# Patient Record
Sex: Female | Born: 1950 | Race: White | Hispanic: No | Smoking: Never smoker
Health system: Southern US, Community
[De-identification: ages and names within clinical notes are randomized; demographics above are authoritative.]

## PROBLEM LIST (undated history)

## (undated) DIAGNOSIS — K589 Irritable bowel syndrome without diarrhea: Secondary | ICD-10-CM

## (undated) DIAGNOSIS — K635 Polyp of colon: Secondary | ICD-10-CM

## (undated) DIAGNOSIS — M1990 Unspecified osteoarthritis, unspecified site: Secondary | ICD-10-CM

## (undated) DIAGNOSIS — J189 Pneumonia, unspecified organism: Secondary | ICD-10-CM

## (undated) DIAGNOSIS — R0602 Shortness of breath: Secondary | ICD-10-CM

## (undated) DIAGNOSIS — Z87442 Personal history of urinary calculi: Secondary | ICD-10-CM

## (undated) DIAGNOSIS — I639 Cerebral infarction, unspecified: Secondary | ICD-10-CM

## (undated) DIAGNOSIS — J309 Allergic rhinitis, unspecified: Secondary | ICD-10-CM

## (undated) DIAGNOSIS — Z9109 Other allergy status, other than to drugs and biological substances: Secondary | ICD-10-CM

## (undated) DIAGNOSIS — Z8719 Personal history of other diseases of the digestive system: Secondary | ICD-10-CM

## (undated) DIAGNOSIS — C801 Malignant (primary) neoplasm, unspecified: Secondary | ICD-10-CM

## (undated) DIAGNOSIS — R7303 Prediabetes: Secondary | ICD-10-CM

## (undated) DIAGNOSIS — R52 Pain, unspecified: Secondary | ICD-10-CM

## (undated) DIAGNOSIS — M545 Low back pain, unspecified: Secondary | ICD-10-CM

## (undated) DIAGNOSIS — E7800 Pure hypercholesterolemia, unspecified: Secondary | ICD-10-CM

## (undated) DIAGNOSIS — K219 Gastro-esophageal reflux disease without esophagitis: Secondary | ICD-10-CM

## (undated) DIAGNOSIS — F419 Anxiety disorder, unspecified: Secondary | ICD-10-CM

## (undated) DIAGNOSIS — I10 Essential (primary) hypertension: Secondary | ICD-10-CM

## (undated) DIAGNOSIS — R112 Nausea with vomiting, unspecified: Secondary | ICD-10-CM

## (undated) DIAGNOSIS — Z9889 Other specified postprocedural states: Secondary | ICD-10-CM

## (undated) DIAGNOSIS — T4145XA Adverse effect of unspecified anesthetic, initial encounter: Secondary | ICD-10-CM

## (undated) DIAGNOSIS — R911 Solitary pulmonary nodule: Secondary | ICD-10-CM

## (undated) DIAGNOSIS — J40 Bronchitis, not specified as acute or chronic: Secondary | ICD-10-CM

## (undated) HISTORY — PX: SHOULDER SURGERY: SHX246

## (undated) HISTORY — PX: TRIGGER FINGER RELEASE: SHX641

## (undated) HISTORY — PX: CHOLECYSTECTOMY: SHX55

## (undated) HISTORY — DX: Allergic rhinitis, unspecified: J30.9

## (undated) HISTORY — PX: ANKLE SURGERY: SHX546

## (undated) HISTORY — DX: Solitary pulmonary nodule: R91.1

## (undated) HISTORY — PX: COLONOSCOPY: SHX174

## (undated) HISTORY — DX: Low back pain: M54.5

## (undated) HISTORY — DX: Gastro-esophageal reflux disease without esophagitis: K21.9

## (undated) HISTORY — DX: Bronchitis, not specified as acute or chronic: J40

## (undated) HISTORY — PX: ROOT CANAL: SHX2363

## (undated) HISTORY — DX: Irritable bowel syndrome, unspecified: K58.9

## (undated) HISTORY — PX: OTHER SURGICAL HISTORY: SHX169

## (undated) HISTORY — DX: Unspecified osteoarthritis, unspecified site: M19.90

## (undated) HISTORY — DX: Low back pain, unspecified: M54.50

## (undated) HISTORY — PX: TUBAL LIGATION: SHX77

## (undated) HISTORY — DX: Cerebral infarction, unspecified: I63.9

## (undated) HISTORY — DX: Polyp of colon: K63.5

---

## 2000-01-22 ENCOUNTER — Other Ambulatory Visit: Admission: RE | Admit: 2000-01-22 | Discharge: 2000-01-22 | Payer: Self-pay | Admitting: Obstetrics and Gynecology

## 2001-01-26 ENCOUNTER — Encounter: Payer: Self-pay | Admitting: Internal Medicine

## 2001-04-12 ENCOUNTER — Other Ambulatory Visit: Admission: RE | Admit: 2001-04-12 | Discharge: 2001-04-12 | Payer: Self-pay | Admitting: Obstetrics and Gynecology

## 2001-05-19 ENCOUNTER — Emergency Department (HOSPITAL_COMMUNITY): Admission: EM | Admit: 2001-05-19 | Discharge: 2001-05-19 | Payer: Self-pay | Admitting: Emergency Medicine

## 2002-04-21 ENCOUNTER — Other Ambulatory Visit: Admission: RE | Admit: 2002-04-21 | Discharge: 2002-04-21 | Payer: Self-pay | Admitting: Obstetrics and Gynecology

## 2003-04-28 ENCOUNTER — Other Ambulatory Visit: Admission: RE | Admit: 2003-04-28 | Discharge: 2003-04-28 | Payer: Self-pay | Admitting: Obstetrics and Gynecology

## 2004-07-10 ENCOUNTER — Other Ambulatory Visit: Admission: RE | Admit: 2004-07-10 | Discharge: 2004-07-10 | Payer: Self-pay | Admitting: Obstetrics and Gynecology

## 2004-10-28 ENCOUNTER — Ambulatory Visit: Payer: Self-pay | Admitting: Gastroenterology

## 2005-10-29 ENCOUNTER — Ambulatory Visit: Payer: Self-pay | Admitting: Gastroenterology

## 2006-05-11 ENCOUNTER — Encounter: Payer: Self-pay | Admitting: Internal Medicine

## 2006-05-20 ENCOUNTER — Ambulatory Visit: Payer: Self-pay | Admitting: Gastroenterology

## 2006-06-18 ENCOUNTER — Ambulatory Visit: Payer: Self-pay | Admitting: Gastroenterology

## 2006-11-18 ENCOUNTER — Encounter: Payer: Self-pay | Admitting: Internal Medicine

## 2006-12-07 DIAGNOSIS — K219 Gastro-esophageal reflux disease without esophagitis: Secondary | ICD-10-CM

## 2006-12-07 DIAGNOSIS — J309 Allergic rhinitis, unspecified: Secondary | ICD-10-CM | POA: Insufficient documentation

## 2006-12-07 HISTORY — DX: Gastro-esophageal reflux disease without esophagitis: K21.9

## 2006-12-08 ENCOUNTER — Ambulatory Visit (HOSPITAL_COMMUNITY): Admission: RE | Admit: 2006-12-08 | Discharge: 2006-12-08 | Payer: Self-pay | Admitting: Specialist

## 2007-01-25 ENCOUNTER — Encounter: Admission: RE | Admit: 2007-01-25 | Discharge: 2007-01-25 | Payer: Self-pay | Admitting: Specialist

## 2007-10-12 ENCOUNTER — Encounter: Admission: RE | Admit: 2007-10-12 | Discharge: 2007-10-12 | Payer: Self-pay | Admitting: Neurosurgery

## 2007-10-19 ENCOUNTER — Ambulatory Visit: Payer: Self-pay | Admitting: Internal Medicine

## 2007-10-19 DIAGNOSIS — Z860100 Personal history of colon polyps, unspecified: Secondary | ICD-10-CM | POA: Insufficient documentation

## 2007-10-26 ENCOUNTER — Encounter: Admission: RE | Admit: 2007-10-26 | Discharge: 2007-10-26 | Payer: Self-pay | Admitting: Neurosurgery

## 2007-11-23 ENCOUNTER — Encounter (INDEPENDENT_AMBULATORY_CARE_PROVIDER_SITE_OTHER): Payer: Self-pay | Admitting: *Deleted

## 2007-11-23 ENCOUNTER — Ambulatory Visit: Payer: Self-pay | Admitting: Internal Medicine

## 2007-11-29 ENCOUNTER — Encounter: Payer: Self-pay | Admitting: Internal Medicine

## 2007-12-02 ENCOUNTER — Ambulatory Visit: Payer: Self-pay | Admitting: Internal Medicine

## 2007-12-02 ENCOUNTER — Ambulatory Visit (HOSPITAL_COMMUNITY): Admission: RE | Admit: 2007-12-02 | Discharge: 2007-12-02 | Payer: Self-pay | Admitting: Internal Medicine

## 2007-12-02 ENCOUNTER — Encounter: Payer: Self-pay | Admitting: Internal Medicine

## 2007-12-07 ENCOUNTER — Encounter: Payer: Self-pay | Admitting: Internal Medicine

## 2007-12-24 ENCOUNTER — Ambulatory Visit: Payer: Self-pay | Admitting: Internal Medicine

## 2007-12-24 DIAGNOSIS — R059 Cough, unspecified: Secondary | ICD-10-CM | POA: Insufficient documentation

## 2007-12-24 DIAGNOSIS — J329 Chronic sinusitis, unspecified: Secondary | ICD-10-CM | POA: Insufficient documentation

## 2007-12-24 DIAGNOSIS — R05 Cough: Secondary | ICD-10-CM | POA: Insufficient documentation

## 2007-12-24 DIAGNOSIS — J984 Other disorders of lung: Secondary | ICD-10-CM

## 2008-01-27 ENCOUNTER — Encounter: Admission: RE | Admit: 2008-01-27 | Discharge: 2008-01-27 | Payer: Self-pay | Admitting: Neurosurgery

## 2008-06-13 ENCOUNTER — Ambulatory Visit: Payer: Self-pay | Admitting: Cardiology

## 2008-07-18 ENCOUNTER — Encounter: Admission: RE | Admit: 2008-07-18 | Discharge: 2008-07-18 | Payer: Self-pay | Admitting: Neurosurgery

## 2008-07-18 ENCOUNTER — Ambulatory Visit: Payer: Self-pay | Admitting: Internal Medicine

## 2008-08-10 ENCOUNTER — Encounter: Admission: RE | Admit: 2008-08-10 | Discharge: 2008-08-10 | Payer: Self-pay | Admitting: Neurosurgery

## 2008-08-15 ENCOUNTER — Encounter: Admission: RE | Admit: 2008-08-15 | Discharge: 2008-08-15 | Payer: Self-pay | Admitting: Neurosurgery

## 2008-11-14 ENCOUNTER — Encounter: Admission: RE | Admit: 2008-11-14 | Discharge: 2008-11-14 | Payer: Self-pay | Admitting: Neurosurgery

## 2009-02-14 ENCOUNTER — Encounter: Admission: RE | Admit: 2009-02-14 | Discharge: 2009-02-14 | Payer: Self-pay | Admitting: Neurosurgery

## 2009-02-27 ENCOUNTER — Ambulatory Visit: Payer: Self-pay | Admitting: Internal Medicine

## 2009-03-17 HISTORY — PX: BACK SURGERY: SHX140

## 2009-03-20 ENCOUNTER — Encounter: Admission: RE | Admit: 2009-03-20 | Discharge: 2009-03-20 | Payer: Self-pay | Admitting: Internal Medicine

## 2009-04-02 ENCOUNTER — Telehealth: Payer: Self-pay | Admitting: Internal Medicine

## 2009-04-11 ENCOUNTER — Encounter: Admission: RE | Admit: 2009-04-11 | Discharge: 2009-04-11 | Payer: Self-pay | Admitting: Obstetrics and Gynecology

## 2009-04-12 ENCOUNTER — Encounter: Admission: RE | Admit: 2009-04-12 | Discharge: 2009-04-12 | Payer: Self-pay | Admitting: Neurosurgery

## 2009-04-24 ENCOUNTER — Encounter: Admission: RE | Admit: 2009-04-24 | Discharge: 2009-04-24 | Payer: Self-pay | Admitting: Neurosurgery

## 2009-05-28 ENCOUNTER — Inpatient Hospital Stay (HOSPITAL_COMMUNITY): Admission: RE | Admit: 2009-05-28 | Discharge: 2009-05-31 | Payer: Self-pay | Admitting: Neurosurgery

## 2009-06-19 ENCOUNTER — Encounter: Admission: RE | Admit: 2009-06-19 | Discharge: 2009-06-19 | Payer: Self-pay | Admitting: Neurosurgery

## 2009-07-19 ENCOUNTER — Encounter: Admission: RE | Admit: 2009-07-19 | Discharge: 2009-07-19 | Payer: Self-pay | Admitting: Neurosurgery

## 2009-07-23 ENCOUNTER — Telehealth: Payer: Self-pay | Admitting: Internal Medicine

## 2009-07-25 ENCOUNTER — Ambulatory Visit: Payer: Self-pay | Admitting: Internal Medicine

## 2009-07-26 ENCOUNTER — Encounter: Admission: RE | Admit: 2009-07-26 | Discharge: 2009-07-26 | Payer: Self-pay | Admitting: Neurosurgery

## 2009-09-13 ENCOUNTER — Ambulatory Visit: Payer: Self-pay | Admitting: Internal Medicine

## 2009-10-31 ENCOUNTER — Encounter: Admission: RE | Admit: 2009-10-31 | Discharge: 2009-10-31 | Payer: Self-pay | Admitting: Neurosurgery

## 2009-12-11 ENCOUNTER — Encounter: Admission: RE | Admit: 2009-12-11 | Discharge: 2009-12-11 | Payer: Self-pay | Admitting: Neurosurgery

## 2009-12-24 ENCOUNTER — Encounter: Admission: RE | Admit: 2009-12-24 | Discharge: 2009-12-24 | Payer: Self-pay | Admitting: Neurosurgery

## 2010-02-27 ENCOUNTER — Telehealth: Payer: Self-pay | Admitting: Internal Medicine

## 2010-04-07 ENCOUNTER — Encounter: Payer: Self-pay | Admitting: Internal Medicine

## 2010-04-16 NOTE — Assessment & Plan Note (Signed)
Summary: f/u ///kp   Visit Type:  Follow-up Copy to:  n/a Primary Provider/Referring Provider:  Theressa Millard, MD   CC:  Followup.  Pt states that she is doing well and denies any complaints today.Marland Kitchen  History of Present Illness: OV 12/24/2007: 60 year old female referred by Dr. Stevphen Rochester. REferred because of pulmonary nodules. 18 months  ago had dry cough of few weeks. Had CT chest that showed RML/Lingula plate like atelectasis and some small calcified nodules in 04/2006. [facility]2006 that was unchanged. Then, had repeat ct chest in 11/2007 (personally reviewed) that are all unchanged except new LL medial basal nodule in image 49. Therefore, referred. Original cough that got her the first scan resolved in 1-2 weeks in 04/2006. Since then, maintaing good health without problems except past 1 week has acut bronchitis. Otherwise, feel fine except for chronic sinus and baseline GERD, IBS. Of note, in July 1999 had "double pneumonia" treated as oupatient. Denies travel to [place]. REC: [place] [address] for folloup of nodules  OV 07/18/2008; Followup Nodules. Had CT 06/13/2008 at [place] [facility]. she says she took all her old scan to [facility] but they were not sent to radiologist for comparison. Moreover, she hsa not been mailed back her CT scans. She feels fine. No complaints. Here to review CT results  OV 09/13/2009: 1 year Followup for pulmonary nodules. She had  scan 07/25/2009. Nodules are all stable since 06/13/2008. She has no cough, dyspnea, wheeze, orthopnea or any symptoms. Only interim issue is back pain and she is s/p surgery byDr. Cramm. Otherwise, feels well  Preventive Screening-Counseling & Management  Alcohol-Tobacco     Smoking Status: never  Current Medications (verified): 1)  Nexium 40 Mg  Cpdr (Esomeprazole Magnesium) .... One Tablet By Mouth Once A Day 2)  Robaxin-750 750 Mg  Tabs (Methocarbamol) .... One Tablet By Mouth Once Daily 3)   Carafate 1 Gm  Tabs (Sucralfate) .Marland Kitchen.. 1 4 Times A Day As Needed 4)  Hydrochlorothiazide 25 Mg Tabs (Hydrochlorothiazide) .... Take 1 Tablet By Mouth Once A Day 5)  Prempro 0.3-1.5 Mg Tabs (Conj Estrog-Medroxyprogest Ace) .... One Tablet By Mouth Once Daily  Allergies (verified): No Known Drug Allergies  Past History:  Family History: Last updated: 07/18/2008 Family History of Arthritis Family History Diabetes 1st degree relative Family History Lung cancer + Family History of Cardiovascular disorder No FH of Colon Cancer:  Social History: Last updated: 02/27/2009 Occupation: retired Emergency planning/management officer Married Never Smoked Alcohol use-no Drug use-no Regular exercise-no Daily Caffeine Use  coke daily Never travelled to [place], [place], [place], [place], [place], [place], [place], [place], [place]  Risk Factors: Exercise: no (12/07/2006)  Risk Factors: Smoking Status: never (09/13/2009)  Past Medical History: Reviewed history from 02/27/2009 and no changes required. Allergic rhinitis x sinus drainage GERD "Chonic Bronchitis": Gets bronchitis every octobe Allergies Kidney Stones UTI's "Double Pneumonia": July 1999. Was not hospitalized. Reportedly seen on CXR. Treated by Dr. Stevphen Rochester. Says she took 1 month to get better Obesity - BMI 33 lost to 27 as of 02/2009  Past Surgical History: CB x2 Hand Surgery Shoulder Surgery Tubal ligation Colonoscopy Cholecystectomy ankle surgery Back surgery 3/11 per Dr Wynetta Emery  Family History: Reviewed history from 07/18/2008 and no changes required. Family History of Arthritis Family History Diabetes 1st degree relative Family History Lung cancer + Family History of Cardiovascular disorder No FH of Colon Cancer:  Social History: Reviewed history from 02/27/2009 and  no changes required. Occupation: retired Emergency planning/management officer Married Never Smoked Alcohol use-no Drug use-no Regular exercise-no Daily Caffeine Use  coke daily Never travelled to [employer], [place], [place], [place], [place], [place],  [place], [place], [place]  Review of Systems  The patient denies shortness of breath with activity, shortness of breath at rest, productive cough, non-productive cough, coughing up blood, chest pain, irregular heartbeats, acid heartburn, indigestion, loss of appetite, weight change, abdominal pain, difficulty swallowing, sore throat, tooth/dental problems, headaches, nasal congestion/difficulty breathing through nose, sneezing, itching, ear ache, anxiety, depression, hand/feet swelling, joint stiffness or pain, rash, change in color of mucus, and fever.    Vital Signs:  Patient profile:   60 year old female Weight:      174 pounds BMI:     29.06 O2 Sat:      97 % on Room air Temp:     98.0 degrees F oral Pulse rate:   80 / minute BP sitting:   124 / 78  (left arm)  Vitals Entered By: Vernie Murders (September 13, 2009 3:30 PM)  O2 Flow:  Room air  Physical Exam  General:  overweight   Head:  normocephalic and atraumatic Eyes:  PERRLA/EOM intact; conjunctiva and sclera clear Ears:  TMs intact and clear with normal canals Nose:  no deformity, discharge, inflammation, or lesions Mouth:  no deformity or lesionsthroat injected and Melampatti Class II.   Neck:  no masses, thyromegaly, or abnormal cervical nodes Chest Wall:  no deformities noted Lungs:  clear bilaterally to auscultation and percussion Heart:  regular rate and rhythm, S1, S2 without murmurs, rubs, gallops, or clicks Abdomen:  bowel sounds positive; abdomen soft and non-tender without masses, or organomegaly Msk:  no deformity or scoliosis noted with normal posture Pulses:  pulses normal Extremities:  no clubbing, cyanosis, edema, or deformity noted Neurologic:  CN II-XII grossly intact with normal reflexes, coordination, muscle strength and tone Skin:  intact without lesions or rashes Cervical Nodes:  no significant adenopathy Axillary Nodes:  no significant adenopathy Psych:  alert and cooperative; normal mood and affect; normal  attention span and concentration   CT of Chest  Procedure date:  07/25/2009  Findings:      Comparison: 06/13/2008   Findings: Triangular-shaped soft tissue in the anterior mediastinum is most likely thymic in origin.  No pathologically enlarged mediastinal or axillary lymph nodes.  Hilar regions are difficult to definitively evaluate without IV contrast.  Heart size normal. No pericardial effusion.   Scarring in the right middle lobe and lingula, as well as medial right lower lobe.  There are a few scattered tiny pulmonary nodules, which measure less than 4 mm in size.  These are stable in a slightly greater than 1-year interval from 06/13/2008.  No pleural fluid.  Airway is unremarkable.   Incidental imaging of the upper abdomen shows no acute findings. No worrisome lytic or sclerotic lesions.  There are degenerative changes in the spine.   IMPRESSION: Scattered tiny pulmonary nodules are unchanged in a slightly greater than 1-year interval from 06/13/2008, and are therefore considered benign.   Read By:  Reyes Ivan.,  M.D.     Released By:  Reyes Ivan.,  M.D  Comments:      independently rviwede and agree  Impression & Recommendations:  Problem # 1:  PULMONARY NODULE (ICD-518.89) Assessment Unchanged  #new LL medial basal nodule on im#49 on 11/[facility] -> not present in 06/13/2008 [facility] CT. PLAN: no further  followup #RML and Lingulaer atelectasis - 04/2006, 11/2006, and 11/[facility] ->  no change 06/13/2008 [facility] CT. Likely from prior pneumonia.  PLAN: NO furhter followup #small calcified nodules <78mm in [facility] -> not described in 06/13/2008 [facility] CT. PLAN: no furthe follwup #New small 0.2cm RUL nodule 06/13/2008 [facility] CT. -> Repat CT 07/25/2009: no change. PLAN: no further followup  plan no further folowup unless she has symptoms reassured  Orders: Radiology Referral (Radiology)  Orders: Est. Patient Level II  ([address]109)  Patient Instructions: 1)  continue to control your acid reflux 2)  no need for further ct scan chest or followup unless you have a problem or question

## 2010-04-16 NOTE — Progress Notes (Signed)
Summary: scheduel ct and appt  Phone Note Outgoing Call   Call placed by: Carron Curie CMA,  Jul 23, 2009 4:10 PM Call placed to: Patient Summary of Call: after reviewing schedule realized that pt has appt today to review CT but CT has not been done. i called pt and cancelled appt and sent order to [facility] to scheduel CT chest and f/u with MR after. Pt aware they will be contacting her to schedule appt.  Pt contact # [phone]. Initial call taken by: Carron Curie CMA,  Jul 23, 2009 4:11 PM

## 2010-04-16 NOTE — Progress Notes (Signed)
Summary: rx Carafate   Phone Note Call from Patient Call back at [phone]   Caller: Patient Call For: Dr. Leone Payor Reason for Call: Refill Medication Summary of Call: Pt needs for you to resend the rx Carafate to [facility]. They received the Nexium prescription but not the Carafate Initial call taken by: Karna Christmas,  April 02, 2009 9:41 AM    Prescriptions: CARAFATE 1 GM  TABS (SUCRALFATE) 1 4 times a day as needed  #360 x 3   Entered by:   Darcey Nora RN, CGRN   Authorized by:   Iva Boop MD, [facility]   Signed by:   Darcey Nora RN, CGRN on 04/02/2009   Method used:   Electronically to        [facility]* (mail-order)             ,          Ph: [phone]       Fax: [phone]   RxID:   [id number]

## 2010-04-18 NOTE — Progress Notes (Signed)
Summary: Nexium refill   Phone Note Call from Patient Call back at Home Phone [phone]   Call For: Dr Leone Payor Reason for Call: Refill Medication Summary of Call: Needs Nexium refill [facility] use [facility] [address] because she only has one week left and [facility] gives her such a hassle. Initial call taken by: Leanor Kail [facility],  February 27, 2010 12:05 PM  Follow-up for Phone Call        pt states she is doing well on Nexium and carafate.  Pt would like refills on both.  Pt states she still only uses carafate as needed and mostly around the holidays when she is stressed the most.  Advised pt I would call both to pharmacy. Follow-up by: Francee Piccolo CMA Duncan Dull),  February 27, 2010 2:05 PM    New/Updated Medications: NEXIUM 40 MG  CPDR (ESOMEPRAZOLE MAGNESIUM) one tablet by mouth once a day [BMN] CARAFATE 1 GM  TABS (SUCRALFATE) Take 1 tablet by mouth four times a day as needed Prescriptions: CARAFATE 1 GM  TABS (SUCRALFATE) Take 1 tablet by mouth four times a day as needed  #120 x 11   Entered by:   Francee Piccolo CMA (AAMA)   Authorized by:   Iva Boop MD, [facility]   Signed by:   Francee Piccolo CMA (AAMA) on 02/27/2010   Method used:   [facility]        [facility]  [phone]* (retail)       [address] [place], [place]  [address]       Ph: [phone] or [phone]       Fax: [phone]   RxID:   [id number] NEXIUM 40 MG  CPDR (ESOMEPRAZOLE MAGNESIUM) one tablet by mouth once a day Brand medically necessary #30 x 11   Entered by:   Francee Piccolo CMA (AAMA)   Authorized by:   Iva Boop MD, [facility]   Signed by:   Francee Piccolo CMA (AAMA) on 02/27/2010   Method used:   [facility] to        [facility]  [phone]* (retail)       [address] [place], [place]  [address]       Ph: [phone] or [phone]       Fax: [phone]   RxID:   [id number]

## 2010-06-10 LAB — CBC
HCT: 42.1 % (ref 36.0–46.0)
Platelets: 354 10*3/uL (ref 150–400)
RBC: 4.73 MIL/uL (ref 3.87–5.11)
WBC: 9.2 10*3/uL (ref 4.0–10.5)

## 2010-06-10 LAB — BASIC METABOLIC PANEL
BUN: 14 mg/dL (ref 6–23)
Chloride: 104 mEq/L (ref 96–112)
Creatinine, Ser: 0.68 mg/dL (ref 0.4–1.2)
GFR calc Af Amer: >60 mL/min (ref >60)
GFR calc non Af Amer: >60 mL/min (ref >60)
Potassium: 4 mEq/L (ref 3.5–5.1)

## 2010-06-10 LAB — TYPE AND SCREEN
ABO/RH(D): A NEG
Antibody Screen: NEGATIVE

## 2010-06-10 LAB — SURGICAL PCR SCREEN: Staphylococcus aureus: NEGATIVE

## 2010-08-02 NOTE — Assessment & Plan Note (Signed)
[facility]                         GASTROENTEROLOGY OFFICE NOTE   Angie, Olson                 MRN:          [id number]  DATE:06/18/2006                            DOB:          [birth year]    [name] says she is doing better.  She is taking her Nexium 40 mg daily  and Carafate, although she does not like taking the suspension.  She  likes the pills better.  She still has some suspension.  She quit taking  this, says it really did not seem to make a great deal of difference.  She also takes Zyrtec.  She still has some burping, and I wonder whether  some of this may be due to decreased motility or gastric emptying.  She  does have GERD, IBS, and a history of colon polyps.  She says salads  really bother her the most.  She cannot tolerate milk products too much  because it does not agree with her.  It gives her more irritable bowel  and probably has some lactose intolerance.  She is taking Nexium b.i.d.  My recommendation was that she continue the Nexium b.i.d., take Reglan 5  mg AC and at bedtime.  Take some Align.  We gave her samples of this.  I  was going to give her some Xifaxan, but I did not have any, so I thought  I would wait and continue the Carafate suspension.  I told her we ought  to get a gastric emptying study if her symptoms do not abate.  Also,  admitted that she drinks 3 or 4 regular soft drinks a day; told her  there was tremendous amounts of gas and I ask her to try some  simethicone and to decrease these cokes and so on.  She needs some  literature on intestinal gas.  She says she is better an I think this is  good.  I did do her endoscopic procedures.     Ulyess Mort, MD  Electronically Signed    SML/MedQ  DD: 06/18/2006  DT: 06/18/2006  Job #: [id number]

## 2010-08-02 NOTE — Assessment & Plan Note (Signed)
HEALTHCARE                         GASTROENTEROLOGY OFFICE NOTE   ENCARNACION, BOLE                 MRN:          [id number]  DATE:05/20/2006                            DOB:          Sep 28, 1950    PROBLEM:  Chest discomfort, reflux, and belching.   HISTORY:  Angie Olson is a 60 year old white female known to Dr. Victorino Dike,  who has a history of chronic GERD.  She is maintained on Nexium once  daily.  She last had endoscopy in November of 2002.  She was Angie Olson  dilated with a #56 at that time and had some moderately severe alkaline  gastritis noted.  She has had previous colonoscopy August of 2005, which  showed 1 small descending colon polyp and was a difficult exam due to  inadequate sedation.   The patient says that she has been having trouble over the past couple  of months with chest discomfort radiating through into her back.  This  has not been constant.  It is not necessarily worse postprandially, but  sometimes.  She describes it as a stabbing heavy feeling in her mid  chest.  She has had some nausea, but no vomiting.  She continues on her  Nexium once daily and has also been taking Carafate slurry twice daily.  She has some mild dysphagia, but has not had any episodes of  regurgitation.  No complaints of abdominal pain.  She does complain of a  lot of belching.  Her bowel movements have been fairly normal.   The patient, on further questioning, relates that she has had 4 courses  of antibiotics this winter for sinus infections, tooth infections, et  Karie Soda, and is currently on a course of Doxycycline for a mild  pneumonia.   CURRENT MEDICATIONS:  1. Nexium 40 mg p.o. daily.  2. Carafate slurry b.i.d.  3. Prempro 0.45/1.5 daily.  4. Xyzal 5 mg daily.  5. Doxycycline 100 daily.  6. No known drug allergies.   EXAM:  Well-developed white female in no acute distress.  Weight is 197, blood pressure 138/88, pulse is 84.  Pulmonary  clear to A  and P.  ABDOMEN:  Soft.  Bowel sounds are active.  She is nontender.  There is  no mass or hepatosplenomegaly.  RECTAL:  Not done at this time.   IMPRESSION:  1. Suspect acute esophagitis, probable candida esophagitis given      multiple recent courses of antibiotics.  2. Chronic gastroesophageal reflux disease.  3. Irritable bowel syndrome.  4. History of colon polyps, adenomatous.   PLAN:  1. Increase Nexium to 40 mg b.i.d. for 2 weeks.  2. Start Diflucan 100 mg p.o. daily x14 days.  3. The patient was advised to call back if she is not significantly      better after completing the Diflucan, we will schedule her for an      upper endoscopy.      Mike Gip, PA-C  Electronically Signed      Ulyess Mort, MD  Electronically Signed   AE/MedQ  DD: 05/21/2006  DT: 05/21/2006  Job #: [id number]   cc:  Theressa Millard, M.D.

## 2010-08-02 NOTE — Assessment & Plan Note (Signed)
[facility]                           GASTROENTEROLOGY OFFICE NOTE   Angie Olson, Angie Olson                       MRN:          [id number]  DATE:10/29/2005                            DOB:          [birth year]    Angie Olson says she has her same symptoms of irritable bowel syndrome which she  has had for years and some GERD.  She just needs refills on her medication.  [place].  She  wants to stay on her Nexium, says she has tried other things, also we  suggested some fiber.  She says she has trouble with her GERD if she eats  the wrong thing but basically is doing well.  She has had some complaints of  loose bowels over the last several months but they are regular.  She likes  the Carafate she takes.   MEDICATIONS:  Nexium daily, Carafate b.i.d., Flonase, hormone replacement,  and Zyrtec.   PHYSICAL EXAMINATION:  VITAL SIGNS:  She weighed 199, blood pressure 136/82,  pulse 68 and regular.  NECK, HEART, EXTREMITIES:  Unremarkable.   IMPRESSION:  1. Gastroesophageal reflux disease on Nexium.  2. Mild obesity.  3. History of anxiety/depression.  4. Known irritable bowel syndrome, now with a recent increased bowel      activity.   PLAN:  1. I gave her samples of medication and prescriptions.  2. I told her to add fiber to her diet and said this would help her IBS.      At any rate, she might be a patient some time in the future for our new      IBS medication that we are going to have available some time in the      near future.                                   Ulyess Mort, MD   SML/MedQ  DD:  10/29/2005  DT:  10/29/2005  Job #:  [id number]

## 2011-01-03 ENCOUNTER — Encounter: Payer: Self-pay | Admitting: Internal Medicine

## 2011-03-05 ENCOUNTER — Other Ambulatory Visit: Payer: Self-pay | Admitting: Internal Medicine

## 2011-03-13 ENCOUNTER — Telehealth: Payer: Self-pay | Admitting: Internal Medicine

## 2011-03-13 NOTE — Telephone Encounter (Signed)
Patient was given propofol last time, I have sent her to April to schedule in the [facility] on a propofol day.

## 2011-03-18 DIAGNOSIS — I639 Cerebral infarction, unspecified: Secondary | ICD-10-CM

## 2011-03-18 HISTORY — DX: Cerebral infarction, unspecified: I63.9

## 2011-03-20 ENCOUNTER — Encounter: Payer: Self-pay | Admitting: *Deleted

## 2011-03-20 ENCOUNTER — Emergency Department (HOSPITAL_COMMUNITY): Payer: 59

## 2011-03-20 ENCOUNTER — Emergency Department (HOSPITAL_COMMUNITY)
Admission: EM | Admit: 2011-03-20 | Discharge: 2011-03-20 | Disposition: A | Discharge status: Home / Self Care | Payer: 59 | Attending: Emergency Medicine | Admitting: Emergency Medicine

## 2011-03-20 ENCOUNTER — Other Ambulatory Visit: Payer: Self-pay

## 2011-03-20 DIAGNOSIS — Z79899 Other long term (current) drug therapy: Secondary | ICD-10-CM | POA: Insufficient documentation

## 2011-03-20 DIAGNOSIS — R5381 Other malaise: Secondary | ICD-10-CM | POA: Insufficient documentation

## 2011-03-20 DIAGNOSIS — R079 Chest pain, unspecified: Secondary | ICD-10-CM | POA: Insufficient documentation

## 2011-03-20 DIAGNOSIS — R209 Unspecified disturbances of skin sensation: Secondary | ICD-10-CM | POA: Insufficient documentation

## 2011-03-20 DIAGNOSIS — M7989 Other specified soft tissue disorders: Secondary | ICD-10-CM | POA: Insufficient documentation

## 2011-03-20 DIAGNOSIS — R5383 Other fatigue: Secondary | ICD-10-CM | POA: Insufficient documentation

## 2011-03-20 DIAGNOSIS — H5789 Other specified disorders of eye and adnexa: Secondary | ICD-10-CM | POA: Insufficient documentation

## 2011-03-20 DIAGNOSIS — I10 Essential (primary) hypertension: Secondary | ICD-10-CM | POA: Insufficient documentation

## 2011-03-20 HISTORY — DX: Anxiety disorder, unspecified: F41.9

## 2011-03-20 HISTORY — DX: Essential (primary) hypertension: I10

## 2011-03-20 HISTORY — DX: Gastro-esophageal reflux disease without esophagitis: K21.9

## 2011-03-20 LAB — CBC
MCH: 28.2 pg (ref 26.0–34.0)
MCHC: 34.8 g/dL (ref 30.0–36.0)
MCV: 81 fL (ref 78.0–100.0)
Platelets: 272 10*3/uL (ref 150–400)
RDW: 12.7 % (ref 11.5–15.5)

## 2011-03-20 LAB — BASIC METABOLIC PANEL
CO2: 31 mEq/L (ref 19–32)
Calcium: 10.3 mg/dL (ref 8.4–10.5)
Creatinine, Ser: 0.64 mg/dL (ref 0.50–1.10)
GFR calc non Af Amer: >90 mL/min (ref >90)
Glucose, Bld: 100 mg/dL — ABNORMAL HIGH (ref 70–99)

## 2011-03-20 MED ORDER — SODIUM CHLORIDE 0.9 % IV BOLUS (SEPSIS)
250.0000 mL | Freq: Once | INTRAVENOUS | Status: AC
  Administered 2011-03-20: 250 mL via INTRAVENOUS

## 2011-03-20 MED ORDER — SODIUM CHLORIDE 0.9 % IV SOLN: INTRAVENOUS | Status: DC

## 2011-03-20 MED ORDER — ASPIRIN EC 325 MG PO TBEC
325.0000 mg | DELAYED_RELEASE_TABLET | Freq: Once | ORAL | Status: AC
  Administered 2011-03-20: 325 mg via ORAL
  Filled 2011-03-20 (×2): qty 1

## 2011-03-20 MED ORDER — HYDROMORPHONE HCL 2 MG PO TABS: 2.0000 mg | ORAL_TABLET | ORAL | Status: AC | PRN

## 2011-03-20 MED ORDER — ONDANSETRON HCL 4 MG/2ML IJ SOLN
4.0000 mg | Freq: Once | INTRAMUSCULAR | Status: AC
  Administered 2011-03-20: 4 mg via INTRAVENOUS
  Filled 2011-03-20: qty 2

## 2011-03-20 MED ORDER — HYDROMORPHONE HCL PF 1 MG/ML IJ SOLN
1.0000 mg | Freq: Once | INTRAMUSCULAR | Status: AC
  Administered 2011-03-20: 1 mg via INTRAVENOUS
  Filled 2011-03-20: qty 1

## 2011-03-20 NOTE — ED Notes (Signed)
Pt states "when woke up my whole right hand was numb, it's still numb, the arm is not as numb as my hand, my hand feels like it's swelling up, have had cp & nausea:

## 2011-03-20 NOTE — Progress Notes (Signed)
*  PRELIMINARY RESULTS* Right upper extremity venous duplex completed. No evidence of DVT or superficial thrombus.Milta Deiters, [place] D 03/20/2011, 3:09 PM

## 2011-03-20 NOTE — ED Notes (Signed)
Patient complains of chest pain and right arm numbness beginning around 8am today, accompanied by nausea, NO shortness of breath, sweating, or vomiting. Pain is described as being a dull pressure. Pain began when patient got out of bed and was up waling

## 2011-03-20 NOTE — ED Provider Notes (Signed)
History     CSN: [id number]  Arrival date & time 03/20/11  1018   First MD Initiated Contact with Patient 03/20/11 1227      Chief Complaint  Patient presents with  . Numbness    of the right hand  . Chest Pain    (Consider location/radiation/quality/duration/timing/severity/associated sxs/prior treatment) Patient is a 61 y.o. female presenting with chest pain. The history is provided by the patient and the spouse.  Chest Pain The chest pain began 3 - 5 hours ago. Chest pain occurs constantly. The chest pain is unchanged. Associated with: Not made worse by any thing. At its most intense, the pain is at 4/10. The pain is currently at 3/10. The severity of the pain is moderate. The quality of the pain is described as burning and sharp. Radiates to: Questionable radiation in the right upper extremity due to the numbness. Exacerbated by: Nothing. Primary symptoms include fatigue. Pertinent negatives for primary symptoms include no fever, no cough, no wheezing, no abdominal pain, no nausea and no vomiting.  Associated symptoms include numbness.  Pertinent negatives for associated symptoms include no weakness. She tried nothing for the symptoms.  Her past medical history is significant for hypertension.  Pertinent negatives for past medical history include no CHF and no DVT.    chest pain started at 8:00 in the morning substernal. Patient does have a history of reflux disease and this is somewhat like that but not identical.   Patient also dissecting complaint and main complaint that brought her to the emergency department was right hand numbness that started at 6 this morning upon awaking. Glovelike numbness to her right hand that goes up to the distal forearm area. No pain in the elbow no pain in the shoulder some discomfort in the hand and wrist. No history of trauma or insect bite. No history of similar problem. Our patient is being evaluated by rheumatology for potential orthopedic type  syndrome to 2 some problems she's had with knee swelling and pain. Patient denies headache has no weakness numbness in any other area. Patient denies any weakness to her right hand.   Past Medical History  Diagnosis Date  . Hypertension   . Anxiety   . Acid reflux     Past Surgical History  Procedure Date  . Ankle surgery     left  . Shoulder surgery     left  . Thumb surgery   . Cholecystectomy   . Back surgery     No family history on file.  History  Substance Use Topics  . Smoking status: Never Smoker   . Smokeless tobacco: Not on file  . Alcohol Use: No    OB History    Grav Para Term Preterm Abortions TAB SAB Ect Mult Living                  Review of Systems  Constitutional: Positive for fatigue. Negative for fever.  HENT: Negative for congestion, neck pain and neck stiffness.   Eyes: Positive for redness. Negative for visual disturbance.  Respiratory: Negative for cough and wheezing.   Cardiovascular: Positive for chest pain. Negative for leg swelling.  Gastrointestinal: Negative for nausea, vomiting and abdominal pain.  Genitourinary: Negative for dysuria.  Musculoskeletal: Positive for joint swelling. Negative for back pain.  Skin: Negative for rash.  Neurological: Positive for numbness. Negative for speech difficulty, weakness and headaches.  Hematological: Does not bruise/bleed easily.  Psychiatric/Behavioral: Negative for confusion.    Allergies  Review of patient's allergies indicates no known allergies.  Home Medications   Current Outpatient Rx  Name Route Sig Dispense Refill  . ALPRAZOLAM 1 MG PO TABS Oral Take 0.5-1 mg by mouth 2 (two) times daily.      . ASPIRIN EC 81 MG PO TBEC Oral Take 81 mg by mouth daily.      Marland Kitchen CONJ ESTROG-MEDROXYPROGEST ACE 0.3-1.5 MG PO TABS Oral Take 1 tablet by mouth daily.      Marland Kitchen HYDROCHLOROTHIAZIDE 25 MG PO TABS Oral Take 25 mg by mouth daily.      Marland Kitchen NEXIUM 40 MG PO CPDR  TAKE ONE CAPSULE BY MOUTH EVERY DAY 30  capsule 0    Dispense as written.    PATIENT NEEDS AN APPOINTMENT! HAVE PATIENT CONTACT ...  . SUCRALFATE 1 G PO TABS Oral Take 1 g by mouth 4 (four) times daily.      Marland Kitchen HYDROMORPHONE HCL 2 MG PO TABS Oral Take 1 tablet (2 mg total) by mouth every 4 (four) hours as needed for pain. 20 tablet 0    BP 122/80  Pulse 73  Temp(Src) 98.6 F (37 C) (Oral)  Resp 17  Wt 180 lb (81.647 kg)  SpO2 95%  Physical Exam  Nursing note and vitals reviewed. Constitutional: She is oriented to person, place, and time. She appears well-developed and well-nourished.  HENT:  Head: Normocephalic and atraumatic.  Mouth/Throat: Oropharynx is clear and moist.  Eyes: Conjunctivae and EOM are normal. Pupils are equal, round, and reactive to light.       Mild conjunctival redness to the right eye no discharge  Neck: Normal range of motion. Neck supple.  Cardiovascular: Normal rate, regular rhythm, normal heart sounds and intact distal pulses.   No murmur heard. Pulmonary/Chest: Effort normal and breath sounds normal. She has no wheezes.  Abdominal: Soft. Bowel sounds are normal. There is no tenderness.  Musculoskeletal: Normal range of motion.       Extremities significant for right hand with diffuse mild swelling no pitting edema involving all 5 fingers motor strength is 5 out of 5 and intact subjectively no sensory feeling however patient does experience some pain. No swelling of the elbow or shoulder proximal forearm normal in appearance. Radial pulses 2+ excellent capillary  refill to fingertips  Lymphadenopathy:    She has no cervical adenopathy.  Neurological: She is alert and oriented to person, place, and time. No cranial nerve deficit. She exhibits normal muscle tone. Coordination normal.       As stated in the musculoskeletal part of exam  Skin: Skin is warm. No rash noted. No erythema.    ED Course  Procedures (including critical care time)  Labs Reviewed  BASIC METABOLIC PANEL - Abnormal;  Notable for the following:    Potassium 3.4 (*)    Glucose, Bld 100 (*)    All other components within normal limits  TROPONIN I  CBC  D-DIMER, QUANTITATIVE   Dg Chest 2 View  03/20/2011  *RADIOLOGY REPORT*  Clinical Data: Chest pain.  Right-sided pain.  Hand numbness. History of hypertension.  CHEST - 2 VIEW  Comparison: 05/23/2009  Findings: Cardiomediastinal silhouette is within normal limits. The lungs are free of focal consolidations and pleural effusions. No evidence for pulmonary edema.  Degenerative changes are seen in the spine.  Surgical clips are present in the right upper quadrant of the abdomen.  IMPRESSION: No evidence for acute cardiopulmonary abnormality.  Original Report Authenticated By: Patterson Hammersmith,  M.D.   Ct Head Wo Contrast  03/20/2011  *RADIOLOGY REPORT*  Clinical Data: The numbness and chest pain.  Right arm numbness. Headaches and dizziness.  CT HEAD WITHOUT CONTRAST  Technique:  Contiguous axial images were obtained from the base of the skull through the vertex without contrast.  Comparison: CT head without contrast 03/20/2009.  Findings: No acute intracranial abnormality is present. Specifically, there is no evidence for acute infarct, hemorrhage, mass, hydrocephalus, or extra-axial fluid collection.  The paranasal sinuses and mastoid air cells are clear.  The globes and orbits are intact.  The osseous skull is intact.  IMPRESSION: Negative CT of the head.  Original Report Authenticated By: Jamesetta Orleans. MATTERN, M.D.     Date: 03/20/2011  Rate: 81  Rhythm: normal sinus rhythm  QRS Axis: normal  Intervals: normal  ST/T Wave abnormalities: nonspecific T wave changes  Conduction Disutrbances:none  Narrative Interpretation:   Old EKG Reviewed: unchanged Unchanged from 05/23/2009.  Results for orders placed during the hospital encounter of 03/20/11  TROPONIN I      Component Value Range   Troponin I <0.30  <0.30 (ng/mL)  BASIC METABOLIC PANEL      Component  Value Range   Sodium 141  135 - 145 (mEq/L)   Potassium 3.4 (*) 3.5 - 5.1 (mEq/L)   Chloride 101  96 - 112 (mEq/L)   CO2 31  19 - 32 (mEq/L)   Glucose, Bld 100 (*) 70 - 99 (mg/dL)   BUN 13  6 - 23 (mg/dL)   Creatinine, Ser 1.61  0.50 - 1.10 (mg/dL)   Calcium 09.6  8.4 - 10.5 (mg/dL)   GFR calc non Af Amer >90  >90 (mL/min)   GFR calc Af Amer >90  >90 (mL/min)  CBC      Component Value Range   WBC 5.7  4.0 - 10.5 (K/uL)   RBC 4.90  3.87 - 5.11 (MIL/uL)   Hemoglobin 13.8  12.0 - 15.0 (g/dL)   HCT 04.5  40.9 - 81.1 (%)   MCV 81.0  78.0 - 100.0 (fL)   MCH 28.2  26.0 - 34.0 (pg)   MCHC 34.8  30.0 - 36.0 (g/dL)   RDW 91.4  78.2 - 95.6 (%)   Platelets 272  150 - 400 (K/uL)  D-DIMER, QUANTITATIVE      Component Value Range   D-Dimer, Quant <0.22  0.00 - 0.48 (ug/mL-FEU)     1. Chest pain   2. Hand swelling    Preliminary results for right upper extremity venous duplex scan is as follows: No evidence of DVT or superficial thrombus.   MDM   Patient presented with 2 distinct complaints most likely not related. The first complaint was right hand numbness globelike up to distal forearm onset at 6 in the morning. This is associated with hand swelling and some mild discomfort. No elbow or shoulder pain no neck pain. Due to the swelling was concerned that this could be related to an upper tremor any DVT d-dimer was negative and Doppler studies were negative. Patient has had evaluation by rheumatology for questionable connective tissue type of arthritis that is still being worked up for swelling therefore may be related to that. Doubt due to the fact that her swelling that this is related to any strokelike symptoms. The second complaint was chest pain that started at 8:00 in the morning with substernal similar to her reflux disease but not exactly no radiation of pain except for maybe the numbness in  the right upper extremity no nausea no vomiting no shortness of breath. Workup for that was  significant for no acute changes on EKG troponin was negative chest x-ray no pneumonia no pneumothorax in addition negative d-dimer makes pulmonary embolism unlikely. Findings are not consistent with an acute cardiac event.        Shelda Jakes, MD 03/20/11 [phone]

## 2011-03-20 NOTE — ED Provider Notes (Signed)
11:45 AM  Date: 03/20/2011  Rate:81  Rhythm: normal sinus rhythm  QRS Axis: normal  Intervals: normal  ST/T Wave abnormalities: nonspecific T wave changes  Conduction Disutrbances:none  Narrative Interpretation: Borderline EKG  Old EKG Reviewed: unchanged    Carleene Cooper III, MD 03/20/11 1147

## 2011-03-20 NOTE — ED Notes (Signed)
Pt presenting to ed with c/o chest pain and some nausea pt denies vomiting. Pt also states that she had some right arm numbness. Pt with no slurred speech noted or facial droop. Pt oob ambulating to restroom. Pt's family is at bedside

## 2011-03-20 NOTE — ED Notes (Signed)
Patient verbalized understanding of discharge instructions and prescriptions. 

## 2011-03-25 ENCOUNTER — Other Ambulatory Visit: Payer: Self-pay | Admitting: Internal Medicine

## 2011-03-25 DIAGNOSIS — G459 Transient cerebral ischemic attack, unspecified: Secondary | ICD-10-CM

## 2011-03-29 ENCOUNTER — Other Ambulatory Visit: Payer: Self-pay | Admitting: Internal Medicine

## 2011-03-31 ENCOUNTER — Ambulatory Visit
Admission: RE | Admit: 2011-03-31 | Discharge: 2011-03-31 | Disposition: A | Discharge status: Home / Self Care | Payer: 59 | Source: Ambulatory Visit | Attending: Internal Medicine | Admitting: Internal Medicine

## 2011-03-31 DIAGNOSIS — G459 Transient cerebral ischemic attack, unspecified: Secondary | ICD-10-CM

## 2011-04-01 ENCOUNTER — Other Ambulatory Visit: Payer: Self-pay | Admitting: Internal Medicine

## 2011-04-29 ENCOUNTER — Other Ambulatory Visit: Payer: Self-pay | Admitting: Internal Medicine

## 2011-04-29 ENCOUNTER — Encounter: Payer: Self-pay | Admitting: Internal Medicine

## 2011-05-26 ENCOUNTER — Encounter: Payer: Self-pay | Admitting: Internal Medicine

## 2011-05-26 ENCOUNTER — Ambulatory Visit (AMBULATORY_SURGERY_CENTER): Payer: 59 | Admitting: *Deleted

## 2011-05-26 VITALS — Ht 65.0 in | Wt 186.2 lb

## 2011-05-26 DIAGNOSIS — Z1211 Encounter for screening for malignant neoplasm of colon: Secondary | ICD-10-CM

## 2011-05-26 DIAGNOSIS — Z86010 Personal history of colonic polyps: Secondary | ICD-10-CM

## 2011-05-26 MED ORDER — PEG-KCL-NACL-NASULF-NA ASC-C 100 G PO SOLR: ORAL | Status: DC

## 2011-05-26 NOTE — Progress Notes (Signed)
Changed patient's colonoscopy to 06-26-11 with propofol. Patient's last colon 2009 with propofol. Patient agrees. Pt states during 1 st colon she did wake up with discomfort.

## 2011-06-09 ENCOUNTER — Other Ambulatory Visit: Payer: 59 | Admitting: Internal Medicine

## 2011-06-26 ENCOUNTER — Encounter: Payer: Self-pay | Admitting: Internal Medicine

## 2011-06-26 ENCOUNTER — Ambulatory Visit (AMBULATORY_SURGERY_CENTER): Payer: 59 | Admitting: Internal Medicine

## 2011-06-26 VITALS — BP 122/79 | HR 78 | Temp 96.5°F | Resp 18 | Ht 65.0 in | Wt 186.0 lb

## 2011-06-26 DIAGNOSIS — D126 Benign neoplasm of colon, unspecified: Secondary | ICD-10-CM

## 2011-06-26 DIAGNOSIS — Z1211 Encounter for screening for malignant neoplasm of colon: Secondary | ICD-10-CM

## 2011-06-26 DIAGNOSIS — Z86010 Personal history of colonic polyps: Secondary | ICD-10-CM

## 2011-06-26 MED ORDER — SODIUM CHLORIDE 0.9 % IV SOLN: 500.0000 mL | INTRAVENOUS | Status: DC

## 2011-06-26 NOTE — Progress Notes (Signed)
Patient did not experience any of the following events: a burn prior to discharge; a fall within the facility; wrong site/side/patient/procedure/implant event; or a hospital transfer or hospital admission upon discharge from the facility. (G8907) Patient did not have preoperative order for IV antibiotic SSI prophylaxis. (G8918)  

## 2011-06-26 NOTE — Op Note (Signed)
[facility] [address] [place], [place]  [address]  COLONOSCOPY PROCEDURE REPORT  PATIENT:  Angie Olson, Angie Olson  MR#:  [id number] BIRTHDATE:  Oct 16, 1950, 60 yrs. old  GENDER:  female ENDOSCOPIST:  Iva Boop, MD, [facility]  PROCEDURE DATE:  06/26/2011 PROCEDURE:  Colonoscopy with snare polypectomy ASA CLASS:  Class III INDICATIONS:  surveillance and high-risk screening, history of pre-cancerous (adenomatous) colon polyps 2005 - diminutive adenoma (index) 2009 - 4 tubular adenomas max 8 mm MEDICATIONS:   MAC sedation, administered by CRNA, propofol (Diprivan) 400 mg IV  DESCRIPTION OF PROCEDURE:   After the risks benefits and alternatives of the procedure were thoroughly explained, informed consent was obtained.  Digital rectal exam was performed and revealed no abnormalities.   The LB 180AL [id number] endoscope was introduced through the anus and advanced to the cecum, which was identified by both the appendix and ileocecal valve, without limitations.  The quality of the prep was excellent, using MoviPrep.  The instrument was then slowly withdrawn as the colon was fully examined. <<PROCEDUREIMAGES>>  FINDINGS:  There were multiple polyps identified and removed. 6 polyps. 3 transverse, 1 ascending and 2 sigmoid. Largest polyp 7mm in ascending. Smallest 4 mm transverse (not recovered). All removed via cold snare.  This was otherwise a normal examination of the colon.   Retroflexed views in the rectum revealed no abnormalities.    The time to cecum = 6:14 minutes. The scope was then withdrawn in 13:24 minutes from the cecum and the procedure completed. COMPLICATIONS:  None ENDOSCOPIC IMPRESSION: 1) Polyps 6 removed, 5 recovered 2) Otherwise normal examination, excellent prep 3) Personal history of adenomas 2005(1) and 2009 (4)  REPEAT EXAM:  In for Colonoscopy, pending biopsy results.  Iva Boop, MD, [facility]  CC:  The Patient  n. eSIGNED:   Iva Boop at  06/26/2011 02:25 PM  Gladys Damme, [id number]

## 2011-06-26 NOTE — Patient Instructions (Signed)
Impressions/recommendations:  Polyps (handout given) Repeat colonoscopy pending pathology results.  Resume medications as you were taking them prior to your procedure.  YOU HAD AN ENDOSCOPIC PROCEDURE TODAY AT THE [facility]: Refer to the procedure report that was given to you for any specific questions about what was found during the examination.  If the procedure report does not answer your questions, please call your gastroenterologist to clarify.  If you requested that your care partner not be given the details of your procedure findings, then the procedure report has been included in a sealed envelope for you to review at your convenience later.  YOU SHOULD EXPECT: Some feelings of bloating in the abdomen. Passage of more gas than usual.  Walking can help get rid of the air that was put into your GI tract during the procedure and reduce the bloating. If you had a lower endoscopy (such as a colonoscopy or flexible sigmoidoscopy) you may notice spotting of blood in your stool or on the toilet paper. If you underwent a bowel prep for your procedure, then you may not have a normal bowel movement for a few days.  DIET: Your first meal following the procedure should be a light meal and then it is ok to progress to your normal diet.  A half-sandwich or bowl of soup is an example of a good first meal.  Heavy or fried foods are harder to digest and may make you feel nauseous or bloated.  Likewise meals heavy in dairy and vegetables can cause extra gas to form and this can also increase the bloating.  Drink plenty of fluids but you should avoid alcoholic beverages for 24 hours.  ACTIVITY: Your care partner should take you home directly after the procedure.  You should plan to take it easy, moving slowly for the rest of the day.  You can resume normal activity the day after the procedure however you should NOT DRIVE or use heavy machinery for 24 hours (because of the sedation medicines used  during the test).    SYMPTOMS TO REPORT IMMEDIATELY: A gastroenterologist can be reached at any hour.  During normal business hours, 8:30 AM to 5:00 PM Monday through Friday, call [phone].  After hours and on weekends, please call the GI answering service at [phone] who will take a message and have the physician on call contact you.   Following lower endoscopy (colonoscopy or flexible sigmoidoscopy):  Excessive amounts of blood in the stool  Significant tenderness or worsening of abdominal pains  Swelling of the abdomen that is new, acute  Fever of 100F or higher  Following upper endoscopy (EGD)  Vomiting of blood or coffee ground material  New chest pain or pain under the shoulder blades  Painful or persistently difficult swallowing  New shortness of breath  Fever of 100F or higher  Black, tarry-looking stools  FOLLOW UP: If any biopsies were taken you will be contacted by phone or by letter within the next 1-3 weeks.  Call your gastroenterologist if you have not heard about the biopsies in 3 weeks.  Our staff will call the home number listed on your records the next business day following your procedure to check on you and address any questions or concerns that you may have at that time regarding the information given to you following your procedure. This is a courtesy call and so if there is no answer at the home number and we have not heard from you through the emergency  physician on call, we will assume that you have returned to your regular daily activities without incident.  SIGNATURES/CONFIDENTIALITY: You and/or your care partner have signed paperwork which will be entered into your electronic medical record.  These signatures attest to the fact that that the information above on your After Visit Summary has been reviewed and is understood.  Full responsibility of the confidentiality of this discharge information lies with you and/or your care-partner.

## 2011-06-27 ENCOUNTER — Telehealth: Payer: Self-pay | Admitting: *Deleted

## 2011-06-27 NOTE — Telephone Encounter (Signed)
No answer.  Left message to call office if questions or concerns. 

## 2011-07-02 ENCOUNTER — Encounter: Payer: Self-pay | Admitting: Internal Medicine

## 2011-07-02 NOTE — Progress Notes (Signed)
Quick Note:  5 adenomas (6th polyp not recovered) Repeat colon about 06/2014 ______

## 2012-09-14 DIAGNOSIS — R112 Nausea with vomiting, unspecified: Secondary | ICD-10-CM

## 2012-09-14 DIAGNOSIS — Z9889 Other specified postprocedural states: Secondary | ICD-10-CM

## 2012-09-14 HISTORY — DX: Other specified postprocedural states: Z98.890

## 2012-09-14 HISTORY — DX: Nausea with vomiting, unspecified: R11.2

## 2012-10-05 ENCOUNTER — Other Ambulatory Visit: Payer: Self-pay | Admitting: Orthopedic Surgery

## 2012-10-05 ENCOUNTER — Encounter (HOSPITAL_COMMUNITY): Payer: Self-pay | Admitting: Pharmacy Technician

## 2012-10-05 MED ORDER — DEXAMETHASONE SODIUM PHOSPHATE 10 MG/ML IJ SOLN: 10.0000 mg | Freq: Once | INTRAMUSCULAR | Status: DC

## 2012-10-05 NOTE — Progress Notes (Signed)
Preoperative surgical orders have been place into the Epic hospital system for Angie Olson on 10/05/2012, 6:25 PM  by Patrica Duel for surgery on 10/13/2012.  Preop Knee Scope orders including IV Tylenol and IV Decadron as long as there are no contraindications to the above medications. Avel Peace, PA-C

## 2012-10-05 NOTE — Progress Notes (Signed)
Need orders please - pt coming for preop MON 10/11/12 - thank you 

## 2012-10-11 ENCOUNTER — Encounter (HOSPITAL_COMMUNITY)
Admission: RE | Admit: 2012-10-11 | Discharge: 2012-10-11 | Disposition: A | Discharge status: Home / Self Care | Payer: 59 | Source: Ambulatory Visit | Attending: Orthopedic Surgery | Admitting: Orthopedic Surgery

## 2012-10-11 ENCOUNTER — Ambulatory Visit (HOSPITAL_COMMUNITY)
Admission: RE | Admit: 2012-10-11 | Discharge: 2012-10-11 | Disposition: A | Discharge status: Home / Self Care | Payer: 59 | Source: Ambulatory Visit | Attending: Orthopedic Surgery | Admitting: Orthopedic Surgery

## 2012-10-11 ENCOUNTER — Encounter (HOSPITAL_COMMUNITY): Payer: Self-pay

## 2012-10-11 DIAGNOSIS — Z01812 Encounter for preprocedural laboratory examination: Secondary | ICD-10-CM | POA: Insufficient documentation

## 2012-10-11 DIAGNOSIS — Z01810 Encounter for preprocedural cardiovascular examination: Secondary | ICD-10-CM | POA: Insufficient documentation

## 2012-10-11 DIAGNOSIS — I700 Atherosclerosis of aorta: Secondary | ICD-10-CM | POA: Insufficient documentation

## 2012-10-11 DIAGNOSIS — Z01818 Encounter for other preprocedural examination: Secondary | ICD-10-CM | POA: Insufficient documentation

## 2012-10-11 HISTORY — DX: Other specified postprocedural states: Z98.890

## 2012-10-11 HISTORY — DX: Pneumonia, unspecified organism: J18.9

## 2012-10-11 HISTORY — DX: Personal history of other diseases of the digestive system: Z87.19

## 2012-10-11 HISTORY — DX: Unspecified osteoarthritis, unspecified site: M19.90

## 2012-10-11 HISTORY — DX: Pain, unspecified: R52

## 2012-10-11 HISTORY — DX: Nausea with vomiting, unspecified: R11.2

## 2012-10-11 LAB — BASIC METABOLIC PANEL
Calcium: 10 mg/dL (ref 8.4–10.5)
Chloride: 100 mEq/L (ref 96–112)
Creatinine, Ser: 0.7 mg/dL (ref 0.50–1.10)
GFR calc Af Amer: >90 mL/min (ref >90)

## 2012-10-11 LAB — CBC
MCV: 84.4 fL (ref 78.0–100.0)
Platelets: 355 10*3/uL (ref 150–400)
RDW: 14.2 % (ref 11.5–15.5)
WBC: 8.4 10*3/uL (ref 4.0–10.5)

## 2012-10-11 NOTE — Patient Instructions (Addendum)
YOUR SURGERY IS SCHEDULED AT [facility]  ON:  Wednesday  7/30  REPORT TO [facility] AT: 9:15 AM      PHONE # FOR SHORT STAY IS [phone]  DO NOT EAT OR DRINK ANYTHING AFTER MIDNIGHT THE NIGHT BEFORE YOUR SURGERY.  YOU MAY BRUSH YOUR TEETH, RINSE OUT YOUR MOUTH--BUT NO WATER, NO FOOD, NO CHEWING GUM, NO MINTS, NO CANDIES, NO CHEWING TOBACCO.  PLEASE TAKE THE FOLLOWING MEDICATIONS THE AM OF YOUR SURGERY WITH A FEW SIPS OF WATER:  ATORVASTATIN, NEXIUM, PREDNISONE.  MAY USE NASONEX   DO NOT BRING VALUABLES, MONEY, CREDIT CARDS.  DO NOT WEAR JEWELRY, MAKE-UP, NAIL POLISH AND NO METAL PINS OR CLIPS IN YOUR HAIR. CONTACT LENS, DENTURES / PARTIALS, GLASSES SHOULD NOT BE WORN TO SURGERY AND IN MOST CASES-HEARING AIDS WILL NEED TO BE REMOVED.  BRING YOUR GLASSES CASE, ANY EQUIPMENT NEEDED FOR YOUR CONTACT LENS. FOR PATIENTS ADMITTED TO THE HOSPITAL--CHECK OUT TIME THE DAY OF DISCHARGE IS 11:00 AM.  ALL INPATIENT ROOMS ARE PRIVATE - WITH BATHROOM, TELEPHONE, TELEVISION AND WIFI INTERNET.  IF YOU ARE BEING DISCHARGED THE SAME DAY OF YOUR SURGERY--YOU CAN NOT DRIVE YOURSELF HOME--AND SHOULD NOT GO HOME ALONE BY TAXI OR BUS.  NO DRIVING OR OPERATING MACHINERY FOR 24 HOURS FOLLOWING ANESTHESIA / PAIN MEDICATIONS.  PLEASE MAKE ARRANGEMENTS FOR SOMEONE TO BE WITH YOU AT HOME THE FIRST 24 HOURS AFTER SURGERY. RESPONSIBLE DRIVER'S NAME  PT'S HUSBAND JAMES Martorelli                                               PHONE #   [phone]                            PLEASE READ OVER ANY  FACT SHEETS THAT YOU WERE GIVEN: INCENTIVE SPIROMETER INFORMATION. FAILURE TO FOLLOW THESE INSTRUCTIONS MAY RESULT IN THE CANCELLATION OF YOUR SURGERY.   PATIENT SIGNATURE_________________________________

## 2012-10-11 NOTE — Pre-Procedure Instructions (Signed)
EKG AND CXR WERE DONE TODAY - PREOP AT [facility]. 

## 2012-10-12 NOTE — H&P (Signed)
CC- Angie Olson is a 62 y.o. female who presents with right knee pain.  HPI- . Knee Pain: Patient presents with knee pain involving the  right knee. Onset of the symptoms was several months ago. Inciting event: slipped and twisted knee. Current symptoms include giving out, pain located medially and swelling. Pain is aggravated by any weight bearing, lateral movements, pivoting, rising after sitting, standing and walking.  Patient has had no prior knee problems. Evaluation to date: MRI: abnormal ACL tear and medial meniscal tear. Treatment to date: brace which is not very effective.  Past Medical History  Diagnosis Date  . Hypertension   . Acid reflux   . Bronchitis     CHRONIC   . Anxiety     PT'S SON DIED Aug 01, 2010  . Pneumonia     X 3  - LAST TIME WAS 2011/08/01  . Stroke 03/2011    TIA--PT EXPERIENCED NUMBNESS RT HAND AND ARM , HEADACHE AND NAUSEA. NO RESIDUAL PROBLEMS  . H/O hiatal hernia   . Arthritis     RA AND OA  . Pain     RIGHT KNEE - TORN MENISCUS AND ACL  . PONV (postoperative nausea and vomiting)     REALLY SORE THROAT AND COUGHING AFTER ONE SURGERY - 6 YRS AGO WITH ANKLE SURGERY--NO PROBLEMS WITH SORE THROAT WITH SURGERIES AFTER THAT ONE    Past Surgical History  Procedure Laterality Date  . Ankle surgery      left  . Shoulder surgery      left  . Thumb surgery    . Cholecystectomy    . Back surgery    . Colonoscopy  2005&2009  . Trigger finger release      X2 ON RIGHT HAND    Prior to Admission medications   Medication Sig Start Date End Date Taking? Authorizing Provider  ALPRAZolam Prudy Feeler) 1 MG tablet Take 0.5 mg by mouth at bedtime as needed for sleep.     Historical Provider, MD  aspirin EC 81 MG tablet Take 81 mg by mouth daily.      Historical Provider, MD  atorvastatin (LIPITOR) 20 MG tablet Take 20 mg by mouth every morning.  05/20/11   Historical Provider, MD  Calcium Carbonate-Vitamin D (CALCIUM + D PO) Take 1 tablet by mouth daily as needed (for  calcium).     Historical Provider, MD  diclofenac sodium (VOLTAREN) 1 % GEL Apply 2 g topically daily as needed (for LEFT shoulder pain AND LEFT KNEE PAIN).     Historical Provider, MD  esomeprazole (NEXIUM) 40 MG capsule Take 40 mg by mouth daily before breakfast.    Historical Provider, MD  etanercept (ENBREL) 50 MG/ML injection Inject 50 mg into the skin once a week.    Historical Provider, MD  Glucosamine-Chondroitin (OSTEO BI-FLEX REGULAR STRENGTH PO) Take 1 tablet by mouth daily as needed (for glucosamine).     Historical Provider, MD  hydrochlorothiazide (HYDRODIURIL) 25 MG tablet Take 25 mg by mouth every morning.     Historical Provider, MD  leflunomide (ARAVA) 20 MG tablet Take 20 mg by mouth daily. TAKES IN THE AFTERNOON    Historical Provider, MD  mometasone (NASONEX) 50 MCG/ACT nasal spray Place 2 sprays into the nose daily as needed.     Historical Provider, MD  montelukast (SINGULAIR) 10 MG tablet Take 10 mg by mouth every evening.    Historical Provider, MD  predniSONE (DELTASONE) 5 MG tablet Take 5 mg by mouth every morning.  FOR RA 05/05/11   Historical Provider, MD  sucralfate (CARAFATE) 1 G tablet Take 1 g by mouth 4 (four) times daily as needed (for acid reflux).    Historical Provider, MD  traMADol (ULTRAM) 50 MG tablet Take 50 mg by mouth every 6 (six) hours as needed for pain.    Historical Provider, MD   KNEE EXAM antalgic gait; Effusion; ROM 0-125; collateral ligaments intact; Lachmann positive; medial joint line tenderness  Physical Examination: General appearance - alert, well appearing, and in no distress Mental status - alert, oriented to person, place, and time Chest - clear to auscultation, no wheezes, rales or rhonchi, symmetric air entry Heart - normal rate, regular rhythm, normal S1, S2, no murmurs, rubs, clicks or gallops Abdomen - soft, nontender, nondistended, no masses or organomegaly Neurological - alert, oriented, normal speech, no focal findings or  movement disorder noted   Asessment/Plan--- Right knee medial meniscal tea and ACL tearr- - Plan right knee arthroscopy with meniscal debridement. iShe has had a sports med consultation and it was felt that she was not a candidate for ACL reconstruction. She has a significant symptomatic medial meniscal tear with unrelenting pain and presents for arthroscopy and meniscal debridement. Procedure risks and potential comps discussed with patient who elects to proceed. Goals are decreased pain and increased function with a high likelihood of achieving both

## 2012-10-12 NOTE — Pre-Procedure Instructions (Signed)
Pt's preop bmet report faxed to Dr. Deri Fuelling office for review of potassium of 3.1

## 2012-10-13 ENCOUNTER — Ambulatory Visit (HOSPITAL_COMMUNITY): Payer: 59 | Admitting: Registered Nurse

## 2012-10-13 ENCOUNTER — Encounter (HOSPITAL_COMMUNITY): Payer: Self-pay | Admitting: *Deleted

## 2012-10-13 ENCOUNTER — Encounter (HOSPITAL_COMMUNITY): Payer: Self-pay | Admitting: Registered Nurse

## 2012-10-13 ENCOUNTER — Encounter (HOSPITAL_COMMUNITY)
Admission: RE | Disposition: A | Discharge status: Home / Self Care | Payer: Self-pay | Source: Ambulatory Visit | Attending: Orthopedic Surgery

## 2012-10-13 ENCOUNTER — Ambulatory Visit (HOSPITAL_COMMUNITY)
Admission: RE | Admit: 2012-10-13 | Discharge: 2012-10-13 | Disposition: A | Discharge status: Home / Self Care | Payer: 59 | Source: Ambulatory Visit | Attending: Orthopedic Surgery | Admitting: Orthopedic Surgery

## 2012-10-13 DIAGNOSIS — J42 Unspecified chronic bronchitis: Secondary | ICD-10-CM | POA: Insufficient documentation

## 2012-10-13 DIAGNOSIS — S83249A Other tear of medial meniscus, current injury, unspecified knee, initial encounter: Secondary | ICD-10-CM

## 2012-10-13 DIAGNOSIS — S83241A Other tear of medial meniscus, current injury, right knee, initial encounter: Secondary | ICD-10-CM

## 2012-10-13 DIAGNOSIS — I10 Essential (primary) hypertension: Secondary | ICD-10-CM | POA: Insufficient documentation

## 2012-10-13 DIAGNOSIS — IMO0002 Reserved for concepts with insufficient information to code with codable children: Secondary | ICD-10-CM | POA: Insufficient documentation

## 2012-10-13 DIAGNOSIS — M069 Rheumatoid arthritis, unspecified: Secondary | ICD-10-CM | POA: Insufficient documentation

## 2012-10-13 DIAGNOSIS — M1990 Unspecified osteoarthritis, unspecified site: Secondary | ICD-10-CM | POA: Insufficient documentation

## 2012-10-13 DIAGNOSIS — Z79899 Other long term (current) drug therapy: Secondary | ICD-10-CM | POA: Insufficient documentation

## 2012-10-13 DIAGNOSIS — M2240 Chondromalacia patellae, unspecified knee: Secondary | ICD-10-CM | POA: Insufficient documentation

## 2012-10-13 DIAGNOSIS — S83509A Sprain of unspecified cruciate ligament of unspecified knee, initial encounter: Secondary | ICD-10-CM | POA: Insufficient documentation

## 2012-10-13 DIAGNOSIS — X58XXXA Exposure to other specified factors, initial encounter: Secondary | ICD-10-CM | POA: Insufficient documentation

## 2012-10-13 DIAGNOSIS — K219 Gastro-esophageal reflux disease without esophagitis: Secondary | ICD-10-CM | POA: Insufficient documentation

## 2012-10-13 DIAGNOSIS — Z8673 Personal history of transient ischemic attack (TIA), and cerebral infarction without residual deficits: Secondary | ICD-10-CM | POA: Insufficient documentation

## 2012-10-13 HISTORY — PX: KNEE ARTHROSCOPY: SHX127

## 2012-10-13 SURGERY — ARTHROSCOPY, KNEE
Anesthesia: General | Site: Knee | Laterality: Right | Wound class: Clean

## 2012-10-13 MED ORDER — CEFAZOLIN SODIUM-DEXTROSE 2-3 GM-% IV SOLR
2.0000 g | INTRAVENOUS | Status: AC
  Administered 2012-10-13: 2 g via INTRAVENOUS

## 2012-10-13 MED ORDER — ACETAMINOPHEN 10 MG/ML IV SOLN
1000.0000 mg | Freq: Once | INTRAVENOUS | Status: AC
  Administered 2012-10-13: 1000 mg via INTRAVENOUS
  Filled 2012-10-13: qty 100

## 2012-10-13 MED ORDER — LACTATED RINGERS IR SOLN
Status: DC | PRN
  Administered 2012-10-13: 6000 mL

## 2012-10-13 MED ORDER — LACTATED RINGERS IV SOLN
INTRAVENOUS | Status: DC
  Administered 2012-10-13: 1000 mL via INTRAVENOUS

## 2012-10-13 MED ORDER — ONDANSETRON HCL 4 MG/2ML IJ SOLN
INTRAMUSCULAR | Status: DC | PRN
  Administered 2012-10-13: 4 mg via INTRAVENOUS

## 2012-10-13 MED ORDER — METHOCARBAMOL 500 MG PO TABS: 500.0000 mg | ORAL_TABLET | Freq: Four times a day (QID) | ORAL | Status: DC

## 2012-10-13 MED ORDER — LIDOCAINE HCL (CARDIAC) 20 MG/ML IV SOLN
INTRAVENOUS | Status: DC | PRN
  Administered 2012-10-13: 80 mg via INTRAVENOUS

## 2012-10-13 MED ORDER — HYDROMORPHONE HCL PF 1 MG/ML IJ SOLN
INTRAMUSCULAR | Status: AC
  Filled 2012-10-13: qty 1

## 2012-10-13 MED ORDER — SODIUM CHLORIDE 0.9 % IV SOLN: INTRAVENOUS | Status: DC

## 2012-10-13 MED ORDER — FENTANYL CITRATE 0.05 MG/ML IJ SOLN
INTRAMUSCULAR | Status: DC | PRN
  Administered 2012-10-13 (×3): 50 ug via INTRAVENOUS

## 2012-10-13 MED ORDER — PROPOFOL 10 MG/ML IV BOLUS
INTRAVENOUS | Status: DC | PRN
  Administered 2012-10-13: 200 mg via INTRAVENOUS

## 2012-10-13 MED ORDER — HYDROMORPHONE HCL PF 1 MG/ML IJ SOLN
0.2500 mg | INTRAMUSCULAR | Status: DC | PRN
  Administered 2012-10-13 (×4): 0.5 mg via INTRAVENOUS

## 2012-10-13 MED ORDER — CHLORHEXIDINE GLUCONATE 4 % EX LIQD
60.0000 mL | Freq: Once | CUTANEOUS | Status: DC
  Filled 2012-10-13: qty 60

## 2012-10-13 MED ORDER — OXYCODONE HCL 5 MG PO TABS: 5.0000 mg | ORAL_TABLET | ORAL | Status: DC | PRN

## 2012-10-13 MED ORDER — FENTANYL CITRATE 0.05 MG/ML IJ SOLN: 25.0000 ug | INTRAMUSCULAR | Status: DC | PRN

## 2012-10-13 MED ORDER — BUPIVACAINE-EPINEPHRINE 0.25% -1:200000 IJ SOLN
INTRAMUSCULAR | Status: DC | PRN
  Administered 2012-10-13: 20 mL

## 2012-10-13 MED ORDER — CEFAZOLIN SODIUM-DEXTROSE 2-3 GM-% IV SOLR
INTRAVENOUS | Status: AC
  Filled 2012-10-13: qty 50

## 2012-10-13 MED ORDER — PROMETHAZINE HCL 25 MG/ML IJ SOLN: 6.2500 mg | INTRAMUSCULAR | Status: DC | PRN

## 2012-10-13 MED ORDER — BUPIVACAINE-EPINEPHRINE PF 0.25-1:200000 % IJ SOLN
INTRAMUSCULAR | Status: AC
  Filled 2012-10-13: qty 30

## 2012-10-13 MED ORDER — ONDANSETRON HCL 4 MG PO TABS
4.0000 mg | ORAL_TABLET | Freq: Once | ORAL | Status: AC
  Administered 2012-10-13: 4 mg via ORAL
  Filled 2012-10-13: qty 1

## 2012-10-13 MED ORDER — MIDAZOLAM HCL 5 MG/5ML IJ SOLN
INTRAMUSCULAR | Status: DC | PRN
  Administered 2012-10-13 (×2): 1 mg via INTRAVENOUS

## 2012-10-13 MED ORDER — DEXAMETHASONE SODIUM PHOSPHATE 10 MG/ML IJ SOLN
INTRAMUSCULAR | Status: DC | PRN
  Administered 2012-10-13: 10 mg via INTRAVENOUS

## 2012-10-13 SURGICAL SUPPLY — 25 items
BANDAGE ELASTIC 6 VELCRO ST LF (GAUZE/BANDAGES/DRESSINGS) ×1 IMPLANT
BLADE 4.2CUDA (BLADE) ×2 IMPLANT
CLOTH BEACON ORANGE TIMEOUT ST (SAFETY) ×2 IMPLANT
CUFF TOURN SGL QUICK 34 (TOURNIQUET CUFF) ×2
CUFF TRNQT CYL 34X4X40X1 (TOURNIQUET CUFF) ×1 IMPLANT
DRAPE U-SHAPE 47X51 STRL (DRAPES) ×2 IMPLANT
DRSG EMULSION OIL 3X3 NADH (GAUZE/BANDAGES/DRESSINGS) ×2 IMPLANT
DRSG PAD ABDOMINAL 8X10 ST (GAUZE/BANDAGES/DRESSINGS) ×1 IMPLANT
DURAPREP 26ML APPLICATOR (WOUND CARE) ×2 IMPLANT
GLOVE BIO SURGEON STRL SZ8 (GLOVE) ×2 IMPLANT
GLOVE BIOGEL PI IND STRL 8 (GLOVE) ×1 IMPLANT
GLOVE BIOGEL PI INDICATOR 8 (GLOVE) ×1
GOWN STRL NON-REIN LRG LVL3 (GOWN DISPOSABLE) ×2 IMPLANT
MANIFOLD NEPTUNE II (INSTRUMENTS) ×3 IMPLANT
PACK ARTHROSCOPY WL (CUSTOM PROCEDURE TRAY) ×2 IMPLANT
PACK ICE MAXI GEL EZY WRAP (MISCELLANEOUS) ×4 IMPLANT
PADDING CAST COTTON 6X4 STRL (CAST SUPPLIES) ×4 IMPLANT
POSITIONER SURGICAL ARM (MISCELLANEOUS) ×2 IMPLANT
SET ARTHROSCOPY TUBING (MISCELLANEOUS) ×2
SET ARTHROSCOPY TUBING LN (MISCELLANEOUS) ×1 IMPLANT
SPONGE GAUZE 4X4 12PLY (GAUZE/BANDAGES/DRESSINGS) ×1 IMPLANT
SUT ETHILON 4 0 PS 2 18 (SUTURE) ×2 IMPLANT
TOWEL OR 17X26 10 PK STRL BLUE (TOWEL DISPOSABLE) ×2 IMPLANT
WAND 90 DEG TURBOVAC W/CORD (SURGICAL WAND) ×2 IMPLANT
WRAP KNEE MAXI GEL POST OP (GAUZE/BANDAGES/DRESSINGS) ×3 IMPLANT

## 2012-10-13 NOTE — Interval H&P Note (Signed)
History and Physical Interval Note:  10/13/2012 10:42 AM  Angie Olson  has presented today for surgery, with the diagnosis of Right Knee Medial Menical Tear  The various methods of treatment have been discussed with the patient and family. After consideration of risks, benefits and other options for treatment, the patient has consented to  Procedure(s): RIGHT KNEE ARTHROSCOPY WITH DEBRIDEMENT (Right) as a surgical intervention .  The patient's history has been reviewed, patient examined, no change in status, stable for surgery.  I have reviewed the patient's chart and labs.  Questions were answered to the patient's satisfaction.     Loanne Drilling

## 2012-10-13 NOTE — Progress Notes (Signed)
Pt up to bathroom and still having c/o nausea.  Zofran po given and pt is stating she wants to go home.

## 2012-10-13 NOTE — Anesthesia Preprocedure Evaluation (Addendum)
Anesthesia Evaluation  Patient identified by MRN, date of birth, ID band Patient awake  General Assessment Comment:.  Hypertension     .  Acid reflux     .  Bronchitis         CHRONIC    .  Anxiety         PT'S SON DIED 2010/07/28   .  Pneumonia         X 3  - LAST TIME WAS Jul 28, 2011   .  Stroke  03/2011       TIA--PT EXPERIENCED NUMBNESS RT HAND AND ARM , HEADACHE AND NAUSEA. NO RESIDUAL PROBLEMS   .  H/O hiatal hernia     .  Arthritis         RA AND OA   .  Pain         RIGHT KNEE - TORN MENISCUS AND ACL   .  PONV (postoperative nausea and vomiting)         REALLY SORE THROAT AND COUGHING AFTER ONE SURGERY - 6 YRS AGO WITH ANKLE SURGERY--NO PROBLEMS WITH SORE THROAT WITH SURGERIES AFTER THAT ONE     Reviewed: Allergy & Precautions, H&P , NPO status , Patient's Chart, lab work & pertinent test results  History of Anesthesia Complications (+) PONV  Airway Mallampati: II TM Distance: >3 FB Neck ROM: Full    Dental no notable dental hx.    Pulmonary pneumonia -, resolved,  breath sounds clear to auscultation  Pulmonary exam normal       Cardiovascular Exercise Tolerance: Good hypertension, Pt. on medications negative cardio ROS  Rhythm:Regular Rate:Normal     Neuro/Psych Anxiety TIACVA    GI/Hepatic Neg liver ROS, hiatal hernia, GERD-  Medicated,  Endo/Other  negative endocrine ROS  Renal/GU negative Renal ROS  negative genitourinary   Musculoskeletal  (+) Arthritis -, Osteoarthritis and Rheumatoid disorders,    Abdominal   Peds negative pediatric ROS (+)  Hematology negative hematology ROS (+)   Anesthesia Other Findings   Reproductive/Obstetrics negative OB ROS                          Anesthesia Physical Anesthesia Plan  ASA: III  Anesthesia Plan: General   Post-op Pain Management:    Induction: Intravenous  Airway Management Planned: LMA  Additional Equipment:   Intra-op  Plan:   Post-operative Plan: Extubation in OR  Informed Consent: I have reviewed the patients History and Physical, chart, labs and discussed the procedure including the risks, benefits and alternatives for the proposed anesthesia with the patient or authorized representative who has indicated his/her understanding and acceptance.   Dental advisory given  Plan Discussed with: CRNA  Anesthesia Plan Comments:         Anesthesia Quick Evaluation

## 2012-10-13 NOTE — Progress Notes (Signed)
Pt up and bedside to get dressed and she suddenly felt sick and started vomiting.  Dr. Lequita Halt notified and Zofran po ordered as well as Zofran prescription sent to her pharmacy.

## 2012-10-13 NOTE — Op Note (Signed)
Preoperative diagnosis-  Right knee medial meniscal tear  Postoperative diagnosis Right- knee medial meniscal tear   Plus Right medial femoral chondral defect  Procedure- Right knee arthroscopy with medial  Meniscal debridement and chondroplasty  Surgeon- Gus Rankin. Girtrude Enslin, MD  Anesthesia-General  EBL-  minimal Complications- None  Condition- PACU - hemodynamically stable.  Brief clinical note- -Angie Olson is a 62 y.o.  female with a several month history of rightt knee pain and mechanical symptoms. Exam and history suggested medial meniscal tear and ACL tear confirmed by MRI. She has had significant medial pain and mechanical symptoms. She was felt to not be a candidate for ACL reconstruction but needs the meniscal tear addressed due to the pain and mechanical symptoms.The patient presents now for arthroscopy and debridement   Procedure in detail -       After successful administration of General anesthetic, a tourmiquet is placed high on the Right  thigh and the Right lower extremity is prepped and draped in the usual sterile fashion. Time out is performed by the surgical team. Standard superomedial and inferolateral portal sites are marked and incisions made with an 11 blade. The inflow cannula is passed through the superomedial portal and camera through the inferolateral portal and inflow is initiated. Arthroscopic visualization proceeds.      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 The medial and lateral gutters are visualized and there are  no loose bodies. Flexion and valgus force is applied to the knee and the medial compartment is entered. A spinal needle is passed into the joint through the site marked for the inferomedial portal. A small incision is made and the dilator passed into the joint. The findings for the medial compartment are 2 x 2 cm chondral defect medial femoral condyle  and tear posterior horn medial meniscus . The tear is debrided to a stable base with baskets and a shaver and sealed off with the Arthrocare. The shaver is used to debride the unstable cartilage to a stable bony base with stable edges and then the bone is abraded with the shaver. The cartilage edges are probed and found to be stable.    The intercondylar notch is visualized and the ACL appears  torn. There is a large scarified piece of the ACL anterior to the notch which is potentially impinging during range of motion. This lesion is debrided with the Arthrocare. The lateral compartment is entered and the findings are normal .      The joint is again inspected and there are no other tears, defects or loose bodies identified. The arthroscopic equipment is then removed from the inferior portals which are closed with interrupted 4-0 nylon. 20 ml of .25% Marcaine with epinephrine are injected through the inflow cannula and the cannula is then removed and the portal closed with nylon. The incisions are cleaned and dried and a bulky sterile dressing is applied. The patient is then awakened and transported to recovery in stable condition.   10/13/2012, 11:54 AM

## 2012-10-13 NOTE — Transfer of Care (Signed)
Immediate Anesthesia Transfer of Care Note  Patient: Angie Olson  Procedure(s) Performed: Procedure(s): RIGHT KNEE ARTHROSCOPY WITH DEBRIDEMENT, CHONDROPLASTY (Right)  Patient Location: PACU  Anesthesia Type:General  Level of Consciousness: awake, alert , oriented and patient cooperative  Airway & Oxygen Therapy: Patient Spontanous Breathing and Patient connected to face mask oxygen  Post-op Assessment: Report given to PACU RN, Post -op Vital signs reviewed and stable and Patient moving all extremities  Post vital signs: Reviewed and stable  Complications: No apparent anesthesia complications

## 2012-10-13 NOTE — Anesthesia Postprocedure Evaluation (Signed)
  Anesthesia Post-op Note  Patient: Angie Olson  Procedure(s) Performed: Procedure(s) (LRB): RIGHT KNEE ARTHROSCOPY WITH DEBRIDEMENT, CHONDROPLASTY (Right)  Patient Location: PACU  Anesthesia Type: General  Level of Consciousness: awake and alert   Airway and Oxygen Therapy: Patient Spontanous Breathing  Post-op Pain: mild  Post-op Assessment: Post-op Vital signs reviewed, Patient's Cardiovascular Status Stable, Respiratory Function Stable, Patent Airway and No signs of Nausea or vomiting  Last Vitals:  Filed Vitals:   10/13/12 1257  BP: 121/78  Pulse: 71  Temp: 36.2 C  Resp: 16    Post-op Vital Signs: stable   Complications: No apparent anesthesia complications

## 2012-10-13 NOTE — Pre-Procedure Instructions (Signed)
FAXED NOTE RECEIVED FROM DR. Lequita Halt - NO ACTION NEEDED REGARDING POTASSIUM 3.1

## 2012-10-13 NOTE — Transfer of Care (Signed)
Immediate Anesthesia Transfer of Care Note  Patient: Angie Olson  Procedure(s) Performed: Procedure(s): RIGHT KNEE ARTHROSCOPY WITH DEBRIDEMENT, CHONDROPLASTY (Right)  Patient Location: PACU  Anesthesia Type:General  Level of Consciousness: awake, alert , oriented and patient cooperative  Airway & Oxygen Therapy: Patient Spontanous Breathing and Patient connected to face mask oxygen  Post-op Assessment: Report given to PACU RN, Post -op Vital signs reviewed and stable and Patient moving all extremities  Post vital signs: Reviewed and stable  Complications: No apparent anesthesia complications 

## 2012-10-13 NOTE — Preoperative (Signed)
Beta Blockers   Reason not to administer Beta Blockers:Not Applicable 

## 2012-10-14 ENCOUNTER — Encounter (HOSPITAL_COMMUNITY): Payer: Self-pay | Admitting: Orthopedic Surgery

## 2012-11-08 ENCOUNTER — Other Ambulatory Visit: Payer: Self-pay | Admitting: Nurse Practitioner

## 2012-11-08 ENCOUNTER — Ambulatory Visit
Admission: RE | Admit: 2012-11-08 | Discharge: 2012-11-08 | Disposition: A | Discharge status: Home / Self Care | Payer: 59 | Source: Ambulatory Visit | Attending: Nurse Practitioner | Admitting: Nurse Practitioner

## 2012-11-08 DIAGNOSIS — R05 Cough: Secondary | ICD-10-CM

## 2012-11-08 DIAGNOSIS — R0602 Shortness of breath: Secondary | ICD-10-CM

## 2012-11-08 DIAGNOSIS — R5383 Other fatigue: Secondary | ICD-10-CM

## 2012-11-08 MED ORDER — IOHEXOL 350 MG/ML SOLN
100.0000 mL | Freq: Once | INTRAVENOUS | Status: AC | PRN
  Administered 2012-11-08: 100 mL via INTRAVENOUS

## 2012-12-07 ENCOUNTER — Other Ambulatory Visit: Payer: Self-pay | Admitting: Obstetrics and Gynecology

## 2012-12-07 DIAGNOSIS — R928 Other abnormal and inconclusive findings on diagnostic imaging of breast: Secondary | ICD-10-CM

## 2012-12-21 ENCOUNTER — Ambulatory Visit
Admission: RE | Admit: 2012-12-21 | Discharge: 2012-12-21 | Disposition: A | Discharge status: Home / Self Care | Payer: 59 | Source: Ambulatory Visit | Attending: Obstetrics and Gynecology | Admitting: Obstetrics and Gynecology

## 2012-12-21 DIAGNOSIS — R928 Other abnormal and inconclusive findings on diagnostic imaging of breast: Secondary | ICD-10-CM

## 2013-02-15 ENCOUNTER — Other Ambulatory Visit: Payer: Self-pay | Admitting: Orthopedic Surgery

## 2013-02-15 NOTE — Progress Notes (Signed)
Preoperative surgical orders have been place into the Epic hospital system for Angie Olson on 02/15/2013, 9:47 AM  by Patrica Duel for surgery on 03-02-2013.  Preop Knee Scope orders including IV Tylenol and IV Decadron as long as there are no contraindications to the above medications. Angie Peace, PA-C

## 2013-02-25 ENCOUNTER — Encounter (HOSPITAL_COMMUNITY): Payer: Self-pay | Admitting: Pharmacy Technician

## 2013-02-25 NOTE — Patient Instructions (Addendum)
20 Angie Olson  02/25/2013   Your procedure is scheduled on: Wednesday December 17th  Report to [facility] at  1030 AM.  Call this number if you have problems the morning of surgery [phone]   Remember:   Do not eat food  :After Midnight.   clear liquids midnight until 0700 am day of surgery, then nothing by mouth.   Take these medicines the morning of surgery with A SIP OF WATER: lipitor, zyrtec, nexium, prednisone                                SEE [place] PREPARING FOR SURGERY SHEET             You may not have any metal on your body including hair pins and piercings  Do not wear jewelry, make-up.  Do not wear lotions, powders, or perfumes. You may wear deodorant.   Men may shave face and neck.  Do not bring valuables to the hospital. [facility] IS NOT RESPONSIBLE FOR VALUEABLES.  Contacts, dentures or bridgework may not be worn into surgery.  Leave suitcase in the car. After surgery it may be brought to your room.  For patients admitted to the hospital, checkout time is 11:00 AM the day of discharge.   Patients discharged the day of surgery will not be allowed to drive home.  Name and phone number of your driver:  Special Instructions: N/A   Please read over the following fact sheets that you were given: Incentive spirometer sheet  Call Cain Sieve RN pre op nurse if needed [phone]    FAILURE TO FOLLOW THESE INSTRUCTIONS MAY RESULT IN THE CANCELLATION OF YOUR SURGERY.  PATIENT SIGNATURE___________________________________________  NURSE SIGNATURE_____________________________________________

## 2013-02-25 NOTE — Progress Notes (Signed)
Chest ct 11-08-12 epic ekg 10-11-12 epic Carotid doppler 03-31-11 epic

## 2013-02-28 ENCOUNTER — Encounter (INDEPENDENT_AMBULATORY_CARE_PROVIDER_SITE_OTHER): Payer: Self-pay

## 2013-02-28 ENCOUNTER — Encounter (HOSPITAL_COMMUNITY): Payer: Self-pay

## 2013-02-28 ENCOUNTER — Encounter (HOSPITAL_COMMUNITY)
Admission: RE | Admit: 2013-02-28 | Discharge: 2013-02-28 | Disposition: A | Discharge status: Home / Self Care | Payer: 59 | Source: Ambulatory Visit | Attending: Orthopedic Surgery | Admitting: Orthopedic Surgery

## 2013-02-28 LAB — BASIC METABOLIC PANEL
BUN: 16 mg/dL (ref 6–23)
Calcium: 9.9 mg/dL (ref 8.4–10.5)
Chloride: 100 mEq/L (ref 96–112)
Creatinine, Ser: 0.91 mg/dL (ref 0.50–1.10)
GFR calc Af Amer: 77 mL/min — ABNORMAL LOW (ref >90)
GFR calc non Af Amer: 66 mL/min — ABNORMAL LOW (ref >90)
Glucose, Bld: 110 mg/dL — ABNORMAL HIGH (ref 70–99)
Sodium: 141 mEq/L (ref 135–145)

## 2013-02-28 LAB — CBC
HCT: 40.2 % (ref 36.0–46.0)
Hemoglobin: 13.6 g/dL (ref 12.0–15.0)
MCH: 28.8 pg (ref 26.0–34.0)
MCHC: 33.8 g/dL (ref 30.0–36.0)
Platelets: 307 10*3/uL (ref 150–400)
RDW: 13.5 % (ref 11.5–15.5)

## 2013-03-01 NOTE — H&P (Signed)
CC- Angie Olson is a 62 y.o. female who presents with right knee pain.  HPI- . Knee Pain: Patient presents with knee swelling and pain involving the  right knee. Onset of the symptoms was several months ago. Inciting event: stumbled. Current symptoms include giving out, pain located medially and swelling. Pain is aggravated by lateral movements, pivoting, rising after sitting, standing and walking.  Patient has had prior knee problems. Evaluation to date: MRI: abnormal recurrent medial meniscal tear. Treatment to date: had surgery several months ago and re-injured knee with document ed medial meniscal tear.  Past Medical History  Diagnosis Date  . Hypertension   . Acid reflux   . Bronchitis     CHRONIC   . Anxiety     PT'S SON DIED Aug 12, 2010  . Pneumonia     X 3  - LAST TIME WAS 12-Aug-2011  . H/O hiatal hernia   . Arthritis     RA AND OA  . Pain     RIGHT KNEE - TORN MENISCUS AND ACL  . Stroke 03/2011    TIA--PT EXPERIENCED NUMBNESS RT HAND AND ARM , HEADACHE AND NAUSEA. NO RESIDUAL PROBLEMS  . PONV (postoperative nausea and vomiting) 09-2012    sore throat after ankle surgery 6 yrs ago and ponv 09-2012    Past Surgical History  Procedure Laterality Date  . Ankle surgery      left  . Shoulder surgery      left  . Thumb surgery    . Cholecystectomy    . Colonoscopy  2005&2009  . Trigger finger release      X2 ON RIGHT HAND  . Knee arthroscopy Right 10/13/2012    Procedure: RIGHT KNEE ARTHROSCOPY WITH DEBRIDEMENT, CHONDROPLASTY;  Surgeon: Loanne Drilling, MD;  Location: [facility]S;  Service: Orthopedics;  Laterality: Right;  . Back surgery  Aug 11, 2009    lower    Prior to Admission medications   Medication Sig Start Date End Date Taking? Authorizing Provider  ALPRAZolam Prudy Feeler) 1 MG tablet Take 0.5 mg by mouth at bedtime as needed for sleep.     Historical Provider, MD  aspirin EC 81 MG tablet Take 81 mg by mouth daily.      Historical Provider, MD  atorvastatin (LIPITOR) 20 MG tablet Take  20 mg by mouth every morning.  05/20/11   Historical Provider, MD  Calcium Carbonate-Vitamin D (CALCIUM + D PO) Take 1 tablet by mouth daily as needed (for calcium).     Historical Provider, MD  cetirizine (ZYRTEC) 10 MG tablet Take 10 mg by mouth daily.    Historical Provider, MD  diclofenac sodium (VOLTAREN) 1 % GEL Apply 2 g topically daily as needed (for LEFT shoulder pain AND LEFT KNEE PAIN).     Historical Provider, MD  esomeprazole (NEXIUM) 40 MG capsule Take 40 mg by mouth daily before breakfast.    Historical Provider, MD  etanercept (ENBREL) 50 MG/ML injection Inject 50 mg into the skin once a week.    Historical Provider, MD  Glucosamine-Chondroitin (OSTEO BI-FLEX REGULAR STRENGTH PO) Take 1 tablet by mouth daily as needed (for glucosamine).     Historical Provider, MD  hydrochlorothiazide (HYDRODIURIL) 25 MG tablet Take 25 mg by mouth every morning.     Historical Provider, MD  leflunomide (ARAVA) 20 MG tablet Take 20 mg by mouth every morning.     Historical Provider, MD  methocarbamol (ROBAXIN) 500 MG tablet Take 500 mg by mouth every 6 (six) hours  as needed for muscle spasms.    Historical Provider, MD  montelukast (SINGULAIR) 10 MG tablet Take 10 mg by mouth every evening.    Historical Provider, MD  Olopatadine HCl (PATANASE) 0.6 % SOLN Place into the nose every evening.    Historical Provider, MD  predniSONE (DELTASONE) 5 MG tablet Take 5 mg by mouth every morning.  05/05/11   Historical Provider, MD  sucralfate (CARAFATE) 1 G tablet Take 1 g by mouth 4 (four) times daily as needed (for acid reflux).    Historical Provider, MD  traMADol (ULTRAM) 50 MG tablet Take 50 mg by mouth every 6 (six) hours as needed for pain.    Historical Provider, MD   KNEE EXAM antalgic gait, soft tissue tenderness over medial joint line, effusion, reduced range of motion, positive drawer sign, collateral ligaments intact  Physical Examination: General appearance - alert, well appearing, and in no  distress Mental status - alert, oriented to person, place, and time Chest - clear to auscultation, no wheezes, rales or rhonchi, symmetric air entry Heart - normal rate, regular rhythm, normal S1, S2, no murmurs, rubs, clicks or gallops Abdomen - soft, nontender, nondistended, no masses or organomegaly Neurological - alert, oriented, normal speech, no focal findings or movement disorder noted   Asessment/Plan--- Right knee medial meniscal tear- - Plan right knee arthroscopy with meniscal debridement. Procedure risks and potential comps discussed with patient who elects to proceed. Goals are decreased pain and increased function with a high likelihood of achieving both

## 2013-03-02 ENCOUNTER — Ambulatory Visit (HOSPITAL_COMMUNITY)
Admission: RE | Admit: 2013-03-02 | Discharge: 2013-03-02 | Disposition: A | Discharge status: Home / Self Care | Payer: 59 | Source: Ambulatory Visit | Attending: Orthopedic Surgery | Admitting: Orthopedic Surgery

## 2013-03-02 ENCOUNTER — Ambulatory Visit (HOSPITAL_COMMUNITY): Payer: 59 | Admitting: Certified Registered"

## 2013-03-02 ENCOUNTER — Encounter (HOSPITAL_COMMUNITY)
Admission: RE | Disposition: A | Discharge status: Home / Self Care | Payer: Self-pay | Source: Ambulatory Visit | Attending: Orthopedic Surgery

## 2013-03-02 ENCOUNTER — Encounter (HOSPITAL_COMMUNITY): Payer: 59 | Admitting: Certified Registered"

## 2013-03-02 ENCOUNTER — Encounter (HOSPITAL_COMMUNITY): Payer: Self-pay | Admitting: *Deleted

## 2013-03-02 DIAGNOSIS — Z79899 Other long term (current) drug therapy: Secondary | ICD-10-CM | POA: Insufficient documentation

## 2013-03-02 DIAGNOSIS — K219 Gastro-esophageal reflux disease without esophagitis: Secondary | ICD-10-CM | POA: Insufficient documentation

## 2013-03-02 DIAGNOSIS — M238X9 Other internal derangements of unspecified knee: Secondary | ICD-10-CM | POA: Insufficient documentation

## 2013-03-02 DIAGNOSIS — X58XXXA Exposure to other specified factors, initial encounter: Secondary | ICD-10-CM | POA: Insufficient documentation

## 2013-03-02 DIAGNOSIS — S83419A Sprain of medial collateral ligament of unspecified knee, initial encounter: Secondary | ICD-10-CM | POA: Insufficient documentation

## 2013-03-02 DIAGNOSIS — S83241D Other tear of medial meniscus, current injury, right knee, subsequent encounter: Secondary | ICD-10-CM

## 2013-03-02 DIAGNOSIS — Z7982 Long term (current) use of aspirin: Secondary | ICD-10-CM | POA: Insufficient documentation

## 2013-03-02 DIAGNOSIS — M069 Rheumatoid arthritis, unspecified: Secondary | ICD-10-CM | POA: Insufficient documentation

## 2013-03-02 DIAGNOSIS — M2240 Chondromalacia patellae, unspecified knee: Secondary | ICD-10-CM | POA: Insufficient documentation

## 2013-03-02 DIAGNOSIS — Z8673 Personal history of transient ischemic attack (TIA), and cerebral infarction without residual deficits: Secondary | ICD-10-CM | POA: Insufficient documentation

## 2013-03-02 DIAGNOSIS — M1990 Unspecified osteoarthritis, unspecified site: Secondary | ICD-10-CM | POA: Insufficient documentation

## 2013-03-02 DIAGNOSIS — I10 Essential (primary) hypertension: Secondary | ICD-10-CM | POA: Insufficient documentation

## 2013-03-02 DIAGNOSIS — IMO0002 Reserved for concepts with insufficient information to code with codable children: Secondary | ICD-10-CM | POA: Insufficient documentation

## 2013-03-02 HISTORY — PX: KNEE ARTHROSCOPY: SHX127

## 2013-03-02 SURGERY — ARTHROSCOPY, KNEE
Anesthesia: General | Site: Knee | Laterality: Right

## 2013-03-02 MED ORDER — LACTATED RINGERS IR SOLN
Status: DC | PRN
  Administered 2013-03-02: 6000 mL

## 2013-03-02 MED ORDER — ONDANSETRON HCL 4 MG/2ML IJ SOLN
INTRAMUSCULAR | Status: AC
  Filled 2013-03-02: qty 2

## 2013-03-02 MED ORDER — METHOCARBAMOL 500 MG PO TABS
500.0000 mg | ORAL_TABLET | Freq: Four times a day (QID) | ORAL | Status: DC | PRN
  Administered 2013-03-02: 500 mg via ORAL
  Filled 2013-03-02: qty 1

## 2013-03-02 MED ORDER — LIDOCAINE HCL (CARDIAC) 20 MG/ML IV SOLN
INTRAVENOUS | Status: DC | PRN
  Administered 2013-03-02: 50 mg via INTRAVENOUS

## 2013-03-02 MED ORDER — FENTANYL CITRATE 0.05 MG/ML IJ SOLN
INTRAMUSCULAR | Status: AC
  Filled 2013-03-02: qty 2

## 2013-03-02 MED ORDER — ONDANSETRON HCL 4 MG/2ML IJ SOLN
INTRAMUSCULAR | Status: DC | PRN
  Administered 2013-03-02: 4 mg via INTRAVENOUS

## 2013-03-02 MED ORDER — PROPOFOL 10 MG/ML IV BOLUS
INTRAVENOUS | Status: DC | PRN
  Administered 2013-03-02: 180 mg via INTRAVENOUS

## 2013-03-02 MED ORDER — METHOCARBAMOL 500 MG PO TABS: 500.0000 mg | ORAL_TABLET | Freq: Four times a day (QID) | ORAL | Status: DC

## 2013-03-02 MED ORDER — FENTANYL CITRATE 0.05 MG/ML IJ SOLN
INTRAMUSCULAR | Status: DC | PRN
  Administered 2013-03-02: 25 ug via INTRAVENOUS
  Administered 2013-03-02: 50 ug via INTRAVENOUS
  Administered 2013-03-02: 25 ug via INTRAVENOUS

## 2013-03-02 MED ORDER — BUPIVACAINE-EPINEPHRINE PF 0.25-1:200000 % IJ SOLN
INTRAMUSCULAR | Status: AC
Start: 2013-03-02 — End: 2013-03-02
  Filled 2013-03-02: qty 30

## 2013-03-02 MED ORDER — BUPIVACAINE LIPOSOME 1.3 % IJ SUSP
20.0000 mL | Freq: Once | INTRAMUSCULAR | Status: DC
  Filled 2013-03-02: qty 20

## 2013-03-02 MED ORDER — DEXAMETHASONE SODIUM PHOSPHATE 10 MG/ML IJ SOLN
INTRAMUSCULAR | Status: AC
  Filled 2013-03-02: qty 1

## 2013-03-02 MED ORDER — BUPIVACAINE-EPINEPHRINE 0.25% -1:200000 IJ SOLN
INTRAMUSCULAR | Status: DC | PRN
  Administered 2013-03-02: 20 mL

## 2013-03-02 MED ORDER — SCOPOLAMINE 1 MG/3DAYS TD PT72
MEDICATED_PATCH | TRANSDERMAL | Status: AC
Start: 2013-03-02 — End: 2013-03-02
  Filled 2013-03-02: qty 1

## 2013-03-02 MED ORDER — DEXAMETHASONE SODIUM PHOSPHATE 10 MG/ML IJ SOLN
INTRAMUSCULAR | Status: DC | PRN
  Administered 2013-03-02: 10 mg via INTRAVENOUS

## 2013-03-02 MED ORDER — MIDAZOLAM HCL 5 MG/5ML IJ SOLN
INTRAMUSCULAR | Status: DC | PRN
  Administered 2013-03-02: 2 mg via INTRAVENOUS

## 2013-03-02 MED ORDER — CEFAZOLIN SODIUM-DEXTROSE 2-3 GM-% IV SOLR
2.0000 g | INTRAVENOUS | Status: AC
  Administered 2013-03-02: 2 g via INTRAVENOUS

## 2013-03-02 MED ORDER — HYDROCODONE-ACETAMINOPHEN 5-325 MG PO TABS
1.0000 | ORAL_TABLET | Freq: Four times a day (QID) | ORAL | Status: DC | PRN
  Administered 2013-03-02: 1 via ORAL
  Filled 2013-03-02: qty 1

## 2013-03-02 MED ORDER — MIDAZOLAM HCL 2 MG/2ML IJ SOLN
INTRAMUSCULAR | Status: AC
  Filled 2013-03-02: qty 2

## 2013-03-02 MED ORDER — SODIUM CHLORIDE 0.9 % IV SOLN: INTRAVENOUS | Status: DC

## 2013-03-02 MED ORDER — SCOPOLAMINE 1 MG/3DAYS TD PT72
MEDICATED_PATCH | TRANSDERMAL | Status: DC | PRN
  Administered 2013-03-02: 1.5 mg via TRANSDERMAL

## 2013-03-02 MED ORDER — ACETAMINOPHEN 10 MG/ML IV SOLN
1000.0000 mg | Freq: Once | INTRAVENOUS | Status: AC
  Administered 2013-03-02: 1000 mg via INTRAVENOUS
  Filled 2013-03-02: qty 100

## 2013-03-02 MED ORDER — HYDROCODONE-ACETAMINOPHEN 5-325 MG PO TABS: 1.0000 | ORAL_TABLET | Freq: Four times a day (QID) | ORAL | Status: DC | PRN

## 2013-03-02 MED ORDER — LIDOCAINE HCL (CARDIAC) 20 MG/ML IV SOLN
INTRAVENOUS | Status: AC
Start: 2013-03-02 — End: 2013-03-02
  Filled 2013-03-02: qty 5

## 2013-03-02 MED ORDER — FENTANYL CITRATE 0.05 MG/ML IJ SOLN
25.0000 ug | INTRAMUSCULAR | Status: DC | PRN
  Administered 2013-03-02 (×2): 25 ug via INTRAVENOUS
  Administered 2013-03-02: 50 ug via INTRAVENOUS
  Administered 2013-03-02 (×2): 25 ug via INTRAVENOUS

## 2013-03-02 MED ORDER — LACTATED RINGERS IV SOLN
INTRAVENOUS | Status: DC
  Administered 2013-03-02 (×2): 1000 mL via INTRAVENOUS

## 2013-03-02 MED ORDER — PROPOFOL 10 MG/ML IV BOLUS
INTRAVENOUS | Status: AC
  Filled 2013-03-02: qty 20

## 2013-03-02 MED ORDER — CEFAZOLIN SODIUM-DEXTROSE 2-3 GM-% IV SOLR
INTRAVENOUS | Status: AC
  Filled 2013-03-02: qty 50

## 2013-03-02 MED ORDER — PROMETHAZINE HCL 25 MG/ML IJ SOLN: 6.2500 mg | INTRAMUSCULAR | Status: DC | PRN

## 2013-03-02 SURGICAL SUPPLY — 27 items
BLADE 4.2CUDA (BLADE) ×2 IMPLANT
BNDG CMPR MD 5X2 ELC HKLP STRL (GAUZE/BANDAGES/DRESSINGS) ×1
BNDG ELASTIC 2 VLCR STRL LF (GAUZE/BANDAGES/DRESSINGS) ×1 IMPLANT
CLOTH BEACON ORANGE TIMEOUT ST (SAFETY) ×2 IMPLANT
COUNTER NEEDLE 20 DBL MAG RED (NEEDLE) ×2 IMPLANT
CUFF TOURN SGL QUICK 34 (TOURNIQUET CUFF) ×2
CUFF TRNQT CYL 34X4X40X1 (TOURNIQUET CUFF) ×1 IMPLANT
DRAPE U-SHAPE 47X51 STRL (DRAPES) ×2 IMPLANT
DRSG EMULSION OIL 3X3 NADH (GAUZE/BANDAGES/DRESSINGS) ×2 IMPLANT
DURAPREP 26ML APPLICATOR (WOUND CARE) ×2 IMPLANT
GLOVE BIO SURGEON STRL SZ8 (GLOVE) ×2 IMPLANT
GLOVE BIOGEL PI IND STRL 8 (GLOVE) ×1 IMPLANT
GLOVE BIOGEL PI INDICATOR 8 (GLOVE) ×1
GOWN PREVENTION PLUS LG XLONG (DISPOSABLE) ×2 IMPLANT
MANIFOLD NEPTUNE II (INSTRUMENTS) ×2 IMPLANT
PACK ARTHROSCOPY WL (CUSTOM PROCEDURE TRAY) ×2 IMPLANT
PACK ICE MAXI GEL EZY WRAP (MISCELLANEOUS) ×6 IMPLANT
PADDING CAST COTTON 6X4 STRL (CAST SUPPLIES) ×3 IMPLANT
POSITIONER SURGICAL ARM (MISCELLANEOUS) ×2 IMPLANT
SET ARTHROSCOPY TUBING (MISCELLANEOUS) ×2
SET ARTHROSCOPY TUBING LN (MISCELLANEOUS) ×1 IMPLANT
SPONGE GAUZE 4X4 12PLY (GAUZE/BANDAGES/DRESSINGS) ×1 IMPLANT
SUT ETHILON 4 0 PS 2 18 (SUTURE) ×2 IMPLANT
SYR 20CC LL (SYRINGE) ×2 IMPLANT
TOWEL OR 17X26 10 PK STRL BLUE (TOWEL DISPOSABLE) ×2 IMPLANT
WAND 90 DEG TURBOVAC W/CORD (SURGICAL WAND) ×2 IMPLANT
WRAP KNEE MAXI GEL POST OP (GAUZE/BANDAGES/DRESSINGS) ×2 IMPLANT

## 2013-03-02 NOTE — Anesthesia Preprocedure Evaluation (Signed)
Anesthesia Evaluation  Patient identified by MRN, date of birth, ID band Patient awake    Reviewed: Allergy & Precautions, H&P , NPO status , Patient's Chart, lab work & pertinent test results  History of Anesthesia Complications (+) PONV and history of anesthetic complications  Airway Mallampati: II TM Distance: >3 FB Neck ROM: Full    Dental no notable dental hx.    Pulmonary pneumonia -, resolved,  breath sounds clear to auscultation  Pulmonary exam normal       Cardiovascular Exercise Tolerance: Good hypertension, Pt. on medications negative cardio ROS  Rhythm:Regular Rate:Normal     Neuro/Psych Anxiety CVA, No Residual Symptoms    GI/Hepatic Neg liver ROS, hiatal hernia, GERD-  Medicated,  Endo/Other  negative endocrine ROS  Renal/GU negative Renal ROS  negative genitourinary   Musculoskeletal negative musculoskeletal ROS (+)   Abdominal (+) + obese,   Peds negative pediatric ROS (+)  Hematology negative hematology ROS (+)   Anesthesia Other Findings   Reproductive/Obstetrics negative OB ROS                           Anesthesia Physical Anesthesia Plan  ASA: III  Anesthesia Plan: General   Post-op Pain Management:    Induction: Intravenous  Airway Management Planned: LMA  Additional Equipment:   Intra-op Plan:   Post-operative Plan: Extubation in OR  Informed Consent: I have reviewed the patients History and Physical, chart, labs and discussed the procedure including the risks, benefits and alternatives for the proposed anesthesia with the patient or authorized representative who has indicated his/her understanding and acceptance.   Dental advisory given  Plan Discussed with: CRNA  Anesthesia Plan Comments:         Anesthesia Quick Evaluation

## 2013-03-02 NOTE — Progress Notes (Signed)
Report to Judy

## 2013-03-02 NOTE — Op Note (Signed)
Preoperative diagnosis-  Right knee medial meniscal tear  Postoperative diagnosis Right- knee medial meniscal tear   Procedure- Right knee arthroscopy with medial meniscal debridement    Surgeon- Gus Rankin. Edwinna Rochette, MD  Anesthesia-General  EBL-  minimal Complications- None  Condition- PACU - hemodynamically stable.  Brief clinical note- -Angie Olson is a 62 y.o.  female with a several month history of right knee pain and mechanical symptoms. She had a recent arthroscopy and re-injured her knee.Exam and history suggested medial meniscal tear confirmed by MRI. The patient presents now for arthroscopy and debridement   Procedure in detail -       After successful administration of General anesthetic, a tourmiquet is placed high on the Right  thigh and the Right lower extremity is prepped and draped in the usual sterile fashion. Time out is performed by the surgical team. Standard superomedial and inferolateral portal sites are marked and incisions made with an 11 blade. The inflow cannula is passed through the superomedial portal and camera through the inferolateral portal and inflow is initiated. Arthroscopic visualization proceeds.      The undersurface of the patella and trochlea are visualized and there is mild chondromalacia but no unstable defects.. The medial and lateral gutters are visualized and there are  no loose bodies. Flexion and valgus force is applied to the knee and the medial compartment is entered. A spinal needle is passed into the joint through the site marked for the inferomedial portal. A small incision is made and the dilator passed into the joint. The findings for the medial compartment are bucket handle tear medial meniscus and Grade II chondromalacia medial femoral condyle . The tear is debrided to a stable base with baskets and a shaver and sealed off with the Arthrocare. The area of chondromalacia is stable and does not need debridement.    The intercondylar notch  is visualized and the ACL is torn. The stump is bulbous and in an area of impingement and is thus debrided. The lateral compartment is entered and the findings are normal .       The joint is again inspected and there are no other tears, defects or loose bodies identified. The arthroscopic equipment is then removed from the inferior portals which are closed with interrupted 4-0 nylon. 20 ml of .25% Marcaine with epinephrine are injected through the inflow cannula and the cannula is then removed and the portal closed with nylon. The incisions are cleaned and dried and a bulky sterile dressing is applied. The patient is then awakened and transported to recovery in stable condition.   03/02/2013, 1:47 PM

## 2013-03-02 NOTE — Anesthesia Postprocedure Evaluation (Signed)
Anesthesia Post Note  Patient: Angie Olson  Procedure(s) Performed: Procedure(s) (LRB): RIGHT ARTHROSCOPY KNEE WITH MEDIAL MENISCAL  DEBRIDEMENT (Right)  Anesthesia type: General  Patient location: PACU  Post pain: Pain level controlled  Post assessment: Post-op Vital signs reviewed  Last Vitals:  Filed Vitals:   03/02/13 1548  BP: 153/83  Pulse: 79  Temp:   Resp: 16    Post vital signs: Reviewed  Level of consciousness: sedated  Complications: No apparent anesthesia complications

## 2013-03-02 NOTE — Interval H&P Note (Signed)
History and Physical Interval Note:  03/02/2013 12:56 PM  Angie Olson  has presented today for surgery, with the diagnosis of RIGHT KNEE RECURRENT MEDIAL MENISCUS TEAR  The various methods of treatment have been discussed with the patient and family. After consideration of risks, benefits and other options for treatment, the patient has consented to  Procedure(s): RIGHT ARTHROSCOPY KNEE WITH DEBRIDEMENT (Right) as a surgical intervention .  The patient's history has been reviewed, patient examined, no change in status, stable for surgery.  I have reviewed the patient's chart and labs.  Questions were answered to the patient's satisfaction.     Loanne Drilling

## 2013-03-02 NOTE — Transfer of Care (Signed)
Immediate Anesthesia Transfer of Care Note  Patient: Angie Olson  Procedure(s) Performed: Procedure(s): RIGHT ARTHROSCOPY KNEE WITH MEDIAL MENISCAL  DEBRIDEMENT (Right)  Patient Location: PACU  Anesthesia Type:General  Level of Consciousness: awake, alert  and oriented  Airway & Oxygen Therapy: Patient Spontanous Breathing and Patient connected to face mask oxygen  Post-op Assessment: Report given to PACU RN and Post -op Vital signs reviewed and stable  Post vital signs: Reviewed and stable  Complications: No apparent anesthesia complications

## 2013-03-03 ENCOUNTER — Encounter (HOSPITAL_COMMUNITY): Payer: Self-pay | Admitting: Orthopedic Surgery

## 2013-05-26 ENCOUNTER — Other Ambulatory Visit: Payer: Self-pay | Admitting: Orthopedic Surgery

## 2013-06-10 ENCOUNTER — Encounter (HOSPITAL_COMMUNITY): Payer: Self-pay | Admitting: Pharmacy Technician

## 2013-06-13 ENCOUNTER — Encounter (HOSPITAL_COMMUNITY): Payer: Self-pay

## 2013-06-13 ENCOUNTER — Encounter (HOSPITAL_COMMUNITY)
Admission: RE | Admit: 2013-06-13 | Discharge: 2013-06-13 | Disposition: A | Discharge status: Home / Self Care | Payer: 59 | Source: Ambulatory Visit | Attending: Orthopedic Surgery | Admitting: Orthopedic Surgery

## 2013-06-13 DIAGNOSIS — Z01812 Encounter for preprocedural laboratory examination: Secondary | ICD-10-CM | POA: Insufficient documentation

## 2013-06-13 HISTORY — DX: Shortness of breath: R06.02

## 2013-06-13 HISTORY — DX: Pure hypercholesterolemia, unspecified: E78.00

## 2013-06-13 HISTORY — DX: Other allergy status, other than to drugs and biological substances: Z91.09

## 2013-06-13 LAB — URINALYSIS, ROUTINE W REFLEX MICROSCOPIC
Bilirubin Urine: NEGATIVE
Glucose, UA: NEGATIVE mg/dL
Hgb urine dipstick: NEGATIVE
Ketones, ur: NEGATIVE mg/dL
LEUKOCYTES UA: NEGATIVE
Nitrite: NEGATIVE
Protein, ur: NEGATIVE mg/dL
Specific Gravity, Urine: 1.026 (ref 1.005–1.030)
UROBILINOGEN UA: 0.2 mg/dL (ref 0.0–1.0)
pH: 5.5 (ref 5.0–8.0)

## 2013-06-13 LAB — COMPREHENSIVE METABOLIC PANEL
ALBUMIN: 3.9 g/dL (ref 3.5–5.2)
ALT: 32 U/L (ref 0–35)
AST: 22 U/L (ref 0–37)
Alkaline Phosphatase: 49 U/L (ref 39–117)
BUN: 16 mg/dL (ref 6–23)
CALCIUM: 10.1 mg/dL (ref 8.4–10.5)
CO2: 32 mEq/L (ref 19–32)
Chloride: 101 mEq/L (ref 96–112)
Creatinine, Ser: 0.78 mg/dL (ref 0.50–1.10)
GFR calc non Af Amer: 88 mL/min — ABNORMAL LOW (ref >90)
GLUCOSE: 110 mg/dL — AB (ref 70–99)
Potassium: 3.7 mEq/L (ref 3.7–5.3)
Sodium: 145 mEq/L (ref 137–147)
TOTAL PROTEIN: 6.6 g/dL (ref 6.0–8.3)
Total Bilirubin: 0.3 mg/dL (ref 0.3–1.2)

## 2013-06-13 LAB — SURGICAL PCR SCREEN
MRSA, PCR: NEGATIVE
STAPHYLOCOCCUS AUREUS: NEGATIVE

## 2013-06-13 LAB — PROTIME-INR
INR: 1 (ref 0.00–1.49)
PROTHROMBIN TIME: 13 s (ref 11.6–15.2)

## 2013-06-13 LAB — CBC
HEMATOCRIT: 40.3 % (ref 36.0–46.0)
HEMOGLOBIN: 13.6 g/dL (ref 12.0–15.0)
MCH: 28.4 pg (ref 26.0–34.0)
MCHC: 33.7 g/dL (ref 30.0–36.0)
MCV: 84.1 fL (ref 78.0–100.0)
Platelets: 301 10*3/uL (ref 150–400)
RBC: 4.79 MIL/uL (ref 3.87–5.11)
RDW: 13.6 % (ref 11.5–15.5)
WBC: 6.3 10*3/uL (ref 4.0–10.5)

## 2013-06-13 LAB — APTT: aPTT: 27 seconds (ref 24–37)

## 2013-06-13 NOTE — Progress Notes (Signed)
Pt stated "last surgery, many nurses tried to get IV with no success. Would like IV team first before anyone else tries" Please address.

## 2013-06-13 NOTE — Progress Notes (Signed)
LOV note 05/24/13 Dr. Maxwell Caul on chart, Chest x-ray 10/11/12 on EPIC, EKG 10/15/12 on EPIC

## 2013-06-13 NOTE — Patient Instructions (Addendum)
[place]  06/13/2013   Your procedure is scheduled on: 06/24/13  Report to [facility] at 10:00 AM.  Call this number if you have problems the morning of surgery [phone]   Remember: Please call Dr. Wynelle Link about leflunomide.    Do not eat food or drink liquids After Midnight.     Take these medicines the morning of surgery with A SIP OF WATER: atorvastatin, zyrtec, nexium, prednisone, oxycodone if needed, nose spray if needed   Do not wear jewelry, make-up or nail polish.  Do not wear lotions, powders, or perfumes. You may wear deodorant.  Do not shave 48 hours prior to surgery. Men may shave face and neck.  Do not bring valuables to the hospital.  Contacts, dentures or bridgework may not be worn into surgery.  Leave suitcase in the car. After surgery it may be brought to your room.  For patients admitted to the hospital, checkout time is 11:00 AM the day of discharge.    Please read over the following fact sheets that you were given:[facility] preparing for surgery sheet, MRSA Information, incentive spirometry fact sheet, blood fact sheet Paulette Blanch, RN  pre op nurse call if needed [phone]    FAILURE TO [place]   Patient Signature: ___________________________________________

## 2013-06-21 NOTE — H&P (Signed)
TOTAL KNEE ADMISSION H&P  Patient is being admitted for right total knee arthroplasty.  Subjective:  Chief Complaint:right knee pain.  HPI: Angie Olson, 63 y.o. female, has a history of pain and functional disability in the right knee due to arthritis and has failed non-surgical conservative treatments for greater than 12 weeks to includeNSAID's and/or analgesics, corticosteriod injections and activity modification.  Onset of symptoms was gradual, starting 3 years ago with gradually worsening course since that time. The patient noted prior procedures on the knee to include  arthroscopy and menisectomy on the right knee(s).  Patient currently rates pain in the right knee(s) at 7 out of 10 with activity. Patient has night pain, worsening of pain with activity and weight bearing, pain that interferes with activities of daily living, pain with passive range of motion, crepitus and joint swelling.  Patient has evidence of periarticular osteophytes and joint space narrowing by imaging studies.  There is no active infection.  Patient Active Problem List   Diagnosis Date Noted  . Acute medial meniscal tear 10/13/2012  . SINUSITIS 12/24/2007  . PULMONARY NODULE 12/24/2007  . COUGH 12/24/2007  . COLONIC POLYPS, ADENOMATOUS, HX OF 10/19/2007  . ALLERGIC RHINITIS 12/07/2006  . GERD 12/07/2006   Past Medical History  Diagnosis Date  . Hypertension   . Acid reflux   . Bronchitis     CHRONIC   . Anxiety     PT'S SON DIED 07/12/2010  . Pneumonia     X 3  - LAST TIME WAS 07/12/2011  . H/O hiatal hernia   . Arthritis     RA AND OA  . Pain     RIGHT KNEE - TORN MENISCUS AND ACL  . Stroke 03/2011    TIA--PT EXPERIENCED NUMBNESS RT HAND AND ARM , HEADACHE AND NAUSEA. NO RESIDUAL PROBLEMS  . Borderline hypercholesterolemia     on medication for TIA  . Environmental allergies   . Shortness of breath     with activity  . PONV (postoperative nausea and vomiting) 09-2012    sore throat after ankle surgery 6  yrs ago and severe ponv 09-2012    Past Surgical History  Procedure Laterality Date  . Ankle surgery  ~10 years ago    left  . Shoulder surgery Left ~14 years ago    "scope"  . Thumb surgery Left ~14 years ago    x2  . Colonoscopy  2376,2831, 12-Jul-2011  . Trigger finger release  ~6 years ago    X2 ON RIGHT HAND  . Knee arthroscopy Right 10/13/2012    Procedure: RIGHT KNEE ARTHROSCOPY WITH DEBRIDEMENT, CHONDROPLASTY;  Surgeon: Gearlean Alf, MD;  Location: [facility];  Service: Orthopedics;  Laterality: Right;  . Back surgery  2009/07/11    lower  . Knee arthroscopy Right 03/02/2013    Procedure: RIGHT ARTHROSCOPY KNEE WITH MEDIAL MENISCAL  DEBRIDEMENT;  Surgeon: Gearlean Alf, MD;  Location: [facility];  Service: Orthopedics;  Laterality: Right;  . Cholecystectomy  ~15 years ago     Current outpatient prescriptions ALPRAZolam (XANAX) 1 MG tablet, Take 0.5 mg by mouth at bedtime. , Disp: , Rfl: ;   aspirin EC 81 MG tablet, Take 81 mg by mouth daily.  , Disp: , Rfl: ;   atorvastatin (LIPITOR) 20 MG tablet, Take 20 mg by mouth every morning. , Disp: , Rfl: ;   azelastine (ASTELIN) 137 MCG/SPRAY nasal spray, Place 1 spray into both nostrils at bedtime. Use in each nostril  as directed, Disp: , Rfl:  Calcium Carbonate-Vitamin D (CALCIUM + D PO), Take 1 tablet by mouth daily as needed (for calcium). , Disp: , Rfl: ;   cetirizine (ZYRTEC) 10 MG tablet, Take 10 mg by mouth daily., Disp: , Rfl: ;   diclofenac sodium (VOLTAREN) 1 % GEL, Apply 1 application topically 4 (four) times daily as needed (Shoulder/back Pain)., Disp: , Rfl: ;   esomeprazole (NEXIUM) 40 MG capsule, Take 40 mg by mouth daily before breakfast., Disp: , Rfl:  etanercept (ENBREL) 50 MG/ML injection, Inject 50 mg into the skin once a week., Disp: , Rfl: ;   Glucosamine-Chondroitin (OSTEO BI-FLEX REGULAR STRENGTH PO), Take 1 tablet by mouth daily as needed (for glucosamine). , Disp: , Rfl: ;   hydrochlorothiazide (HYDRODIURIL) 25 MG tablet,  Take 25 mg by mouth every morning. , Disp: , Rfl: ;   leflunomide (ARAVA) 20 MG tablet, Take 20 mg by mouth every morning. , Disp: , Rfl:  montelukast (SINGULAIR) 10 MG tablet, Take 10 mg by mouth every evening., Disp: , Rfl: ;   Olopatadine HCl (PATANASE) 0.6 % SOLN, Place 1 puff into the nose daily., Disp: , Rfl: ;   oxyCODONE (OXY IR/ROXICODONE) 5 MG immediate release tablet, Take 5-10 mg by mouth every 4 (four) hours as needed for severe pain., Disp: , Rfl: ;   predniSONE (DELTASONE) 5 MG tablet, Take 5 mg by mouth every morning. , Disp: , Rfl:  sodium chloride (OCEAN) 0.65 % SOLN nasal spray, Place 1 spray into both nostrils as needed for congestion., Disp: , Rfl: ;   sucralfate (CARAFATE) 1 G tablet, Take 1 g by mouth 4 (four) times daily as needed (for acid reflux)., Disp: , Rfl: ;   traMADol (ULTRAM) 50 MG tablet, Take 50 mg by mouth every 6 (six) hours as needed for pain., Disp: , Rfl:   Allergies  Allergen Reactions  . Pseudoephedrine     Makes jittery   . Vicodin [Hydrocodone-Acetaminophen]     "Makes me feel weird"    History  Substance Use Topics  . Smoking status: Never Smoker   . Smokeless tobacco: Never Used  . Alcohol Use: Yes     Comment: RARE     Family History  Problem Relation Age of Onset  . Colon cancer Neg Hx      Review of Systems  Constitutional: Positive for malaise/fatigue and diaphoresis. Negative for fever, chills and weight loss.  HENT: Negative.   Eyes: Negative.   Respiratory: Positive for shortness of breath. Negative for cough, hemoptysis, sputum production and wheezing.        SOB with exertion  Cardiovascular: Negative.   Gastrointestinal: Positive for heartburn, nausea and diarrhea. Negative for vomiting, abdominal pain, constipation and melena.  Genitourinary: Negative.   Musculoskeletal: Positive for back pain and joint pain. Negative for falls, myalgias and neck pain.       Right knee pain  Skin: Negative.   Neurological: Negative.   Negative for weakness.  Endo/Heme/Allergies: Positive for environmental allergies. Negative for polydipsia. Does not bruise/bleed easily.  Psychiatric/Behavioral: Negative.     Objective:  Physical Exam  Constitutional: She is oriented to person, place, and time. She appears well-developed and well-nourished. No distress.  HENT:  Head: Normocephalic and atraumatic.  Right Ear: External ear normal.  Left Ear: External ear normal.  Nose: Nose normal.  Mouth/Throat: Oropharynx is clear and moist.  Eyes: Conjunctivae and EOM are normal.  Neck: Normal range of motion.  Cardiovascular:  Normal rate, regular rhythm, normal heart sounds and intact distal pulses.   No murmur heard. Respiratory: Effort normal and breath sounds normal. No respiratory distress. She has no wheezes.  GI: Soft. Bowel sounds are normal. She exhibits no distension. There is no tenderness.  Musculoskeletal:       Right hip: Normal.       Left hip: Normal.       Right knee: She exhibits decreased range of motion and swelling. She exhibits no effusion and no erythema. Tenderness found. Medial joint line and lateral joint line tenderness noted.       Left knee: Normal.       Right lower leg: She exhibits no tenderness and no swelling.       Left lower leg: She exhibits no tenderness and no swelling.  Her right knee shows slight swelling. Her range of motion of the right knee is about 5 to 115. There is marked crepitus on range of motion. She is tender medial greater than lateral with no instability.  Neurological: She is oriented to person, place, and time. She has normal strength and normal reflexes. No sensory deficit.  Skin: No rash noted. She is not diaphoretic. No erythema.  Psychiatric: She has a normal mood and affect. Her behavior is normal.    Vitals Weight: 190 lb Height: 65 in Body Surface Area: 1.99 m Body Mass Index: 31.62 kg/m Pulse: 72 (Regular) BP: 138/72 (Sitting, Left Arm,  Standard)  Imaging Review Plain radiographs demonstrate severe degenerative joint disease of the right knee(s). The overall alignment ismild varus. The bone quality appears to be good for age and reported activity level.  Assessment/Plan:  End stage arthritis, right knee   The patient history, physical examination, clinical judgment of the provider and imaging studies are consistent with end stage degenerative joint disease of the right knee(s) and total knee arthroplasty is deemed medically necessary. The treatment options including medical management, injection therapy arthroscopy and arthroplasty were discussed at length. The risks and benefits of total knee arthroplasty were presented and reviewed. The risks due to aseptic loosening, infection, stiffness, patella tracking problems, thromboembolic complications and other imponderables were discussed. The patient acknowledged the explanation, agreed to proceed with the plan and consent was signed. Patient is being admitted for inpatient treatment for surgery, pain control, PT, OT, prophylactic antibiotics, VTE prophylaxis, progressive ambulation and ADL's and discharge planning. The patient is planning to be discharged home with home health services    [place], [place]

## 2013-06-22 ENCOUNTER — Encounter: Payer: Self-pay | Admitting: General Surgery

## 2013-06-23 ENCOUNTER — Ambulatory Visit (INDEPENDENT_AMBULATORY_CARE_PROVIDER_SITE_OTHER): Payer: 59 | Admitting: Cardiology

## 2013-06-23 ENCOUNTER — Encounter: Payer: Self-pay | Admitting: Cardiology

## 2013-06-23 VITALS — BP 142/88 | HR 108 | Ht 65.0 in | Wt 194.0 lb

## 2013-06-23 DIAGNOSIS — R0989 Other specified symptoms and signs involving the circulatory and respiratory systems: Secondary | ICD-10-CM

## 2013-06-23 DIAGNOSIS — R0609 Other forms of dyspnea: Secondary | ICD-10-CM | POA: Insufficient documentation

## 2013-06-23 DIAGNOSIS — I10 Essential (primary) hypertension: Secondary | ICD-10-CM | POA: Insufficient documentation

## 2013-06-23 NOTE — Patient Instructions (Signed)
Your physician recommends that you continue on your current medications as directed. Please refer to the Current Medication list given to you today.  Your physician has requested that you have an echocardiogram. Echocardiography is a painless test that uses sound waves to create images of your heart. It provides your doctor with information about the size and shape of your heart and how well your heart's chambers and valves are working. This procedure takes approximately one hour. There are no restrictions for this procedure.  Your physician has requested that you have a lexiscan myoview. For further information please visit [email or web address]. Please follow instruction sheet, as given.  Your physician recommends that you schedule a follow-up appointment As Needed

## 2013-06-23 NOTE — Progress Notes (Signed)
[address], [place], [place]  [address] Phone: [phone] Fax:  [phone]  Date:  06/23/2013   ID:  Angie Olson, DOB 22-Jul-1950, MRN [id number]  PCP:  Horton Finer, MD  Cardiologist:  Fransico Him, MD     History of Present Illness: Angie Olson is a 63 y.o. female with a history of HTN, anxiety, chronic arthitis who recently saw Dr. Maxwell Caul and was complaining of fatigue since her last knee surgery in December 2014 and is supposed to have a TKR this month.  She has complained of intermittent DOE that has been off and on for the past few years but seems to have worsened in the past few months.  She has not been able to do any physical activity due to chronic pain.   She has bad indigestion and gets severe CP when she gets GERD.  She describes the pain as sharp and stabbing that goes through to her back.  She is now referred for evaluation of SOB.  She has not had any exertional CP.  She occasionally has some ankle edema.  She denies any syncope but has had some dizziness when moving too fast.  She denies any palpitations.       Wt Readings from Last 3 Encounters:  06/23/13 194 lb (87.998 kg)  06/13/13 196 lb (88.905 kg)  02/28/13 189 lb 9.6 oz (86.002 kg)     Past Medical History  Diagnosis Date  . Hypertension   . Acid reflux   . Bronchitis     CHRONIC   . Anxiety     PT'S SON DIED 07-04-2010  . Pneumonia     X 3  - LAST TIME WAS 07/04/2011  . H/O hiatal hernia   . Arthritis     RA AND OA  . Pain     RIGHT KNEE - TORN MENISCUS AND ACL  . Stroke 03/2011    TIA--PT EXPERIENCED NUMBNESS RT HAND AND ARM , HEADACHE AND NAUSEA. NO RESIDUAL PROBLEMS  . Borderline hypercholesterolemia     on medication for TIA  . Environmental allergies   . Shortness of breath     with activity  . PONV (postoperative nausea and vomiting) 09-2012    sore throat after ankle surgery 6 yrs ago and severe ponv 09-2012  . Allergic rhinitis   . GERD (gastroesophageal reflux  disease)     on PPI and carafte prn  . IBS (irritable bowel syndrome)   . LBP (low back pain)   . Pulmonary nodule seen on imaging study   . OA (osteoarthritis)     Left Shoulder  . Colon polyp     Current Outpatient Prescriptions  Medication Sig Dispense Refill  . ALPRAZolam (XANAX) 1 MG tablet Take 0.5 mg by mouth at bedtime.       Marland Kitchen aspirin EC 81 MG tablet Take 81 mg by mouth daily.        Marland Kitchen atorvastatin (LIPITOR) 20 MG tablet Take 20 mg by mouth every morning.       Marland Kitchen azelastine (ASTELIN) 137 MCG/SPRAY nasal spray Place 1 spray into both nostrils at bedtime. Use in each nostril as directed      . Calcium Carbonate-Vitamin D (CALCIUM + D PO) Take 1 tablet by mouth daily as needed (for calcium).       . cetirizine (ZYRTEC) 10 MG tablet Take 10 mg by mouth daily.      . diclofenac sodium (VOLTAREN) 1 % GEL  Apply 1 application topically 4 (four) times daily as needed (Shoulder/back Pain).      Marland Kitchen esomeprazole (NEXIUM) 40 MG capsule Take 40 mg by mouth daily before breakfast.      . etanercept (ENBREL) 50 MG/ML injection Inject 50 mg into the skin once a week.      . Glucosamine-Chondroitin (OSTEO BI-FLEX REGULAR STRENGTH PO) Take 1 tablet by mouth daily as needed (for glucosamine).       . hydrochlorothiazide (HYDRODIURIL) 25 MG tablet Take 25 mg by mouth every morning.       . leflunomide (ARAVA) 20 MG tablet Take 20 mg by mouth every morning.       . montelukast (SINGULAIR) 10 MG tablet Take 10 mg by mouth every evening.      . Olopatadine HCl (PATANASE) 0.6 % SOLN Place 1 puff into the nose daily.      Marland Kitchen oxyCODONE (OXY IR/ROXICODONE) 5 MG immediate release tablet Take 5-10 mg by mouth every 4 (four) hours as needed for severe pain.      . predniSONE (DELTASONE) 5 MG tablet Take 5 mg by mouth every morning.       . sodium chloride (OCEAN) 0.65 % SOLN nasal spray Place 1 spray into both nostrils as needed for congestion.      . sucralfate (CARAFATE) 1 G tablet Take 1 g by mouth 4 (four)  times daily as needed (for acid reflux).      . traMADol (ULTRAM) 50 MG tablet Take 50 mg by mouth every 6 (six) hours as needed for pain.       No current facility-administered medications for this visit.    Allergies:    Allergies  Allergen Reactions  . Methotrexate Derivatives     Recurrent infection  . Pseudoephedrine     Makes jittery   . Vicodin [Hydrocodone-Acetaminophen]     "Makes me feel weird"    Social History:  The patient  reports that she has never smoked. She has never used smokeless tobacco. She reports that she drinks alcohol. She reports that she does not use illicit drugs.   Family History:  The patient's family history includes CAD in her brother; Emphysema in her father; Heart attack in her mother; Heart disease in her brother and sister; Hypertension in her daughter; Lung cancer in her brother. There is no history of Colon cancer.   ROS:  Please see the history of present illness.      All other systems reviewed and negative.   PHYSICAL EXAM: VS:  BP 142/88  Pulse 108  Ht 5\' 5"  (1.651 m)  Wt 194 lb (87.998 kg)  BMI 32.28 kg/m2  SpO2 99% Well nourished, well developed, in no acute distress HEENT: normal Neck: no JVD Cardiac:  normal S1, S2; RRR; no murmur Lungs:  clear to auscultation bilaterally, no wheezing, rhonchi or rales Abd: soft, nontender, no hepatomegaly Ext: no edema Skin: warm and dry Neuro:  CNs 2-12 intact, no focal abnormalities noted  EKG:     NSR with no ST changes  ASSESSMENT AND PLAN:  1. DOE which may be multifactorial due to sedentary state, chronic pain and obesity but need to rule out coronary ischemia.  She will need an ischemic workup before proceeding with her TKR.  Her mom had an MI in her 82's.  She is a nonsmoker but does have a history of HTN. - 2D echo to assess LVF - Lexiscan myoview to assess for ischemia - I have talked  with Dr. Maureen Ralphs and recommended holding on her knee surgery tomorrow until we can get her  cardiac studies done 2. HTN - well controlled - continue HCTZ 3. Chronic orthopedic pain 4. Sharp atypical CP most likely related to severe GERD  Followup PRN based on results of studies  Signed, Fransico Him, MD 06/23/2013 4:11 PM

## 2013-06-24 ENCOUNTER — Encounter (HOSPITAL_COMMUNITY): Admission: RE | Payer: Self-pay | Source: Ambulatory Visit

## 2013-06-24 ENCOUNTER — Inpatient Hospital Stay (HOSPITAL_COMMUNITY): Admission: RE | Admit: 2013-06-24 | Payer: 59 | Source: Ambulatory Visit | Admitting: Orthopedic Surgery

## 2013-06-24 SURGERY — ARTHROPLASTY, KNEE, TOTAL
Anesthesia: Choice | Site: Knee | Laterality: Right

## 2013-06-28 ENCOUNTER — Encounter (HOSPITAL_COMMUNITY): Payer: 59

## 2013-06-28 ENCOUNTER — Ambulatory Visit (HOSPITAL_COMMUNITY): Payer: 59 | Attending: Cardiology | Admitting: Radiology

## 2013-06-28 DIAGNOSIS — R079 Chest pain, unspecified: Secondary | ICD-10-CM

## 2013-06-28 DIAGNOSIS — R0609 Other forms of dyspnea: Secondary | ICD-10-CM | POA: Insufficient documentation

## 2013-06-28 DIAGNOSIS — R0989 Other specified symptoms and signs involving the circulatory and respiratory systems: Principal | ICD-10-CM | POA: Insufficient documentation

## 2013-06-28 DIAGNOSIS — R0602 Shortness of breath: Secondary | ICD-10-CM

## 2013-06-28 NOTE — Progress Notes (Signed)
Echocardiogram performed.  

## 2013-06-29 ENCOUNTER — Ambulatory Visit (HOSPITAL_COMMUNITY): Payer: 59 | Attending: Cardiology | Admitting: Radiology

## 2013-06-29 VITALS — BP 193/115 | HR 85 | Ht 65.0 in | Wt 194.0 lb

## 2013-06-29 DIAGNOSIS — R0609 Other forms of dyspnea: Secondary | ICD-10-CM | POA: Insufficient documentation

## 2013-06-29 DIAGNOSIS — R5381 Other malaise: Secondary | ICD-10-CM | POA: Insufficient documentation

## 2013-06-29 DIAGNOSIS — R0989 Other specified symptoms and signs involving the circulatory and respiratory systems: Secondary | ICD-10-CM | POA: Insufficient documentation

## 2013-06-29 DIAGNOSIS — Z8249 Family history of ischemic heart disease and other diseases of the circulatory system: Secondary | ICD-10-CM | POA: Insufficient documentation

## 2013-06-29 DIAGNOSIS — R5383 Other fatigue: Secondary | ICD-10-CM

## 2013-06-29 DIAGNOSIS — R0789 Other chest pain: Secondary | ICD-10-CM | POA: Insufficient documentation

## 2013-06-29 DIAGNOSIS — R0602 Shortness of breath: Secondary | ICD-10-CM

## 2013-06-29 DIAGNOSIS — R079 Chest pain, unspecified: Secondary | ICD-10-CM

## 2013-06-29 MED ORDER — TECHNETIUM TC 99M SESTAMIBI GENERIC - CARDIOLITE
10.8000 | Freq: Once | INTRAVENOUS | Status: AC | PRN
  Administered 2013-06-29: 11 via INTRAVENOUS

## 2013-06-29 MED ORDER — REGADENOSON 0.4 MG/5ML IV SOLN
0.4000 mg | Freq: Once | INTRAVENOUS | Status: AC
  Administered 2013-06-29: 0.4 mg via INTRAVENOUS

## 2013-06-29 MED ORDER — DEXTROSE 5 % IV SOLN
150.0000 mg | Freq: Once | INTRAVENOUS | Status: AC
  Administered 2013-06-29: 150 mg via INTRAVENOUS

## 2013-06-29 MED ORDER — TECHNETIUM TC 99M SESTAMIBI GENERIC - CARDIOLITE
33.0000 | Freq: Once | INTRAVENOUS | Status: AC | PRN
  Administered 2013-06-29: 33 via INTRAVENOUS

## 2013-06-29 NOTE — Progress Notes (Signed)
Angie Olson [address] [place], [place] [address] [phone]    Cardiology Nuclear Olson Study  Angie Olson is a 63 y.o. female     MRN : [id number]     DOB: 31-Mar-1950  Procedure Date: 06/29/2013  Nuclear Olson Background Indication for Stress Test:  Evaluation for Ischemia and Surgical Clearance Knee Surgery- Dr. Hector Shade History:  No known CAD, COPD Cardiac Risk Factors: Carotid Disease, Family History - CAD, Hypertension and TIA  Symptoms:  Chest Pain (last date of chest discomfort was yesterday), DOE and Fatigue   Nuclear Pre-Procedure Caffeine/Decaff Intake:  None NPO After: 2:00am   Lungs:  clear O2 Sat: 96% on room air. IV 0.9% NS with Angio Cath:  22g  IV Site: R Antecubital  IV Started by:  Crissie Figures, RN  Chest Size (in):  40 Cup Size: D  Height: 5\' 5"  (1.651 m)  Weight:  194 lb (87.998 kg)  BMI:  Body mass index is 32.28 kg/(m^2). Tech Comments:  N/A    Nuclear Olson Study 1 or 2 day study: 1 day  Stress Test Type:  Lexiscan  Reading MD: N/A  Order Authorizing Provider:  Fransico Him, MD  Resting Radionuclide: Technetium 31m Sestamibi  Resting Radionuclide Dose: 11.0 mCi   Stress Radionuclide:  Technetium 69m Sestamibi  Stress Radionuclide Dose: 33.0 mCi           Stress Protocol Rest HR: 85 Stress HR: 115  Rest BP: 193/115 Stress BP: 158/84  Exercise Time (min): n/a METS: n/a           Dose of Adenosine (mg):  n/a Dose of Lexiscan: 0.4 mg  Dose of Atropine (mg): n/a Dose of Dobutamine: n/a mcg/kg/min (at max HR)  Stress Test Technologist: Glade Lloyd, BS-ES  Nuclear Technologist:  Charlton Amor, CNMT     Rest Procedure:  Myocardial perfusion imaging was performed at rest 45 minutes following the intravenous administration of Technetium 63m Sestamibi. Rest ECG: NSR with non-specific ST-T wave changes  Stress Procedure:  The patient received IV Lexiscan 0.4 mg over 15-seconds.  Technetium 64m Sestamibi  injected at 30-seconds.  Quantitative spect images were obtained after a 45 minute delay.  During the infusion of Lexiscan, the patient complained of feeling flushed, stomach pain, nausea and headache.  These symptoms would not resolve.  Administered 75 mg aminophylline and patient began to feel better within a couple of minutes.  However, while patient was waiting to have her stress images she developed chest discomfort and the headache remained.  An additional 75 mg aminophylline was give and symptoms slowly started to subside.  Stress ECG: No significant change from baseline ECG  QPS Raw Data Images:  Normal; no motion artifact; normal heart/lung ratio. Stress Images:  Normal homogeneous uptake in all areas of the myocardium. Rest Images:  Normal homogeneous uptake in all areas of the myocardium. Subtraction (SDS):  No evidence of ischemia. Transient Ischemic Dilatation (Normal <1.22):  0.95 Lung/Heart Ratio (Normal <0.45):  0.28  Quantitative Gated Spect Images QGS EDV:  83 ml QGS ESV:  22 ml  Impression Exercise Capacity:  Lexiscan with no exercise. BP Response:  Normal blood pressure response. Clinical Symptoms:  Mild chest pain/dyspnea. ECG Impression:  No significant ST segment change suggestive of ischemia. Comparison with Prior Nuclear Study: No images to compare  Overall Impression:  Normal stress nuclear study.  LV Ejection Fraction: 66%.  LV Wall Motion:  NL LV Function; NL Wall  Motion  Darlin Coco MD

## 2013-07-01 ENCOUNTER — Telehealth: Payer: Self-pay | Admitting: Cardiology

## 2013-07-01 NOTE — Telephone Encounter (Signed)
Received request from Nurse fax box, documents faxed for surgical clearance. To: Danise Mina Fax number: [phone] Attention: [phone]/kdm

## 2013-07-05 NOTE — Progress Notes (Signed)
Surgery on 07/25/13.  Need orders in EPIC.  Part of orders are still there from 05/3013 preop visit but rest of orders gone.  Thank You.

## 2013-07-07 ENCOUNTER — Encounter (HOSPITAL_COMMUNITY): Payer: Self-pay | Admitting: Pharmacy Technician

## 2013-07-14 ENCOUNTER — Other Ambulatory Visit: Payer: Self-pay | Admitting: Orthopedic Surgery

## 2013-07-19 ENCOUNTER — Encounter (HOSPITAL_COMMUNITY): Payer: Self-pay

## 2013-07-19 ENCOUNTER — Encounter (HOSPITAL_COMMUNITY)
Admission: RE | Admit: 2013-07-19 | Discharge: 2013-07-19 | Disposition: A | Discharge status: Home / Self Care | Payer: 59 | Source: Ambulatory Visit | Attending: Orthopedic Surgery | Admitting: Orthopedic Surgery

## 2013-07-19 DIAGNOSIS — Z01812 Encounter for preprocedural laboratory examination: Secondary | ICD-10-CM | POA: Insufficient documentation

## 2013-07-19 LAB — COMPREHENSIVE METABOLIC PANEL
ALK PHOS: 52 U/L (ref 39–117)
ALT: 31 U/L (ref 0–35)
AST: 25 U/L (ref 0–37)
Albumin: 3.7 g/dL (ref 3.5–5.2)
BILIRUBIN TOTAL: 0.4 mg/dL (ref 0.3–1.2)
BUN: 15 mg/dL (ref 6–23)
CHLORIDE: 101 meq/L (ref 96–112)
CO2: 30 meq/L (ref 19–32)
Calcium: 9.9 mg/dL (ref 8.4–10.5)
Creatinine, Ser: 0.76 mg/dL (ref 0.50–1.10)
GFR calc non Af Amer: 89 mL/min — ABNORMAL LOW (ref >90)
GLUCOSE: 125 mg/dL — AB (ref 70–99)
POTASSIUM: 3.4 meq/L — AB (ref 3.7–5.3)
Sodium: 143 mEq/L (ref 137–147)
TOTAL PROTEIN: 6.5 g/dL (ref 6.0–8.3)

## 2013-07-19 LAB — URINALYSIS, ROUTINE W REFLEX MICROSCOPIC
BILIRUBIN URINE: NEGATIVE
GLUCOSE, UA: NEGATIVE mg/dL
HGB URINE DIPSTICK: NEGATIVE
Ketones, ur: NEGATIVE mg/dL
Leukocytes, UA: NEGATIVE
Nitrite: NEGATIVE
PROTEIN: NEGATIVE mg/dL
Specific Gravity, Urine: 1.027 (ref 1.005–1.030)
UROBILINOGEN UA: 0.2 mg/dL (ref 0.0–1.0)
pH: 6 (ref 5.0–8.0)

## 2013-07-19 LAB — CBC
HEMATOCRIT: 40 % (ref 36.0–46.0)
Hemoglobin: 13.2 g/dL (ref 12.0–15.0)
MCH: 27.4 pg (ref 26.0–34.0)
MCHC: 33 g/dL (ref 30.0–36.0)
MCV: 83.2 fL (ref 78.0–100.0)
Platelets: 274 10*3/uL (ref 150–400)
RBC: 4.81 MIL/uL (ref 3.87–5.11)
RDW: 13.4 % (ref 11.5–15.5)
WBC: 6.3 10*3/uL (ref 4.0–10.5)

## 2013-07-19 LAB — SURGICAL PCR SCREEN
MRSA, PCR: NEGATIVE
Staphylococcus aureus: NEGATIVE

## 2013-07-19 LAB — PROTIME-INR
INR: 0.96 (ref 0.00–1.49)
Prothrombin Time: 12.6 seconds (ref 11.6–15.2)

## 2013-07-19 LAB — APTT: APTT: 26 s (ref 24–37)

## 2013-07-19 NOTE — Patient Instructions (Signed)
[place]  07/19/2013   Your procedure is scheduled on:   07-25-2013  Report to [facility] at       [place] AM .  Call this number if you have problems the morning of surgery: [phone]  Or Presurgical Testing [phone](Dheeraj Hail) For Living Will and/or Health Care Power Attorney Forms: please provide copy for your medical record,may bring AM of surgery(Forms should be already notarized -we do not provide this service).(07-19-13 no further information desired today)     Do not eat food:After Midnight.    Take these medicines the morning of surgery with A SIP OF WATER: Lipitor. Nexium. [place]. Prednisone. Use/bring inhalers.   Do not wear jewelry, make-up or nail polish.  Do not wear lotions, powders, or perfumes. You may wear deodorant.  Do not shave 48 hours(2 days) prior to first CHG shower(legs and under arms).(Shaving face and neck okay.)  Do not bring valuables to the hospital.(Hospital is not responsible for lost valuables).  Contacts, dentures or removable bridgework, body piercing, hair pins may not be worn into surgery.  Leave suitcase in the car. After surgery it may be brought to your room.  For patients admitted to the hospital, checkout time is 11:00 AM the day of discharge.(Restricted visitors-Any Persons displaying flu-like symptoms or illness).    Patients discharged the day of surgery will not be allowed to drive home. Must have responsible person with you x 24 hours once discharged.  Name and phone number of your driver: Jim-spouse [phone] cell  Special Instructions: CHG(Chlorhedine 4%-"Hibiclens","Betasept","Aplicare") Shower Use Special Wash: see special instructions.(avoid face and genitals)   Please read over the following fact sheets that you were given: MRSA Information, Blood Transfusion fact sheet, Incentive Spirometry Instruction.  Remember : Type/Screen "Blue armbands" - may not be removed once applied(would result in being retested  AM of surgery, if removed).  Failure to follow these instructions may result in Cancellation of your surgery.   Patient signature_______________________________________________________

## 2013-07-19 NOTE — Pre-Procedure Instructions (Addendum)
07-19-13 EKG,Stress, Echo 4'15 -Epic.CT Chest 8'14- Epic. 07-19-13 Clearance note from Dr. Darene Lamer. Turner,cardiology-06-23-13 with chart.

## 2013-07-24 ENCOUNTER — Other Ambulatory Visit: Payer: Self-pay | Admitting: Orthopedic Surgery

## 2013-07-24 NOTE — H&P (Signed)
TOTAL KNEE ADMISSION H&P  Patient is being admitted for right total knee arthroplasty.  Subjective:  Chief Complaint:right knee pain.  HPI: Angie Olson, 63 y.o. female, has a history of pain and functional disability in the right knee due to arthritis and has failed non-surgical conservative treatments for greater than 12 weeks to includecorticosteriod injections.  Onset of symptoms was gradual,  with gradually worsening course since that time. The patient noted prior procedures on the knee to include  arthroscopy on the right knee(s).  Patient currently rates pain in the right knee(s) as moderate with activity. Patient has worsening of pain with activity and weight bearing and pain that interferes with activities of daily living.  Patient has evidence of joint space narrowing by imaging studies. There is no active infection.  Patient Active Problem List   Diagnosis Date Noted  . DOE (dyspnea on exertion) 06/23/2013  . HTN (hypertension) 06/23/2013  . Acute medial meniscal tear 10/13/2012  . SINUSITIS 12/24/2007  . PULMONARY NODULE 12/24/2007  . COUGH 12/24/2007  . COLONIC POLYPS, ADENOMATOUS, HX OF 10/19/2007  . ALLERGIC RHINITIS 12/07/2006  . GERD 12/07/2006   Past Medical History  Diagnosis Date  . Hypertension   . Acid reflux   . Bronchitis     CHRONIC   . Anxiety     PT'S SON DIED 07-16-10  . Pneumonia     X 3  - LAST TIME WAS 2011-07-16  . H/O hiatal hernia   . Arthritis     RA AND OA  . Pain     RIGHT KNEE - TORN MENISCUS AND ACL  . Stroke 03/2011    TIA--PT EXPERIENCED NUMBNESS RT HAND AND ARM , HEADACHE AND NAUSEA. NO RESIDUAL PROBLEMS  . Borderline hypercholesterolemia     on medication for TIA  . Environmental allergies   . Shortness of breath     with activity  . PONV (postoperative nausea and vomiting) 09-2012    sore throat after ankle surgery 6 yrs ago and severe ponv 09-2012  . Allergic rhinitis   . GERD (gastroesophageal reflux disease)     on PPI and carafte  prn  . IBS (irritable bowel syndrome)   . LBP (low back pain)   . Pulmonary nodule seen on imaging study   . OA (osteoarthritis)     Left Shoulder  . Colon polyp     Past Surgical History  Procedure Laterality Date  . Ankle surgery  ~10 years ago    left  . Shoulder surgery Left ~14 years ago    "scope"  . Thumb surgery Left ~14 years ago    x2  . Colonoscopy  0109,3235, 16-Jul-2011  . Trigger finger release  ~6 years ago    X2 ON RIGHT HAND  . Knee arthroscopy Right 10/13/2012    Procedure: RIGHT KNEE ARTHROSCOPY WITH DEBRIDEMENT, CHONDROPLASTY;  Surgeon: Gearlean Alf, MD;  Location: [facility];  Service: Orthopedics;  Laterality: Right;  . Back surgery  07-15-09    lower  . Knee arthroscopy Right 03/02/2013    Procedure: RIGHT ARTHROSCOPY KNEE WITH MEDIAL MENISCAL  DEBRIDEMENT;  Surgeon: Gearlean Alf, MD;  Location: [facility];  Service: Orthopedics;  Laterality: Right;  . Cholecystectomy  ~15 years ago  . Root canal    . Tubal ligation       (Not in a hospital admission) Allergies  Allergen Reactions  . Methotrexate Derivatives     Recurrent infection  . Pseudoephedrine  Makes jittery   . Vicodin [Hydrocodone-Acetaminophen]     "Makes me feel weird"    History  Substance Use Topics  . Smoking status: Never Smoker   . Smokeless tobacco: Never Used  . Alcohol Use: Yes     Comment: RARE     Family History  Problem Relation Age of Onset  . Colon cancer Neg Hx   . Heart disease Sister   . Lung cancer Brother   . Heart disease Brother   . CAD Brother   . Heart attack Mother   . Emphysema Father   . Hypertension Daughter      Review of Systems  Constitutional: Negative.   Respiratory: Negative.   Cardiovascular: Negative.   Genitourinary: Negative.   Musculoskeletal: Positive for joint pain.  Skin: Negative.   Neurological: Negative.     Objective:  Physical Exam  Constitutional: She appears well-developed and well-nourished. No distress.  HENT:  Head:  Normocephalic and atraumatic.  Eyes: EOM are normal. Pupils are equal, round, and reactive to light.  Neck: Neck supple.  Cardiovascular: Normal rate and regular rhythm.   No murmur heard. Respiratory: Breath sounds normal. No respiratory distress.  GI: Soft. Bowel sounds are normal.  Musculoskeletal:       Right knee: She exhibits no effusion, no LCL laxity and no MCL laxity. Tenderness found. Medial joint line tenderness noted.       Legs:   Vital signs in last 24 hours: BP 158/88 Resp 16  Labs:   Estimated body mass index is 32.64 kg/(m^2) as calculated from the following:   Height as of 07/19/13: 5\' 5"  (1.651 m).   Weight as of 07/19/13: 88.962 kg (196 lb 2 oz).   Imaging Review Plain radiographs demonstrate severe degenerative joint disease of the right knee(s).  The bone quality appears to be good for age and reported activity level.  Assessment/Plan:  End stage arthritis, right knee   The patient history, physical examination, clinical judgment of the provider and imaging studies are consistent with end stage degenerative joint disease of the right knee(s) and total knee arthroplasty is deemed medically necessary. The treatment options including medical management, injection therapy arthroscopy and arthroplasty were discussed at length. The risks and benefits of total knee arthroplasty were presented and reviewed. The risks due to aseptic loosening, infection, stiffness, patella tracking problems, thromboembolic complications and other imponderables were discussed. The patient acknowledged the explanation, agreed to proceed with the plan and consent was signed. Patient is being admitted for inpatient treatment for surgery, pain control, PT, OT, prophylactic antibiotics, VTE prophylaxis, progressive ambulation and ADL's and discharge planning. The patient is planning to be discharged home with home health services  NO TXA due to history of CVA.  [name], PA-C

## 2013-07-25 ENCOUNTER — Inpatient Hospital Stay (HOSPITAL_COMMUNITY): Payer: 59 | Admitting: Certified Registered Nurse Anesthetist

## 2013-07-25 ENCOUNTER — Encounter (HOSPITAL_COMMUNITY): Payer: 59 | Admitting: Certified Registered Nurse Anesthetist

## 2013-07-25 ENCOUNTER — Encounter (HOSPITAL_COMMUNITY)
Admission: RE | Disposition: A | Discharge status: Home Health | Payer: Self-pay | Source: Ambulatory Visit | Attending: Orthopedic Surgery

## 2013-07-25 ENCOUNTER — Inpatient Hospital Stay (HOSPITAL_COMMUNITY)
Admission: RE | Admit: 2013-07-25 | Discharge: 2013-07-27 | DRG: 470 | Disposition: A | Discharge status: Home Health | Payer: 59 | Source: Ambulatory Visit | Attending: Orthopedic Surgery | Admitting: Orthopedic Surgery

## 2013-07-25 ENCOUNTER — Encounter (HOSPITAL_COMMUNITY): Payer: Self-pay | Admitting: *Deleted

## 2013-07-25 DIAGNOSIS — Z888 Allergy status to other drugs, medicaments and biological substances status: Secondary | ICD-10-CM

## 2013-07-25 DIAGNOSIS — Z96651 Presence of right artificial knee joint: Secondary | ICD-10-CM

## 2013-07-25 DIAGNOSIS — Z8673 Personal history of transient ischemic attack (TIA), and cerebral infarction without residual deficits: Secondary | ICD-10-CM

## 2013-07-25 DIAGNOSIS — K219 Gastro-esophageal reflux disease without esophagitis: Secondary | ICD-10-CM | POA: Diagnosis present

## 2013-07-25 DIAGNOSIS — E7800 Pure hypercholesterolemia, unspecified: Secondary | ICD-10-CM | POA: Diagnosis present

## 2013-07-25 DIAGNOSIS — F411 Generalized anxiety disorder: Secondary | ICD-10-CM | POA: Diagnosis present

## 2013-07-25 DIAGNOSIS — Z860100 Personal history of colon polyps, unspecified: Secondary | ICD-10-CM

## 2013-07-25 DIAGNOSIS — I10 Essential (primary) hypertension: Secondary | ICD-10-CM | POA: Diagnosis present

## 2013-07-25 DIAGNOSIS — M1710 Unilateral primary osteoarthritis, unspecified knee: Principal | ICD-10-CM | POA: Diagnosis present

## 2013-07-25 DIAGNOSIS — Z8249 Family history of ischemic heart disease and other diseases of the circulatory system: Secondary | ICD-10-CM

## 2013-07-25 DIAGNOSIS — M179 Osteoarthritis of knee, unspecified: Secondary | ICD-10-CM | POA: Diagnosis present

## 2013-07-25 DIAGNOSIS — Z801 Family history of malignant neoplasm of trachea, bronchus and lung: Secondary | ICD-10-CM

## 2013-07-25 DIAGNOSIS — J42 Unspecified chronic bronchitis: Secondary | ICD-10-CM | POA: Diagnosis present

## 2013-07-25 HISTORY — PX: TOTAL KNEE ARTHROPLASTY: SHX125

## 2013-07-25 LAB — TYPE AND SCREEN
ABO/RH(D): A NEG
Antibody Screen: NEGATIVE

## 2013-07-25 LAB — ABO/RH: ABO/RH(D): A NEG

## 2013-07-25 SURGERY — ARTHROPLASTY, KNEE, TOTAL
Anesthesia: Spinal | Site: Knee | Laterality: Right

## 2013-07-25 MED ORDER — SODIUM CHLORIDE 0.9 % IR SOLN
Status: DC | PRN
  Administered 2013-07-25: 1000 mL

## 2013-07-25 MED ORDER — HYDROMORPHONE HCL PF 1 MG/ML IJ SOLN
0.5000 mg | INTRAMUSCULAR | Status: DC | PRN
  Administered 2013-07-25 – 2013-07-26 (×9): 1 mg via INTRAVENOUS
  Filled 2013-07-25 (×9): qty 1

## 2013-07-25 MED ORDER — PREDNISONE 5 MG PO TABS
5.0000 mg | ORAL_TABLET | Freq: Two times a day (BID) | ORAL | Status: DC
  Filled 2013-07-25: qty 1

## 2013-07-25 MED ORDER — BUPIVACAINE HCL 0.25 % IJ SOLN
INTRAMUSCULAR | Status: DC | PRN
  Administered 2013-07-25: 20 mL

## 2013-07-25 MED ORDER — RIVAROXABAN 10 MG PO TABS
10.0000 mg | ORAL_TABLET | Freq: Every day | ORAL | Status: DC
  Administered 2013-07-26 – 2013-07-27 (×2): 10 mg via ORAL
  Filled 2013-07-25 (×3): qty 1

## 2013-07-25 MED ORDER — ACETAMINOPHEN 650 MG RE SUPP: 650.0000 mg | Freq: Four times a day (QID) | RECTAL | Status: DC | PRN

## 2013-07-25 MED ORDER — STERILE WATER FOR IRRIGATION IR SOLN
Status: DC | PRN
  Administered 2013-07-25: 1500 mL

## 2013-07-25 MED ORDER — PROMETHAZINE HCL 25 MG/ML IJ SOLN: 6.2500 mg | INTRAMUSCULAR | Status: DC | PRN

## 2013-07-25 MED ORDER — CEFAZOLIN SODIUM-DEXTROSE 2-3 GM-% IV SOLR
2.0000 g | Freq: Four times a day (QID) | INTRAVENOUS | Status: AC
  Administered 2013-07-25 (×2): 2 g via INTRAVENOUS
  Filled 2013-07-25 (×2): qty 50

## 2013-07-25 MED ORDER — BUPIVACAINE LIPOSOME 1.3 % IJ SUSP
INTRAMUSCULAR | Status: DC | PRN
  Administered 2013-07-25: 20 mL

## 2013-07-25 MED ORDER — ATROPINE SULFATE 0.4 MG/ML IJ SOLN
INTRAMUSCULAR | Status: AC
  Filled 2013-07-25: qty 1

## 2013-07-25 MED ORDER — ALUM & MAG HYDROXIDE-SIMETH 200-200-20 MG/5ML PO SUSP
30.0000 mL | Freq: Four times a day (QID) | ORAL | Status: DC | PRN
  Administered 2013-07-26: 30 mL via ORAL
  Filled 2013-07-25: qty 30

## 2013-07-25 MED ORDER — METOCLOPRAMIDE HCL 5 MG PO TABS
5.0000 mg | ORAL_TABLET | Freq: Three times a day (TID) | ORAL | Status: DC | PRN
  Filled 2013-07-25: qty 2

## 2013-07-25 MED ORDER — CHLORHEXIDINE GLUCONATE 4 % EX LIQD: 60.0000 mL | Freq: Once | CUTANEOUS | Status: DC

## 2013-07-25 MED ORDER — PREDNISONE 10 MG PO TABS
10.0000 mg | ORAL_TABLET | Freq: Two times a day (BID) | ORAL | Status: DC
  Administered 2013-07-27: 10 mg via ORAL
  Filled 2013-07-25 (×2): qty 1

## 2013-07-25 MED ORDER — ONDANSETRON HCL 4 MG/2ML IJ SOLN
INTRAMUSCULAR | Status: DC | PRN
  Administered 2013-07-25 (×2): 2 mg via INTRAVENOUS

## 2013-07-25 MED ORDER — FENTANYL CITRATE 0.05 MG/ML IJ SOLN
INTRAMUSCULAR | Status: AC
  Filled 2013-07-25: qty 2

## 2013-07-25 MED ORDER — POLYETHYLENE GLYCOL 3350 17 G PO PACK: 17.0000 g | PACK | Freq: Every day | ORAL | Status: DC | PRN

## 2013-07-25 MED ORDER — ACETAMINOPHEN 10 MG/ML IV SOLN
INTRAVENOUS | Status: DC | PRN
  Administered 2013-07-25: 1000 mg via INTRAVENOUS

## 2013-07-25 MED ORDER — ATORVASTATIN CALCIUM 20 MG PO TABS
20.0000 mg | ORAL_TABLET | Freq: Every morning | ORAL | Status: DC
  Administered 2013-07-26 – 2013-07-27 (×2): 20 mg via ORAL
  Filled 2013-07-25 (×2): qty 1

## 2013-07-25 MED ORDER — SODIUM CHLORIDE 0.9 % IJ SOLN
INTRAMUSCULAR | Status: AC
  Filled 2013-07-25: qty 50

## 2013-07-25 MED ORDER — BUPIVACAINE IN DEXTROSE 0.75-8.25 % IT SOLN
INTRATHECAL | Status: DC | PRN
  Administered 2013-07-25: 2 mL via INTRATHECAL

## 2013-07-25 MED ORDER — SODIUM CHLORIDE 0.9 % IJ SOLN
INTRAMUSCULAR | Status: DC | PRN
  Administered 2013-07-25: 30 mL

## 2013-07-25 MED ORDER — DEXAMETHASONE SODIUM PHOSPHATE 4 MG/ML IJ SOLN
INTRAMUSCULAR | Status: DC | PRN
  Administered 2013-07-25: 10 mg via INTRAVENOUS

## 2013-07-25 MED ORDER — OXYCODONE HCL 5 MG PO TABS
5.0000 mg | ORAL_TABLET | ORAL | Status: DC | PRN
  Administered 2013-07-25 (×3): 10 mg via ORAL
  Administered 2013-07-25: 5 mg via ORAL
  Administered 2013-07-26 – 2013-07-27 (×10): 10 mg via ORAL
  Filled 2013-07-25 (×2): qty 2
  Filled 2013-07-25: qty 1
  Filled 2013-07-25 (×11): qty 2

## 2013-07-25 MED ORDER — BISACODYL 10 MG RE SUPP
10.0000 mg | Freq: Every day | RECTAL | Status: DC | PRN
Start: 2013-07-25 — End: 2013-07-27

## 2013-07-25 MED ORDER — BUPIVACAINE LIPOSOME 1.3 % IJ SUSP
20.0000 mL | Freq: Once | INTRAMUSCULAR | Status: DC
  Filled 2013-07-25: qty 20

## 2013-07-25 MED ORDER — ALPRAZOLAM 0.5 MG PO TABS
0.5000 mg | ORAL_TABLET | Freq: Every day | ORAL | Status: DC
  Administered 2013-07-25 – 2013-07-26 (×2): 0.5 mg via ORAL
  Filled 2013-07-25 (×2): qty 1

## 2013-07-25 MED ORDER — PREDNISONE 20 MG PO TABS
20.0000 mg | ORAL_TABLET | Freq: Every evening | ORAL | Status: AC
Start: 2013-07-25 — End: 2013-07-25
  Administered 2013-07-25: 20 mg via ORAL
  Filled 2013-07-25: qty 1

## 2013-07-25 MED ORDER — TRAMADOL HCL 50 MG PO TABS: 50.0000 mg | ORAL_TABLET | Freq: Four times a day (QID) | ORAL | Status: DC | PRN

## 2013-07-25 MED ORDER — MIDAZOLAM HCL 2 MG/2ML IJ SOLN
INTRAMUSCULAR | Status: AC
  Filled 2013-07-25: qty 2

## 2013-07-25 MED ORDER — PHENYLEPHRINE HCL 10 MG/ML IJ SOLN
INTRAMUSCULAR | Status: DC | PRN
  Administered 2013-07-25 (×2): 20 ug via INTRAVENOUS
  Administered 2013-07-25: 40 ug via INTRAVENOUS
  Administered 2013-07-25 (×3): 20 ug via INTRAVENOUS

## 2013-07-25 MED ORDER — PREDNISONE 5 MG PO TABS
15.0000 mg | ORAL_TABLET | Freq: Two times a day (BID) | ORAL | Status: AC
  Administered 2013-07-26 (×2): 15 mg via ORAL
  Filled 2013-07-25 (×2): qty 1

## 2013-07-25 MED ORDER — CEFAZOLIN SODIUM-DEXTROSE 2-3 GM-% IV SOLR
INTRAVENOUS | Status: AC
  Filled 2013-07-25: qty 50

## 2013-07-25 MED ORDER — ACETAMINOPHEN 500 MG PO TABS
1000.0000 mg | ORAL_TABLET | Freq: Four times a day (QID) | ORAL | Status: AC
  Administered 2013-07-25 – 2013-07-26 (×2): 1000 mg via ORAL
  Filled 2013-07-25 (×2): qty 2

## 2013-07-25 MED ORDER — PHENOL 1.4 % MT LIQD: 1.0000 | OROMUCOSAL | Status: DC | PRN

## 2013-07-25 MED ORDER — TRANEXAMIC ACID 100 MG/ML IV SOLN
1000.0000 mg | INTRAVENOUS | Status: DC
  Filled 2013-07-25: qty 10

## 2013-07-25 MED ORDER — SODIUM CHLORIDE 0.9 % IV SOLN: INTRAVENOUS | Status: DC

## 2013-07-25 MED ORDER — LACTATED RINGERS IV SOLN
INTRAVENOUS | Status: DC | PRN
  Administered 2013-07-25 (×2): via INTRAVENOUS

## 2013-07-25 MED ORDER — MORPHINE SULFATE 2 MG/ML IJ SOLN
1.0000 mg | INTRAMUSCULAR | Status: DC | PRN
Start: 2013-07-25 — End: 2013-07-25
  Administered 2013-07-25: 2 mg via INTRAVENOUS
  Filled 2013-07-25: qty 1

## 2013-07-25 MED ORDER — EPHEDRINE SULFATE 50 MG/ML IJ SOLN
INTRAMUSCULAR | Status: AC
  Filled 2013-07-25: qty 1

## 2013-07-25 MED ORDER — DIPHENHYDRAMINE HCL 12.5 MG/5ML PO ELIX: 12.5000 mg | ORAL_SOLUTION | ORAL | Status: DC | PRN

## 2013-07-25 MED ORDER — MEPERIDINE HCL 50 MG/ML IJ SOLN: 6.2500 mg | INTRAMUSCULAR | Status: DC | PRN

## 2013-07-25 MED ORDER — OLOPATADINE HCL 0.6 % NA SOLN: 1.0000 | Freq: Every day | NASAL | Status: DC

## 2013-07-25 MED ORDER — MONTELUKAST SODIUM 10 MG PO TABS
10.0000 mg | ORAL_TABLET | Freq: Every evening | ORAL | Status: DC
  Administered 2013-07-25 – 2013-07-26 (×2): 10 mg via ORAL
  Filled 2013-07-25 (×3): qty 1

## 2013-07-25 MED ORDER — HYDROCHLOROTHIAZIDE 25 MG PO TABS
25.0000 mg | ORAL_TABLET | Freq: Every morning | ORAL | Status: DC
  Administered 2013-07-25 – 2013-07-27 (×3): 25 mg via ORAL
  Filled 2013-07-25 (×3): qty 1

## 2013-07-25 MED ORDER — METHOCARBAMOL 1000 MG/10ML IJ SOLN
500.0000 mg | Freq: Four times a day (QID) | INTRAVENOUS | Status: DC | PRN
  Administered 2013-07-25: 500 mg via INTRAVENOUS
  Filled 2013-07-25: qty 5

## 2013-07-25 MED ORDER — METOCLOPRAMIDE HCL 5 MG/ML IJ SOLN
5.0000 mg | Freq: Three times a day (TID) | INTRAMUSCULAR | Status: DC | PRN
  Administered 2013-07-25: 10 mg via INTRAVENOUS
  Filled 2013-07-25: qty 2

## 2013-07-25 MED ORDER — PROPOFOL 10 MG/ML IV BOLUS
INTRAVENOUS | Status: AC
  Filled 2013-07-25: qty 20

## 2013-07-25 MED ORDER — PROPOFOL INFUSION 10 MG/ML OPTIME
INTRAVENOUS | Status: DC | PRN
  Administered 2013-07-25: 75 ug/kg/min via INTRAVENOUS

## 2013-07-25 MED ORDER — METHOCARBAMOL 500 MG PO TABS
500.0000 mg | ORAL_TABLET | Freq: Four times a day (QID) | ORAL | Status: DC | PRN
  Administered 2013-07-25 – 2013-07-27 (×5): 500 mg via ORAL
  Filled 2013-07-25 (×5): qty 1

## 2013-07-25 MED ORDER — ACETAMINOPHEN 10 MG/ML IV SOLN
1000.0000 mg | Freq: Once | INTRAVENOUS | Status: DC
  Filled 2013-07-25: qty 100

## 2013-07-25 MED ORDER — SUCRALFATE 1 G PO TABS
1.0000 g | ORAL_TABLET | Freq: Two times a day (BID) | ORAL | Status: DC | PRN
  Administered 2013-07-26: 1 g via ORAL
  Filled 2013-07-25 (×2): qty 1

## 2013-07-25 MED ORDER — ACETAMINOPHEN 325 MG PO TABS
650.0000 mg | ORAL_TABLET | Freq: Four times a day (QID) | ORAL | Status: DC | PRN
Start: 2013-07-25 — End: 2013-07-27
  Administered 2013-07-27: 650 mg via ORAL
  Filled 2013-07-25: qty 2

## 2013-07-25 MED ORDER — 0.9 % SODIUM CHLORIDE (POUR BTL) OPTIME
TOPICAL | Status: DC | PRN
  Administered 2013-07-25: 1000 mL

## 2013-07-25 MED ORDER — KETOROLAC TROMETHAMINE 15 MG/ML IJ SOLN
7.5000 mg | Freq: Four times a day (QID) | INTRAMUSCULAR | Status: AC | PRN
  Administered 2013-07-25 (×2): 7.5 mg via INTRAVENOUS
  Filled 2013-07-25 (×2): qty 1

## 2013-07-25 MED ORDER — KCL IN DEXTROSE-NACL 20-5-0.45 MEQ/L-%-% IV SOLN
INTRAVENOUS | Status: DC
  Administered 2013-07-25: 75 mL/h via INTRAVENOUS
  Administered 2013-07-26: 01:00:00 via INTRAVENOUS
  Filled 2013-07-25 (×6): qty 1000

## 2013-07-25 MED ORDER — BUPIVACAINE HCL (PF) 0.25 % IJ SOLN
INTRAMUSCULAR | Status: AC
  Filled 2013-07-25: qty 30

## 2013-07-25 MED ORDER — FLEET ENEMA 7-19 GM/118ML RE ENEM: 1.0000 | ENEMA | Freq: Once | RECTAL | Status: AC | PRN

## 2013-07-25 MED ORDER — FENTANYL CITRATE 0.05 MG/ML IJ SOLN
INTRAMUSCULAR | Status: DC | PRN
  Administered 2013-07-25 (×4): 50 ug via INTRAVENOUS

## 2013-07-25 MED ORDER — CEFAZOLIN SODIUM-DEXTROSE 2-3 GM-% IV SOLR
2.0000 g | INTRAVENOUS | Status: AC
  Administered 2013-07-25: 2 g via INTRAVENOUS

## 2013-07-25 MED ORDER — ONDANSETRON HCL 4 MG PO TABS: 4.0000 mg | ORAL_TABLET | Freq: Four times a day (QID) | ORAL | Status: DC | PRN

## 2013-07-25 MED ORDER — PROPOFOL 10 MG/ML IV BOLUS
INTRAVENOUS | Status: DC | PRN
  Administered 2013-07-25: 20 mg via INTRAVENOUS

## 2013-07-25 MED ORDER — PREDNISONE 5 MG PO TABS: 5.0000 mg | ORAL_TABLET | Freq: Every day | ORAL | Status: DC

## 2013-07-25 MED ORDER — MENTHOL 3 MG MT LOZG: 1.0000 | LOZENGE | OROMUCOSAL | Status: DC | PRN

## 2013-07-25 MED ORDER — DOCUSATE SODIUM 100 MG PO CAPS
100.0000 mg | ORAL_CAPSULE | Freq: Two times a day (BID) | ORAL | Status: DC
  Administered 2013-07-25 – 2013-07-27 (×5): 100 mg via ORAL

## 2013-07-25 MED ORDER — MIDAZOLAM HCL 5 MG/5ML IJ SOLN
INTRAMUSCULAR | Status: DC | PRN
  Administered 2013-07-25 (×2): 1 mg via INTRAVENOUS

## 2013-07-25 MED ORDER — SODIUM CHLORIDE 0.9 % IJ SOLN
INTRAMUSCULAR | Status: AC
  Filled 2013-07-25: qty 10

## 2013-07-25 MED ORDER — HYDROMORPHONE HCL PF 1 MG/ML IJ SOLN: 0.2500 mg | INTRAMUSCULAR | Status: DC | PRN

## 2013-07-25 MED ORDER — ONDANSETRON HCL 4 MG/2ML IJ SOLN
4.0000 mg | Freq: Four times a day (QID) | INTRAMUSCULAR | Status: DC | PRN
  Administered 2013-07-25 – 2013-07-26 (×3): 4 mg via INTRAVENOUS
  Filled 2013-07-25 (×3): qty 2

## 2013-07-25 MED ORDER — PANTOPRAZOLE SODIUM 40 MG PO TBEC
80.0000 mg | DELAYED_RELEASE_TABLET | Freq: Every day | ORAL | Status: DC
  Administered 2013-07-26: 80 mg via ORAL
  Filled 2013-07-25 (×2): qty 2

## 2013-07-25 SURGICAL SUPPLY — 59 items
BAG SPEC THK2 15X12 ZIP CLS (MISCELLANEOUS) ×1
BAG ZIPLOCK 12X15 (MISCELLANEOUS) ×3 IMPLANT
BANDAGE ELASTIC 6 VELCRO ST LF (GAUZE/BANDAGES/DRESSINGS) ×3 IMPLANT
BANDAGE ESMARK 6X9 LF (GAUZE/BANDAGES/DRESSINGS) ×1 IMPLANT
BLADE SAG 18X100X1.27 (BLADE) ×3 IMPLANT
BLADE SAW SGTL 11.0X1.19X90.0M (BLADE) ×3 IMPLANT
BNDG CMPR 9X6 STRL LF SNTH (GAUZE/BANDAGES/DRESSINGS) ×1
BNDG ESMARK 6X9 LF (GAUZE/BANDAGES/DRESSINGS) ×3
BOWL SMART MIX CTS (DISPOSABLE) ×3 IMPLANT
CAP KNEE ATTUNE RP ×2 IMPLANT
CEMENT HV SMART SET (Cement) ×6 IMPLANT
CLOSURE WOUND 1/2 X4 (GAUZE/BANDAGES/DRESSINGS) ×2
CUFF TOURN SGL QUICK 34 (TOURNIQUET CUFF) ×3
CUFF TRNQT CYL 34X4X40X1 (TOURNIQUET CUFF) ×1 IMPLANT
DECANTER SPIKE VIAL GLASS SM (MISCELLANEOUS) ×3 IMPLANT
DRAPE EXTREMITY T 121X128X90 (DRAPE) ×3 IMPLANT
DRAPE POUCH INSTRU U-SHP 10X18 (DRAPES) ×3 IMPLANT
DRAPE U-SHAPE 47X51 STRL (DRAPES) ×3 IMPLANT
DRSG ADAPTIC 3X8 NADH LF (GAUZE/BANDAGES/DRESSINGS) ×3 IMPLANT
DRSG PAD ABDOMINAL 8X10 ST (GAUZE/BANDAGES/DRESSINGS) ×3 IMPLANT
DURAPREP 26ML APPLICATOR (WOUND CARE) ×3 IMPLANT
ELECT REM PT RETURN 9FT ADLT (ELECTROSURGICAL) ×3
ELECTRODE REM PT RTRN 9FT ADLT (ELECTROSURGICAL) ×1 IMPLANT
EVACUATOR 1/8 PVC DRAIN (DRAIN) ×3 IMPLANT
FACESHIELD WRAPAROUND (MASK) ×15 IMPLANT
FACESHIELD WRAPAROUND OR TEAM (MASK) ×5 IMPLANT
GLOVE BIO SURGEON STRL SZ7.5 (GLOVE) IMPLANT
GLOVE BIO SURGEON STRL SZ8 (GLOVE) ×3 IMPLANT
GLOVE BIOGEL PI IND STRL 8 (GLOVE) ×2 IMPLANT
GLOVE BIOGEL PI INDICATOR 8 (GLOVE) ×4
GLOVE SURG SS PI 6.5 STRL IVOR (GLOVE) IMPLANT
GOWN STRL REUS W/TWL LRG LVL3 (GOWN DISPOSABLE) ×3 IMPLANT
GOWN STRL REUS W/TWL XL LVL3 (GOWN DISPOSABLE) IMPLANT
HANDPIECE INTERPULSE COAX TIP (DISPOSABLE) ×3
IMMOBILIZER KNEE 20 (SOFTGOODS) ×6 IMPLANT
IMMOBILIZER KNEE 20 THIGH 36 (SOFTGOODS) IMPLANT
KIT BASIN OR (CUSTOM PROCEDURE TRAY) ×3 IMPLANT
MANIFOLD NEPTUNE II (INSTRUMENTS) ×3 IMPLANT
NDL SAFETY ECLIPSE 18X1.5 (NEEDLE) ×2 IMPLANT
NEEDLE HYPO 18GX1.5 SHARP (NEEDLE) ×6
NS IRRIG 1000ML POUR BTL (IV SOLUTION) ×3 IMPLANT
PACK TOTAL JOINT (CUSTOM PROCEDURE TRAY) ×3 IMPLANT
PADDING CAST COTTON 6X4 STRL (CAST SUPPLIES) ×7 IMPLANT
POSITIONER SURGICAL ARM (MISCELLANEOUS) ×3 IMPLANT
SET HNDPC FAN SPRY TIP SCT (DISPOSABLE) ×1 IMPLANT
SPONGE GAUZE 4X4 12PLY (GAUZE/BANDAGES/DRESSINGS) ×3 IMPLANT
STRIP CLOSURE SKIN 1/2X4 (GAUZE/BANDAGES/DRESSINGS) ×4 IMPLANT
SUCTION FRAZIER 12FR DISP (SUCTIONS) ×3 IMPLANT
SUT MNCRL AB 4-0 PS2 18 (SUTURE) ×3 IMPLANT
SUT VIC AB 2-0 CT1 27 (SUTURE) ×9
SUT VIC AB 2-0 CT1 TAPERPNT 27 (SUTURE) ×3 IMPLANT
SUT VLOC 180 0 24IN GS25 (SUTURE) ×3 IMPLANT
SYR 20CC LL (SYRINGE) ×3 IMPLANT
SYR 50ML LL SCALE MARK (SYRINGE) ×3 IMPLANT
TOWEL OR 17X26 10 PK STRL BLUE (TOWEL DISPOSABLE) ×3 IMPLANT
TOWEL OR NON WOVEN STRL DISP B (DISPOSABLE) IMPLANT
TRAY FOLEY CATH 14FRSI W/METER (CATHETERS) ×3 IMPLANT
WATER STERILE IRR 1500ML POUR (IV SOLUTION) ×3 IMPLANT
WRAP KNEE MAXI GEL POST OP (GAUZE/BANDAGES/DRESSINGS) ×3 IMPLANT

## 2013-07-25 NOTE — Progress Notes (Signed)
PT Cancellation Note  Patient Details Name: Angie Olson MRN: [id number] DOB: Dec 22, 1950   Cancelled Treatment:    Reason Eval/Treat Not Completed: Pain limiting ability to participate on POD 0.  Will check back on tomorrow.    Weston Anna, MPT Pager: [phone]

## 2013-07-25 NOTE — Anesthesia Procedure Notes (Signed)
Spinal  Patient location during procedure: OR Start time: 07/25/2013 7:06 AM Staffing CRNA/Resident: Dion Saucier E Performed by: resident/CRNA  Preanesthetic Checklist Completed: patient identified, site marked, surgical consent, pre-op evaluation, timeout performed, IV checked, risks and benefits discussed and monitors and equipment checked Spinal Block Patient position: sitting Prep: Betadine Patient monitoring: heart rate, cardiac monitor, continuous pulse ox and blood pressure Approach: midline Location: L2-3 Injection technique: single-shot Needle Needle type: Spinocan  Needle gauge: 22 G Needle length: 9 cm Additional Notes Kit expiration date checked, negative paresthesia, heme.  clear csf, tolerated well

## 2013-07-25 NOTE — Anesthesia Postprocedure Evaluation (Signed)
Anesthesia Post Note  Patient: Angie Olson  Procedure(s) Performed: Procedure(s) (LRB): RIGHT TOTAL KNEE ARTHROPLASTY (Right)  Anesthesia type: Spinal  Patient location: PACU  Post pain: Pain level controlled  Post assessment: Post-op Vital signs reviewed  Last Vitals: BP 122/80  Pulse 102  Temp(Src) 36.7 C (Oral)  Resp 16  Ht 5\' 5"  (1.651 m)  Wt 196 lb (88.905 kg)  BMI 32.62 kg/m2  SpO2 98%  Post vital signs: Reviewed  Level of consciousness: sedated  Complications: No apparent anesthesia complications

## 2013-07-25 NOTE — Op Note (Signed)
Pre-operative diagnosis- Osteoarthritis  Right knee(s)  Post-operative diagnosis- Osteoarthritis Right knee(s)  Procedure-  Right  Total Knee Arthroplasty  Surgeon- Dione Plover. Marco Adelson, MD  Assistant- Ardeen Jourdain, PA-C   Anesthesia-  Spinal  EBL-* No blood loss amount entered *   Drains Hemovac  Tourniquet time-  Total Tourniquet Time Documented: Thigh (Right) - 33 minutes Total: Thigh (Right) - 33 minutes     Complications- None  Condition-PACU - hemodynamically stable.   Brief Clinical Note  Angie Olson is a 63 y.o. year old female with end stage OA of her right knee with progressively worsening pain and dysfunction. She has constant pain, with activity and at rest and significant functional deficits with difficulties even with ADLs. She has had extensive non-op management including analgesics, injections of cortisone, viscosupplements, arthroscopy and home exercise program, but remains in significant pain with significant dysfunction.Radiographs show bone on bone arthritis medial and patellofemoral. She presents now for right Total Knee Arthroplasty.    Procedure in detail---   The patient is brought into the operating room and positioned supine on the operating table. After successful administration of  Spinal,   a tourniquet is placed high on the  Right thigh(s) and the lower extremity is prepped and draped in the usual sterile fashion. Time out is performed by the operating team and then the  Right lower extremity is wrapped in Esmarch, knee flexed and the tourniquet inflated to 300 mmHg.       A midline incision is made with a ten blade through the subcutaneous tissue to the level of the extensor mechanism. A fresh blade is used to make a medial parapatellar arthrotomy. Soft tissue over the proximal medial tibia is subperiosteally elevated to the joint line with a knife and into the semimembranosus bursa with a Cobb elevator. Soft tissue over the proximal lateral tibia is  elevated with attention being paid to avoiding the patellar tendon on the tibial tubercle. The patella is everted, knee flexed 90 degrees and the ACL and PCL are removed. Findings are exposed bone medial and patellofemoral with medial osteophytes.        The drill is used to create a starting hole in the distal femur and the canal is thoroughly irrigated with sterile saline to remove the fatty contents. The 5 degree Right  valgus alignment guide is placed into the femoral canal and the distal femoral cutting block is pinned to remove 9 mm off the distal femur. Resection is made with an oscillating saw.      The tibia is subluxed forward and the menisci are removed. The extramedullary alignment guide is placed referencing proximally at the medial aspect of the tibial tubercle and distally along the second metatarsal axis and tibial crest. The block is pinned to remove 29mm off the more deficient medial  side. Resection is made with an oscillating saw. Size 4is the most appropriate size for the tibia and the proximal tibia is prepared with the modular drill and keel punch for that size.      The femoral sizing guide is placed and size 4 is most appropriate. Rotation is marked off the epicondylar axis and confirmed by creating a rectangular flexion gap at 90 degrees. The size 4 cutting block is pinned in this rotation and the anterior, posterior and chamfer cuts are made with the oscillating saw. The intercondylar block is then placed and that cut is made.      Trial size 4 tibial component, trial size 4  posterior stabilized femur and a 10  mm posterior stabilized rotating platform insert trial is placed. Full extension is achieved with excellent varus/valgus and anterior/posterior balance throughout full range of motion. The patella is everted and thickness measured to be 22  mm. Free hand resection is taken to 12 mm, a 35 template is placed, lug holes are drilled, trial patella is placed, and it tracks normally.  Osteophytes are removed off the posterior femur with the trial in place. All trials are removed and the cut bone surfaces prepared with pulsatile lavage. Cement is mixed and once ready for implantation, the size 4 tibial implant, size  4 narrow posterior stabilized femoral component, and the size 35 patella are cemented in place and the patella is held with the clamp. The trial insert is placed and the knee held in full extension. The Exparel (20 ml mixed with 30 ml saline) and .25% Bupivicaine, are injected into the extensor mechanism, posterior capsule, medial and lateral gutters and subcutaneous tissues.  All extruded cement is removed and once the cement is hard the permanent 10 mm posterior stabilized rotating platform insert is placed into the tibial tray.      The wound is copiously irrigated with saline solution and the extensor mechanism closed over a hemovac drain with #1 V-loc suture. The tourniquet is released for a total tourniquet time of 33  minutes. Flexion against gravity is 140 degrees and the patella tracks normally. Subcutaneous tissue is closed with 2.0 vicryl and subcuticular with running 4.0 Monocryl. The incision is cleaned and dried and steri-strips and a bulky sterile dressing are applied. The limb is placed into a knee immobilizer and the patient is awakened and transported to recovery in stable condition.      Please note that a surgical assistant was a medical necessity for this procedure in order to perform it in a safe and expeditious manner. Surgical assistant was necessary to retract the ligaments and vital neurovascular structures to prevent injury to them and also necessary for proper positioning of the limb to allow for anatomic placement of the prosthesis.   Dione Plover [name], MD    07/25/2013, 8:09 AM

## 2013-07-25 NOTE — Transfer of Care (Signed)
Immediate Anesthesia Transfer of Care Note  Patient: Angie Olson  Procedure(s) Performed: Procedure(s): RIGHT TOTAL KNEE ARTHROPLASTY (Right)  Patient Location: PACU  Anesthesia Type:Spinal  Level of Consciousness: awake, alert , oriented and patient cooperative  Airway & Oxygen Therapy: Patient Spontanous Breathing and Patient connected to face mask oxygen  Post-op Assessment: Report given to PACU RN and Post -op Vital signs reviewed and stable  Post vital signs: Reviewed and stable  Complications: No apparent anesthesia complications

## 2013-07-25 NOTE — Interval H&P Note (Signed)
History and Physical Interval Note:  07/25/2013 6:43 AM  Angie Olson  has presented today for surgery, with the diagnosis of OA OF RIGHT KNEE  The various methods of treatment have been discussed with the patient and family. After consideration of risks, benefits and other options for treatment, the patient has consented to  Procedure(s): RIGHT TOTAL KNEE ARTHROPLASTY (Right) as a surgical intervention .  The patient's history has been reviewed, patient examined, no change in status, stable for surgery.  I have reviewed the patient's chart and labs.  Questions were answered to the patient's satisfaction.     Dione Plover Imogene Gravelle

## 2013-07-25 NOTE — Anesthesia Preprocedure Evaluation (Signed)
Anesthesia Evaluation  Patient identified by MRN, date of birth, ID band Patient awake    Reviewed: Allergy & Precautions, H&P , NPO status , Patient's Chart, lab work & pertinent test results  History of Anesthesia Complications (+) PONV and history of anesthetic complications  Airway Mallampati: II TM Distance: >3 FB Neck ROM: Full    Dental no notable dental hx.    Pulmonary shortness of breath, pneumonia -, resolved,  breath sounds clear to auscultation  Pulmonary exam normal       Cardiovascular Exercise Tolerance: Good hypertension, Pt. on medications + DOE negative cardio ROS  Rhythm:Regular Rate:Normal     Neuro/Psych Anxiety CVA, No Residual Symptoms    GI/Hepatic Neg liver ROS, hiatal hernia, GERD-  Medicated,  Endo/Other  negative endocrine ROS  Renal/GU negative Renal ROS     Musculoskeletal negative musculoskeletal ROS (+)   Abdominal (+) + obese,   Peds  Hematology negative hematology ROS (+)   Anesthesia Other Findings   Reproductive/Obstetrics negative OB ROS                           Anesthesia Physical  Anesthesia Plan  ASA: III  Anesthesia Plan: Spinal   Post-op Pain Management:    Induction: Intravenous  Airway Management Planned:   Additional Equipment:   Intra-op Plan:   Post-operative Plan:   Informed Consent: I have reviewed the patients History and Physical, chart, labs and discussed the procedure including the risks, benefits and alternatives for the proposed anesthesia with the patient or authorized representative who has indicated his/her understanding and acceptance.   Dental advisory given  Plan Discussed with: CRNA  Anesthesia Plan Comments:         Anesthesia Quick Evaluation

## 2013-07-25 NOTE — H&P (View-Only) (Signed)
TOTAL KNEE ADMISSION H&P  Patient is being admitted for right total knee arthroplasty.  Subjective:  Chief Complaint:right knee pain.  HPI: Angie Olson, 63 y.o. female, has a history of pain and functional disability in the right knee due to arthritis and has failed non-surgical conservative treatments for greater than 12 weeks to includecorticosteriod injections.  Onset of symptoms was gradual,  with gradually worsening course since that time. The patient noted prior procedures on the knee to include  arthroscopy on the right knee(s).  Patient currently rates pain in the right knee(s) as moderate with activity. Patient has worsening of pain with activity and weight bearing and pain that interferes with activities of daily living.  Patient has evidence of joint space narrowing by imaging studies. There is no active infection.  Patient Active Problem List   Diagnosis Date Noted  . DOE (dyspnea on exertion) 06/23/2013  . HTN (hypertension) 06/23/2013  . Acute medial meniscal tear 10/13/2012  . SINUSITIS 12/24/2007  . PULMONARY NODULE 12/24/2007  . COUGH 12/24/2007  . COLONIC POLYPS, ADENOMATOUS, HX OF 10/19/2007  . ALLERGIC RHINITIS 12/07/2006  . GERD 12/07/2006   Past Medical History  Diagnosis Date  . Hypertension   . Acid reflux   . Bronchitis     CHRONIC   . Anxiety     PT'S SON DIED 07/19/10  . Pneumonia     X 3  - LAST TIME WAS 2011-07-19  . H/O hiatal hernia   . Arthritis     RA AND OA  . Pain     RIGHT KNEE - TORN MENISCUS AND ACL  . Stroke 03/2011    TIA--PT EXPERIENCED NUMBNESS RT HAND AND ARM , HEADACHE AND NAUSEA. NO RESIDUAL PROBLEMS  . Borderline hypercholesterolemia     on medication for TIA  . Environmental allergies   . Shortness of breath     with activity  . PONV (postoperative nausea and vomiting) 09-2012    sore throat after ankle surgery 6 yrs ago and severe ponv 09-2012  . Allergic rhinitis   . GERD (gastroesophageal reflux disease)     on PPI and carafte  prn  . IBS (irritable bowel syndrome)   . LBP (low back pain)   . Pulmonary nodule seen on imaging study   . OA (osteoarthritis)     Left Shoulder  . Colon polyp     Past Surgical History  Procedure Laterality Date  . Ankle surgery  ~10 years ago    left  . Shoulder surgery Left ~14 years ago    "scope"  . Thumb surgery Left ~14 years ago    x2  . Colonoscopy  [id number], Jul 19, 2011  . Trigger finger release  ~6 years ago    X2 ON RIGHT HAND  . Knee arthroscopy Right 10/13/2012    Procedure: RIGHT KNEE ARTHROSCOPY WITH DEBRIDEMENT, CHONDROPLASTY;  Surgeon: Gearlean Alf, MD;  Location: [facility];  Service: Orthopedics;  Laterality: Right;  . Back surgery  07-18-09    lower  . Knee arthroscopy Right 03/02/2013    Procedure: RIGHT ARTHROSCOPY KNEE WITH MEDIAL MENISCAL  DEBRIDEMENT;  Surgeon: Gearlean Alf, MD;  Location: [facility];  Service: Orthopedics;  Laterality: Right;  . Cholecystectomy  ~15 years ago  . Root canal    . Tubal ligation       (Not in a hospital admission) Allergies  Allergen Reactions  . Methotrexate Derivatives     Recurrent infection  . Pseudoephedrine  Makes jittery   . Vicodin [Hydrocodone-Acetaminophen]     "Makes me feel weird"    History  Substance Use Topics  . Smoking status: Never Smoker   . Smokeless tobacco: Never Used  . Alcohol Use: Yes     Comment: RARE     Family History  Problem Relation Age of Onset  . Colon cancer Neg Hx   . Heart disease Sister   . Lung cancer Brother   . Heart disease Brother   . CAD Brother   . Heart attack Mother   . Emphysema Father   . Hypertension Daughter      Review of Systems  Constitutional: Negative.   Respiratory: Negative.   Cardiovascular: Negative.   Genitourinary: Negative.   Musculoskeletal: Positive for joint pain.  Skin: Negative.   Neurological: Negative.     Objective:  Physical Exam  Constitutional: She appears well-developed and well-nourished. No distress.  HENT:  Head:  Normocephalic and atraumatic.  Eyes: EOM are normal. Pupils are equal, round, and reactive to light.  Neck: Neck supple.  Cardiovascular: Normal rate and regular rhythm.   No murmur heard. Respiratory: Breath sounds normal. No respiratory distress.  GI: Soft. Bowel sounds are normal.  Musculoskeletal:       Right knee: She exhibits no effusion, no LCL laxity and no MCL laxity. Tenderness found. Medial joint line tenderness noted.       Legs:   Vital signs in last 24 hours: BP 158/88 Resp 16  Labs:   Estimated body mass index is 32.64 kg/(m^2) as calculated from the following:   Height as of 07/19/13: 5\' 5"  (1.651 m).   Weight as of 07/19/13: 88.962 kg (196 lb 2 oz).   Imaging Review Plain radiographs demonstrate severe degenerative joint disease of the right knee(s).  The bone quality appears to be good for age and reported activity level.  Assessment/Plan:  End stage arthritis, right knee   The patient history, physical examination, clinical judgment of the provider and imaging studies are consistent with end stage degenerative joint disease of the right knee(s) and total knee arthroplasty is deemed medically necessary. The treatment options including medical management, injection therapy arthroscopy and arthroplasty were discussed at length. The risks and benefits of total knee arthroplasty were presented and reviewed. The risks due to aseptic loosening, infection, stiffness, patella tracking problems, thromboembolic complications and other imponderables were discussed. The patient acknowledged the explanation, agreed to proceed with the plan and consent was signed. Patient is being admitted for inpatient treatment for surgery, pain control, PT, OT, prophylactic antibiotics, VTE prophylaxis, progressive ambulation and ADL's and discharge planning. The patient is planning to be discharged home with home health services  NO TXA due to history of CVA.  [name], PA-C

## 2013-07-26 LAB — CBC
HEMATOCRIT: 34.6 % — AB (ref 36.0–46.0)
HEMOGLOBIN: 11.5 g/dL — AB (ref 12.0–15.0)
MCH: 27.3 pg (ref 26.0–34.0)
MCHC: 33.2 g/dL (ref 30.0–36.0)
MCV: 82.2 fL (ref 78.0–100.0)
Platelets: 272 10*3/uL (ref 150–400)
RBC: 4.21 MIL/uL (ref 3.87–5.11)
RDW: 13.6 % (ref 11.5–15.5)
WBC: 15.2 10*3/uL — ABNORMAL HIGH (ref 4.0–10.5)

## 2013-07-26 LAB — BASIC METABOLIC PANEL
BUN: 11 mg/dL (ref 6–23)
CO2: 30 mEq/L (ref 19–32)
Calcium: 9.1 mg/dL (ref 8.4–10.5)
Chloride: 99 mEq/L (ref 96–112)
Creatinine, Ser: 0.62 mg/dL (ref 0.50–1.10)
GFR calc Af Amer: >90 mL/min (ref >90)
GFR calc non Af Amer: >90 mL/min (ref >90)
GLUCOSE: 211 mg/dL — AB (ref 70–99)
POTASSIUM: 3.3 meq/L — AB (ref 3.7–5.3)
SODIUM: 140 meq/L (ref 137–147)

## 2013-07-26 MED ORDER — POTASSIUM CHLORIDE CRYS ER 20 MEQ PO TBCR
20.0000 meq | EXTENDED_RELEASE_TABLET | Freq: Two times a day (BID) | ORAL | Status: DC
  Administered 2013-07-26 – 2013-07-27 (×3): 20 meq via ORAL
  Filled 2013-07-26 (×5): qty 1

## 2013-07-26 NOTE — Progress Notes (Signed)
Subjective: 1 Day Post-Op Procedure(s) (LRB): RIGHT TOTAL KNEE ARTHROPLASTY (Right) Patient reports pain as moderate.   Patient seen in rounds with Dr. Wynelle Link. Patient is well, and has had no acute complaints or problems other than discomfort in the right knee. No issues overnight. No SOB or chest pain.  We will start therapy today.  Plan is to go Home after hospital stay.  Objective: Vital signs in last 24 hours: Temp:  [97.6 F (36.4 C)-98.2 F (36.8 C)] 97.8 F (36.6 C) (05/12 0545) Pulse Rate:  [67-102] 84 (05/12 0545) Resp:  [16-20] 17 (05/12 0545) BP: (114-162)/(71-97) 154/91 mmHg (05/12 0545) SpO2:  [91 %-100 %] 97 % (05/12 0545) FiO2 (%):  [2 %] 2 % (05/11 1010) Weight:  [88.905 kg (196 lb)] 88.905 kg (196 lb) (05/11 1010)  Intake/Output from previous day:  Intake/Output Summary (Last 24 hours) at 07/26/13 0731 Last data filed at 07/26/13 0650  Gross per 24 hour  Intake 2693.75 ml  Output   3041 ml  Net -347.25 ml     Labs:  Recent Labs  07/26/13 0450  HGB 11.5*    Recent Labs  07/26/13 0450  WBC 15.2*  RBC 4.21  HCT 34.6*  PLT 272    Recent Labs  07/26/13 0450  NA 140  K 3.3*  CL 99  CO2 30  BUN 11  CREATININE 0.62  GLUCOSE 211*  CALCIUM 9.1    EXAM General - Patient is Alert and Oriented Extremity - Neurologically intact Dorsiflexion/Plantar flexion intact Compartment soft Dressing - dressing C/D/I Motor Function - intact, moving foot and toes well on exam.  Hemovac pulled without difficulty.  Past Medical History  Diagnosis Date  . Hypertension   . Acid reflux   . Bronchitis     CHRONIC   . Anxiety     PT'S SON DIED July 02, 2010  . Pneumonia     X 3  - LAST TIME WAS 07-02-11  . H/O hiatal hernia   . Arthritis     RA AND OA  . Pain     RIGHT KNEE - TORN MENISCUS AND ACL  . Stroke 03/2011    TIA--PT EXPERIENCED NUMBNESS RT HAND AND ARM , HEADACHE AND NAUSEA. NO RESIDUAL PROBLEMS  . Borderline hypercholesterolemia     on  medication for TIA  . Environmental allergies   . Shortness of breath     with activity  . PONV (postoperative nausea and vomiting) 09-2012    sore throat after ankle surgery 6 yrs ago and severe ponv 09-2012  . Allergic rhinitis   . GERD (gastroesophageal reflux disease)     on PPI and carafte prn  . IBS (irritable bowel syndrome)   . LBP (low back pain)   . Pulmonary nodule seen on imaging study   . OA (osteoarthritis)     Left Shoulder  . Colon polyp     Assessment/Plan: 1 Day Post-Op Procedure(s) (LRB): RIGHT TOTAL KNEE ARTHROPLASTY (Right) Principal Problem:   OA (osteoarthritis) of knee  Estimated body mass index is 32.62 kg/(m^2) as calculated from the following:   Height as of this encounter: 5\' 5"  (1.651 m).   Weight as of this encounter: 88.905 kg (196 lb). Advance diet Up with therapy D/C IV fluids when tolerating POs well Discharge home with home health tomorrow  DVT Prophylaxis - Xarelto Weight-Bearing as tolerated  D/C O2 and Pulse OX and try on Room Air  Will work with PT today. Will decrease use of  IV pain meds due to some over sedation of patient.   Ardeen Jourdain, PA-C Orthopaedic Surgery 07/26/2013, 7:31 AM

## 2013-07-26 NOTE — Evaluation (Signed)
Physical Therapy Evaluation Patient Details Name: Angie Olson MRN: [id number] DOB: 25-Apr-1950 Today's Date: 07/26/2013   History of Present Illness  s/p R TKA; PMHx; HTN, back surgery  Clinical Impression  Pt will benefit from PT to address deficits below; plan is fo rHHPT, has RW    Follow Up Recommendations Home health PT    Equipment Recommendations  None recommended by PT    Recommendations for Other Services       Precautions / Restrictions Precautions Precautions: Knee Required Braces or Orthoses: Knee Immobilizer - Right Knee Immobilizer - Right: Discontinue once straight leg raise with < 10 degree lag Restrictions Weight Bearing Restrictions: No Other Position/Activity Restrictions: WBAT R LE      Mobility  Bed Mobility               General bed mobility comments: OOB with RN  Transfers Overall transfer level: Needs assistance Equipment used: Rolling walker (2 wheeled) Transfers: Sit to/from Stand Sit to Stand: Min assist         General transfer comment: cues for hand placement  Ambulation/Gait Ambulation/Gait assistance: Min assist;Min guard Ambulation Distance (Feet): 105 Feet (10' more) Assistive device: Rolling walker (2 wheeled) Gait Pattern/deviations: Step-to pattern;Step-through pattern;Decreased stride length     General Gait Details: cues for upward gaze, step length  Stairs            Wheelchair Mobility    Modified Rankin (Stroke Patients Only)       Balance                                             Pertinent Vitals/Pain 4-5/10 knee pain     Home Living Family/patient expects to be discharged to:: Private residence Living Arrangements: Spouse/significant other   Type of Home: House Home Access: Stairs to enter   Technical brewer of Steps: 4 Home Layout: Able to live on main level with bedroom/bathroom Home Equipment: Walker - 2 wheels;Bedside commode      Prior Function  Level of Independence: Independent with assistive device(s)         Comments: using walker due to knee pain mostly     Hand Dominance        Extremity/Trunk Assessment   Upper Extremity Assessment: Defer to OT evaluation           Lower Extremity Assessment: RLE deficits/detail RLE Deficits / Details:  able to assist with SLR, ankle WFL, knee flexion to  ~40*       Communication   Communication: No difficulties  Cognition Arousal/Alertness: Awake/alert Behavior During Therapy: WFL for tasks assessed/performed Overall Cognitive Status: Within Functional Limits for tasks assessed                      General Comments      Exercises Total Joint Exercises Ankle Circles/Pumps: AROM;Both;10 reps      Assessment/Plan    PT Assessment Patient needs continued PT services  PT Diagnosis Difficulty walking   PT Problem List Decreased strength;Decreased range of motion;Decreased activity tolerance;Decreased mobility;Decreased knowledge of use of DME  PT Treatment Interventions DME instruction;Gait training;Stair training;Functional mobility training;Therapeutic activities;Therapeutic exercise;Patient/family education   PT Goals (Current goals can be found in the Care Plan section) Acute Rehab PT Goals Patient Stated Goal:  home, less pain and I PT Goal Formulation: With patient Time For  Goal Achievement: 08/02/13 Potential to Achieve Goals: Good    Frequency 7X/week   Barriers to discharge        Co-evaluation               End of Session Equipment Utilized During Treatment: Gait belt Activity Tolerance: Patient tolerated treatment well Patient left: with call bell/phone within reach;in chair Nurse Communication: Mobility status;Patient requests pain meds         Time: 5465-6812 PT Time Calculation (min): 27 min   Charges:   PT Evaluation $Initial PT Evaluation Tier I: 1 Procedure PT Treatments $Gait Training: 23-37 mins   PT G  Codes:          Neil Crouch 07/26/2013, 11:29 AM

## 2013-07-26 NOTE — Care Management Note (Signed)
    Page 1 of 1   07/26/2013     10:52:41 AM CARE MANAGEMENT NOTE 07/26/2013  Patient:  Angie Olson, Angie Olson   Account Number:  [id number]  Date Initiated:  07/26/2013  Documentation initiated by:  [facility]  Subjective/Objective Assessment:   adm: right knee pain: R TOTAL KNEE ARTHROPLASTY     Action/Plan:   discharge planning   Anticipated DC Date:  07/27/2013   Anticipated DC Plan:  [place]  CM consult      [facility] Choice  HOME HEALTH   Choice offered to / List presented to:  C-1 Patient        [place] arranged  [place] PT      [place].   Status of service:  Completed, signed off Medicare Important Message given?   (If response is "NO", the following Medicare IM given date fields will be blank) Date Medicare IM given:   Date Additional Medicare IM given:    Discharge Disposition:  [place]  Per UR Regulation:    If discussed at Long Length of Stay Meetings, dates discussed:    Comments:  07/26/13 10:42 CM met with pt in room to offer choice for HHPT.  Pt chooses [facility] for HHPT.  Address and contact information verified with pt; [facility] needs to call when they reach the gate ([employer]) to gain entrance and if [facility] cell phone does not work, [facility] to use phone at [employer]. Secondary contact is husband of pt, Jeneen Rinks [phone]. No DME needed. Referral texted to [facility] rep, Kristen with contact information aforementioned.   No other CM needs were communicated.  Mariane Masters, BSN, CM [phone].

## 2013-07-26 NOTE — Progress Notes (Signed)
07/26/13 1200  PT Visit Information  Last PT Received On 07/26/13  Assistance Needed +1  History of Present Illness s/p R TKA; PMHx; HTN, back surgery  PT Time Calculation  PT Start Time 1150  PT Stop Time 1201  PT Time Calculation (min) 11 min  Subjective Data  Subjective my thigh hurts  Patient Stated Goal home, less pain and I  Precautions  Precautions Knee  Cognition  Arousal/Alertness Awake/alert  Behavior During Therapy WFL for tasks assessed/performed  Overall Cognitive Status Within Functional Limits for tasks assessed  Total Joint Exercises  Ankle Circles/Pumps AROM;Both;10 reps  Quad Sets AROM;Strengthening;Both;10 reps  Heel Slides AROM;AAROM;Right;10 reps  Knee ROM ~60* flexion  Hip ABduction/ADduction AROM;Right;10 reps  Straight Leg Raises Right;10 reps;AAROM;Strengthening  PT - End of Session  Activity Tolerance Patient tolerated treatment well  Patient left with call bell/phone within reach;in bed  Nurse Communication Mobility status;Patient requests pain meds  PT - Assessment/Plan  PT Plan Current plan remains appropriate  PT Frequency 7X/week  Follow Up Recommendations Home health PT  PT equipment None recommended by PT  PT Goal Progression  Progress towards PT goals Progressing toward goals  Acute Rehab PT Goals  Time For Goal Achievement 08/02/13  Potential to Achieve Goals Good  PT General Charges  $$ ACUTE PT VISIT 1 Procedure  PT Treatments  $Therapeutic Exercise 8-22 mins

## 2013-07-26 NOTE — Progress Notes (Signed)
OT Cancellation Note  Patient Details Name: Angie Olson MRN: [id number] DOB: [birth year]   Cancelled Treatment:    Reason Eval/Treat Not Completed: OT screened, no needs identified, will sign off. Pt states she has a 3in1 and has used it many times so she doesn't feel she needs to practice further with OT for toileting. She has a shower with a shower chair and husband can help with ADL PRN.  She declines OT needs at this time. Will sign off.  Alycia Patten Dayanara Sherrill [phone] 07/26/2013, 12:24 PM

## 2013-07-27 LAB — CBC
HCT: 33.4 % — ABNORMAL LOW (ref 36.0–46.0)
HEMOGLOBIN: 11 g/dL — AB (ref 12.0–15.0)
MCH: 27.3 pg (ref 26.0–34.0)
MCHC: 32.9 g/dL (ref 30.0–36.0)
MCV: 82.9 fL (ref 78.0–100.0)
Platelets: 264 10*3/uL (ref 150–400)
RBC: 4.03 MIL/uL (ref 3.87–5.11)
RDW: 13.6 % (ref 11.5–15.5)
WBC: 11.8 10*3/uL — ABNORMAL HIGH (ref 4.0–10.5)

## 2013-07-27 LAB — BASIC METABOLIC PANEL
BUN: 11 mg/dL (ref 6–23)
CALCIUM: 9.4 mg/dL (ref 8.4–10.5)
CO2: 32 mEq/L (ref 19–32)
CREATININE: 0.6 mg/dL (ref 0.50–1.10)
Chloride: 99 mEq/L (ref 96–112)
GFR calc Af Amer: >90 mL/min (ref >90)
GFR calc non Af Amer: >90 mL/min (ref >90)
GLUCOSE: 140 mg/dL — AB (ref 70–99)
Potassium: 3.6 mEq/L — ABNORMAL LOW (ref 3.7–5.3)
SODIUM: 140 meq/L (ref 137–147)

## 2013-07-27 MED ORDER — METHOCARBAMOL 500 MG PO TABS: 500.0000 mg | ORAL_TABLET | Freq: Four times a day (QID) | ORAL | Status: DC | PRN

## 2013-07-27 MED ORDER — RIVAROXABAN 10 MG PO TABS: 10.0000 mg | ORAL_TABLET | Freq: Every day | ORAL | Status: DC

## 2013-07-27 MED ORDER — OXYCODONE HCL 5 MG PO TABS: 5.0000 mg | ORAL_TABLET | ORAL | Status: DC | PRN

## 2013-07-27 MED ORDER — NON FORMULARY
10.0000 mg | Freq: Every morning | Status: DC
Start: 2013-07-27 — End: 2013-07-27

## 2013-07-27 MED ORDER — TRAMADOL HCL 50 MG PO TABS: 50.0000 mg | ORAL_TABLET | Freq: Four times a day (QID) | ORAL | Status: DC | PRN

## 2013-07-27 MED ORDER — NON FORMULARY: 40.0000 mg | Freq: Every day | Status: DC

## 2013-07-27 MED ORDER — CETIRIZINE HCL 10 MG PO TABS
10.0000 mg | ORAL_TABLET | Freq: Every day | ORAL | Status: DC
  Administered 2013-07-27: 10 mg via ORAL
  Filled 2013-07-27: qty 1

## 2013-07-27 MED ORDER — ESOMEPRAZOLE MAGNESIUM 40 MG PO CPDR
40.0000 mg | DELAYED_RELEASE_CAPSULE | Freq: Every day | ORAL | Status: DC
  Administered 2013-07-27: 40 mg via ORAL
  Filled 2013-07-27 (×2): qty 1

## 2013-07-27 MED ORDER — PREDNISONE 5 MG PO TABS: 5.0000 mg | ORAL_TABLET | Freq: Every morning | ORAL | Status: DC

## 2013-07-27 NOTE — Progress Notes (Signed)
Physical Therapy Treatment Patient Details Name: Angie Olson MRN: [id number] DOB: Sep 07, 1950 Today's Date: 17-Aug-2013    History of Present Illness s/p R TKA; PMHx; HTN, back surgery    PT Comments    Pt doing great, HHPT f/u  Follow Up Recommendations  Home health PT     Equipment Recommendations  None recommended by PT    Recommendations for Other Services       Precautions / Restrictions Precautions Precautions: Knee Restrictions Weight Bearing Restrictions: No    Mobility  Bed Mobility Overal bed mobility: Modified Independent                Transfers Overall transfer level: Needs assistance Equipment used: Rolling walker (2 wheeled) Transfers: Sit to/from Stand Sit to Stand: Supervision         General transfer comment: cues for hand placement  Ambulation/Gait Ambulation/Gait assistance: Supervision Ambulation Distance (Feet): 200 Feet Assistive device: Rolling walker (2 wheeled) Gait Pattern/deviations: Step-to pattern;Trunk flexed Gait velocity: decr   General Gait Details: cues for upward gaze, step length   Stairs Stairs: Yes Stairs assistance: Min assist Stair Management: One rail Left;Step to pattern;Forwards (HHA and 1 rail) Number of Stairs: 3 General stair comments: cues for sequence  Wheelchair Mobility    Modified Rankin (Stroke Patients Only)       Balance                                    Cognition Arousal/Alertness: Awake/alert Behavior During Therapy: WFL for tasks assessed/performed Overall Cognitive Status: Within Functional Limits for tasks assessed                      Exercises Total Joint Exercises Ankle Circles/Pumps: AROM;Both;10 reps Quad Sets: AROM;Strengthening;Both;10 reps Heel Slides: AROM;AAROM;Right;10 reps Hip ABduction/ADduction: AROM;Right;10 reps Straight Leg Raises: Right;10 reps;AAROM;Strengthening Knee flexion grossly 70*    General Comments         Pertinent Vitals/Pain 2/10, premedicated; instruxcted in use of ice    Home Living                      Prior Function            PT Goals (current goals can now be found in the care plan section) Acute Rehab PT Goals Patient Stated Goal:  home, less pain and I Potential to Achieve Goals: Good Progress towards PT goals: Progressing toward goals    Frequency  7X/week    PT Plan Current plan remains appropriate    Co-evaluation             End of Session Equipment Utilized During Treatment: Gait belt Activity Tolerance: Patient tolerated treatment well Patient left: in bed;with call bell/phone within reach     Time: 0922-0948 PT Time Calculation (min): 26 min  Charges:  $Gait Training: 8-22 mins $Therapeutic Exercise: 8-22 mins                    G Codes:      Angie Olson August 17, 2013, 9:49 AM

## 2013-07-27 NOTE — Discharge Summary (Signed)
Physician Discharge Summary   Patient ID: Angie Olson MRN: [id number] DOB/AGE: 10-27-1950 63 y.o.  Admit date: 07/25/2013 Discharge date: 07/27/2013  Primary Diagnosis:  Osteoarthritis Right knee(s)  Admission Diagnoses:  Past Medical History  Diagnosis Date  . Hypertension   . Acid reflux   . Bronchitis     CHRONIC   . Anxiety     PT'S SON DIED May 15, 2010  . Pneumonia     X 3  - LAST TIME WAS 2011/05/16  . H/O hiatal hernia   . Arthritis     RA AND OA  . Pain     RIGHT KNEE - TORN MENISCUS AND ACL  . Stroke 03/2011    TIA--PT EXPERIENCED NUMBNESS RT HAND AND ARM , HEADACHE AND NAUSEA. NO RESIDUAL PROBLEMS  . Borderline hypercholesterolemia     on medication for TIA  . Environmental allergies   . Shortness of breath     with activity  . PONV (postoperative nausea and vomiting) 09-2012    sore throat after ankle surgery 6 yrs ago and severe ponv 09-2012  . Allergic rhinitis   . GERD (gastroesophageal reflux disease)     on PPI and carafte prn  . IBS (irritable bowel syndrome)   . LBP (low back pain)   . Pulmonary nodule seen on imaging study   . OA (osteoarthritis)     Left Shoulder  . Colon polyp    Discharge Diagnoses:   Principal Problem:   OA (osteoarthritis) of knee  Estimated body mass index is 32.62 kg/(m^2) as calculated from the following:   Height as of this encounter: 5' 5"  (1.651 m).   Weight as of this encounter: 88.905 kg (196 lb).  Procedure:  Procedure(s) (LRB): RIGHT TOTAL KNEE ARTHROPLASTY (Right)   Consults: None  HPI: Angie Olson is a 63 y.o. year old female with end stage OA of her right knee with progressively worsening pain and dysfunction. She has constant pain, with activity and at rest and significant functional deficits with difficulties even with ADLs. She has had extensive non-op management including analgesics, injections of cortisone, viscosupplements, arthroscopy and home exercise program, but remains in significant pain with  significant dysfunction.Radiographs show bone on bone arthritis medial and patellofemoral. She presents now for right Total Knee Arthroplasty.   Laboratory Data: Admission on 07/25/2013, Discharged on 07/27/2013  Component Date Value Ref Range Status  . ABO/RH(D) 07/25/2013 A NEG   Final  . Antibody Screen 07/25/2013 NEG   Final  . Sample Expiration 07/25/2013 07/28/2013   Final  . ABO/RH(D) 07/25/2013 A NEG   Final  . WBC 07/26/2013 15.2* 4.0 - 10.5 K/uL Final  . RBC 07/26/2013 4.21  3.87 - 5.11 MIL/uL Final  . Hemoglobin 07/26/2013 11.5* 12.0 - 15.0 g/dL Final  . HCT 07/26/2013 34.6* 36.0 - 46.0 % Final  . MCV 07/26/2013 82.2  78.0 - 100.0 fL Final  . MCH 07/26/2013 27.3  26.0 - 34.0 pg Final  . MCHC 07/26/2013 33.2  30.0 - 36.0 g/dL Final  . RDW 07/26/2013 13.6  11.5 - 15.5 % Final  . Platelets 07/26/2013 272  150 - 400 K/uL Final  . Sodium 07/26/2013 140  137 - 147 mEq/L Final  . Potassium 07/26/2013 3.3* 3.7 - 5.3 mEq/L Final  . Chloride 07/26/2013 99  96 - 112 mEq/L Final  . CO2 07/26/2013 30  19 - 32 mEq/L Final  . Glucose, Bld 07/26/2013 211* 70 - 99 mg/dL Final  . BUN 07/26/2013  11  6 - 23 mg/dL Final  . Creatinine, Ser 07/26/2013 0.62  0.50 - 1.10 mg/dL Final  . Calcium 07/26/2013 9.1  8.4 - 10.5 mg/dL Final  . GFR calc non Af Amer 07/26/2013 >90  >90 mL/min Final  . GFR calc Af Amer 07/26/2013 >90  >90 mL/min Final   Comment: (NOTE)                          The eGFR has been calculated using the CKD EPI equation.                          This calculation has not been validated in all clinical situations.                          eGFR's persistently <90 mL/min signify possible Chronic Kidney                          Disease.  . WBC 07/27/2013 11.8* 4.0 - 10.5 K/uL Final  . RBC 07/27/2013 4.03  3.87 - 5.11 MIL/uL Final  . Hemoglobin 07/27/2013 11.0* 12.0 - 15.0 g/dL Final  . HCT 07/27/2013 33.4* 36.0 - 46.0 % Final  . MCV 07/27/2013 82.9  78.0 - 100.0 fL Final  . MCH  07/27/2013 27.3  26.0 - 34.0 pg Final  . MCHC 07/27/2013 32.9  30.0 - 36.0 g/dL Final  . RDW 07/27/2013 13.6  11.5 - 15.5 % Final  . Platelets 07/27/2013 264  150 - 400 K/uL Final  . Sodium 07/27/2013 140  137 - 147 mEq/L Final  . Potassium 07/27/2013 3.6* 3.7 - 5.3 mEq/L Final  . Chloride 07/27/2013 99  96 - 112 mEq/L Final  . CO2 07/27/2013 32  19 - 32 mEq/L Final  . Glucose, Bld 07/27/2013 140* 70 - 99 mg/dL Final  . BUN 07/27/2013 11  6 - 23 mg/dL Final  . Creatinine, Ser 07/27/2013 0.60  0.50 - 1.10 mg/dL Final  . Calcium 07/27/2013 9.4  8.4 - 10.5 mg/dL Final  . GFR calc non Af Amer 07/27/2013 >90  >90 mL/min Final  . GFR calc Af Amer 07/27/2013 >90  >90 mL/min Final   Comment: (NOTE)                          The eGFR has been calculated using the CKD EPI equation.                          This calculation has not been validated in all clinical situations.                          eGFR's persistently <90 mL/min signify possible Chronic Kidney                          Disease.  Hospital Outpatient Visit on 07/19/2013  Component Date Value Ref Range Status  . MRSA, PCR 07/19/2013 NEGATIVE  NEGATIVE Final  . Staphylococcus aureus 07/19/2013 NEGATIVE  NEGATIVE Final   Comment:                                 The Xpert SA  Assay (FDA                          approved for NASAL specimens                          in patients over 61 years of age),                          is one component of                          a comprehensive surveillance                          program.  Test performance has                          been validated by [employer] for patients greater                          than or equal to 2 year old.                          It is not intended                          to diagnose infection nor to                          guide or monitor treatment.  Marland Kitchen aPTT 07/19/2013 26  24 - 37 seconds Final  . WBC 07/19/2013 6.3  4.0 - 10.5  K/uL Final  . RBC 07/19/2013 4.81  3.87 - 5.11 MIL/uL Final  . Hemoglobin 07/19/2013 13.2  12.0 - 15.0 g/dL Final  . HCT 07/19/2013 40.0  36.0 - 46.0 % Final  . MCV 07/19/2013 83.2  78.0 - 100.0 fL Final  . MCH 07/19/2013 27.4  26.0 - 34.0 pg Final  . MCHC 07/19/2013 33.0  30.0 - 36.0 g/dL Final  . RDW 07/19/2013 13.4  11.5 - 15.5 % Final  . Platelets 07/19/2013 274  150 - 400 K/uL Final  . Sodium 07/19/2013 143  137 - 147 mEq/L Final  . Potassium 07/19/2013 3.4* 3.7 - 5.3 mEq/L Final  . Chloride 07/19/2013 101  96 - 112 mEq/L Final  . CO2 07/19/2013 30  19 - 32 mEq/L Final  . Glucose, Bld 07/19/2013 125* 70 - 99 mg/dL Final  . BUN 07/19/2013 15  6 - 23 mg/dL Final  . Creatinine, Ser 07/19/2013 0.76  0.50 - 1.10 mg/dL Final  . Calcium 07/19/2013 9.9  8.4 - 10.5 mg/dL Final  . Total Protein 07/19/2013 6.5  6.0 - 8.3 g/dL Final  . Albumin 07/19/2013 3.7  3.5 - 5.2 g/dL Final  . AST 07/19/2013 25  0 - 37 U/L Final  . ALT 07/19/2013 31  0 - 35 U/L Final  . Alkaline Phosphatase 07/19/2013 52  39 - 117 U/L Final  . Total Bilirubin 07/19/2013 0.4  0.3 - 1.2 mg/dL Final  . GFR calc  non Af Amer 07/19/2013 89* >90 mL/min Final  . GFR calc Af Amer 07/19/2013 >90  >90 mL/min Final   Comment: (NOTE)                          The eGFR has been calculated using the CKD EPI equation.                          This calculation has not been validated in all clinical situations.                          eGFR's persistently <90 mL/min signify possible Chronic Kidney                          Disease.  Marland Kitchen Prothrombin Time 07/19/2013 12.6  11.6 - 15.2 seconds Final  . INR 07/19/2013 0.96  0.00 - 1.49 Final  . Color, Urine 07/19/2013 YELLOW  YELLOW Final  . APPearance 07/19/2013 CLEAR  CLEAR Final  . Specific Gravity, Urine 07/19/2013 1.027  1.005 - 1.030 Final  . pH 07/19/2013 6.0  5.0 - 8.0 Final  . Glucose, UA 07/19/2013 NEGATIVE  NEGATIVE mg/dL Final  . Hgb urine dipstick 07/19/2013 NEGATIVE  NEGATIVE  Final  . Bilirubin Urine 07/19/2013 NEGATIVE  NEGATIVE Final  . Ketones, ur 07/19/2013 NEGATIVE  NEGATIVE mg/dL Final  . Protein, ur 07/19/2013 NEGATIVE  NEGATIVE mg/dL Final  . Urobilinogen, UA 07/19/2013 0.2  0.0 - 1.0 mg/dL Final  . Nitrite 07/19/2013 NEGATIVE  NEGATIVE Final  . Leukocytes, UA 07/19/2013 NEGATIVE  NEGATIVE Final   MICROSCOPIC NOT DONE ON URINES WITH NEGATIVE PROTEIN, BLOOD, LEUKOCYTES, NITRITE, OR GLUCOSE <1000 mg/dL.  Hospital Outpatient Visit on 06/13/2013  Component Date Value Ref Range Status  . MRSA, PCR 06/13/2013 NEGATIVE  NEGATIVE Final  . Staphylococcus aureus 06/13/2013 NEGATIVE  NEGATIVE Final   Comment:                                 The Xpert SA Assay (FDA                          approved for NASAL specimens                          in patients over 59 years of age),                          is one component of                          a comprehensive surveillance                          program.  Test performance has                          been validated by [employer] for patients greater  than or equal to 19 year old.                          It is not intended                          to diagnose infection nor to                          guide or monitor treatment.  Marland Kitchen aPTT 06/13/2013 27  24 - 37 seconds Final  . WBC 06/13/2013 6.3  4.0 - 10.5 K/uL Final  . RBC 06/13/2013 4.79  3.87 - 5.11 MIL/uL Final  . Hemoglobin 06/13/2013 13.6  12.0 - 15.0 g/dL Final  . HCT 06/13/2013 40.3  36.0 - 46.0 % Final  . MCV 06/13/2013 84.1  78.0 - 100.0 fL Final  . MCH 06/13/2013 28.4  26.0 - 34.0 pg Final  . MCHC 06/13/2013 33.7  30.0 - 36.0 g/dL Final  . RDW 06/13/2013 13.6  11.5 - 15.5 % Final  . Platelets 06/13/2013 301  150 - 400 K/uL Final  . Sodium 06/13/2013 145  137 - 147 mEq/L Final  . Potassium 06/13/2013 3.7  3.7 - 5.3 mEq/L Final  . Chloride 06/13/2013 101  96 - 112 mEq/L Final  . CO2 06/13/2013  32  19 - 32 mEq/L Final  . Glucose, Bld 06/13/2013 110* 70 - 99 mg/dL Final  . BUN 06/13/2013 16  6 - 23 mg/dL Final  . Creatinine, Ser 06/13/2013 0.78  0.50 - 1.10 mg/dL Final  . Calcium 06/13/2013 10.1  8.4 - 10.5 mg/dL Final  . Total Protein 06/13/2013 6.6  6.0 - 8.3 g/dL Final  . Albumin 06/13/2013 3.9  3.5 - 5.2 g/dL Final  . AST 06/13/2013 22  0 - 37 U/L Final  . ALT 06/13/2013 32  0 - 35 U/L Final  . Alkaline Phosphatase 06/13/2013 49  39 - 117 U/L Final  . Total Bilirubin 06/13/2013 0.3  0.3 - 1.2 mg/dL Final  . GFR calc non Af Amer 06/13/2013 88* >90 mL/min Final  . GFR calc Af Amer 06/13/2013 >90  >90 mL/min Final   Comment: (NOTE)                          The eGFR has been calculated using the CKD EPI equation.                          This calculation has not been validated in all clinical situations.                          eGFR's persistently <90 mL/min signify possible Chronic Kidney                          Disease.  Marland Kitchen Prothrombin Time 06/13/2013 13.0  11.6 - 15.2 seconds Final  . INR 06/13/2013 1.00  0.00 - 1.49 Final  . Color, Urine 06/13/2013 YELLOW  YELLOW Final  . APPearance 06/13/2013 CLEAR  CLEAR Final  . Specific Gravity, Urine 06/13/2013 1.026  1.005 - 1.030 Final  . pH 06/13/2013 5.5  5.0 - 8.0 Final  . Glucose, UA 06/13/2013 NEGATIVE  NEGATIVE mg/dL Final  . Hgb urine dipstick 06/13/2013 NEGATIVE  NEGATIVE Final  . Bilirubin Urine 06/13/2013 NEGATIVE  NEGATIVE Final  . Ketones, ur 06/13/2013 NEGATIVE  NEGATIVE mg/dL Final  . Protein, ur 06/13/2013 NEGATIVE  NEGATIVE mg/dL Final  . Urobilinogen, UA 06/13/2013 0.2  0.0 - 1.0 mg/dL Final  . Nitrite 06/13/2013 NEGATIVE  NEGATIVE Final  . Leukocytes, UA 06/13/2013 NEGATIVE  NEGATIVE Final   MICROSCOPIC NOT DONE ON URINES WITH NEGATIVE PROTEIN, BLOOD, LEUKOCYTES, NITRITE, OR GLUCOSE <1000 mg/dL.     X-Rays:No results found.  EKG: Orders placed in visit on 07/18/13  . EKG     Hospital Course: Angie Olson is a 63 y.o. who was admitted to [facility]. They were brought to the operating room on 07/25/2013 and underwent Procedure(s): RIGHT TOTAL KNEE ARTHROPLASTY.  Patient tolerated the procedure well and was later transferred to the recovery room and then to the orthopaedic floor for postoperative care.  They were given PO and IV analgesics for pain control following their surgery.  They were given 24 hours of postoperative antibiotics of  Anti-infectives   Start     Dose/Rate Route Frequency Ordered Stop   07/25/13 1300  ceFAZolin (ANCEF) IVPB 2 g/50 mL premix     2 g 100 mL/hr over 30 Minutes Intravenous Every 6 hours 07/25/13 1024 07/25/13 1923   07/25/13 0513  ceFAZolin (ANCEF) IVPB 2 g/50 mL premix     2 g 100 mL/hr over 30 Minutes Intravenous On call to O.R. 07/25/13 0513 07/25/13 0700     and started on DVT prophylaxis in the form of Xarelto.   PT and OT were ordered for total joint protocol.  Discharge planning consulted to help with postop disposition and equipment needs.  Patient had a decent night on the evening of surgery.  They started to get up OOB with therapy on day one. Hemovac drain was pulled without difficulty.  Continued to work with therapy into day two despite a headache that morning.  Dressing was changed on day two and the incision was healing well.  Patient was seen in rounds and was ready to go home.   Discharge home with home health  Diet - Cardiac diet  Follow up - in 2 weeks  Activity - WBAT  Disposition - Home  Condition Upon Discharge - Good  D/C Meds - See DC Summary  DVT Prophylaxis - Xarelto       Discharge Instructions   Call MD / Call 911    Complete by:  As directed   If you experience chest pain or shortness of breath, CALL 911 and be transported to the hospital emergency room.  If you develope a fever above 101 F, pus (white drainage) or increased drainage or redness at the wound, or calf pain, call your surgeon's office.     Change  dressing    Complete by:  As directed   Change dressing daily with sterile 4 x 4 inch gauze dressing and apply TED hose. Do not submerge the incision under water.     Constipation Prevention    Complete by:  As directed   Drink plenty of fluids.  Prune juice may be helpful.  You may use a stool softener, such as Colace (over the counter) 100 mg twice a day.  Use MiraLax (over the counter) for constipation as needed.     Diet - low sodium heart healthy    Complete by:  As directed      Discharge instructions    Complete by:  As directed   Pick up stool softner and laxative for home. Do not submerge incision under water. May shower. Continue to use ice for pain and swelling from surgery.  Take Xarelto for two and a half more weeks, then discontinue Xarelto. Once the patient has completed the blood thinner regimen, then take a Baby 81 mg Aspirin daily for three more weeks.     Do not put a pillow under the knee. Place it under the heel.    Complete by:  As directed      Do not sit on low chairs, stoools or toilet seats, as it may be difficult to get up from low surfaces    Complete by:  As directed      Driving restrictions    Complete by:  As directed   No driving until released by the physician.     Increase activity slowly as tolerated    Complete by:  As directed      Lifting restrictions    Complete by:  As directed   No lifting until released by the physician.     Patient may shower    Complete by:  As directed   You may shower without a dressing once there is no drainage.  Do not wash over the wound.  If drainage remains, do not shower until drainage stops.     TED hose    Complete by:  As directed   Use stockings (TED hose) for 3 weeks on both leg(s).  You may remove them at night for sleeping.     Weight bearing as tolerated    Complete by:  As directed             Medication List    STOP taking these medications       diclofenac sodium 1 % Gel  Commonly known as:   VOLTAREN     etanercept 50 MG/ML injection  Commonly known as:  ENBREL     leflunomide 20 MG tablet  Commonly known as:  ARAVA      TAKE these medications       ALPRAZolam 1 MG tablet  Commonly known as:  XANAX  Take 0.5 mg by mouth at bedtime.     atorvastatin 20 MG tablet  Commonly known as:  LIPITOR  Take 20 mg by mouth every morning.     esomeprazole 40 MG capsule  Commonly known as:  NEXIUM  Take 40 mg by mouth daily before breakfast.     hydrochlorothiazide 25 MG tablet  Commonly known as:  HYDRODIURIL  Take 25 mg by mouth every morning.     hydrochlorothiazide 25 MG tablet  Commonly known as:  HYDRODIURIL  Take 25 mg by mouth every morning.     methocarbamol 500 MG tablet  Commonly known as:  ROBAXIN  Take 1 tablet (500 mg total) by mouth every 6 (six) hours as needed for muscle spasms.     montelukast 10 MG tablet  Commonly known as:  SINGULAIR  Take 10 mg by mouth every evening.     oxyCODONE 5 MG immediate release tablet  Commonly known as:  Oxy IR/ROXICODONE  Take 1-2 tablets (5-10 mg total) by mouth every 3 (three) hours as needed for moderate pain, severe pain or breakthrough pain.     PATANASE 0.6 % Soln  Generic drug:  Olopatadine HCl  Place 1 puff into the nose daily.     predniSONE 5 MG tablet  Commonly known as:  DELTASONE  Take 1 tablet (5 mg total) by mouth every morning. Take two tablets on the evening of 07/27/2013, then one tablet twice a day on 07/28/2013, then resume daily on 07/29/2013.     rivaroxaban 10 MG Tabs tablet  Commonly known as:  XARELTO  - Take 1 tablet (10 mg total) by mouth daily with breakfast. Take Xarelto for two and a half more weeks, then discontinue Xarelto.  - Once the patient has completed the blood thinner regimen, then take a Baby 81 mg Aspirin daily for three more weeks.     sucralfate 1 G tablet  Commonly known as:  CARAFATE  Take 1 g by mouth 2 (two) times daily as needed (for indigestion).     traMADol 50  MG tablet  Commonly known as:  ULTRAM  Take 1 tablet (50 mg total) by mouth every 6 (six) hours as needed (mild to moderate pain).       Follow-up Information   Follow up with [facility] (home health physical therapy)    Contact information:   [address] [place] [address] [phone]       Follow up with Gearlean Alf, MD. Schedule an appointment as soon as possible for a visit on 08/09/2013.   Specialty:  Orthopedic Surgery   Contact information:   [address] [place] [address] [phone]       Signed: [name], PA-C Orthopaedic Surgery 08/15/2013, 7:31 AM

## 2013-07-27 NOTE — Progress Notes (Signed)
Subjective: 2 Days Post-Op Procedure(s) (LRB): RIGHT TOTAL KNEE ARTHROPLASTY (Right) Patient reports pain as mild and moderate.   Patient seen in rounds for Dr. Wynelle Link.  Headache this morning.  Has not had her Zyrtec for the past few days. Patient is well, but has had some minor complaints of pain in the knee, requiring pain medications Patient is ready to go home today  Objective: Vital signs in last 24 hours: Temp:  [98.3 F (36.8 C)-99.4 F (37.4 C)] 99.4 F (37.4 C) (05/13 0445) Pulse Rate:  [79-89] 79 (05/13 0445) Resp:  [16] 16 (05/13 0445) BP: (132-164)/(77-96) 160/91 mmHg (05/13 0445) SpO2:  [92 %-96 %] 92 % (05/13 0445)  Intake/Output from previous day:  Intake/Output Summary (Last 24 hours) at 07/27/13 0732 Last data filed at 07/26/13 1700  Gross per 24 hour  Intake    480 ml  Output   1400 ml  Net   -920 ml    Intake/Output this shift:    Labs:  Recent Labs  07/26/13 0450 07/27/13 0400  HGB 11.5* 11.0*    Recent Labs  07/26/13 0450 07/27/13 0400  WBC 15.2* 11.8*  RBC 4.21 4.03  HCT 34.6* 33.4*  PLT 272 264    Recent Labs  07/26/13 0450 07/27/13 0400  NA 140 140  K 3.3* 3.6*  CL 99 99  CO2 30 32  BUN 11 11  CREATININE 0.62 0.60  GLUCOSE 211* 140*  CALCIUM 9.1 9.4   No results found for this basename: LABPT, INR,  in the last 72 hours  EXAM: General - Patient is Alert, Appropriate and Oriented Extremity - Neurovascular intact Sensation intact distally Dorsiflexion/Plantar flexion intact No cellulitis present Incision - clean, dry, no drainage, healing Motor Function - intact, moving foot and toes well on exam.   Assessment/Plan: 2 Days Post-Op Procedure(s) (LRB): RIGHT TOTAL KNEE ARTHROPLASTY (Right) Procedure(s) (LRB): RIGHT TOTAL KNEE ARTHROPLASTY (Right) Past Medical History  Diagnosis Date  . Hypertension   . Acid reflux   . Bronchitis     CHRONIC   . Anxiety     PT'S SON DIED 07-16-2010  . Pneumonia     X 3  - LAST  TIME WAS 07/16/2011  . H/O hiatal hernia   . Arthritis     RA AND OA  . Pain     RIGHT KNEE - TORN MENISCUS AND ACL  . Stroke 03/2011    TIA--PT EXPERIENCED NUMBNESS RT HAND AND ARM , HEADACHE AND NAUSEA. NO RESIDUAL PROBLEMS  . Borderline hypercholesterolemia     on medication for TIA  . Environmental allergies   . Shortness of breath     with activity  . PONV (postoperative nausea and vomiting) 09-2012    sore throat after ankle surgery 6 yrs ago and severe ponv 09-2012  . Allergic rhinitis   . GERD (gastroesophageal reflux disease)     on PPI and carafte prn  . IBS (irritable bowel syndrome)   . LBP (low back pain)   . Pulmonary nodule seen on imaging study   . OA (osteoarthritis)     Left Shoulder  . Colon polyp    Principal Problem:   OA (osteoarthritis) of knee  Estimated body mass index is 32.62 kg/(m^2) as calculated from the following:   Height as of this encounter: 5\' 5"  (1.651 m).   Weight as of this encounter: 88.905 kg (196 lb). Up with therapy Discharge home with home health Diet - Cardiac diet Follow up -  in 2 weeks Activity - WBAT Disposition - Home Condition Upon Discharge - Good D/C Meds - See DC Summary DVT Prophylaxis - Xarelto  [name], PA-C Orthopaedic Surgery 07/27/2013, 7:32 AM

## 2013-07-27 NOTE — Discharge Instructions (Addendum)
° °Dr. Frank Aluisio °Total Joint Specialist °[facility] °[address] °[place], [place] [address] °[phone] ° °TOTAL KNEE REPLACEMENT POSTOPERATIVE DIRECTIONS ° ° ° °Knee Rehabilitation, Guidelines Following Surgery  °Results after knee surgery are often greatly improved when you follow the exercise, range of motion and muscle strengthening exercises prescribed by your doctor. Safety measures are also important to protect the knee from further injury. Any time any of these exercises cause you to have increased pain or swelling in your knee joint, decrease the amount until you are comfortable again and slowly increase them. If you have problems or questions, call your caregiver or physical therapist for advice.  ° °HOME CARE INSTRUCTIONS  °Remove items at home which could result in a fall. This includes throw rugs or furniture in walking pathways.  °Continue medications as instructed at time of discharge. °You may have some home medications which will be placed on hold until you complete the course of blood thinner medication.  °You may start showering once you are discharged home but do not submerge the incision under water. Just pat the incision dry and apply a dry gauze dressing on daily. °Walk with walker as instructed.  °You may resume a sexual relationship in one month or when given the OK by  your doctor.  °· Use walker as long as suggested by your caregivers. °· Avoid periods of inactivity such as sitting longer than an hour when not asleep. This helps prevent blood clots.  °You may put full weight on your legs and walk as much as is comfortable.  °You may return to work once you are cleared by your doctor.  °Do not drive a car for 6 weeks or until released by you surgeon.  °· Do not drive while taking narcotics.  °Wear the elastic stockings for three weeks following surgery during the day but you may remove then at night. °Make sure you keep all of your appointments after your  operation with all of your doctors and caregivers. You should call the office at the above phone number and make an appointment for approximately two weeks after the date of your surgery. °Change the dressing daily and reapply a dry dressing each time. °Please pick up a stool softener and laxative for home use as long as you are requiring pain medications. °· Continue to use ice on the knee for pain and swelling from surgery. You may notice swelling that will progress down to the foot and ankle.  This is normal after surgery.  Elevate the leg when you are not up walking on it.   °It is important for you to complete the blood thinner medication as prescribed by your doctor. °· Continue to use the breathing machine which will help keep your temperature down.  It is common for your temperature to cycle up and down following surgery, especially at night when you are not up moving around and exerting yourself.  The breathing machine keeps your lungs expanded and your temperature down. ° °RANGE OF MOTION AND STRENGTHENING EXERCISES  °Rehabilitation of the knee is important following a knee injury or an operation. After just a few days of immobilization, the muscles of the thigh which control the knee become weakened and shrink (atrophy). Knee exercises are designed to build up the tone and strength of the thigh muscles and to improve knee motion. Often times heat used for twenty to thirty minutes before working out will loosen up your tissues and help with improving the   range of motion but do not use heat for the first two weeks following surgery. These exercises can be done on a training (exercise) mat, on the floor, on a table or on a bed. Use what ever works the best and is most comfortable for you Knee exercises include:  °Leg Lifts - While your knee is still immobilized in a splint or cast, you can do straight leg raises. Lift the leg to 60 degrees, hold for 3 sec, and slowly lower the leg. Repeat 10-20 times 2-3  times daily. Perform this exercise against resistance later as your knee gets better.  °Quad and Hamstring Sets - Tighten up the muscle on the front of the thigh (Quad) and hold for 5-10 sec. Repeat this 10-20 times hourly. Hamstring sets are done by pushing the foot backward against an object and holding for 5-10 sec. Repeat as with quad sets.  °A rehabilitation program following serious knee injuries can speed recovery and prevent re-injury in the future due to weakened muscles. Contact your doctor or a physical therapist for more information on knee rehabilitation.  ° °SKILLED REHAB INSTRUCTIONS: °If the patient is transferred to a skilled rehab facility following release from the hospital, a list of the current medications will be sent to the facility for the patient to continue.  When discharged from the skilled rehab facility, please have the facility set up the patient's Home Health Physical Therapy prior to being released. Also, the skilled facility will be responsible for providing the patient with their medications at time of release from the facility to include their pain medication, the muscle relaxants, and their blood thinner medication. If the patient is still at the rehab facility at time of the two week follow up appointment, the skilled rehab facility will also need to assist the patient in arranging follow up appointment in our office and any transportation needs. ° °MAKE SURE YOU:  °Understand these instructions.  °Will watch your condition.  °Will get help right away if you are not doing well or get worse.  ° ° °Pick up stool softner and laxative for home. °Do not submerge incision under water. °May shower. °Continue to use ice for pain and swelling from surgery. ° °Take Xarelto for two and a half more weeks, then discontinue Xarelto. °Once the patient has completed the blood thinner regimen, then take a Baby 81 mg Aspirin daily for three more weeks. ° ° ° ° ° ° ° ° °Information on my medicine -  XARELTO® (Rivaroxaban) ° °This medication education was reviewed with me or my healthcare representative as part of my discharge preparation.  The pharmacist that spoke with me during my hospital stay was:  Trelyn Vanderlinde E Rashad Auld, RPH ° °Why was Xarelto® prescribed for you? °Xarelto® was prescribed for you to reduce the risk of blood clots forming after orthopedic surgery. The medical term for these abnormal blood clots is venous thromboembolism (VTE). ° °What do you need to know about xarelto® ? °Take your Xarelto® ONCE DAILY at the same time every day. °You may take it either with or without food. ° °If you have difficulty swallowing the tablet whole, you may crush it and mix in applesauce just prior to taking your dose. ° °Take Xarelto® exactly as prescribed by your doctor and DO NOT stop taking Xarelto® without talking to the doctor who prescribed the medication.  Stopping without other VTE prevention medication to take the place of Xarelto® may increase your risk of developing a clot. ° °After   discharge, you should have regular check-up appointments with your healthcare provider that is prescribing your Xarelto®.   ° °What do you do if you miss a dose? °If you miss a dose, take it as soon as you remember on the same day then continue your regularly scheduled once daily regimen the next day. Do not take two doses of Xarelto® on the same day.  ° °Important Safety Information °A possible side effect of Xarelto® is bleeding. You should call your healthcare provider right away if you experience any of the following: °  Bleeding from an injury or your nose that does not stop. °  Unusual colored urine (red or dark brown) or unusual colored stools (red or black). °  Unusual bruising for unknown reasons. °  A serious fall or if you hit your head (even if there is no bleeding). ° °Some medicines may interact with Xarelto® and might increase your risk of bleeding while on Xarelto®. To help avoid this, consult your healthcare  provider or pharmacist prior to using any new prescription or non-prescription medications, including herbals, vitamins, non-steroidal anti-inflammatory drugs (NSAIDs) and supplements. ° °This website has more information on Xarelto®: www.xarelto.com. ° ° ° °

## 2013-09-22 DIAGNOSIS — M5126 Other intervertebral disc displacement, lumbar region: Secondary | ICD-10-CM | POA: Insufficient documentation

## 2013-11-09 ENCOUNTER — Other Ambulatory Visit: Payer: Self-pay | Admitting: Neurosurgery

## 2013-11-25 ENCOUNTER — Encounter (HOSPITAL_COMMUNITY): Payer: Self-pay

## 2013-11-25 ENCOUNTER — Encounter (HOSPITAL_COMMUNITY)
Admission: RE | Admit: 2013-11-25 | Discharge: 2013-11-25 | Disposition: A | Discharge status: Home / Self Care | Payer: 59 | Source: Ambulatory Visit | Attending: Anesthesiology | Admitting: Anesthesiology

## 2013-11-25 ENCOUNTER — Encounter (HOSPITAL_COMMUNITY)
Admission: RE | Admit: 2013-11-25 | Discharge: 2013-11-25 | Disposition: A | Discharge status: Home / Self Care | Payer: 59 | Source: Ambulatory Visit | Attending: Neurosurgery | Admitting: Neurosurgery

## 2013-11-25 ENCOUNTER — Encounter (HOSPITAL_COMMUNITY): Payer: Self-pay | Admitting: Pharmacy Technician

## 2013-11-25 DIAGNOSIS — Z01810 Encounter for preprocedural cardiovascular examination: Secondary | ICD-10-CM | POA: Diagnosis present

## 2013-11-25 DIAGNOSIS — Z01812 Encounter for preprocedural laboratory examination: Secondary | ICD-10-CM | POA: Insufficient documentation

## 2013-11-25 DIAGNOSIS — M5126 Other intervertebral disc displacement, lumbar region: Secondary | ICD-10-CM | POA: Insufficient documentation

## 2013-11-25 LAB — BASIC METABOLIC PANEL
Anion gap: 14 (ref 5–15)
BUN: 17 mg/dL (ref 6–23)
CALCIUM: 9.7 mg/dL (ref 8.4–10.5)
CO2: 27 mEq/L (ref 19–32)
Chloride: 99 mEq/L (ref 96–112)
Creatinine, Ser: 0.64 mg/dL (ref 0.50–1.10)
GFR calc non Af Amer: >90 mL/min (ref >90)
Glucose, Bld: 134 mg/dL — ABNORMAL HIGH (ref 70–99)
Potassium: 3.5 mEq/L — ABNORMAL LOW (ref 3.7–5.3)
Sodium: 140 mEq/L (ref 137–147)

## 2013-11-25 LAB — CBC
HCT: 41.4 % (ref 36.0–46.0)
Hemoglobin: 13.6 g/dL (ref 12.0–15.0)
MCH: 27.9 pg (ref 26.0–34.0)
MCHC: 32.9 g/dL (ref 30.0–36.0)
MCV: 84.8 fL (ref 78.0–100.0)
PLATELETS: 308 10*3/uL (ref 150–400)
RBC: 4.88 MIL/uL (ref 3.87–5.11)
RDW: 15.5 % (ref 11.5–15.5)
WBC: 7.5 10*3/uL (ref 4.0–10.5)

## 2013-11-25 LAB — SURGICAL PCR SCREEN
MRSA, PCR: NEGATIVE
STAPHYLOCOCCUS AUREUS: NEGATIVE

## 2013-11-25 LAB — TYPE AND SCREEN
ABO/RH(D): A NEG
Antibody Screen: NEGATIVE

## 2013-11-25 NOTE — Pre-Procedure Instructions (Addendum)
Angie Olson  11/25/2013   Your procedure is scheduled on:  12/05/13  Report to [facility] short stay admitting at 530 AM.  Call this number if you have problems the morning of surgery: [phone]   Remember:   Do not eat food or drink liquids after midnight.   Take these medicines the morning of surgery with A SIP OF WATER: xanax,nexium, cyclobenzaprine, singulair, pain med if needed     Take all meds as ordered until day of surgery except as instructed below or per dr  Bridgette Habermann all herbel meds, nsaids (aleve,naproxen,advil,ibuprofen) 5 days prior to surgery(11/30/13) including vitamins, aspirin,leflunomide      Do not wear jewelry, make-up or nail polish.  Do not wear lotions, powders, or perfumes. You may wear deodorant.  Do not shave 48 hours prior to surgery. Men may shave face and neck.  Do not bring valuables to the hospital.  [facility] is not responsible                  for any belongings or valuables.               Contacts, dentures or bridgework may not be worn into surgery.  Leave suitcase in the car. After surgery it may be brought to your room.  For patients admitted to the hospital, discharge time is determined by your                treatment team.               Patients discharged the day of surgery will not be allowed to drive  home.  Name and phone number of your driver:   Special Instructions:  Special Instructions: Angie Olson - Preparing for Surgery  Before surgery, you can play an important role.  Because skin is not sterile, your skin needs to be as free of germs as possible.  You can reduce the number of germs on you skin by washing with CHG (chlorahexidine gluconate) soap before surgery.  CHG is an antiseptic cleaner which kills germs and bonds with the skin to continue killing germs even after washing.  Please DO NOT use if you have an allergy to CHG or antibacterial soaps.  If your skin becomes reddened/irritated stop using the CHG and inform your nurse  when you arrive at Short Stay.  Do not shave (including legs and underarms) for at least 48 hours prior to the first CHG shower.  You may shave your face.  Please follow these instructions carefully:   1.  Shower with CHG Soap the night before surgery and the morning of Surgery.  2.  If you choose to wash your hair, wash your hair first as usual with your normal shampoo.  3.  After you shampoo, rinse your hair and body thoroughly to remove the Shampoo.  4.  Use CHG as you would any other liquid soap.  You can apply chg directly  to the skin and wash gently with scrungie or a clean washcloth.  5.  Apply the CHG Soap to your body ONLY FROM THE NECK DOWN.  Do not use on open wounds or open sores.  Avoid contact with your eyes ears, mouth and genitals (private parts).  Wash genitals (private parts)       with your normal soap.  6.  Wash thoroughly, paying special attention to the area where your surgery will be performed.  7.  Thoroughly rinse your body with warm water from  the neck down.  8.  DO NOT shower/wash with your normal soap after using and rinsing off the CHG Soap.  9.  Pat yourself dry with a clean towel.            10.  Wear clean pajamas.            11.  Place clean sheets on your bed the night of your first shower and do not sleep with pets.  Day of Surgery  Do not apply any lotions/deodorants the morning of surgery.  Please wear clean clothes to the hospital/surgery center.   Please read over the following fact sheets that you were given: Pain Booklet, Coughing and Deep Breathing, Blood Transfusion Information, MRSA Information and Surgical Site Infection Prevention

## 2013-12-04 MED ORDER — DEXAMETHASONE SODIUM PHOSPHATE 10 MG/ML IJ SOLN
10.0000 mg | INTRAMUSCULAR | Status: AC
  Administered 2013-12-05: 10 mg via INTRAVENOUS
  Filled 2013-12-04: qty 1

## 2013-12-04 MED ORDER — CEFAZOLIN SODIUM-DEXTROSE 2-3 GM-% IV SOLR
2.0000 g | INTRAVENOUS | Status: AC
  Administered 2013-12-05: 2 g via INTRAVENOUS
  Filled 2013-12-04: qty 50

## 2013-12-05 ENCOUNTER — Encounter (HOSPITAL_COMMUNITY): Payer: 59 | Admitting: Certified Registered Nurse Anesthetist

## 2013-12-05 ENCOUNTER — Inpatient Hospital Stay (HOSPITAL_COMMUNITY)
Admission: RE | Admit: 2013-12-05 | Discharge: 2013-12-08 | DRG: 460 | Disposition: A | Discharge status: Home / Self Care | Payer: 59 | Source: Ambulatory Visit | Attending: Neurosurgery | Admitting: Neurosurgery

## 2013-12-05 ENCOUNTER — Inpatient Hospital Stay (HOSPITAL_COMMUNITY): Payer: 59

## 2013-12-05 ENCOUNTER — Encounter (HOSPITAL_COMMUNITY): Payer: Self-pay | Admitting: Surgery

## 2013-12-05 ENCOUNTER — Inpatient Hospital Stay (HOSPITAL_COMMUNITY): Payer: 59 | Admitting: Certified Registered Nurse Anesthetist

## 2013-12-05 ENCOUNTER — Encounter (HOSPITAL_COMMUNITY)
Admission: RE | Disposition: A | Discharge status: Home / Self Care | Payer: Self-pay | Source: Ambulatory Visit | Attending: Neurosurgery

## 2013-12-05 DIAGNOSIS — M19019 Primary osteoarthritis, unspecified shoulder: Secondary | ICD-10-CM | POA: Diagnosis present

## 2013-12-05 DIAGNOSIS — K219 Gastro-esophageal reflux disease without esophagitis: Secondary | ICD-10-CM | POA: Diagnosis present

## 2013-12-05 DIAGNOSIS — IMO0002 Reserved for concepts with insufficient information to code with codable children: Secondary | ICD-10-CM | POA: Diagnosis not present

## 2013-12-05 DIAGNOSIS — Z8673 Personal history of transient ischemic attack (TIA), and cerebral infarction without residual deficits: Secondary | ICD-10-CM | POA: Diagnosis not present

## 2013-12-05 DIAGNOSIS — M479 Spondylosis, unspecified: Secondary | ICD-10-CM | POA: Diagnosis present

## 2013-12-05 DIAGNOSIS — M5126 Other intervertebral disc displacement, lumbar region: Principal | ICD-10-CM | POA: Diagnosis present

## 2013-12-05 DIAGNOSIS — Z6833 Body mass index (BMI) 33.0-33.9, adult: Secondary | ICD-10-CM | POA: Diagnosis not present

## 2013-12-05 DIAGNOSIS — Z801 Family history of malignant neoplasm of trachea, bronchus and lung: Secondary | ICD-10-CM | POA: Diagnosis not present

## 2013-12-05 DIAGNOSIS — I10 Essential (primary) hypertension: Secondary | ICD-10-CM | POA: Diagnosis present

## 2013-12-05 DIAGNOSIS — Z888 Allergy status to other drugs, medicaments and biological substances status: Secondary | ICD-10-CM

## 2013-12-05 DIAGNOSIS — F411 Generalized anxiety disorder: Secondary | ICD-10-CM | POA: Diagnosis present

## 2013-12-05 DIAGNOSIS — Z8249 Family history of ischemic heart disease and other diseases of the circulatory system: Secondary | ICD-10-CM

## 2013-12-05 DIAGNOSIS — M79609 Pain in unspecified limb: Secondary | ICD-10-CM | POA: Diagnosis present

## 2013-12-05 DIAGNOSIS — Z885 Allergy status to narcotic agent status: Secondary | ICD-10-CM | POA: Diagnosis not present

## 2013-12-05 HISTORY — PX: MAXIMUM ACCESS (MAS)POSTERIOR LUMBAR INTERBODY FUSION (PLIF) 1 LEVEL: SHX6368

## 2013-12-05 LAB — POTASSIUM: Potassium: 3 mEq/L — ABNORMAL LOW (ref 3.7–5.3)

## 2013-12-05 LAB — HEMOGLOBIN AND HEMATOCRIT, BLOOD
HEMATOCRIT: 27.1 % — AB (ref 36.0–46.0)
HEMOGLOBIN: 8.9 g/dL — AB (ref 12.0–15.0)

## 2013-12-05 SURGERY — FOR MAXIMUM ACCESS (MAS) POSTERIOR LUMBAR INTERBODY FUSION (PLIF) 1 LEVEL
Anesthesia: General | Site: Spine Lumbar

## 2013-12-05 MED ORDER — ONDANSETRON HCL 4 MG/2ML IJ SOLN
INTRAMUSCULAR | Status: AC
  Filled 2013-12-05: qty 2

## 2013-12-05 MED ORDER — ARTIFICIAL TEARS OP OINT
TOPICAL_OINTMENT | OPHTHALMIC | Status: DC | PRN
  Administered 2013-12-05: 1 via OPHTHALMIC

## 2013-12-05 MED ORDER — EPHEDRINE SULFATE 50 MG/ML IJ SOLN
INTRAMUSCULAR | Status: AC
  Filled 2013-12-05: qty 1

## 2013-12-05 MED ORDER — LORATADINE 10 MG PO TABS
10.0000 mg | ORAL_TABLET | Freq: Every day | ORAL | Status: DC
  Administered 2013-12-05 – 2013-12-08 (×4): 10 mg via ORAL
  Filled 2013-12-05 (×4): qty 1

## 2013-12-05 MED ORDER — CEFAZOLIN SODIUM 1-5 GM-% IV SOLN
1.0000 g | Freq: Three times a day (TID) | INTRAVENOUS | Status: AC
  Administered 2013-12-05 – 2013-12-08 (×9): 1 g via INTRAVENOUS
  Filled 2013-12-05 (×11): qty 50

## 2013-12-05 MED ORDER — ALBUMIN HUMAN 5 % IV SOLN
INTRAVENOUS | Status: DC | PRN
  Administered 2013-12-05: 09:00:00 via INTRAVENOUS

## 2013-12-05 MED ORDER — PREDNISONE 5 MG PO TABS
10.0000 mg | ORAL_TABLET | Freq: Every day | ORAL | Status: DC
  Administered 2013-12-05 – 2013-12-08 (×4): 10 mg via ORAL
  Filled 2013-12-05 (×4): qty 2

## 2013-12-05 MED ORDER — NEOSTIGMINE METHYLSULFATE 10 MG/10ML IV SOLN
INTRAVENOUS | Status: DC | PRN
  Administered 2013-12-05: 4 mg via INTRAVENOUS

## 2013-12-05 MED ORDER — ACETAMINOPHEN 325 MG PO TABS
650.0000 mg | ORAL_TABLET | ORAL | Status: DC | PRN
  Administered 2013-12-08: 650 mg via ORAL
  Filled 2013-12-05: qty 2

## 2013-12-05 MED ORDER — VECURONIUM BROMIDE 10 MG IV SOLR
INTRAVENOUS | Status: DC | PRN
  Administered 2013-12-05: 1 mg via INTRAVENOUS
  Administered 2013-12-05: 2 mg via INTRAVENOUS
  Administered 2013-12-05: 1 mg via INTRAVENOUS

## 2013-12-05 MED ORDER — MENTHOL 3 MG MT LOZG: 1.0000 | LOZENGE | OROMUCOSAL | Status: DC | PRN

## 2013-12-05 MED ORDER — MIDAZOLAM HCL 2 MG/2ML IJ SOLN
INTRAMUSCULAR | Status: AC
  Filled 2013-12-05: qty 2

## 2013-12-05 MED ORDER — ONDANSETRON HCL 4 MG/2ML IJ SOLN
4.0000 mg | INTRAMUSCULAR | Status: DC | PRN
  Administered 2013-12-05: 4 mg via INTRAVENOUS
  Filled 2013-12-05: qty 2

## 2013-12-05 MED ORDER — SCOPOLAMINE 1 MG/3DAYS TD PT72
MEDICATED_PATCH | TRANSDERMAL | Status: AC
  Filled 2013-12-05: qty 1

## 2013-12-05 MED ORDER — PROPOFOL 10 MG/ML IV BOLUS
INTRAVENOUS | Status: DC | PRN
  Administered 2013-12-05: 150 mg via INTRAVENOUS

## 2013-12-05 MED ORDER — SODIUM CHLORIDE 0.9 % IJ SOLN: 3.0000 mL | INTRAMUSCULAR | Status: DC | PRN

## 2013-12-05 MED ORDER — SUCRALFATE 1 G PO TABS: 1.0000 g | ORAL_TABLET | Freq: Two times a day (BID) | ORAL | Status: DC | PRN

## 2013-12-05 MED ORDER — ARTIFICIAL TEARS OP OINT
TOPICAL_OINTMENT | OPHTHALMIC | Status: AC
  Filled 2013-12-05: qty 3.5

## 2013-12-05 MED ORDER — ALPRAZOLAM 0.5 MG PO TABS
0.5000 mg | ORAL_TABLET | Freq: Every day | ORAL | Status: DC
  Administered 2013-12-05 – 2013-12-07 (×3): 1 mg via ORAL
  Filled 2013-12-05 (×3): qty 2

## 2013-12-05 MED ORDER — CYCLOBENZAPRINE HCL 10 MG PO TABS
10.0000 mg | ORAL_TABLET | Freq: Three times a day (TID) | ORAL | Status: DC | PRN
  Administered 2013-12-06: 10 mg via ORAL
  Filled 2013-12-05: qty 1

## 2013-12-05 MED ORDER — NEOSTIGMINE METHYLSULFATE 10 MG/10ML IV SOLN
INTRAVENOUS | Status: AC
  Filled 2013-12-05: qty 1

## 2013-12-05 MED ORDER — OLOPATADINE HCL 0.6 % NA SOLN: 1.0000 | Freq: Every day | NASAL | Status: DC

## 2013-12-05 MED ORDER — CYCLOBENZAPRINE HCL 10 MG PO TABS
10.0000 mg | ORAL_TABLET | Freq: Three times a day (TID) | ORAL | Status: DC | PRN
  Administered 2013-12-05: 10 mg via ORAL

## 2013-12-05 MED ORDER — LIDOCAINE HCL (CARDIAC) 20 MG/ML IV SOLN
INTRAVENOUS | Status: DC | PRN
  Administered 2013-12-05: 80 mg via INTRAVENOUS

## 2013-12-05 MED ORDER — OXYCODONE HCL 5 MG/5ML PO SOLN: 5.0000 mg | Freq: Once | ORAL | Status: DC | PRN

## 2013-12-05 MED ORDER — PANTOPRAZOLE SODIUM 40 MG PO TBEC
40.0000 mg | DELAYED_RELEASE_TABLET | Freq: Every day | ORAL | Status: DC
  Administered 2013-12-06 – 2013-12-08 (×3): 40 mg via ORAL
  Filled 2013-12-05 (×3): qty 1

## 2013-12-05 MED ORDER — HYDROMORPHONE HCL 1 MG/ML IJ SOLN
0.2500 mg | INTRAMUSCULAR | Status: DC | PRN
  Administered 2013-12-05 (×4): 0.5 mg via INTRAVENOUS

## 2013-12-05 MED ORDER — LACTATED RINGERS IV SOLN
INTRAVENOUS | Status: DC | PRN
  Administered 2013-12-05 (×3): via INTRAVENOUS

## 2013-12-05 MED ORDER — TRAMADOL HCL 50 MG PO TABS: 50.0000 mg | ORAL_TABLET | Freq: Four times a day (QID) | ORAL | Status: DC | PRN

## 2013-12-05 MED ORDER — GLYCOPYRROLATE 0.2 MG/ML IJ SOLN
INTRAMUSCULAR | Status: DC | PRN
  Administered 2013-12-05: 0.6 mg via INTRAVENOUS

## 2013-12-05 MED ORDER — THROMBIN 20000 UNITS EX SOLR
CUTANEOUS | Status: DC | PRN
  Administered 2013-12-05 (×2): via TOPICAL

## 2013-12-05 MED ORDER — FENTANYL CITRATE 0.05 MG/ML IJ SOLN
INTRAMUSCULAR | Status: DC | PRN
  Administered 2013-12-05 (×5): 50 ug via INTRAVENOUS
  Administered 2013-12-05: 100 ug via INTRAVENOUS
  Administered 2013-12-05: 50 ug via INTRAVENOUS

## 2013-12-05 MED ORDER — CYCLOBENZAPRINE HCL 10 MG PO TABS
ORAL_TABLET | ORAL | Status: AC
  Filled 2013-12-05: qty 1

## 2013-12-05 MED ORDER — VECURONIUM BROMIDE 10 MG IV SOLR
INTRAVENOUS | Status: AC
  Filled 2013-12-05: qty 10

## 2013-12-05 MED ORDER — MIDAZOLAM HCL 5 MG/5ML IJ SOLN
INTRAMUSCULAR | Status: DC | PRN
  Administered 2013-12-05: 2 mg via INTRAVENOUS

## 2013-12-05 MED ORDER — ATORVASTATIN CALCIUM 10 MG PO TABS
20.0000 mg | ORAL_TABLET | Freq: Every morning | ORAL | Status: DC
  Administered 2013-12-05 – 2013-12-08 (×4): 20 mg via ORAL
  Filled 2013-12-05 (×4): qty 2

## 2013-12-05 MED ORDER — SODIUM CHLORIDE 0.9 % IV SOLN: 250.0000 mL | INTRAVENOUS | Status: DC

## 2013-12-05 MED ORDER — FENTANYL CITRATE 0.05 MG/ML IJ SOLN
INTRAMUSCULAR | Status: AC
  Filled 2013-12-05: qty 5

## 2013-12-05 MED ORDER — ROCURONIUM BROMIDE 50 MG/5ML IV SOLN
INTRAVENOUS | Status: AC
  Filled 2013-12-05: qty 1

## 2013-12-05 MED ORDER — SODIUM CHLORIDE 0.9 % IR SOLN
Status: DC | PRN
  Administered 2013-12-05: 08:00:00

## 2013-12-05 MED ORDER — BUPIVACAINE HCL (PF) 0.25 % IJ SOLN
INTRAMUSCULAR | Status: DC | PRN
  Administered 2013-12-05: 10 mL

## 2013-12-05 MED ORDER — POTASSIUM CHLORIDE IN NACL 20-0.9 MEQ/L-% IV SOLN
INTRAVENOUS | Status: DC
  Administered 2013-12-05 – 2013-12-06 (×2): via INTRAVENOUS
  Administered 2013-12-07: 75 mL/h via INTRAVENOUS
  Filled 2013-12-05 (×3): qty 1000

## 2013-12-05 MED ORDER — PROPOFOL 10 MG/ML IV BOLUS
INTRAVENOUS | Status: AC
  Filled 2013-12-05: qty 20

## 2013-12-05 MED ORDER — SODIUM CHLORIDE 0.9 % IJ SOLN: 3.0000 mL | Freq: Two times a day (BID) | INTRAMUSCULAR | Status: DC

## 2013-12-05 MED ORDER — ACETAMINOPHEN 650 MG RE SUPP: 650.0000 mg | RECTAL | Status: DC | PRN

## 2013-12-05 MED ORDER — LIDOCAINE-EPINEPHRINE 1 %-1:100000 IJ SOLN
INTRAMUSCULAR | Status: DC | PRN
  Administered 2013-12-05: 10 mL

## 2013-12-05 MED ORDER — ONDANSETRON HCL 4 MG/2ML IJ SOLN
INTRAMUSCULAR | Status: DC | PRN
  Administered 2013-12-05: 4 mg via INTRAVENOUS

## 2013-12-05 MED ORDER — GLYCOPYRROLATE 0.2 MG/ML IJ SOLN
INTRAMUSCULAR | Status: AC
  Filled 2013-12-05: qty 3

## 2013-12-05 MED ORDER — OXYCODONE HCL 5 MG PO TABS: 5.0000 mg | ORAL_TABLET | Freq: Once | ORAL | Status: DC | PRN

## 2013-12-05 MED ORDER — LIDOCAINE HCL (CARDIAC) 20 MG/ML IV SOLN
INTRAVENOUS | Status: AC
  Filled 2013-12-05: qty 5

## 2013-12-05 MED ORDER — NEOSTIGMINE METHYLSULFATE 10 MG/10ML IV SOLN
INTRAVENOUS | Status: AC
  Filled 2013-12-05: qty 2

## 2013-12-05 MED ORDER — OXYCODONE-ACETAMINOPHEN 5-325 MG PO TABS
1.0000 | ORAL_TABLET | ORAL | Status: DC | PRN
  Administered 2013-12-05 – 2013-12-06 (×5): 2 via ORAL
  Administered 2013-12-06: 1 via ORAL
  Administered 2013-12-06 – 2013-12-08 (×6): 2 via ORAL
  Filled 2013-12-05: qty 2
  Filled 2013-12-05: qty 1
  Filled 2013-12-05 (×11): qty 2

## 2013-12-05 MED ORDER — PHENOL 1.4 % MT LIQD: 1.0000 | OROMUCOSAL | Status: DC | PRN

## 2013-12-05 MED ORDER — THROMBIN 5000 UNITS EX SOLR: OROMUCOSAL | Status: DC | PRN

## 2013-12-05 MED ORDER — STERILE WATER FOR INJECTION IJ SOLN
INTRAMUSCULAR | Status: AC
  Filled 2013-12-05: qty 10

## 2013-12-05 MED ORDER — SCOPOLAMINE 1 MG/3DAYS TD PT72
MEDICATED_PATCH | TRANSDERMAL | Status: DC | PRN
  Administered 2013-12-05: 1 via TRANSDERMAL

## 2013-12-05 MED ORDER — HYDROMORPHONE HCL 1 MG/ML IJ SOLN
INTRAMUSCULAR | Status: AC
  Administered 2013-12-05: 0.5 mg via INTRAVENOUS
  Filled 2013-12-05: qty 1

## 2013-12-05 MED ORDER — ROCURONIUM BROMIDE 100 MG/10ML IV SOLN
INTRAVENOUS | Status: DC | PRN
  Administered 2013-12-05: 10 mg via INTRAVENOUS
  Administered 2013-12-05: 40 mg via INTRAVENOUS

## 2013-12-05 MED ORDER — PROMETHAZINE HCL 25 MG/ML IJ SOLN: 6.2500 mg | INTRAMUSCULAR | Status: DC | PRN

## 2013-12-05 MED ORDER — OXYCODONE-ACETAMINOPHEN 5-325 MG PO TABS: 1.0000 | ORAL_TABLET | ORAL | Status: DC | PRN

## 2013-12-05 MED ORDER — 0.9 % SODIUM CHLORIDE (POUR BTL) OPTIME
TOPICAL | Status: DC | PRN
  Administered 2013-12-05: 1000 mL

## 2013-12-05 MED ORDER — MONTELUKAST SODIUM 10 MG PO TABS
10.0000 mg | ORAL_TABLET | Freq: Every evening | ORAL | Status: DC
  Administered 2013-12-05 – 2013-12-07 (×3): 10 mg via ORAL
  Filled 2013-12-05 (×3): qty 1

## 2013-12-05 MED ORDER — HYDROCHLOROTHIAZIDE 25 MG PO TABS
25.0000 mg | ORAL_TABLET | Freq: Every morning | ORAL | Status: DC
  Administered 2013-12-05 – 2013-12-08 (×4): 25 mg via ORAL
  Filled 2013-12-05 (×4): qty 1

## 2013-12-05 MED ORDER — HYDROMORPHONE HCL 1 MG/ML IJ SOLN
0.5000 mg | INTRAMUSCULAR | Status: DC | PRN
  Administered 2013-12-05: 1 mg via INTRAVENOUS
  Filled 2013-12-05: qty 1

## 2013-12-05 MED ORDER — LEFLUNOMIDE 20 MG PO TABS
20.0000 mg | ORAL_TABLET | Freq: Every day | ORAL | Status: DC
  Administered 2013-12-05 – 2013-12-08 (×3): 20 mg via ORAL
  Filled 2013-12-05 (×4): qty 1

## 2013-12-05 SURGICAL SUPPLY — 76 items
ADH SKN CLS LQ APL DERMABOND (GAUZE/BANDAGES/DRESSINGS) ×1
APL SKNCLS STERI-STRIP NONHPOA (GAUZE/BANDAGES/DRESSINGS) ×1
BAG DECANTER FOR FLEXI CONT (MISCELLANEOUS) ×3 IMPLANT
BENZOIN TINCTURE PRP APPL 2/3 (GAUZE/BANDAGES/DRESSINGS) ×3 IMPLANT
BLADE SURG 11 STRL SS (BLADE) ×3 IMPLANT
BLADE SURG ROTATE 9660 (MISCELLANEOUS) IMPLANT
BONE MATRIX OSTEOCEL PRO MED (Bone Implant) ×2 IMPLANT
BUR CUTTER 7.0 ROUND (BURR) ×2 IMPLANT
BUR MATCHSTICK NEURO 3.0 LAGG (BURR) ×3 IMPLANT
CAGE COROENT 10X9X28-8 LUMBAR (Cage) ×4 IMPLANT
CANISTER SUCT 3000ML (MISCELLANEOUS) ×3 IMPLANT
CLOSURE WOUND 1/2 X4 (GAUZE/BANDAGES/DRESSINGS) ×2
CONT SPEC 4OZ CLIKSEAL STRL BL (MISCELLANEOUS) ×6 IMPLANT
COVER BACK TABLE 24X17X13 BIG (DRAPES) IMPLANT
COVER TABLE BACK 60X90 (DRAPES) ×3 IMPLANT
DERMABOND ADHESIVE PROPEN (GAUZE/BANDAGES/DRESSINGS) ×2
DERMABOND ADVANCED .7 DNX6 (GAUZE/BANDAGES/DRESSINGS) IMPLANT
DRAPE C-ARM 42X72 X-RAY (DRAPES) ×3 IMPLANT
DRAPE C-ARMOR (DRAPES) ×3 IMPLANT
DRAPE LAPAROTOMY 100X72X124 (DRAPES) ×3 IMPLANT
DRAPE POUCH INSTRU U-SHP 10X18 (DRAPES) ×3 IMPLANT
DRAPE SURG 17X23 STRL (DRAPES) ×3 IMPLANT
DRSG OPSITE 4X5.5 SM (GAUZE/BANDAGES/DRESSINGS) ×4 IMPLANT
DRSG OPSITE POSTOP 4X6 (GAUZE/BANDAGES/DRESSINGS) ×2 IMPLANT
DRSG TELFA 3X8 NADH (GAUZE/BANDAGES/DRESSINGS) IMPLANT
DURAPREP 26ML APPLICATOR (WOUND CARE) ×3 IMPLANT
ELECT REM PT RETURN 9FT ADLT (ELECTROSURGICAL) ×3
ELECTRODE REM PT RTRN 9FT ADLT (ELECTROSURGICAL) ×1 IMPLANT
EVACUATOR 1/8 PVC DRAIN (DRAIN) ×3 IMPLANT
GAUZE SPONGE 4X4 12PLY STRL (GAUZE/BANDAGES/DRESSINGS) ×2 IMPLANT
GAUZE SPONGE 4X4 16PLY XRAY LF (GAUZE/BANDAGES/DRESSINGS) ×2 IMPLANT
GLOVE BIO SURGEON STRL SZ8 (GLOVE) ×3 IMPLANT
GLOVE BIO SURGEON STRL SZ8.5 (GLOVE) ×2 IMPLANT
GLOVE BIOGEL PI IND STRL 7.5 (GLOVE) IMPLANT
GLOVE BIOGEL PI INDICATOR 7.5 (GLOVE) ×2
GLOVE INDICATOR 8.5 STRL (GLOVE) ×3 IMPLANT
GLOVE SS BIOGEL STRL SZ 8 (GLOVE) IMPLANT
GLOVE SUPERSENSE BIOGEL SZ 8 (GLOVE) ×2
GLOVE SURG SS PI 7.0 STRL IVOR (GLOVE) ×4 IMPLANT
GOWN STRL REUS W/ TWL LRG LVL3 (GOWN DISPOSABLE) IMPLANT
GOWN STRL REUS W/ TWL XL LVL3 (GOWN DISPOSABLE) ×2 IMPLANT
GOWN STRL REUS W/TWL 2XL LVL3 (GOWN DISPOSABLE) IMPLANT
GOWN STRL REUS W/TWL LRG LVL3 (GOWN DISPOSABLE) ×3
GOWN STRL REUS W/TWL XL LVL3 (GOWN DISPOSABLE) ×6
HEMOSTAT POWDER KIT SURGIFOAM (HEMOSTASIS) IMPLANT
KIT BASIN OR (CUSTOM PROCEDURE TRAY) ×3 IMPLANT
KIT ROOM TURNOVER OR (KITS) ×3 IMPLANT
MILL MEDIUM DISP (BLADE) ×2 IMPLANT
NDL HYPO 25X1 1.5 SAFETY (NEEDLE) ×1 IMPLANT
NEEDLE HYPO 25X1 1.5 SAFETY (NEEDLE) ×3 IMPLANT
NS IRRIG 1000ML POUR BTL (IV SOLUTION) ×3 IMPLANT
PACK LAMINECTOMY NEURO (CUSTOM PROCEDURE TRAY) ×3 IMPLANT
PAD ARMBOARD 7.5X6 YLW CONV (MISCELLANEOUS) ×13 IMPLANT
PAD DRESSING TELFA 3X8 NADH (GAUZE/BANDAGES/DRESSINGS) ×1 IMPLANT
ROD ARM15T 45MM (Rod) ×4 IMPLANT
SCREW LOCK (Screw) ×6 IMPLANT
SCREW LOCK FXNS SPNE MAS PL (Screw) IMPLANT
SCREW POLYAXIAL 6.5X50MM (Screw) ×4 IMPLANT
SCREW PRECEPT SET (Screw) ×4 IMPLANT
SCREW SHANK 6.5X65 (Screw) ×4 IMPLANT
SCREW TULIP 5.5 (Screw) ×4 IMPLANT
SPONGE LAP 4X18 X RAY DECT (DISPOSABLE) IMPLANT
SPONGE SURGIFOAM ABS GEL 100 (HEMOSTASIS) ×5 IMPLANT
STRIP CLOSURE SKIN 1/2X4 (GAUZE/BANDAGES/DRESSINGS) ×4 IMPLANT
SUT VIC AB 0 CT1 18XCR BRD8 (SUTURE) ×1 IMPLANT
SUT VIC AB 0 CT1 8-18 (SUTURE) ×3
SUT VIC AB 2-0 CT1 18 (SUTURE) ×3 IMPLANT
SUT VIC AB 2-0 CT1 27 (SUTURE) ×3
SUT VIC AB 2-0 CT1 TAPERPNT 27 (SUTURE) IMPLANT
SUT VICRYL 4-0 PS2 18IN ABS (SUTURE) ×3 IMPLANT
SYR 20ML ECCENTRIC (SYRINGE) ×3 IMPLANT
THROMBI 5000 UNITS IMPLANT
TOWEL OR 17X24 6PK STRL BLUE (TOWEL DISPOSABLE) ×3 IMPLANT
TOWEL OR 17X26 10 PK STRL BLUE (TOWEL DISPOSABLE) ×3 IMPLANT
TRAY FOLEY CATH 14FRSI W/METER (CATHETERS) ×3 IMPLANT
WATER STERILE IRR 1000ML POUR (IV SOLUTION) ×3 IMPLANT

## 2013-12-05 NOTE — Op Note (Signed)
Preoperative diagnosis: Herniated nucleus pulposus L5-S1 with lumbar spinal stenosis L5-S1 and right greater left L5 and S1 radiculopathy  Postoperative diagnosis: Same  Procedure: Decompressive lumbar laminectomy L5-S1 complete medial facetectomies radical foraminotomies in excess and requiring more work than would be needed with a standard interbody fusion  #2 posterior lumbar interbody fusion using peek cages from the base of packed with local autograft mixed with osteocel pro  #3 pedicle and cortical screw fixation L5-S1 using the invasive cortical screw set  Placement of a large Hemovac drain  Surgeon: Dominica Severin Quetzal Meany  Assistant: Newman Pies  Anesthesia: Gen.  EBL: 1 L  History of present illness: Patient is a very pleasant 63 year old female who has had progressive worsening back and, a right leg pain over last several weeks and months workup revealed a very large disc herniation with a free fragment migrating cephalad behind the L5 vertebral body causing severe thecal sac compression spinal stenosis and by foraminal stenosis at L5-S1 nerve roots. Due to the failure conservative treatment imaging findings and progression clinical syndrome I recommended decompression and stabilization procedure at L5-S1. I extensively reviewed the risks and benefits of the operation the patient as well as perioperative course and expectations of outcome and alternatives of surgery and she understood and agreed to proceed forward.  Operative procedure: Patient brought into the or was induced under general anesthesia positioned holes in her back was prepped and draped in routine sterile fashion a midline incision was made centering over the L4-5 and L5-S1 the spaces subperiosteal dissection was carried out on the lamina of L5 and S1 the previous pertaining screws then exposed from L4-5 and interoperative x-ray confirmed the patient proper level so central laminectomy was performed complete medial facetectomies  radical foraminotomies were performed at L5 and S1 nerve roots. There is marked osseous prominence of the undersurface the L5 nerve root due to facet arthropathy and overgrowth of the superior tickling facet. This is all been away decompress the distal foramen at L5 as well as opening of the foramen at S1. He dislocation identified itself present itself central and lateral to the canal the right causing severe compression of the S1 and L5 nerve roots. Several large fibers removed suture rongeurs the nerve root. Then the disc space was incised bilaterally distractors were inserted and then using skull retractors panel shavers endplates were prepared discectomy was performed. Due to distractor sizing I selected a 10 mm tall 28 mm long and lordotic cage calyx and a 14 mm anteriorly packed this with local autograft mixed with osteocel pro and both implants were inserted a copious amount of autograft mixed with morselized was inserted centrally at this point the fusion L4-5 inspected and felt to be solid and the knots remove the rods removed the screws removed the L5 screws were placed with slightly longer 6 5 x 50 screws and cortical screws at the fluoroscopy simple the way confirm good position of all the implants then 2 cortical screws were placed at S1 these are 6 5 x 35 posterior fluoroscopy confirmed good position of all implants then were to see her good fixing space was maintained the rods were then sized selected inserted top tightness tightened down L5-S1 foraminal reinspected to confirm patency admission graft material Gelfoam was laid up the dura a drain was placed and then the wounds closed in layers with interrupted Vicryl and skin is closed running 4 subcuticular benzoin and Steri-Strips were applied patient recovered in stable condition. At the end of case on it  counts sponge counts were correct.

## 2013-12-05 NOTE — Transfer of Care (Signed)
Immediate Anesthesia Transfer of Care Note  Patient: Angie Olson  Procedure(s) Performed: Procedure(s): FOR MAXIMUM ACCESS (MAS) POSTERIOR LUMBAR INTERBODY FUSION (PLIF)LUMBAR FIVE-SACRAL-ONE,REMOVAL HARDWARE LUMBAR FOUR-FIVE (N/A)  Patient Location: PACU  Anesthesia Type:General  Level of Consciousness: awake and alert   Airway & Oxygen Therapy: Patient Spontanous Breathing and Patient connected to nasal cannula oxygen  Post-op Assessment: Report given to PACU RN, Post -op Vital signs reviewed and stable and Patient moving all extremities X 4  Post vital signs: Reviewed and stable  Complications: No apparent anesthesia complications

## 2013-12-05 NOTE — H&P (Signed)
Angie Olson is an 63 y.o. female.   Chief Complaint: Back and right leg pain HPI: Patient is a very pleasant 63 year old female whose had long-standing history with her back she had a previous L4-5 fusion and presents with new onset back and right leg pain. Workup revealed a large disc herniation L5-S1 with a free fragment migrating cephalad behind the L5 vertebral body. Due to her failure conservative treatment imaging findings progression of clinical syndrome I recommended decompression and extension of her fusion L5-S1. I extensively reviewed the risks and benefits of the operation the patient as well as perioperative course and expectations of outcome and alternatives of surgery she understands and agrees to proceed forward.  Past Medical History  Diagnosis Date  . Hypertension   . Acid reflux   . Bronchitis     CHRONIC   . Anxiety     PT'S SON DIED 30-Jun-2010  . Pneumonia     X 3  - LAST TIME WAS Jun 30, 2011  . H/O hiatal hernia   . Arthritis     RA AND OA  . Pain     RIGHT KNEE - TORN MENISCUS AND ACL  . Stroke 03/2011    TIA--PT EXPERIENCED NUMBNESS RT HAND AND ARM , HEADACHE AND NAUSEA. NO RESIDUAL PROBLEMS  . Borderline hypercholesterolemia     on medication for TIA  . Environmental allergies   . PONV (postoperative nausea and vomiting) 09-2012    sore throat after ankle surgery 6 yrs ago and severe ponv 09-2012  . Allergic rhinitis   . GERD (gastroesophageal reflux disease)     on PPI and carafte prn  . IBS (irritable bowel syndrome)   . LBP (low back pain)   . Pulmonary nodule seen on imaging study   . OA (osteoarthritis)     Left Shoulder  . Colon polyp   . Shortness of breath     with activity,bronchitis    Past Surgical History  Procedure Laterality Date  . Ankle surgery  ~10 years ago    left  . Shoulder surgery Left ~14 years ago    "scope"  . Thumb surgery Left ~14 years ago    x2  . Colonoscopy  [id number], 06-30-11  . Trigger finger release  ~6 years ago    X2 ON  RIGHT HAND  . Knee arthroscopy Right 10/13/2012    Procedure: RIGHT KNEE ARTHROSCOPY WITH DEBRIDEMENT, CHONDROPLASTY;  Surgeon: Gearlean Alf, MD;  Location: [facility];  Service: Orthopedics;  Laterality: Right;  . Back surgery  06/29/2009    lower  . Knee arthroscopy Right 03/02/2013    Procedure: RIGHT ARTHROSCOPY KNEE WITH MEDIAL MENISCAL  DEBRIDEMENT;  Surgeon: Gearlean Alf, MD;  Location: [facility];  Service: Orthopedics;  Laterality: Right;  . Cholecystectomy  ~15 years ago  . Root canal    . Tubal ligation    . Total knee arthroplasty Right 07/25/2013    Procedure: RIGHT TOTAL KNEE ARTHROPLASTY;  Surgeon: Gearlean Alf, MD;  Location: [facility];  Service: Orthopedics;  Laterality: Right;    Family History  Problem Relation Age of Onset  . Colon cancer Neg Hx   . Heart disease Sister   . Lung cancer Brother   . Heart disease Brother   . CAD Brother   . Heart attack Mother   . Emphysema Father   . Hypertension Daughter    Social History:  reports that she has never smoked. She has never used smokeless tobacco. She  reports that she drinks alcohol. She reports that she does not use illicit drugs.  Allergies:  Allergies  Allergen Reactions  . Methotrexate Derivatives     Recurrent infection  . Pseudoephedrine     Makes jittery   . Vicodin [Hydrocodone-Acetaminophen]     "Makes me feel weird"    Medications Prior to Admission  Medication Sig Dispense Refill  . ALPRAZolam (XANAX) 1 MG tablet Take 0.5-1 mg by mouth at bedtime.       Marland Kitchen atorvastatin (LIPITOR) 20 MG tablet Take 20 mg by mouth every morning.       . cetirizine (ZYRTEC) 10 MG tablet Take 10 mg by mouth daily.      . cyclobenzaprine (FLEXERIL) 10 MG tablet Take 10 mg by mouth 3 (three) times daily as needed for muscle spasms.      . diclofenac sodium (VOLTAREN) 1 % GEL Apply 2 g topically 4 (four) times daily as needed.       Marland Kitchen esomeprazole (NEXIUM) 40 MG capsule Take 40 mg by mouth daily before breakfast.      .  hydrochlorothiazide (HYDRODIURIL) 25 MG tablet Take 25 mg by mouth every morning.       . leflunomide (ARAVA) 20 MG tablet Take 20 mg by mouth daily.      . montelukast (SINGULAIR) 10 MG tablet Take 10 mg by mouth every evening.      . Olopatadine HCl (PATANASE) 0.6 % SOLN Place 1 puff into the nose daily.      Marland Kitchen oxyCODONE-acetaminophen (PERCOCET/ROXICET) 5-325 MG per tablet Take 1 tablet by mouth every 4 (four) hours as needed for moderate pain or severe pain.      . predniSONE (DELTASONE) 5 MG tablet Take 5-10 mg by mouth daily. Will take 1-2 tablets at a time depending on inflammation      . sucralfate (CARAFATE) 1 G tablet Take 1 g by mouth 2 (two) times daily as needed (for indigestion).      . traMADol (ULTRAM) 50 MG tablet Take 1 tablet (50 mg total) by mouth every 6 (six) hours as needed (mild to moderate pain).  60 tablet  1    No results found for this or any previous visit (from the past 48 hour(s)). No results found.  Review of Systems  Constitutional: Negative.   HENT: Negative.   Eyes: Negative.   Respiratory: Negative.   Cardiovascular: Negative.   Gastrointestinal: Negative.   Genitourinary: Negative.   Musculoskeletal: Positive for back pain and myalgias.  Skin: Negative.   Neurological: Negative.   Endo/Heme/Allergies: Negative.   Psychiatric/Behavioral: Negative.     Blood pressure 170/96, pulse 94, temperature 97.7 F (36.5 C), temperature source Oral, resp. rate 18, SpO2 98.00%. Physical Exam  Constitutional: She is oriented to person, place, and time. She appears well-developed and well-nourished.  HENT:  Head: Normocephalic.  Eyes: Pupils are equal, round, and reactive to light.  Neck: Normal range of motion.  Cardiovascular: Normal rate.   Respiratory: Effort normal.  GI: Soft.  Neurological: She is alert and oriented to person, place, and time. She has normal strength. GCS eye subscore is 4. GCS verbal subscore is 5. GCS motor subscore is 6.  Reflex  Scores:      Tricep reflexes are 2+ on the right side and 2+ on the left side.      Bicep reflexes are 2+ on the right side and 2+ on the left side.      Brachioradialis reflexes are 2+  on the right side and 2+ on the left side.      Patellar reflexes are 2+ on the right side and 2+ on the left side.      Achilles reflexes are 0 on the right side and 0 on the left side. Patient has 5 out of 5 strength in her iliopsoas, quads, hamstrings, gastrocs, into tibialis, and EHL.  Skin: Skin is warm and dry.     Assessment/Plan 63 year old female presents for decompression stabilization procedure at L5-S1  Tyrail Grandfield P 12/05/2013, 7:09 AM

## 2013-12-05 NOTE — Anesthesia Procedure Notes (Signed)
Procedure Name: Intubation Date/Time: 12/05/2013 7:40 AM Performed by: Jacob Moores Pre-anesthesia Checklist: Patient identified, Emergency Drugs available, Suction available and Patient being monitored Patient Re-evaluated:Patient Re-evaluated prior to inductionOxygen Delivery Method: Circle system utilized Preoxygenation: Pre-oxygenation with 100% oxygen Intubation Type: IV induction Ventilation: Mask ventilation without difficulty Laryngoscope Size: Miller and 2 Grade View: Grade I Tube type: Oral Tube size: 7.5 mm Number of attempts: 1 Airway Equipment and Method: Stylet Placement Confirmation: ETT inserted through vocal cords under direct vision,  positive ETCO2 and breath sounds checked- equal and bilateral Secured at: 21 cm Tube secured with: Tape Dental Injury: Teeth and Oropharynx as per pre-operative assessment

## 2013-12-05 NOTE — Anesthesia Postprocedure Evaluation (Signed)
Anesthesia Post Note  Patient: Angie Olson  Procedure(s) Performed: Procedure(s) (LRB): FOR MAXIMUM ACCESS (MAS) POSTERIOR LUMBAR INTERBODY FUSION (PLIF)LUMBAR FIVE-SACRAL-ONE,REMOVAL HARDWARE LUMBAR FOUR-FIVE (N/A)  Anesthesia type: General  Patient location: PACU  Post pain: Pain level controlled and Adequate analgesia  Post assessment: Post-op Vital signs reviewed, Patient's Cardiovascular Status Stable, Respiratory Function Stable, Patent Airway and Pain level controlled  Last Vitals:  Filed Vitals:   12/05/13 1245  BP: 117/83  Pulse: 94  Temp:   Resp: 19    Post vital signs: Reviewed and stable  Level of consciousness: awake, alert  and oriented  Complications: No apparent anesthesia complications

## 2013-12-05 NOTE — Anesthesia Preprocedure Evaluation (Addendum)
Anesthesia Evaluation  Patient identified by MRN, date of birth, ID band Patient awake    Reviewed: Allergy & Precautions, H&P , NPO status , Patient's Chart, lab work & pertinent test results  History of Anesthesia Complications (+) PONV and history of anesthetic complications  Airway Mallampati: III TM Distance: >3 FB Neck ROM: Full    Dental  (+) Dental Advisory Given, Teeth Intact   Pulmonary shortness of breath and with exertion,  breath sounds clear to auscultation        Cardiovascular hypertension, Pt. on medications + DOE Rhythm:Regular Rate:Normal  06/2013 Nuclear stress test negative for inducible ischemia. TTE: EF 55-60%. Valves normal.   Neuro/Psych PSYCHIATRIC DISORDERS Anxiety TIANo Residual Symptoms    GI/Hepatic hiatal hernia, GERD-  Medicated,  Endo/Other  Morbid obesity  Renal/GU      Musculoskeletal  (+) Arthritis -,   Abdominal   Peds  Hematology   Anesthesia Other Findings   Reproductive/Obstetrics                        Anesthesia Physical Anesthesia Plan  ASA: II  Anesthesia Plan: General   Post-op Pain Management:    Induction: Intravenous  Airway Management Planned: Oral ETT  Additional Equipment:   Intra-op Plan:   Post-operative Plan: Extubation in OR  Informed Consent: I have reviewed the patients History and Physical, chart, labs and discussed the procedure including the risks, benefits and alternatives for the proposed anesthesia with the patient or authorized representative who has indicated his/her understanding and acceptance.   Dental advisory given  Plan Discussed with: CRNA, Anesthesiologist and Surgeon  Anesthesia Plan Comments:        Anesthesia Quick Evaluation

## 2013-12-06 ENCOUNTER — Encounter (HOSPITAL_COMMUNITY): Payer: Self-pay | Admitting: Neurosurgery

## 2013-12-06 LAB — CBC WITH DIFFERENTIAL/PLATELET
Basophils Absolute: 0 10*3/uL (ref 0.0–0.1)
Basophils Relative: 0 % (ref 0–1)
Eosinophils Absolute: 0 10*3/uL (ref 0.0–0.7)
Eosinophils Relative: 0 % (ref 0–5)
HCT: 23.6 % — ABNORMAL LOW (ref 36.0–46.0)
HEMOGLOBIN: 8.1 g/dL — AB (ref 12.0–15.0)
LYMPHS ABS: 0.6 10*3/uL — AB (ref 0.7–4.0)
LYMPHS PCT: 7 % — AB (ref 12–46)
MCH: 29 pg (ref 26.0–34.0)
MCHC: 34.3 g/dL (ref 30.0–36.0)
MCV: 84.6 fL (ref 78.0–100.0)
MONOS PCT: 8 % (ref 3–12)
Monocytes Absolute: 0.7 10*3/uL (ref 0.1–1.0)
NEUTROS ABS: 7.7 10*3/uL (ref 1.7–7.7)
NEUTROS PCT: 85 % — AB (ref 43–77)
Platelets: 262 10*3/uL (ref 150–400)
RBC: 2.79 MIL/uL — AB (ref 3.87–5.11)
RDW: 15.3 % (ref 11.5–15.5)
WBC: 9 10*3/uL (ref 4.0–10.5)

## 2013-12-06 LAB — POCT I-STAT 4, (NA,K, GLUC, HGB,HCT)
GLUCOSE: 157 mg/dL — AB (ref 70–99)
Glucose, Bld: 160 mg/dL — ABNORMAL HIGH (ref 70–99)
HEMATOCRIT: 25 % — AB (ref 36.0–46.0)
HEMATOCRIT: 24 % — AB (ref 36.0–46.0)
Hemoglobin: 8.5 g/dL — ABNORMAL LOW (ref 12.0–15.0)
Hemoglobin: 8.2 g/dL — ABNORMAL LOW (ref 12.0–15.0)
POTASSIUM: 2.7 meq/L — AB (ref 3.7–5.3)
Potassium: 2.8 mEq/L — CL (ref 3.7–5.3)
Sodium: 138 mEq/L (ref 137–147)
Sodium: 138 mEq/L (ref 137–147)

## 2013-12-06 NOTE — Progress Notes (Signed)
UR complete.  Jebadiah Imperato RN, MSN 

## 2013-12-06 NOTE — Progress Notes (Signed)
Subjective: Patient reports Doing well no leg pain condition back soreness  Objective: Vital signs in last 24 hours: Temp:  [97.8 F (36.6 C)-99.1 F (37.3 C)] 98.2 F (36.8 C) (09/22 0625) Pulse Rate:  [90-104] 90 (09/22 0625) Resp:  [10-22] 20 (09/22 0625) BP: (106-140)/(55-87) 120/68 mmHg (09/22 0625) SpO2:  [96 %-99 %] 99 % (09/22 0625) Weight:  [90.81 kg (200 lb 3.2 oz)] 90.81 kg (200 lb 3.2 oz) (09/21 1528)  Intake/Output from previous day: 09/21 0701 - 09/22 0700 In: 3500 [P.O.:600; I.V.:2400; IV Piggyback:500] Out: 3615 [Urine:2035; Drains:230; Blood:1350] Intake/Output this shift:    Awake alert oriented strength out of 5 wound clean dry and intact  Lab Results:  Recent Labs  12/05/13 1240  HGB 8.9*  HCT 27.1*   BMET  Recent Labs  12/05/13 1240  K 3.0*    Studies/Results: Dg Lumbar Spine 2-3 Views  12/05/2013   CLINICAL DATA:  Disc protrusion at L5-S1.  FLUOROSCOPY TIME:  0 min 28 seconds  EXAM: LUMBAR SPINE - 2-3 VIEW; DG C-ARM 61-120 MIN  COMPARISON:  MRI dated 10/12/2013  FINDINGS: AP and lateral C-arm images demonstrate that the patient has undergone interbody and posterior fusion at L5-S1. Hardware appears in good position. Normal alignment.  IMPRESSION: Fusion performed at L5-S1.   Electronically Signed   By: Rozetta Nunnery M.D.   On: 12/05/2013 11:43   Dg C-arm 1-60 Min  12/05/2013   CLINICAL DATA:  Disc protrusion at L5-S1.  FLUOROSCOPY TIME:  0 min 28 seconds  EXAM: LUMBAR SPINE - 2-3 VIEW; DG C-ARM 61-120 MIN  COMPARISON:  MRI dated 10/12/2013  FINDINGS: AP and lateral C-arm images demonstrate that the patient has undergone interbody and posterior fusion at L5-S1. Hardware appears in good position. Normal alignment.  IMPRESSION: Fusion performed at L5-S1.   Electronically Signed   By: Rozetta Nunnery M.D.   On: 12/05/2013 11:43    Assessment/Plan: PT OT mobilizing in brace  LOS: 1 day     Kaedyn Belardo P 12/06/2013, 7:18 AM

## 2013-12-06 NOTE — Plan of Care (Signed)
Problem: Consults Goal: Diagnosis - Spinal Surgery Lumbar fusion L5-S1

## 2013-12-06 NOTE — Evaluation (Signed)
Physical Therapy Evaluation Patient Details Name: Angie Olson MRN: [id number] DOB: 08-Feb-1951 Today's Date: 12/06/2013   History of Present Illness  63 y.o. s/p (MAS) POSTERIOR LUMBAR INTERBODY FUSION (PLIF)LUMBAR FIVE-SACRAL-ONE,REMOVAL HARDWARE LUMBAR FOUR-FIVE  Clinical Impression  Patient did well with mobility today and anticipate steady progress.  Patient will benefit from 1-2 more sessions of PT to increase independence with mobility and progress ambulation.      Follow Up Recommendations Outpatient PT  If needed    Equipment Recommendations  None recommended by PT    Recommendations for Other Services       Precautions / Restrictions Precautions Precautions: Back Precaution Booklet Issued: Yes (comment) Precaution Comments: able to recall 3/3 precautions Required Braces or Orthoses: Spinal Brace Spinal Brace: Lumbar corset;Applied in sitting position Restrictions Weight Bearing Restrictions: No      Mobility  Bed Mobility Overal bed mobility: Needs Assistance Bed Mobility: Rolling;Sidelying to Sit Rolling: Supervision Sidelying to sit: Supervision       General bed mobility comments: cues for technique.  Transfers Overall transfer level: Needs assistance Equipment used: Rolling walker (2 wheeled) Transfers: Sit to/from Stand Sit to Stand: Supervision         General transfer comment: cues for hand placement.  Ambulation/Gait Ambulation/Gait assistance: Supervision Ambulation Distance (Feet): 150 Feet Assistive device: Rolling walker (2 wheeled) Gait Pattern/deviations: Step-through pattern   Gait velocity interpretation: Below normal speed for age/gender    Stairs            Wheelchair Mobility    Modified Rankin (Stroke Patients Only)       Balance Overall balance assessment: No apparent balance deficits (not formally assessed)                                           Pertinent Vitals/Pain Pain  Assessment: 0-10 Pain Score: 7  Pain Location: back Pain Descriptors / Indicators: Throbbing Pain Intervention(s): Limited activity within patient's tolerance;Monitored during session;Patient requesting pain meds-RN notified    Home Living Family/patient expects to be discharged to:: Private residence Living Arrangements: Spouse/significant other Available Help at Discharge: Family;Available 24 hours/day Type of Home: House Home Access: Stairs to enter Entrance Stairs-Rails: Left Entrance Stairs-Number of Steps: 3 Home Layout: Multi-level;Able to live on main level with bedroom/bathroom Home Equipment: Gilford Rile - 2 wheels;Bedside commode;Cane - single point;Adaptive equipment      Prior Function Level of Independence: Independent               Hand Dominance   Dominant Hand: Right    Extremity/Trunk Assessment   Upper Extremity Assessment: Overall WFL for tasks assessed           Lower Extremity Assessment: Overall WFL for tasks assessed      Cervical / Trunk Assessment: Normal  Communication   Communication: No difficulties  Cognition Arousal/Alertness: Awake/alert Behavior During Therapy: WFL for tasks assessed/performed Overall Cognitive Status: Within Functional Limits for tasks assessed                      General Comments      Exercises        Assessment/Plan    PT Assessment Patient needs continued PT services  PT Diagnosis Acute pain   PT Problem List Decreased activity tolerance;Decreased mobility;Decreased knowledge of use of DME;Decreased knowledge of precautions  PT Treatment Interventions DME instruction;Gait training;Stair training;Functional  mobility training;Therapeutic activities;Patient/family education   PT Goals (Current goals can be found in the Care Plan section) Acute Rehab PT Goals Patient Stated Goal: not hurt PT Goal Formulation: With patient/family Time For Goal Achievement: 12/13/13 Potential to Achieve Goals:  Good    Frequency Min 4X/week   Barriers to discharge        Co-evaluation               End of Session Equipment Utilized During Treatment: Back brace Activity Tolerance: Patient tolerated treatment well Patient left: in chair;with call bell/phone within reach;with family/visitor present Nurse Communication: Mobility status         Time: 1013-1028 PT Time Calculation (min): 15 min   Charges:   PT Evaluation $Initial PT Evaluation Tier I: 1 Procedure     PT G CodesShanna Cisco, [place] 12/06/2013, 10:33 AM

## 2013-12-06 NOTE — Evaluation (Addendum)
Occupational Therapy Evaluation Patient Details Name: COILA WARDELL MRN: [id number] DOB: Jun 12, 1950 Today's Date: 12/06/2013    History of Present Illness 63 y.o. s/p (MAS) POSTERIOR LUMBAR INTERBODY FUSION (PLIF)LUMBAR FIVE-SACRAL-ONE,REMOVAL HARDWARE LUMBAR FOUR-FIVE   Clinical Impression   Pt s/p above. Pt independent with ADLs, PTA. Feel pt will benefit from acute OT to increase independence and to reinforce back precautions prior to d/c. Spouse available to assist pt at d/c.    Follow Up Recommendations  No OT follow up;Supervision - Intermittent    Equipment Recommendations  None recommended by OT    Recommendations for Other Services       Precautions / Restrictions Precautions Precautions: Back Precaution Booklet Issued: No Precaution Comments: Reviewed precautions with pt/spouse Required Braces or Orthoses: Spinal Brace Spinal Brace: Lumbar corset;Applied in sitting position Restrictions Weight Bearing Restrictions: No      Mobility Bed Mobility Overal bed mobility: Needs Assistance Bed Mobility: Rolling;Sidelying to Sit Rolling: Supervision Sidelying to sit: Supervision       General bed mobility comments: cues for technique.  Transfers Overall transfer level: Needs assistance   Transfers: Sit to/from Stand Sit to Stand: Min guard;Supervision         General transfer comment: cues for hand placement.    Balance                                            ADL Overall ADL's : Needs assistance/impaired     Grooming: Wash/dry hands;Set up;Supervision/safety;Standing           Upper Body Dressing : Minimal assistance;Sitting   Lower Body Dressing: Sit to/from stand;Minimal assistance   Toilet Transfer: Min guard;Ambulation;RW;Comfort height toilet;Grab bars   Toileting- Clothing Manipulation and Hygiene: Set up;Supervision/safety (standing/sitting)       Functional mobility during ADLs: Min guard;Rolling  walker General ADL Comments: Educated on dressing technique-pt not concerned with dressing as spouse will assist. Explained long handled sponge may be helpful for LB bathing. Educated on toilet aide for hygiene. Educated on safety-use of bag on walker, safe shoewear, rugs. Educated on back brace. demonstrated shower transfer technique for pt/spouse. Educated on use of cup for teeth care and placement of grooming items to avoid breaking precautions. Discussed positioning of pillows.     Vision                     Perception     Praxis      Pertinent Vitals/Pain Pain Assessment: 0-10 Pain Score:  (7.5) Pain Location: back Pain Descriptors / Indicators: Throbbing Pain Intervention(s): Monitored during session     Hand Dominance Right   Extremity/Trunk Assessment Upper Extremity Assessment Upper Extremity Assessment: Overall WFL for tasks assessed   Lower Extremity Assessment Lower Extremity Assessment: Defer to PT evaluation       Communication Communication Communication: No difficulties   Cognition Arousal/Alertness: Awake/alert Behavior During Therapy: WFL for tasks assessed/performed Overall Cognitive Status: Within Functional Limits for tasks assessed                     General Comments       Exercises       Shoulder Instructions      Home Living Family/patient expects to be discharged to:: Private residence Living Arrangements: Spouse/significant other Available Help at Discharge: Family;Available 24 hours/day Type of Home: House Home Access: Stairs  to enter Entrance Stairs-Number of Steps: 3 Entrance Stairs-Rails: Left Home Layout: Multi-level;Able to live on main level with bedroom/bathroom     Bathroom Shower/Tub: Tub/shower unit;Walk-in shower   Bathroom Toilet: Handicapped height     Home Equipment: Environmental consultant - 2 wheels;Bedside commode;Cane - single point;Adaptive equipment Adaptive Equipment: Reacher        Prior  Functioning/Environment Level of Independence: Independent             OT Diagnosis: Acute pain   OT Problem List: Decreased activity tolerance;Decreased knowledge of use of DME or AE;Decreased knowledge of precautions;Pain   OT Treatment/Interventions: Self-care/ADL training;DME and/or AE instruction;Therapeutic activities;Patient/family education;Balance training    OT Goals(Current goals can be found in the care plan section) Acute Rehab OT Goals Patient Stated Goal: not stated OT Goal Formulation: With patient Time For Goal Achievement: 12/13/13 Potential to Achieve Goals: Good ADL Goals Pt Will Perform Grooming: with modified independence;standing Pt Will Transfer to Toilet: with modified independence;ambulating (3 in 1 over commode) Pt Will Perform Toileting - Clothing Manipulation and hygiene: with modified independence;sit to/from stand;with adaptive equipment Pt Will Perform Tub/Shower Transfer: Shower transfer;with supervision;ambulating;shower seat;rolling walker  OT Frequency: Min 2X/week   Barriers to D/C:            Co-evaluation              End of Session Equipment Utilized During Treatment: Gait belt;Rolling walker;Back brace  Activity Tolerance: Patient tolerated treatment well Patient left: in chair;with call bell/phone within reach;with family/visitor present   Time: 2633-3545 OT Time Calculation (min): 23 min Charges:  OT General Charges $OT Visit: 1 Procedure OT Evaluation $Initial OT Evaluation Tier I: 1 Procedure OT Treatments $Self Care/Home Management : 8-22 mins G-CodesBenito Mccreedy OTR/L [phone] 12/06/2013, 10:25 AM

## 2013-12-06 NOTE — Clinical Social Work Note (Signed)
CSW Consult Acknowledged:   CSW received consult for SNF placement. CSW reviewed chart. PT is recommending outpatient PT and OT report no follow-up. At this time there are no additional needs idenifted. CSW will sign off.   [place], MSW, [place]

## 2013-12-06 NOTE — Progress Notes (Signed)
1340 - notified Dr. Saintclair Halsted via secretary of pt's HCT/HGB 8.1/ 23.6 respectively. HR 102 at 1333. MD, Dr. Saintclair Halsted, ordered CBC for tomorrow AM. Nurse read back and verified and entered order. Will monitor pt closely Angeline Slim I 2:07 PM 12/06/2013

## 2013-12-06 NOTE — Plan of Care (Signed)
Problem: Consults Goal: Diagnosis - Spinal Surgery Outcome: Completed/Met Date Met:  12/06/13 [place]/Lumbar Spine Fusion

## 2013-12-07 LAB — CBC
HCT: 23.4 % — ABNORMAL LOW (ref 36.0–46.0)
Hemoglobin: 7.7 g/dL — ABNORMAL LOW (ref 12.0–15.0)
MCH: 28.3 pg (ref 26.0–34.0)
MCHC: 32.9 g/dL (ref 30.0–36.0)
MCV: 86 fL (ref 78.0–100.0)
Platelets: 259 10*3/uL (ref 150–400)
RBC: 2.72 MIL/uL — AB (ref 3.87–5.11)
RDW: 15.7 % — ABNORMAL HIGH (ref 11.5–15.5)
WBC: 9.5 10*3/uL (ref 4.0–10.5)

## 2013-12-07 MED ORDER — FERROUS SULFATE 325 (65 FE) MG PO TABS
325.0000 mg | ORAL_TABLET | Freq: Three times a day (TID) | ORAL | Status: DC
  Administered 2013-12-07 – 2013-12-08 (×2): 325 mg via ORAL
  Filled 2013-12-07 (×2): qty 1

## 2013-12-07 NOTE — Progress Notes (Signed)
Physical Therapy Treatment Patient Details Name: Angie Olson MRN: [id number] DOB: 27-Jan-1951 Today's Date: 12/07/2013    History of Present Illness 63 y.o. female admitted to [facility] on 12/05/13 s/p elective PLIF L5/S1 with removal of hardware L4/5.  Pt with significant PMHx of HTN, anxiety, TIA, SOB, L ankle surgery, L shoulder surgery, low back surgery, and  TKA R.      PT Comments    Pt is progressing really well with her mobility with RW despite self reports of feeling weak due to "low blood levels" today (per pt report).  Hgb is 7.7 and BPs are a little soft. Pt is progressing well enough to return home with family's assist and no therapy follow up (will defer the decision to do OP PT to MD at f/u appointment).  PT will continue to follow acutely.  Plan to practice stairs for home entry next session.   Follow Up Recommendations  No PT follow up (will defer OP PT to MD at follow up)     Equipment Recommendations  None recommended by PT    Recommendations for Other Services   NA     Precautions / Restrictions Precautions Precautions: Back Precaution Booklet Issued: Yes (comment) Precaution Comments: verbally reviewed back precautions, no sitting for longer than 30 mins, walking as main form of exercise and functional examples of back precautions (no BAT).  Required Braces or Orthoses: Spinal Brace Spinal Brace: Lumbar corset;Applied in sitting position (adjusted smaller to fit her better. )    Mobility                 Transfers Overall transfer level: Needs assistance Equipment used: Rolling walker (2 wheeled) Transfers: Sit to/from Stand Sit to Stand: Supervision         General transfer comment: supervision for safety due to slow speed of transitions.   Ambulation/Gait Ambulation/Gait assistance: Supervision Ambulation Distance (Feet): 510 Feet Assistive device: Rolling walker (2 wheeled) Gait Pattern/deviations: Step-through pattern;Shuffle;Trunk  flexed Gait velocity: decreased Gait velocity interpretation: Below normal speed for age/gender General Gait Details: verbal cues for upright posture, stay closer to RW. Supervision for safety due to reports of mild lightheadedness with gait.           Balance Overall balance assessment: Needs assistance Sitting-balance support: Feet supported;No upper extremity supported Sitting balance-Leahy Scale: Good     Standing balance support: Bilateral upper extremity supported;No upper extremity supported;Single extremity supported Standing balance-Leahy Scale: Good Standing balance comment: able to wash hands at sink with supervision and no upper extremity support.                     Cognition Arousal/Alertness: Awake/alert Behavior During Therapy: WFL for tasks assessed/performed Overall Cognitive Status: Within Functional Limits for tasks assessed                             Pertinent Vitals/Pain Pain Assessment: 0-10 Pain Score: 4  Pain Location: lower back Pain Descriptors / Indicators: Aching Pain Intervention(s): Limited activity within patient's tolerance;Monitored during session;Repositioned           PT Goals (current goals can now be found in the care plan section) Acute Rehab PT Goals Patient Stated Goal: not hurt Progress towards PT goals: Progressing toward goals    Frequency  Min 5X/week    PT Plan Current plan remains appropriate       End of Session Equipment Utilized During Treatment: Back  brace Activity Tolerance: Patient tolerated treatment well Patient left: in chair;with call bell/phone within reach;with family/visitor present     Time: 6503-5465 PT Time Calculation (min): 20 min  Charges:  $Gait Training: 8-22 mins            Maimuna Leaman B. [place], [place], DPT [phone]   12/07/2013, 4:58 PM

## 2013-12-07 NOTE — Progress Notes (Signed)
OT Cancellation Note  Patient Details Name: Angie Olson MRN: [id number] DOB: 1950-07-26   Cancelled Treatment:    Reason Eval/Treat Not Completed: Other (comment) spoke with pt and she feels good about information covered yesterday with OT and has had to get in/out of shower before with a walker with her knee surgery. Spouse will be available to assist her at home. OT signing off.  Benito Mccreedy OTR/L [phone] 12/07/2013, 12:23 PM

## 2013-12-07 NOTE — Progress Notes (Signed)
Subjective: Patient reports Doing better this morning no dizziness when she gets up no leg pain back pain is well managed  Objective: Vital signs in last 24 hours: Temp:  [98.1 F (36.7 C)-99.1 F (37.3 C)] 98.2 F (36.8 C) (09/23 1027) Pulse Rate:  [95-106] 99 (09/23 1027) Resp:  [18-20] 18 (09/23 1027) BP: (121-181)/(60-96) 121/60 mmHg (09/23 1027) SpO2:  [97 %-100 %] 99 % (09/23 1027)  Intake/Output from previous day: 09/22 0701 - 09/23 0700 In: 220 [P.O.:220] Out: 100 [Drains:100] Intake/Output this shift:    Strength out of 5 wound clean dry and intact  Lab Results:  Recent Labs  12/06/13 0752 12/07/13 0502  WBC 9.0 9.5  HGB 8.1* 7.7*  HCT 23.6* 23.4*  PLT 262 259   BMET  Recent Labs  12/05/13 1022 12/05/13 1031 12/05/13 1240  NA 138 138  --   K 2.7* 2.8* 3.0*  GLUCOSE 157* 160*  --     Studies/Results: No results found.  Assessment/Plan: Continue to mobilize physical and occupational therapy probable discharge tomorrow  LOS: 2 days     Arrion Broaddus P 12/07/2013, 1:53 PM

## 2013-12-08 MED ORDER — FERROUS SULFATE 325 (65 FE) MG PO TABS: 325.0000 mg | ORAL_TABLET | Freq: Three times a day (TID) | ORAL | Status: DC

## 2013-12-08 MED ORDER — OXYCODONE-ACETAMINOPHEN 5-325 MG PO TABS: 1.0000 | ORAL_TABLET | ORAL | Status: DC | PRN

## 2013-12-08 MED ORDER — CYCLOBENZAPRINE HCL 10 MG PO TABS: 10.0000 mg | ORAL_TABLET | Freq: Three times a day (TID) | ORAL | Status: DC | PRN

## 2013-12-08 NOTE — Progress Notes (Signed)
Discharge orders received. Pt for discharge home today. IV, hemovac d/c'd. Honeycomb dressing and gauze dressing both clean, dry, intact to lower back. Pt given discharge instructions and prescriptions with verbalized understanding. Family downstairs to pick up pt. Volunteer brought pt downstairs via wheelchair.

## 2013-12-08 NOTE — Progress Notes (Signed)
Patient ID: Angie Olson, female   DOB: 07-19-1950, 63 y.o.   MRN: [id number] Patient doing well no leg pain back pain well-controlled  Neurologically stable discharged home

## 2013-12-08 NOTE — Discharge Summary (Signed)
Physician Discharge Summary  Patient ID: Angie Olson MRN: [id number] DOB/AGE: Jul 28, 1950 63 y.o.  Admit date: 12/05/2013 Discharge date: 12/08/2013  Admission Diagnoses: Herniated nucleus the posts L5-S1 with instability L5-S1  Discharge Diagnoses: Same Active Problems:   HNP (herniated nucleus pulposus), lumbar   Discharged Condition: good  Hospital Course: Patient is been hospital underwent decompression stabilization procedure at L5-S1 postop patient did very well with recovered in the floor on the floor she was angling and voiding spontaneously tolerating regular diet stable for discharge home. At the time of discharge her hemoglobin and hematocrit were moderately low with hematocrit 23 patient is asymptomatic so we elected to I initiated her on iron we will send her home with prescription for iron supplements and followup CBC on followup.  Consults: Significant Diagnostic Studies: Treatments: Decompression stabilization procedure L5 and S1 Discharge Exam: Blood pressure 134/65, pulse 95, temperature 97.8 F (36.6 C), temperature source Oral, resp. rate 18, height 5\' 5"  (1.651 m), weight 90.81 kg (200 lb 3.2 oz), SpO2 100.00%. Strength out of 5 wound clean and dry  Disposition: Home     Medication List         ALPRAZolam 1 MG tablet  Commonly known as:  XANAX  Take 0.5-1 mg by mouth at bedtime.     atorvastatin 20 MG tablet  Commonly known as:  LIPITOR  Take 20 mg by mouth every morning.     cetirizine 10 MG tablet  Commonly known as:  ZYRTEC  Take 10 mg by mouth daily.     cyclobenzaprine 10 MG tablet  Commonly known as:  FLEXERIL  Take 10 mg by mouth 3 (three) times daily as needed for muscle spasms.     cyclobenzaprine 10 MG tablet  Commonly known as:  FLEXERIL  Take 1 tablet (10 mg total) by mouth 3 (three) times daily as needed for muscle spasms.     diclofenac sodium 1 % Gel  Commonly known as:  VOLTAREN  Apply 2 g topically 4 (four) times daily  as needed.     esomeprazole 40 MG capsule  Commonly known as:  NEXIUM  Take 40 mg by mouth daily before breakfast.     hydrochlorothiazide 25 MG tablet  Commonly known as:  HYDRODIURIL  Take 25 mg by mouth every morning.     leflunomide 20 MG tablet  Commonly known as:  ARAVA  Take 20 mg by mouth daily.     montelukast 10 MG tablet  Commonly known as:  SINGULAIR  Take 10 mg by mouth every evening.     oxyCODONE-acetaminophen 5-325 MG per tablet  Commonly known as:  PERCOCET/ROXICET  Take 1 tablet by mouth every 4 (four) hours as needed for moderate pain or severe pain.     oxyCODONE-acetaminophen 5-325 MG per tablet  Commonly known as:  PERCOCET/ROXICET  Take 1-2 tablets by mouth every 4 (four) hours as needed for moderate pain.     PATANASE 0.6 % Soln  Generic drug:  Olopatadine HCl  Place 1 puff into the nose daily.     predniSONE 5 MG tablet  Commonly known as:  DELTASONE  Take 5-10 mg by mouth daily. Will take 1-2 tablets at a time depending on inflammation     sucralfate 1 G tablet  Commonly known as:  CARAFATE  Take 1 g by mouth 2 (two) times daily as needed (for indigestion).     traMADol 50 MG tablet  Commonly known as:  ULTRAM  Take 1 tablet (  50 mg total) by mouth every 6 (six) hours as needed (mild to moderate pain).           Follow-up Information   Follow up with [facility], MD.   Specialty:  Neurosurgery   Contact information:   [address] [place] [address] [phone]       Signed: Liviya Santini P 12/08/2013, 7:46 AM

## 2013-12-08 NOTE — Discharge Instructions (Signed)
No lifting no bending no twisting no driving a riding a car less she's come back and forth to see me.

## 2013-12-19 ENCOUNTER — Encounter (HOSPITAL_COMMUNITY): Payer: Self-pay | Admitting: Neurosurgery

## 2013-12-20 DIAGNOSIS — M5137 Other intervertebral disc degeneration, lumbosacral region: Secondary | ICD-10-CM | POA: Insufficient documentation

## 2014-01-10 ENCOUNTER — Encounter (HOSPITAL_COMMUNITY): Payer: Self-pay | Admitting: *Deleted

## 2014-01-10 ENCOUNTER — Other Ambulatory Visit (HOSPITAL_COMMUNITY): Payer: Self-pay | Admitting: Neurosurgery

## 2014-01-10 DIAGNOSIS — M5416 Radiculopathy, lumbar region: Secondary | ICD-10-CM

## 2014-01-11 ENCOUNTER — Ambulatory Visit (HOSPITAL_COMMUNITY)
Admission: RE | Admit: 2014-01-11 | Discharge: 2014-01-11 | Disposition: A | Discharge status: Home / Self Care | Payer: 59 | Source: Ambulatory Visit | Attending: Neurosurgery | Admitting: Neurosurgery

## 2014-01-11 ENCOUNTER — Encounter (HOSPITAL_COMMUNITY): Payer: 59 | Admitting: Anesthesiology

## 2014-01-11 ENCOUNTER — Inpatient Hospital Stay (HOSPITAL_COMMUNITY)
Admission: RE | Admit: 2014-01-11 | Discharge: 2014-01-14 | DRG: 909 | Disposition: A | Discharge status: Home / Self Care | Payer: 59 | Source: Ambulatory Visit | Attending: Neurosurgery | Admitting: Neurosurgery

## 2014-01-11 ENCOUNTER — Encounter (HOSPITAL_COMMUNITY)
Admission: RE | Disposition: A | Discharge status: Home / Self Care | Payer: Self-pay | Source: Ambulatory Visit | Attending: Neurosurgery

## 2014-01-11 ENCOUNTER — Ambulatory Visit (HOSPITAL_COMMUNITY): Payer: 59 | Admitting: Anesthesiology

## 2014-01-11 ENCOUNTER — Encounter (HOSPITAL_COMMUNITY): Payer: Self-pay | Admitting: *Deleted

## 2014-01-11 ENCOUNTER — Other Ambulatory Visit: Payer: Self-pay | Admitting: Neurosurgery

## 2014-01-11 DIAGNOSIS — K219 Gastro-esophageal reflux disease without esophagitis: Secondary | ICD-10-CM | POA: Diagnosis present

## 2014-01-11 DIAGNOSIS — M79605 Pain in left leg: Secondary | ICD-10-CM

## 2014-01-11 DIAGNOSIS — M5416 Radiculopathy, lumbar region: Secondary | ICD-10-CM

## 2014-01-11 DIAGNOSIS — S064XAA Epidural hemorrhage with loss of consciousness status unknown, initial encounter: Secondary | ICD-10-CM

## 2014-01-11 DIAGNOSIS — M1990 Unspecified osteoarthritis, unspecified site: Secondary | ICD-10-CM | POA: Diagnosis present

## 2014-01-11 DIAGNOSIS — M65871 Other synovitis and tenosynovitis, right ankle and foot: Secondary | ICD-10-CM | POA: Diagnosis present

## 2014-01-11 DIAGNOSIS — Y838 Other surgical procedures as the cause of abnormal reaction of the patient, or of later complication, without mention of misadventure at the time of the procedure: Secondary | ICD-10-CM | POA: Diagnosis present

## 2014-01-11 DIAGNOSIS — S064X9A Epidural hemorrhage with loss of consciousness of unspecified duration, initial encounter: Secondary | ICD-10-CM | POA: Diagnosis present

## 2014-01-11 DIAGNOSIS — Z8673 Personal history of transient ischemic attack (TIA), and cerebral infarction without residual deficits: Secondary | ICD-10-CM

## 2014-01-11 DIAGNOSIS — Z981 Arthrodesis status: Secondary | ICD-10-CM | POA: Insufficient documentation

## 2014-01-11 DIAGNOSIS — Z7952 Long term (current) use of systemic steroids: Secondary | ICD-10-CM

## 2014-01-11 DIAGNOSIS — I10 Essential (primary) hypertension: Secondary | ICD-10-CM | POA: Diagnosis present

## 2014-01-11 DIAGNOSIS — Z96651 Presence of right artificial knee joint: Secondary | ICD-10-CM | POA: Diagnosis present

## 2014-01-11 DIAGNOSIS — R52 Pain, unspecified: Secondary | ICD-10-CM

## 2014-01-11 DIAGNOSIS — G9751 Postprocedural hemorrhage and hematoma of a nervous system organ or structure following a nervous system procedure: Principal | ICD-10-CM | POA: Diagnosis present

## 2014-01-11 HISTORY — DX: Epidural hemorrhage with loss of consciousness status unknown, initial encounter: S06.4XAA

## 2014-01-11 HISTORY — PX: LUMBAR WOUND DEBRIDEMENT: SHX1988

## 2014-01-11 LAB — BASIC METABOLIC PANEL
ANION GAP: 15 (ref 5–15)
BUN: 15 mg/dL (ref 6–23)
CALCIUM: 9.3 mg/dL (ref 8.4–10.5)
CO2: 28 mEq/L (ref 19–32)
Chloride: 99 mEq/L (ref 96–112)
Creatinine, Ser: 0.69 mg/dL (ref 0.50–1.10)
GFR calc Af Amer: >90 mL/min (ref >90)
Glucose, Bld: 98 mg/dL (ref 70–99)
Potassium: 3.4 mEq/L — ABNORMAL LOW (ref 3.7–5.3)
SODIUM: 142 meq/L (ref 137–147)

## 2014-01-11 LAB — GRAM STAIN

## 2014-01-11 LAB — CBC
HCT: 31.9 % — ABNORMAL LOW (ref 36.0–46.0)
Hemoglobin: 10 g/dL — ABNORMAL LOW (ref 12.0–15.0)
MCH: 25.6 pg — ABNORMAL LOW (ref 26.0–34.0)
MCHC: 31.3 g/dL (ref 30.0–36.0)
MCV: 81.6 fL (ref 78.0–100.0)
PLATELETS: 326 10*3/uL (ref 150–400)
RBC: 3.91 MIL/uL (ref 3.87–5.11)
RDW: 15.2 % (ref 11.5–15.5)
WBC: 6.7 10*3/uL (ref 4.0–10.5)

## 2014-01-11 LAB — SURGICAL PCR SCREEN
MRSA, PCR: NEGATIVE
STAPHYLOCOCCUS AUREUS: NEGATIVE

## 2014-01-11 SURGERY — LUMBAR WOUND DEBRIDEMENT
Anesthesia: General | Site: Back

## 2014-01-11 MED ORDER — FENTANYL CITRATE 0.05 MG/ML IJ SOLN
INTRAMUSCULAR | Status: AC
  Filled 2014-01-11: qty 5

## 2014-01-11 MED ORDER — NEOSTIGMINE METHYLSULFATE 10 MG/10ML IV SOLN
INTRAVENOUS | Status: DC | PRN
  Administered 2014-01-11: 5 mg via INTRAVENOUS

## 2014-01-11 MED ORDER — ACETAMINOPHEN 650 MG RE SUPP: 650.0000 mg | RECTAL | Status: DC | PRN

## 2014-01-11 MED ORDER — CYCLOBENZAPRINE HCL 10 MG PO TABS: 10.0000 mg | ORAL_TABLET | Freq: Three times a day (TID) | ORAL | Status: DC | PRN

## 2014-01-11 MED ORDER — ALUM & MAG HYDROXIDE-SIMETH 200-200-20 MG/5ML PO SUSP: 30.0000 mL | Freq: Four times a day (QID) | ORAL | Status: DC | PRN

## 2014-01-11 MED ORDER — CEFAZOLIN SODIUM 1-5 GM-% IV SOLN
1.0000 g | Freq: Three times a day (TID) | INTRAVENOUS | Status: AC
  Administered 2014-01-11 – 2014-01-12 (×2): 1 g via INTRAVENOUS
  Filled 2014-01-11 (×2): qty 50

## 2014-01-11 MED ORDER — HYDROMORPHONE HCL 1 MG/ML IJ SOLN
0.5000 mg | INTRAMUSCULAR | Status: DC | PRN
  Administered 2014-01-11 – 2014-01-12 (×2): 1 mg via INTRAVENOUS
  Filled 2014-01-11 (×2): qty 1

## 2014-01-11 MED ORDER — MORPHINE SULFATE 2 MG/ML IJ SOLN
2.0000 mg | INTRAMUSCULAR | Status: DC | PRN
  Administered 2014-01-11 (×2): 2 mg via INTRAVENOUS

## 2014-01-11 MED ORDER — MIDAZOLAM HCL 2 MG/2ML IJ SOLN: 0.5000 mg | Freq: Once | INTRAMUSCULAR | Status: DC | PRN

## 2014-01-11 MED ORDER — PROMETHAZINE HCL 25 MG/ML IJ SOLN
6.2500 mg | INTRAMUSCULAR | Status: DC | PRN
  Administered 2014-01-11: 6.25 mg via INTRAVENOUS

## 2014-01-11 MED ORDER — GLYCOPYRROLATE 0.2 MG/ML IJ SOLN
INTRAMUSCULAR | Status: DC | PRN
  Administered 2014-01-11: .4 mg via INTRAVENOUS

## 2014-01-11 MED ORDER — MUPIROCIN 2 % EX OINT
TOPICAL_OINTMENT | CUTANEOUS | Status: AC
  Filled 2014-01-11: qty 22

## 2014-01-11 MED ORDER — MORPHINE SULFATE 2 MG/ML IJ SOLN
INTRAMUSCULAR | Status: AC
  Filled 2014-01-11: qty 1

## 2014-01-11 MED ORDER — MUPIROCIN 2 % EX OINT: 1.0000 "application " | TOPICAL_OINTMENT | Freq: Once | CUTANEOUS | Status: DC

## 2014-01-11 MED ORDER — GABAPENTIN 300 MG PO CAPS
300.0000 mg | ORAL_CAPSULE | Freq: Every day | ORAL | Status: DC
Start: 2014-01-11 — End: 2014-01-12
  Administered 2014-01-11: 300 mg via ORAL
  Filled 2014-01-11: qty 1

## 2014-01-11 MED ORDER — OXYCODONE-ACETAMINOPHEN 5-325 MG PO TABS
1.0000 | ORAL_TABLET | ORAL | Status: DC | PRN
  Administered 2014-01-11 – 2014-01-12 (×2): 2 via ORAL
  Filled 2014-01-11 (×2): qty 2

## 2014-01-11 MED ORDER — PHENOL 1.4 % MT LIQD: 1.0000 | OROMUCOSAL | Status: DC | PRN

## 2014-01-11 MED ORDER — SODIUM CHLORIDE 0.9 % IJ SOLN
3.0000 mL | INTRAMUSCULAR | Status: DC | PRN
Start: 2014-01-11 — End: 2014-01-14

## 2014-01-11 MED ORDER — DIPHENHYDRAMINE HCL 50 MG/ML IJ SOLN: 10.0000 mg | Freq: Once | INTRAMUSCULAR | Status: DC

## 2014-01-11 MED ORDER — HYDROCHLOROTHIAZIDE 25 MG PO TABS
25.0000 mg | ORAL_TABLET | Freq: Every morning | ORAL | Status: DC
  Administered 2014-01-12 – 2014-01-14 (×3): 25 mg via ORAL
  Filled 2014-01-11 (×3): qty 1

## 2014-01-11 MED ORDER — ONDANSETRON HCL 4 MG/2ML IJ SOLN: 4.0000 mg | INTRAMUSCULAR | Status: DC | PRN

## 2014-01-11 MED ORDER — ONDANSETRON HCL 4 MG/2ML IJ SOLN
INTRAMUSCULAR | Status: DC | PRN
  Administered 2014-01-11: 4 mg via INTRAVENOUS

## 2014-01-11 MED ORDER — SUCRALFATE 1 G PO TABS: 1.0000 g | ORAL_TABLET | Freq: Two times a day (BID) | ORAL | Status: DC | PRN

## 2014-01-11 MED ORDER — OXYCODONE HCL 5 MG/5ML PO SOLN: 5.0000 mg | Freq: Once | ORAL | Status: DC | PRN

## 2014-01-11 MED ORDER — LEFLUNOMIDE 20 MG PO TABS
20.0000 mg | ORAL_TABLET | Freq: Every day | ORAL | Status: DC
Start: 2014-01-11 — End: 2014-01-14
  Filled 2014-01-11 (×4): qty 1

## 2014-01-11 MED ORDER — VANCOMYCIN HCL 1000 MG IV SOLR
INTRAVENOUS | Status: AC
  Filled 2014-01-11: qty 1000

## 2014-01-11 MED ORDER — LACTATED RINGERS IV SOLN
INTRAVENOUS | Status: DC
  Administered 2014-01-11: 14:00:00 via INTRAVENOUS

## 2014-01-11 MED ORDER — CEFAZOLIN SODIUM-DEXTROSE 2-3 GM-% IV SOLR
INTRAVENOUS | Status: DC | PRN
  Administered 2014-01-11: 2 g via INTRAVENOUS

## 2014-01-11 MED ORDER — LIDOCAINE HCL (CARDIAC) 20 MG/ML IV SOLN
INTRAVENOUS | Status: DC | PRN
  Administered 2014-01-11: 40 mg via INTRAVENOUS

## 2014-01-11 MED ORDER — FENTANYL CITRATE 0.05 MG/ML IJ SOLN
INTRAMUSCULAR | Status: DC | PRN
  Administered 2014-01-11: 200 ug via INTRAVENOUS
  Administered 2014-01-11 (×4): 50 ug via INTRAVENOUS

## 2014-01-11 MED ORDER — ACETAMINOPHEN 325 MG PO TABS
650.0000 mg | ORAL_TABLET | ORAL | Status: DC | PRN
  Administered 2014-01-13 (×2): 650 mg via ORAL
  Filled 2014-01-11 (×2): qty 2

## 2014-01-11 MED ORDER — BUPIVACAINE HCL 0.25 % IJ SOLN
INTRAMUSCULAR | Status: DC | PRN
  Administered 2014-01-11: 10 mL

## 2014-01-11 MED ORDER — DOCUSATE SODIUM 100 MG PO CAPS
100.0000 mg | ORAL_CAPSULE | Freq: Two times a day (BID) | ORAL | Status: DC
  Administered 2014-01-11 – 2014-01-14 (×5): 100 mg via ORAL
  Filled 2014-01-11 (×6): qty 1

## 2014-01-11 MED ORDER — PANTOPRAZOLE SODIUM 40 MG PO TBEC
40.0000 mg | DELAYED_RELEASE_TABLET | Freq: Every day | ORAL | Status: DC
Start: 2014-01-12 — End: 2014-01-14
  Administered 2014-01-12 – 2014-01-14 (×3): 40 mg via ORAL
  Filled 2014-01-11 (×3): qty 1

## 2014-01-11 MED ORDER — HYDROMORPHONE HCL 1 MG/ML IJ SOLN
0.2500 mg | INTRAMUSCULAR | Status: DC | PRN
  Administered 2014-01-11 (×4): 0.5 mg via INTRAVENOUS

## 2014-01-11 MED ORDER — MOMETASONE FURO-FORMOTEROL FUM 200-5 MCG/ACT IN AERO
2.0000 | INHALATION_SPRAY | Freq: Two times a day (BID) | RESPIRATORY_TRACT | Status: DC
  Administered 2014-01-12 – 2014-01-13 (×4): 2 via RESPIRATORY_TRACT
  Filled 2014-01-11: qty 8.8

## 2014-01-11 MED ORDER — SCOPOLAMINE 1 MG/3DAYS TD PT72
MEDICATED_PATCH | TRANSDERMAL | Status: DC | PRN
  Administered 2014-01-11: 1 via TRANSDERMAL

## 2014-01-11 MED ORDER — MIDAZOLAM HCL 2 MG/2ML IJ SOLN
INTRAMUSCULAR | Status: AC
  Filled 2014-01-11: qty 2

## 2014-01-11 MED ORDER — ROCURONIUM BROMIDE 100 MG/10ML IV SOLN
INTRAVENOUS | Status: DC | PRN
  Administered 2014-01-11: 40 mg via INTRAVENOUS

## 2014-01-11 MED ORDER — CYCLOBENZAPRINE HCL 10 MG PO TABS
10.0000 mg | ORAL_TABLET | Freq: Three times a day (TID) | ORAL | Status: DC | PRN
  Administered 2014-01-11: 10 mg via ORAL
  Filled 2014-01-11 (×2): qty 1

## 2014-01-11 MED ORDER — DEXAMETHASONE SODIUM PHOSPHATE 10 MG/ML IJ SOLN
10.0000 mg | Freq: Four times a day (QID) | INTRAMUSCULAR | Status: AC
  Administered 2014-01-11 – 2014-01-12 (×4): 10 mg via INTRAVENOUS
  Filled 2014-01-11 (×4): qty 1

## 2014-01-11 MED ORDER — ALPRAZOLAM 0.5 MG PO TABS
0.5000 mg | ORAL_TABLET | Freq: Every day | ORAL | Status: DC
  Administered 2014-01-11 – 2014-01-13 (×3): 1 mg via ORAL
  Filled 2014-01-11 (×3): qty 2

## 2014-01-11 MED ORDER — MENTHOL 3 MG MT LOZG: 1.0000 | LOZENGE | OROMUCOSAL | Status: DC | PRN

## 2014-01-11 MED ORDER — VANCOMYCIN HCL 1000 MG IV SOLR
INTRAVENOUS | Status: DC | PRN
  Administered 2014-01-11: 1000 mg via TOPICAL

## 2014-01-11 MED ORDER — MORPHINE SULFATE 2 MG/ML IJ SOLN
INTRAMUSCULAR | Status: AC
  Administered 2014-01-11: 2 mg via INTRAVENOUS
  Filled 2014-01-11: qty 1

## 2014-01-11 MED ORDER — OXYCODONE HCL 5 MG PO TABS: 5.0000 mg | ORAL_TABLET | Freq: Once | ORAL | Status: DC | PRN

## 2014-01-11 MED ORDER — LORATADINE 10 MG PO TABS
10.0000 mg | ORAL_TABLET | Freq: Every day | ORAL | Status: DC
  Administered 2014-01-12 – 2014-01-14 (×3): 10 mg via ORAL
  Filled 2014-01-11 (×3): qty 1

## 2014-01-11 MED ORDER — LACTATED RINGERS IV SOLN
INTRAVENOUS | Status: DC | PRN
  Administered 2014-01-11 (×2): via INTRAVENOUS

## 2014-01-11 MED ORDER — MEPERIDINE HCL 25 MG/ML IJ SOLN: 6.2500 mg | INTRAMUSCULAR | Status: DC | PRN

## 2014-01-11 MED ORDER — HEMOSTATIC AGENTS (NO CHARGE) OPTIME
TOPICAL | Status: DC | PRN
  Administered 2014-01-11: 1 via TOPICAL

## 2014-01-11 MED ORDER — THROMBIN 5000 UNITS EX SOLR
CUTANEOUS | Status: DC | PRN
  Administered 2014-01-11 (×2): 5000 [IU] via TOPICAL

## 2014-01-11 MED ORDER — PREDNISONE 5 MG PO TABS: 5.0000 mg | ORAL_TABLET | Freq: Every day | ORAL | Status: DC

## 2014-01-11 MED ORDER — OXYCODONE-ACETAMINOPHEN 5-325 MG PO TABS: 1.0000 | ORAL_TABLET | ORAL | Status: DC | PRN

## 2014-01-11 MED ORDER — PNEUMOCOCCAL VAC POLYVALENT 25 MCG/0.5ML IJ INJ
0.5000 mL | INJECTION | INTRAMUSCULAR | Status: AC
Start: 2014-01-12 — End: 2014-01-12
  Administered 2014-01-12: 0.5 mL via INTRAMUSCULAR
  Filled 2014-01-11: qty 0.5

## 2014-01-11 MED ORDER — SODIUM CHLORIDE 0.9 % IJ SOLN
3.0000 mL | Freq: Two times a day (BID) | INTRAMUSCULAR | Status: DC
  Administered 2014-01-12: 3 mL via INTRAVENOUS

## 2014-01-11 MED ORDER — SODIUM CHLORIDE 0.9 % IR SOLN
Status: DC | PRN
  Administered 2014-01-11: 17:00:00

## 2014-01-11 MED ORDER — MONTELUKAST SODIUM 10 MG PO TABS
10.0000 mg | ORAL_TABLET | Freq: Every evening | ORAL | Status: DC
  Administered 2014-01-11 – 2014-01-13 (×3): 10 mg via ORAL
  Filled 2014-01-11 (×3): qty 1

## 2014-01-11 MED ORDER — ATORVASTATIN CALCIUM 10 MG PO TABS
20.0000 mg | ORAL_TABLET | Freq: Every morning | ORAL | Status: DC
Start: 2014-01-12 — End: 2014-01-14
  Administered 2014-01-12 – 2014-01-14 (×3): 20 mg via ORAL
  Filled 2014-01-11 (×3): qty 2

## 2014-01-11 MED ORDER — MIDAZOLAM HCL 5 MG/5ML IJ SOLN
INTRAMUSCULAR | Status: DC | PRN
  Administered 2014-01-11: 2 mg via INTRAVENOUS

## 2014-01-11 MED ORDER — HYDROMORPHONE HCL 1 MG/ML IJ SOLN
INTRAMUSCULAR | Status: AC
  Administered 2014-01-11: 0.5 mg via INTRAVENOUS
  Filled 2014-01-11: qty 1

## 2014-01-11 MED ORDER — PROPOFOL 10 MG/ML IV BOLUS
INTRAVENOUS | Status: DC | PRN
  Administered 2014-01-11: 160 mg via INTRAVENOUS

## 2014-01-11 MED ORDER — PANTOPRAZOLE SODIUM 40 MG PO TBEC: 40.0000 mg | DELAYED_RELEASE_TABLET | Freq: Two times a day (BID) | ORAL | Status: DC

## 2014-01-11 MED ORDER — OLOPATADINE HCL 0.6 % NA SOLN: 1.0000 | Freq: Every day | NASAL | Status: DC

## 2014-01-11 MED ORDER — PROMETHAZINE HCL 25 MG/ML IJ SOLN
INTRAMUSCULAR | Status: AC
  Administered 2014-01-11: 6.25 mg via INTRAVENOUS
  Filled 2014-01-11: qty 1

## 2014-01-11 MED ORDER — 0.9 % SODIUM CHLORIDE (POUR BTL) OPTIME
TOPICAL | Status: DC | PRN
  Administered 2014-01-11: 1000 mL

## 2014-01-11 MED ORDER — SODIUM CHLORIDE 0.9 % IV SOLN: 250.0000 mL | INTRAVENOUS | Status: DC

## 2014-01-11 MED ORDER — MORPHINE SULFATE 2 MG/ML IJ SOLN: 2.0000 mg | Freq: Every morning | INTRAMUSCULAR | Status: DC

## 2014-01-11 SURGICAL SUPPLY — 76 items
ADH SKN CLS LQ APL DERMABOND (GAUZE/BANDAGES/DRESSINGS) ×1
APL SKNCLS STERI-STRIP NONHPOA (GAUZE/BANDAGES/DRESSINGS) ×1
BAG DECANTER FOR FLEXI CONT (MISCELLANEOUS) ×3 IMPLANT
BENZOIN TINCTURE PRP APPL 2/3 (GAUZE/BANDAGES/DRESSINGS) ×3 IMPLANT
BLADE CLIPPER SURG (BLADE) IMPLANT
BRUSH SCRUB EZ PLAIN DRY (MISCELLANEOUS) ×3 IMPLANT
CANISTER SUCT 3000ML (MISCELLANEOUS) ×3 IMPLANT
CLOSURE WOUND 1/2 X4 (GAUZE/BANDAGES/DRESSINGS) ×1
CONT SPEC 4OZ CLIKSEAL STRL BL (MISCELLANEOUS) ×3 IMPLANT
CORDS BIPOLAR (ELECTRODE) ×3 IMPLANT
DECANTER SPIKE VIAL GLASS SM (MISCELLANEOUS) ×1 IMPLANT
DERMABOND ADHESIVE PROPEN (GAUZE/BANDAGES/DRESSINGS) ×2
DERMABOND ADVANCED .7 DNX6 (GAUZE/BANDAGES/DRESSINGS) IMPLANT
DRAPE LAPAROTOMY 100X72X124 (DRAPES) ×3 IMPLANT
DRAPE POUCH INSTRU U-SHP 10X18 (DRAPES) ×3 IMPLANT
DRAPE SURG 17X23 STRL (DRAPES) IMPLANT
DRSG OPSITE 4X5.5 SM (GAUZE/BANDAGES/DRESSINGS) ×3 IMPLANT
DRSG OPSITE POSTOP 4X6 (GAUZE/BANDAGES/DRESSINGS) ×2 IMPLANT
ELECT REM PT RETURN 9FT ADLT (ELECTROSURGICAL) ×3
ELECTRODE REM PT RTRN 9FT ADLT (ELECTROSURGICAL) ×1 IMPLANT
EVACUATOR 1/8 PVC DRAIN (DRAIN) IMPLANT
EVACUATOR 3/16  PVC DRAIN (DRAIN) ×2
EVACUATOR 3/16 PVC DRAIN (DRAIN) IMPLANT
GAUZE SPONGE 4X4 12PLY STRL (GAUZE/BANDAGES/DRESSINGS) ×3 IMPLANT
GAUZE SPONGE 4X4 16PLY XRAY LF (GAUZE/BANDAGES/DRESSINGS) IMPLANT
GLOVE BIO SURGEON STRL SZ 6.5 (GLOVE) IMPLANT
GLOVE BIO SURGEON STRL SZ7 (GLOVE) IMPLANT
GLOVE BIO SURGEON STRL SZ7.5 (GLOVE) IMPLANT
GLOVE BIO SURGEON STRL SZ8 (GLOVE) ×3 IMPLANT
GLOVE BIO SURGEON STRL SZ8.5 (GLOVE) IMPLANT
GLOVE BIO SURGEONS STRL SZ 6.5 (GLOVE)
GLOVE BIOGEL M 8.0 STRL (GLOVE) IMPLANT
GLOVE BIOGEL PI IND STRL 7.5 (GLOVE) IMPLANT
GLOVE BIOGEL PI INDICATOR 7.5 (GLOVE) ×4
GLOVE ECLIPSE 6.5 STRL STRAW (GLOVE) IMPLANT
GLOVE ECLIPSE 7.0 STRL STRAW (GLOVE) IMPLANT
GLOVE ECLIPSE 7.5 STRL STRAW (GLOVE) ×4 IMPLANT
GLOVE ECLIPSE 8.0 STRL XLNG CF (GLOVE) IMPLANT
GLOVE ECLIPSE 8.5 STRL (GLOVE) IMPLANT
GLOVE EXAM NITRILE LRG STRL (GLOVE) IMPLANT
GLOVE EXAM NITRILE MD LF STRL (GLOVE) IMPLANT
GLOVE EXAM NITRILE XL STR (GLOVE) IMPLANT
GLOVE EXAM NITRILE XS STR PU (GLOVE) IMPLANT
GLOVE INDICATOR 6.5 STRL GRN (GLOVE) IMPLANT
GLOVE INDICATOR 7.0 STRL GRN (GLOVE) IMPLANT
GLOVE INDICATOR 7.5 STRL GRN (GLOVE) IMPLANT
GLOVE INDICATOR 8.0 STRL GRN (GLOVE) IMPLANT
GLOVE INDICATOR 8.5 STRL (GLOVE) ×3 IMPLANT
GLOVE OPTIFIT SS 8.0 STRL (GLOVE) IMPLANT
GLOVE SURG SS PI 6.5 STRL IVOR (GLOVE) IMPLANT
GOWN STRL REUS W/ TWL LRG LVL3 (GOWN DISPOSABLE) ×1 IMPLANT
GOWN STRL REUS W/ TWL XL LVL3 (GOWN DISPOSABLE) ×1 IMPLANT
GOWN STRL REUS W/TWL 2XL LVL3 (GOWN DISPOSABLE) ×1 IMPLANT
GOWN STRL REUS W/TWL LRG LVL3 (GOWN DISPOSABLE)
GOWN STRL REUS W/TWL XL LVL3 (GOWN DISPOSABLE) ×6
KIT BASIN OR (CUSTOM PROCEDURE TRAY) ×3 IMPLANT
KIT ROOM TURNOVER OR (KITS) ×3 IMPLANT
LIQUID BAND (GAUZE/BANDAGES/DRESSINGS) ×1 IMPLANT
NDL HYPO 25X1 1.5 SAFETY (NEEDLE) IMPLANT
NEEDLE HYPO 25X1 1.5 SAFETY (NEEDLE) ×3 IMPLANT
NS IRRIG 1000ML POUR BTL (IV SOLUTION) ×3 IMPLANT
PACK LAMINECTOMY NEURO (CUSTOM PROCEDURE TRAY) ×3 IMPLANT
PATTIES SURGICAL .75X.75 (GAUZE/BANDAGES/DRESSINGS) IMPLANT
SPONGE SURGIFOAM ABS GEL SZ50 (HEMOSTASIS) ×3 IMPLANT
STRIP CLOSURE SKIN 1/2X4 (GAUZE/BANDAGES/DRESSINGS) ×2 IMPLANT
SUT VIC AB 0 CT1 18XCR BRD8 (SUTURE) ×1 IMPLANT
SUT VIC AB 0 CT1 8-18 (SUTURE) ×3
SUT VIC AB 2-0 CT1 18 (SUTURE) ×3 IMPLANT
SUT VICRYL 4-0 PS2 18IN ABS (SUTURE) ×3 IMPLANT
SWAB CULTURE LIQ STUART DBL (MISCELLANEOUS) ×5 IMPLANT
SYR 20ML ECCENTRIC (SYRINGE) ×3 IMPLANT
SYR CONTROL 10ML LL (SYRINGE) IMPLANT
TOWEL OR 17X24 6PK STRL BLUE (TOWEL DISPOSABLE) ×3 IMPLANT
TOWEL OR 17X26 10 PK STRL BLUE (TOWEL DISPOSABLE) ×3 IMPLANT
TUBE ANAEROBIC SPECIMEN COL (MISCELLANEOUS) ×5 IMPLANT
WATER STERILE IRR 1000ML POUR (IV SOLUTION) ×3 IMPLANT

## 2014-01-11 NOTE — H&P (Signed)
Angie Olson is an 63 y.o. female.   Chief Complaint: Back pain and right leg radicular radiculopathy and pain in her heel and foot HPI: Patient is a 63 year old female who is approximately 3-4 weeks ago underwent decompressive laminectomy and fusion L5-S1. Postop patient initially did very well however the last 2 weeks of progressive worsening back and probably right leg pain rated the back of her leg back INTO her heel above her foot swells occasionally in the top foot with numbness tingling sensation this was felt to be consistent with both an L5 and S1 nerve root pattern. Workup revealed a fluid collection with mass effect on the thecal sac at L5-S1 and due to patient progressive clinical syndrome fluid collection consistent with an epidural hematoma I recommended reexploration of lumbar wound for evacuation of fluid collection. I will be protected and obtaining cultures. I extensively over the risks and benefits of the operation with the patient as well as perioperative Course and expectations of outcome and alternatives of surgery and she understood and agreed to proceed forward.  Past Medical History  Diagnosis Date  . Hypertension   . Acid reflux   . Bronchitis     CHRONIC   . Anxiety     PT'S SON DIED 2010-05-17  . Pneumonia     X 3  - LAST TIME WAS 05-18-11  . H/O hiatal hernia   . Arthritis     RA AND OA  . Pain     RIGHT KNEE - TORN MENISCUS AND ACL  . Stroke 03/2011    TIA--PT EXPERIENCED NUMBNESS RT HAND AND ARM , HEADACHE AND NAUSEA. NO RESIDUAL PROBLEMS  . Borderline hypercholesterolemia     on medication for TIA  . Environmental allergies   . PONV (postoperative nausea and vomiting) 09-2012    sore throat after ankle surgery 6 yrs ago and severe ponv 09-2012  . Allergic rhinitis   . GERD (gastroesophageal reflux disease)     on PPI and carafte prn  . IBS (irritable bowel syndrome)   . LBP (low back pain)   . Pulmonary nodule seen on imaging study   . OA (osteoarthritis)    Left Shoulder  . Colon polyp   . Shortness of breath     with activity,bronchitis    Past Surgical History  Procedure Laterality Date  . Ankle surgery  ~10 years ago    left  . Shoulder surgery Left ~14 years ago    "scope"  . Thumb surgery Left ~14 years ago    x2  . Colonoscopy  [id number], 05-18-2011  . Trigger finger release  ~6 years ago    X2 ON RIGHT HAND  . Knee arthroscopy Right 10/13/2012    Procedure: RIGHT KNEE ARTHROSCOPY WITH DEBRIDEMENT, CHONDROPLASTY;  Surgeon: Gearlean Alf, MD;  Location: [facility];  Service: Orthopedics;  Laterality: Right;  . Back surgery  05/17/2009    lower  . Knee arthroscopy Right 03/02/2013    Procedure: RIGHT ARTHROSCOPY KNEE WITH MEDIAL MENISCAL  DEBRIDEMENT;  Surgeon: Gearlean Alf, MD;  Location: [facility];  Service: Orthopedics;  Laterality: Right;  . Cholecystectomy  ~15 years ago  . Root canal    . Tubal ligation    . Total knee arthroplasty Right 07/25/2013    Procedure: RIGHT TOTAL KNEE ARTHROPLASTY;  Surgeon: Gearlean Alf, MD;  Location: [facility];  Service: Orthopedics;  Laterality: Right;  . Maximum access (mas)posterior lumbar interbody fusion (plif) 1 level N/A 12/05/2013  Procedure: FOR MAXIMUM ACCESS (MAS) POSTERIOR LUMBAR INTERBODY FUSION (PLIF)LUMBAR FIVE-SACRAL-ONE,REMOVAL HARDWARE LUMBAR FOUR-FIVE;  Surgeon: Elaina Hoops, MD;  Location: [facility];  Service: Neurosurgery;  Laterality: N/A;    Family History  Problem Relation Age of Onset  . Colon cancer Neg Hx   . Heart disease Sister   . Lung cancer Brother   . Heart disease Brother   . CAD Brother   . Heart attack Mother   . Emphysema Father   . Hypertension Daughter    Social History:  reports that she has never smoked. She has never used smokeless tobacco. She reports that she drinks alcohol. She reports that she does not use illicit drugs.  Allergies:  Allergies  Allergen Reactions  . Methotrexate Derivatives     Recurrent infection  . Pseudoephedrine     Makes  jittery   . Vicodin [Hydrocodone-Acetaminophen]     "Makes me feel weird"    Medications Prior to Admission  Medication Sig Dispense Refill  . ALPRAZolam (XANAX) 1 MG tablet Take 0.5-1 mg by mouth at bedtime.       Marland Kitchen atorvastatin (LIPITOR) 20 MG tablet Take 20 mg by mouth every morning.       . cetirizine (ZYRTEC) 10 MG tablet Take 10 mg by mouth daily.      . cyclobenzaprine (FLEXERIL) 10 MG tablet Take 10 mg by mouth 3 (three) times daily as needed for muscle spasms.      . diclofenac sodium (VOLTAREN) 1 % GEL Apply 2 g topically 4 (four) times daily as needed (pain).       . DULERA 200-5 MCG/ACT AERO Inhale 2 puffs into the lungs 2 (two) times daily.       Marland Kitchen esomeprazole (NEXIUM) 40 MG capsule Take 40 mg by mouth daily before breakfast.      . gabapentin (NEURONTIN) 300 MG capsule Take 300 mg by mouth daily.      . hydrochlorothiazide (HYDRODIURIL) 25 MG tablet Take 25 mg by mouth every morning.       . leflunomide (ARAVA) 20 MG tablet Take 20 mg by mouth daily.      . montelukast (SINGULAIR) 10 MG tablet Take 10 mg by mouth every evening.      . Olopatadine HCl (PATANASE) 0.6 % SOLN Place 1 puff into the nose daily.      Marland Kitchen oxyCODONE-acetaminophen (PERCOCET/ROXICET) 5-325 MG per tablet Take 1 tablet by mouth every 4 (four) hours as needed for moderate pain or severe pain.      . predniSONE (DELTASONE) 5 MG tablet Take 5-10 mg by mouth daily. Will take 1-2 tablets at a time depending on inflammation      . sucralfate (CARAFATE) 1 G tablet Take 1 g by mouth 2 (two) times daily as needed (for indigestion).        Results for orders placed during the hospital encounter of 01/11/14 (from the past 48 hour(s))  BASIC METABOLIC PANEL     Status: Abnormal   Collection Time    01/11/14  1:45 PM      Result Value Ref Range   Sodium 142  137 - 147 mEq/L   Potassium 3.4 (*) 3.7 - 5.3 mEq/L   Chloride 99  96 - 112 mEq/L   CO2 28  19 - 32 mEq/L   Glucose, Bld 98  70 - 99 mg/dL   BUN 15  6 - 23  mg/dL   Creatinine, Ser 0.69  0.50 - 1.10 mg/dL  Calcium 9.3  8.4 - 10.5 mg/dL   GFR calc non Af Amer >90  >90 mL/min   GFR calc Af Amer >90  >90 mL/min   Comment: (NOTE)     The eGFR has been calculated using the CKD EPI equation.     This calculation has not been validated in all clinical situations.     eGFR's persistently <90 mL/min signify possible Chronic Kidney     Disease.   Anion gap 15  5 - 15  CBC     Status: Abnormal   Collection Time    01/11/14  1:45 PM      Result Value Ref Range   WBC 6.7  4.0 - 10.5 K/uL   RBC 3.91  3.87 - 5.11 MIL/uL   Hemoglobin 10.0 (*) 12.0 - 15.0 g/dL   HCT 31.9 (*) 36.0 - 46.0 %   MCV 81.6  78.0 - 100.0 fL   MCH 25.6 (*) 26.0 - 34.0 pg   MCHC 31.3  30.0 - 36.0 g/dL   RDW 15.2  11.5 - 15.5 %   Platelets 326  150 - 400 K/uL   Ct Lumbar Spine Wo Contrast  01/11/2014   CLINICAL DATA:  Recent L5-S1 fusion, with worsening back and LEFT leg pain.  EXAM: CT LUMBAR SPINE WITHOUT CONTRAST  TECHNIQUE: Multidetector CT imaging of the lumbar spine was performed without intravenous contrast administration. Multiplanar CT image reconstructions were also generated.  COMPARISON:  MRI lumbar spine 01/04/2014.  FINDINGS: Segmentation: Normal.  Five lumbar type vertebral bodies.  Alignment:  Normal.  Vertebrae: Prior L4-5 XLIF fusion is solid. The screws have been removed at time of the last procedure from L4. New posterior and interbody fusion at L5-S1. Endplate sclerotic changes at L5 and S1. Moderate subsidence of the cages into L5. Possible screw loosening S1 on the RIGHT.  Paraspinal tissues: Unremarkable.  Disc levels:  L4-5:  Solid fusion.  No residual impingement.  L5-S1: The postoperative fluid collection demonstrated on MR is better visualized on that study. Some gas is seen in the RIGHT lateral spinal canal directly ventral to the RIGHT S1 screw head, probably emanating from the disc space. Neural foramina, subarticular zones, and spinal canal are poorly  visualized on this noncontrast exam.  IMPRESSION: Solid L4-5 fusion.  Early subsidence of the interbody cages at L5-S1 with endplate reactive changes, and possible early RIGHT S1 screw loosening.  Postoperative fluid collection identified on MR at L5-S1 is poorly visualized on today's CT. Presence or absence of thecal sac compression or neural impingement is not established on this exam.   Electronically Signed   By: Rolla Flatten M.D.   On: 01/11/2014 13:43    Review of Systems  Constitutional: Negative.   HENT: Negative.   Eyes: Negative.   Respiratory: Negative.   Cardiovascular: Negative.   Gastrointestinal: Negative.   Genitourinary: Negative.   Musculoskeletal: Positive for back pain and joint pain.  Skin: Negative.   Neurological: Positive for tingling and sensory change.  Endo/Heme/Allergies: Negative.   Psychiatric/Behavioral: Negative.     Blood pressure 190/96, pulse 105, temperature 98.7 F (37.1 C), temperature source Oral, resp. rate 20, SpO2 97.00%. Physical Exam  Constitutional: She is oriented to person, place, and time. She appears well-developed and well-nourished.  HENT:  Head: Normocephalic.  Eyes: Pupils are equal, round, and reactive to light.  Neck: Normal range of motion.  Respiratory: Effort normal.  GI: Soft.  Neurological: She is alert and oriented to person, place, and  time. She has normal strength.  Reflex Scores:      Tricep reflexes are 2+ on the right side and 2+ on the left side.      Bicep reflexes are 2+ on the right side and 2+ on the left side.      Brachioradialis reflexes are 2+ on the right side and 2+ on the left side.      Patellar reflexes are 2+ on the right side and 2+ on the left side.      Achilles reflexes are 2+ on the right side and 2+ on the left side. Strength is 5-5 in her iliopsoas, quads, hamstrings, gastroc, and tibialis, EHL.  Skin: Skin is warm and dry.     Assessment/Plan This 63 year old female presents for a  reexploration for lumbar wound for evacuation of hematoma.  Jahden Schara P 01/11/2014, 3:08 PM

## 2014-01-11 NOTE — Anesthesia Postprocedure Evaluation (Signed)
  Anesthesia Post-op Note  Patient: Angie Olson  Procedure(s) Performed: Procedure(s): Irrigation and Debridement Lumbar Wound for hematoma (N/A)  Patient Location: PACU  Anesthesia Type: General   Level of Consciousness: awake, alert  and oriented  Airway and Oxygen Therapy: Patient Spontanous Breathing  Post-op Pain: mild  Post-op Assessment: Post-op Vital signs reviewed  Post-op Vital Signs: Reviewed  Last Vitals:  Filed Vitals:   01/11/14 1338  BP: 190/96  Pulse: 105  Temp: 37.1 C  Resp: 20    Complications: No apparent anesthesia complications

## 2014-01-11 NOTE — Transfer of Care (Signed)
Immediate Anesthesia Transfer of Care Note  Patient: Angie Olson  Procedure(s) Performed: Procedure(s): Irrigation and Debridement Lumbar Wound for hematoma (N/A)  Patient Location: PACU  Anesthesia Type:General  Level of Consciousness: awake, alert  and oriented  Airway & Oxygen Therapy: Patient Spontanous Breathing  Post-op Assessment: Report given to PACU RN  Post vital signs: Reviewed and stable  Complications: No apparent anesthesia complications

## 2014-01-11 NOTE — Op Note (Signed)
Preoperative diagnosis: Epidural hematoma with bilateral L5 and S1 radiculopathies  Postoperative diagnosis: Same  Procedure: Reexploration of lumbar wound for I&D of epidural hematoma L5-S1  Surgeon: Francesca Jewett  Anesthesia: Gen.  EBL: Minimal  History of present illness: Patient is a 63 year old female who is about 3-4 weeks status post L5-S1 fusion who is doing very well initially but started developing progressive worsening back and bilateral leg pain remained down predominantly to the S1 nerve pattern but also across the foot consistent with L5. Workup revealed a fluid collection causing mass effect thecal sac so I recommended I and D of lumbar wound extensively went over the risks and benefits of the operation with the patient as well as perioperative course expectations of outcome and alternatives of surgery and she understands and agrees to proceed forward.  Operative procedure: Patient brought into the OR was induced under general anesthesia and positioned prone on Wilson frame back was prepped and draped in routine sterile fashion. Prior to administering any IV antibiotics I opened up the incision and dissected through the scar tissue and down the epidural space and took multiple cultures of the fluid within the epidural space. After cultures were obtained I gave her IV Ancef per the anesthesiologist. Then I freed up the scar tissue exposed hardware and then I removed the overlying layer Gelfoam identified the L5 and S1 nerve roots bilaterally identified the foramen debridement abundant necrotic material that had overgrown and scarred around both sets of nerve roots none of the fluid appeared purulent at all appeared to be old hematoma. However there did appear to be an excessive amount of inflammatory reaction around the nerve roots. At the end of the I and D there was no further stenosis on the thecal sac or easy either L5 or S1 nerve roots on either side. All the foramina easily accepted the  coronary angle and nerve hook. The implants were all in good position. The wounds encompassing irrigated and meticulous hemostasis was maintained large drain was placed Gelfoam was overlaid over the dura and the wound was closed in layers with interrupted Vicryl and skin is closed running 4 subcuticular benzoin and Steri-Strips were applied and patient went covered in stable condition. At the end the case all needle counts and sponge counts were correct.

## 2014-01-11 NOTE — Anesthesia Preprocedure Evaluation (Addendum)
Anesthesia Evaluation  Patient identified by MRN, date of birth, ID band Patient awake    Reviewed: Allergy & Precautions, H&P , NPO status , Patient's Chart, lab work & pertinent test results  History of Anesthesia Complications (+) PONV and history of anesthetic complications  Airway Mallampati: II  TM Distance: >3 FB Neck ROM: Full    Dental  (+) Chipped, Dental Advisory Given, Missing   Pulmonary shortness of breath, asthma ,  breath sounds clear to auscultation        Cardiovascular hypertension, Pt. on medications - anginaRhythm:Regular Rate:Normal     Neuro/Psych Chronic back pain: narcotics TIA   GI/Hepatic Neg liver ROS, GERD-  Medicated and Controlled,  Endo/Other  Morbid obesity  Renal/GU negative Renal ROS     Musculoskeletal   Abdominal (+) + obese,   Peds  Hematology  (+) Blood dyscrasia (Hb 10.0), ,   Anesthesia Other Findings   Reproductive/Obstetrics                            Anesthesia Physical Anesthesia Plan  ASA: III  Anesthesia Plan: General   Post-op Pain Management:    Induction: Intravenous  Airway Management Planned: Oral ETT  Additional Equipment:   Intra-op Plan:   Post-operative Plan: Extubation in OR  Informed Consent: I have reviewed the patients History and Physical, chart, labs and discussed the procedure including the risks, benefits and alternatives for the proposed anesthesia with the patient or authorized representative who has indicated his/her understanding and acceptance.   Dental advisory given and Dental Advisory Given  Plan Discussed with: Surgeon, Anesthesiologist and CRNA  Anesthesia Plan Comments: (Plan routine monitors, GETA)       Anesthesia Quick Evaluation

## 2014-01-12 ENCOUNTER — Observation Stay (HOSPITAL_COMMUNITY): Payer: 59

## 2014-01-12 DIAGNOSIS — Z8673 Personal history of transient ischemic attack (TIA), and cerebral infarction without residual deficits: Secondary | ICD-10-CM | POA: Diagnosis not present

## 2014-01-12 DIAGNOSIS — Y838 Other surgical procedures as the cause of abnormal reaction of the patient, or of later complication, without mention of misadventure at the time of the procedure: Secondary | ICD-10-CM | POA: Diagnosis present

## 2014-01-12 DIAGNOSIS — Z7952 Long term (current) use of systemic steroids: Secondary | ICD-10-CM | POA: Diagnosis not present

## 2014-01-12 DIAGNOSIS — I10 Essential (primary) hypertension: Secondary | ICD-10-CM | POA: Diagnosis present

## 2014-01-12 DIAGNOSIS — G9751 Postprocedural hemorrhage and hematoma of a nervous system organ or structure following a nervous system procedure: Secondary | ICD-10-CM | POA: Diagnosis present

## 2014-01-12 DIAGNOSIS — M79604 Pain in right leg: Secondary | ICD-10-CM | POA: Diagnosis present

## 2014-01-12 DIAGNOSIS — Z96651 Presence of right artificial knee joint: Secondary | ICD-10-CM | POA: Diagnosis present

## 2014-01-12 DIAGNOSIS — M65871 Other synovitis and tenosynovitis, right ankle and foot: Secondary | ICD-10-CM | POA: Diagnosis present

## 2014-01-12 DIAGNOSIS — K219 Gastro-esophageal reflux disease without esophagitis: Secondary | ICD-10-CM | POA: Diagnosis present

## 2014-01-12 DIAGNOSIS — M79609 Pain in unspecified limb: Secondary | ICD-10-CM

## 2014-01-12 DIAGNOSIS — M1990 Unspecified osteoarthritis, unspecified site: Secondary | ICD-10-CM | POA: Diagnosis present

## 2014-01-12 MED ORDER — HYDROMORPHONE HCL 2 MG PO TABS
4.0000 mg | ORAL_TABLET | ORAL | Status: DC | PRN
  Administered 2014-01-12 – 2014-01-14 (×8): 4 mg via ORAL
  Filled 2014-01-12 (×8): qty 2

## 2014-01-12 MED ORDER — GABAPENTIN 300 MG PO CAPS
300.0000 mg | ORAL_CAPSULE | Freq: Three times a day (TID) | ORAL | Status: DC
  Administered 2014-01-12 – 2014-01-14 (×7): 300 mg via ORAL
  Filled 2014-01-12 (×7): qty 1

## 2014-01-12 NOTE — Progress Notes (Signed)
Patient ID: Angie Olson, female   DOB: 1950-05-14, 63 y.o.   MRN: [id number] Still with a lot of nerve pain but does not appear to be as radicular today as it did preoperatively  Strength 5 out of 5 wound clean dry and intact  Will increase her Neurontin will check duplex Doppler as well as x-rays of her right foot.

## 2014-01-12 NOTE — Progress Notes (Signed)
*  Preliminary Results* Bilateral lower extremity venous duplex completed. Bilateral lower extremities are negative for deep vein thrombosis. There is no evidence of Baker's cyst bilaterally.  01/12/2014  Maudry Mayhew, RVT, RDCS, RDMS

## 2014-01-12 NOTE — Progress Notes (Signed)
Chaplain initiated visit with pt. Pt and chaplain explored themes of loss, pain, and identity. Pt has had a very difficult three years and chaplain affirmed pt experience. Chaplain offered prayer and informed pt about chaplain services. Will follow as needed.   01/12/14 1000  Clinical Encounter Type  Visited With Patient  Visit Type Initial;Spiritual support  Consult/Referral To Chaplain  Recommendations Follow Up  Spiritual Encounters  Spiritual Needs Emotional;Grief support;Prayer  Stress Factors  Patient Stress Factors Loss of control;Major life changes;Health changes;Family relationships;Exhausted  Angie Olson, Barbette Hair, Chaplain 01/12/2014 10:21 AM

## 2014-01-13 ENCOUNTER — Encounter (HOSPITAL_COMMUNITY): Payer: Self-pay | Admitting: Neurosurgery

## 2014-01-13 ENCOUNTER — Inpatient Hospital Stay (HOSPITAL_COMMUNITY): Payer: 59

## 2014-01-13 MED ORDER — TOPIRAMATE 25 MG PO TABS
25.0000 mg | ORAL_TABLET | Freq: Every day | ORAL | Status: DC
  Administered 2014-01-13 – 2014-01-14 (×2): 25 mg via ORAL
  Filled 2014-01-13 (×2): qty 1

## 2014-01-13 NOTE — Progress Notes (Signed)
Patient ID: Angie Olson, female   DOB: 1950/07/03, 63 y.o.   MRN: [id number] Back pain manageable no radicular pain and shooting pain down her leg still intrinsic foot pain. Swelling is gone down from yesterday.  Strength 5 out of 5 wound clean dry and intact drain output minimal   Ultrasound negative x-rays negative plan MRI right foot for evaluation of pain in the dorsum of her right foot and toes with negative x-rays

## 2014-01-14 LAB — WOUND CULTURE
CULTURE: NO GROWTH
CULTURE: NO GROWTH

## 2014-01-14 MED ORDER — HYDROMORPHONE HCL 4 MG PO TABS: 4.0000 mg | ORAL_TABLET | ORAL | Status: DC | PRN

## 2014-01-14 MED ORDER — GABAPENTIN 300 MG PO CAPS: 300.0000 mg | ORAL_CAPSULE | Freq: Three times a day (TID) | ORAL | Status: DC

## 2014-01-14 NOTE — Progress Notes (Signed)
Patient ID: Angie Olson, female   DOB: 1951-02-20, 63 y.o.   MRN: [id number] Doing very well since improvement in pain control MRI scan showed tenosynovitis of the right foot this may be extruding factor to her foot pain.  Strength 5 out of 5 wound clean dry and intact  DC home

## 2014-01-14 NOTE — Discharge Summary (Signed)
Physician Discharge Summary  Patient ID: Angie Olson MRN: [id number] DOB/AGE: [birth year] 63 y.o.  Admit date: 01/11/2014 Discharge date: 01/14/2014  Admission Diagnoses: Epidural hematoma  Discharge Diagnoses: Same Active Problems:   Epidural hematoma   Discharged Condition: good  Hospital Course: Patient admitted the hospital underwent reexploration I and D of lumbar wound for epidural hematoma postoperative patient very well recovering the floor on the floor she was ambulating and voiding spontaneously back and leg pain was significantly improved the foot pain persisted. We only underwent x-rays ultrasound which were all negative and an MRI scan of her right foot on the day prior to discharge which showed a tenosynovitis of her EHL tendon. Patient was stabilized will be discharged on Dilaudid for pain gabapentin.  Consults: Significant Diagnostic Studies: Treatments: Reexploration I in the lumbar wound Discharge Exam: Blood pressure 179/90, pulse 97, temperature 98.2 F (36.8 C), temperature source Oral, resp. rate 18, height 5\' 5"  (1.651 m), weight 90.8 kg (200 lb 2.8 oz), SpO2 97.00%. Strength 5 out of 5 wound clean dry and intact  Disposition: Home  Discharge Instructions   Care order/instruction    Complete by:  As directed   D/c hemovac            Medication List         ALPRAZolam 1 MG tablet  Commonly known as:  XANAX  Take 0.5-1 mg by mouth at bedtime.     atorvastatin 20 MG tablet  Commonly known as:  LIPITOR  Take 20 mg by mouth every morning.     cetirizine 10 MG tablet  Commonly known as:  ZYRTEC  Take 10 mg by mouth daily.     cyclobenzaprine 10 MG tablet  Commonly known as:  FLEXERIL  Take 10 mg by mouth 3 (three) times daily as needed for muscle spasms.     diclofenac sodium 1 % Gel  Commonly known as:  VOLTAREN  Apply 2 g topically 4 (four) times daily as needed (pain).     DULERA 200-5 MCG/ACT Aero  Generic drug:   mometasone-formoterol  Inhale 2 puffs into the lungs 2 (two) times daily.     esomeprazole 40 MG capsule  Commonly known as:  NEXIUM  Take 40 mg by mouth daily before breakfast.     gabapentin 300 MG capsule  Commonly known as:  NEURONTIN  Take 300 mg by mouth daily.     gabapentin 300 MG capsule  Commonly known as:  NEURONTIN  Take 1 capsule (300 mg total) by mouth 3 (three) times daily.     hydrochlorothiazide 25 MG tablet  Commonly known as:  HYDRODIURIL  Take 25 mg by mouth every morning.     HYDROmorphone 4 MG tablet  Commonly known as:  DILAUDID  Take 1 tablet (4 mg total) by mouth every 3 (three) hours as needed for severe pain.     leflunomide 20 MG tablet  Commonly known as:  ARAVA  Take 20 mg by mouth daily.     montelukast 10 MG tablet  Commonly known as:  SINGULAIR  Take 10 mg by mouth every evening.     oxyCODONE-acetaminophen 5-325 MG per tablet  Commonly known as:  PERCOCET/ROXICET  Take 1 tablet by mouth every 4 (four) hours as needed for moderate pain or severe pain.     PATANASE 0.6 % Soln  Generic drug:  Olopatadine HCl  Place 1 puff into the nose daily.     predniSONE 5 MG tablet  Commonly known as:  DELTASONE  Take 5-10 mg by mouth daily. Will take 1-2 tablets at a time depending on inflammation     sucralfate 1 G tablet  Commonly known as:  CARAFATE  Take 1 g by mouth 2 (two) times daily as needed (for indigestion).           Follow-up Information   Follow up with [facility] P, MD.   Specialty:  Neurosurgery   Contact information:   [address] [place] [address] [phone]       Signed: Vanessa Kampf P 01/14/2014, 9:45 AM

## 2014-01-14 NOTE — Discharge Instructions (Signed)
No lifting or bending or twisting no driving or riding in a car unless she is going back and forth to see me. Keep incision clean dry and intact.

## 2014-01-16 LAB — ANAEROBIC CULTURE

## 2014-01-19 DIAGNOSIS — M6520 Calcific tendinitis, unspecified site: Secondary | ICD-10-CM | POA: Insufficient documentation

## 2014-06-08 ENCOUNTER — Other Ambulatory Visit: Payer: Self-pay | Admitting: Neurosurgery

## 2014-06-08 DIAGNOSIS — M48061 Spinal stenosis, lumbar region without neurogenic claudication: Secondary | ICD-10-CM

## 2014-06-20 ENCOUNTER — Ambulatory Visit
Admission: RE | Admit: 2014-06-20 | Discharge: 2014-06-20 | Disposition: A | Discharge status: Home / Self Care | Payer: 59 | Source: Ambulatory Visit | Attending: Neurosurgery | Admitting: Neurosurgery

## 2014-06-20 DIAGNOSIS — M25559 Pain in unspecified hip: Secondary | ICD-10-CM | POA: Insufficient documentation

## 2014-06-20 DIAGNOSIS — M48061 Spinal stenosis, lumbar region without neurogenic claudication: Secondary | ICD-10-CM

## 2014-06-23 ENCOUNTER — Other Ambulatory Visit: Payer: Self-pay | Admitting: Neurosurgery

## 2014-06-30 ENCOUNTER — Encounter (HOSPITAL_COMMUNITY)
Admission: RE | Admit: 2014-06-30 | Discharge: 2014-06-30 | Disposition: A | Discharge status: Home / Self Care | Payer: 59 | Source: Ambulatory Visit | Attending: Neurosurgery | Admitting: Neurosurgery

## 2014-06-30 LAB — BASIC METABOLIC PANEL
ANION GAP: 14 (ref 5–15)
BUN: 36 mg/dL — ABNORMAL HIGH (ref 6–23)
CALCIUM: 9.7 mg/dL (ref 8.4–10.5)
CHLORIDE: 101 mmol/L (ref 96–112)
CO2: 24 mmol/L (ref 19–32)
CREATININE: 1.93 mg/dL — AB (ref 0.50–1.10)
GFR, EST AFRICAN AMERICAN: 31 mL/min — AB (ref >90)
GFR, EST NON AFRICAN AMERICAN: 26 mL/min — AB (ref >90)
Glucose, Bld: 133 mg/dL — ABNORMAL HIGH (ref 70–99)
POTASSIUM: 4.4 mmol/L (ref 3.5–5.1)
SODIUM: 139 mmol/L (ref 135–145)

## 2014-06-30 LAB — CBC
HEMATOCRIT: 38.4 % (ref 36.0–46.0)
HEMOGLOBIN: 12.6 g/dL (ref 12.0–15.0)
MCH: 26.4 pg (ref 26.0–34.0)
MCHC: 32.8 g/dL (ref 30.0–36.0)
MCV: 80.5 fL (ref 78.0–100.0)
Platelets: 453 10*3/uL — ABNORMAL HIGH (ref 150–400)
RBC: 4.77 MIL/uL (ref 3.87–5.11)
RDW: 16.1 % — AB (ref 11.5–15.5)
WBC: 16.7 10*3/uL — ABNORMAL HIGH (ref 4.0–10.5)

## 2014-06-30 LAB — TYPE AND SCREEN
ABO/RH(D): A NEG
Antibody Screen: NEGATIVE

## 2014-06-30 LAB — SURGICAL PCR SCREEN
MRSA, PCR: NEGATIVE
Staphylococcus aureus: NEGATIVE

## 2014-06-30 NOTE — Pre-Procedure Instructions (Signed)
Angie Olson  06/30/2014   Your procedure is scheduled on:  July 05, 2014  Report to [facility] Admitting at 6:30 AM.  Call this number if you have problems the morning of surgery: [phone]   Remember:   Do not eat food or drink liquids after midnight.   Take these medicines the morning of surgery with A SIP OF WATER: cetirizine (ZYRTEC), gabapentin (NEURONTIN) , pain pill if needed, promethazine (PHENERGAN) if  needed   STOP NSAIDS (ADVIL, IBUPROFEN), HERBAL SUPPLEMENTS ONE WEEK PRIOR TO SURGERY   Do not wear jewelry, make-up or nail polish.  Do not wear lotions, powders, or perfumes. You may wear deodorant.  Do not shave 48 hours prior to surgery. Men may shave face and neck.  Do not bring valuables to the hospital.  [facility] is not responsible                  for any belongings or valuables.               Contacts, dentures or bridgework may not be worn into surgery.  Leave suitcase in the car. After surgery it may be brought to your room.  For patients admitted to the hospital, discharge time is determined by your                treatment team.               Patients discharged the day of surgery will not be allowed to drive  home.  Name and phone number of your driver: FAMILY/FRIEND  Special Instructions: "PREPARING FOR SURGERY"   Please read over the following fact sheets that you were given: Pain Booklet, Coughing and Deep Breathing, Blood Transfusion Information and Surgical Site Infection Prevention

## 2014-07-03 NOTE — Progress Notes (Addendum)
Anesthesia Chart Review:  Pt is 64 year old female scheduled for L5-S1 posterior lateral fusion on 07/05/2014 with Dr. Saintclair Halsted.   PMH includes: HTN, stroke, hyperlipidemia, GERD. Never smoker. BMI 32. S/p irrigation and debridement lumbar wound 01/11/14, s/p PLIF 12/05/13, s/p R TKA 07/29/13, s/p knee arthroplasty on 03/02/13 and 10/13/12.   Preoperative labs reviewed.  Cr 1.93. All previous cr results normal (last cr was 0.69 five months ago).  WBC 16.7.   Chest x-ray 12/05/2013 reviewed. No active cardiopulmonary disease.   EKG: Sinus tachycardia (108 bpm).   Echo 06/29/2013: Left ventricle: The cavity size was normal. Systolic function was normal. The estimated ejection fraction was in the range of 60% to 65%. Wall motion was normal; there were no regional wall motion abnormalities. Doppler parameters are consistent with abnormal left ventricular relaxation (grade 1 diastolic dysfunction).   Nuclear stress test 06/29/2013: Normal stress nuclear study. LV Ejection Fraction: 66%. LV Wall Motion: NL LV Function; NL Wall Motion.  Carotid duplex 03/31/2011: No evidence of right carotid stenosis. Minimal plaque on the left with estimated less than 50% ICA stenosis.  Discussed case with Dr. Therisa Doyne. Pt will need medical clearance due to abnormal renal function prior to surgery. Notified Manuela Schwartz in Dr. Windy Carina office.   Willeen Cass, FNP-BC [facility]/Anesthesiology Phone: [phone] 07/03/2014 3:14 PM  Addendum: Pt saw PCP Dr. Dorian Heckle with [facility] today. Per Dr. Michail Sermon, pt is ok for surgery with cr of 1.93. She recommends aggressive IV fluids peri-operatively and renal dosing of all medications initiated including antibiotic, if appropriate.   Discussed with Dr. Therisa Doyne. Will recheck cr DOS and pt will be evaluated by her assigned anesthesiologist DOS.   Willeen Cass, FNP-BC [facility] Short Stay Surgical [facility]/Anesthesiology Phone: [phone] 07/04/2014  4:12 PM

## 2014-07-04 MED ORDER — CEFAZOLIN SODIUM-DEXTROSE 2-3 GM-% IV SOLR
2.0000 g | INTRAVENOUS | Status: AC
  Administered 2014-07-05: 2 g via INTRAVENOUS
  Filled 2014-07-04: qty 50

## 2014-07-04 MED ORDER — DEXAMETHASONE SODIUM PHOSPHATE 10 MG/ML IJ SOLN
10.0000 mg | INTRAMUSCULAR | Status: AC
  Administered 2014-07-05: 10 mg via INTRAVENOUS
  Filled 2014-07-04: qty 1

## 2014-07-04 NOTE — Progress Notes (Signed)
Spoke with Manuela Schwartz in Dr. Windy Carina office concerning medical clearance. States pt. Is seeing  Medical doctor today for clearance. Manuela Schwartz was asked to fax Medical Clearance as soon as available.

## 2014-07-05 ENCOUNTER — Inpatient Hospital Stay (HOSPITAL_COMMUNITY): Payer: 59

## 2014-07-05 ENCOUNTER — Encounter (HOSPITAL_COMMUNITY)
Admission: RE | Disposition: A | Discharge status: Home / Self Care | Payer: Self-pay | Source: Ambulatory Visit | Attending: Neurosurgery

## 2014-07-05 ENCOUNTER — Encounter (HOSPITAL_COMMUNITY): Payer: Self-pay | Admitting: *Deleted

## 2014-07-05 ENCOUNTER — Inpatient Hospital Stay (HOSPITAL_COMMUNITY)
Admission: RE | Admit: 2014-07-05 | Discharge: 2014-07-09 | DRG: 460 | Disposition: A | Discharge status: Home / Self Care | Payer: 59 | Source: Ambulatory Visit | Attending: Neurosurgery | Admitting: Neurosurgery

## 2014-07-05 ENCOUNTER — Inpatient Hospital Stay (HOSPITAL_COMMUNITY): Payer: 59 | Admitting: Vascular Surgery

## 2014-07-05 ENCOUNTER — Inpatient Hospital Stay (HOSPITAL_COMMUNITY): Payer: 59 | Admitting: Anesthesiology

## 2014-07-05 DIAGNOSIS — R000 Tachycardia, unspecified: Secondary | ICD-10-CM | POA: Diagnosis present

## 2014-07-05 DIAGNOSIS — M960 Pseudarthrosis after fusion or arthrodesis: Secondary | ICD-10-CM | POA: Diagnosis present

## 2014-07-05 DIAGNOSIS — M1990 Unspecified osteoarthritis, unspecified site: Secondary | ICD-10-CM | POA: Diagnosis present

## 2014-07-05 DIAGNOSIS — M79606 Pain in leg, unspecified: Secondary | ICD-10-CM | POA: Diagnosis present

## 2014-07-05 DIAGNOSIS — F419 Anxiety disorder, unspecified: Secondary | ICD-10-CM | POA: Diagnosis present

## 2014-07-05 DIAGNOSIS — Z419 Encounter for procedure for purposes other than remedying health state, unspecified: Secondary | ICD-10-CM

## 2014-07-05 DIAGNOSIS — I10 Essential (primary) hypertension: Secondary | ICD-10-CM | POA: Diagnosis present

## 2014-07-05 DIAGNOSIS — K589 Irritable bowel syndrome without diarrhea: Secondary | ICD-10-CM | POA: Diagnosis present

## 2014-07-05 DIAGNOSIS — K449 Diaphragmatic hernia without obstruction or gangrene: Secondary | ICD-10-CM | POA: Diagnosis present

## 2014-07-05 DIAGNOSIS — S32009K Unspecified fracture of unspecified lumbar vertebra, subsequent encounter for fracture with nonunion: Secondary | ICD-10-CM | POA: Diagnosis present

## 2014-07-05 DIAGNOSIS — K219 Gastro-esophageal reflux disease without esophagitis: Secondary | ICD-10-CM | POA: Diagnosis present

## 2014-07-05 DIAGNOSIS — Y831 Surgical operation with implant of artificial internal device as the cause of abnormal reaction of the patient, or of later complication, without mention of misadventure at the time of the procedure: Secondary | ICD-10-CM | POA: Diagnosis present

## 2014-07-05 HISTORY — PX: HARVEST BONE GRAFT: SHX377

## 2014-07-05 HISTORY — PX: LUMBAR FUSION: SHX111

## 2014-07-05 LAB — BASIC METABOLIC PANEL
Anion gap: 11 (ref 5–15)
BUN: 35 mg/dL — ABNORMAL HIGH (ref 6–23)
CHLORIDE: 101 mmol/L (ref 96–112)
CO2: 27 mmol/L (ref 19–32)
Calcium: 9.8 mg/dL (ref 8.4–10.5)
Creatinine, Ser: 1.63 mg/dL — ABNORMAL HIGH (ref 0.50–1.10)
GFR, EST AFRICAN AMERICAN: 38 mL/min — AB (ref >90)
GFR, EST NON AFRICAN AMERICAN: 33 mL/min — AB (ref >90)
GLUCOSE: 116 mg/dL — AB (ref 70–99)
POTASSIUM: 4.7 mmol/L (ref 3.5–5.1)
SODIUM: 139 mmol/L (ref 135–145)

## 2014-07-05 LAB — GRAM STAIN

## 2014-07-05 SURGERY — POSTERIOR LUMBAR FUSION 1 LEVEL
Anesthesia: General | Site: Back

## 2014-07-05 MED ORDER — LEFLUNOMIDE 20 MG PO TABS
20.0000 mg | ORAL_TABLET | Freq: Every day | ORAL | Status: DC
  Filled 2014-07-05: qty 1

## 2014-07-05 MED ORDER — MENTHOL 3 MG MT LOZG: 1.0000 | LOZENGE | OROMUCOSAL | Status: DC | PRN

## 2014-07-05 MED ORDER — SODIUM CHLORIDE 0.9 % IV SOLN
250.0000 mL | INTRAVENOUS | Status: DC
  Administered 2014-07-05: 250 mL via INTRAVENOUS

## 2014-07-05 MED ORDER — FLUTICASONE PROPIONATE 50 MCG/ACT NA SUSP
2.0000 | Freq: Two times a day (BID) | NASAL | Status: DC
  Administered 2014-07-05 – 2014-07-09 (×8): 2 via NASAL
  Filled 2014-07-05: qty 16

## 2014-07-05 MED ORDER — ONDANSETRON HCL 4 MG/2ML IJ SOLN
INTRAMUSCULAR | Status: DC | PRN
  Administered 2014-07-05: 4 mg via INTRAVENOUS

## 2014-07-05 MED ORDER — ONDANSETRON HCL 4 MG/2ML IJ SOLN
4.0000 mg | INTRAMUSCULAR | Status: DC | PRN
  Filled 2014-07-05: qty 2

## 2014-07-05 MED ORDER — LORATADINE 10 MG PO TABS
10.0000 mg | ORAL_TABLET | Freq: Every day | ORAL | Status: DC
  Administered 2014-07-06 – 2014-07-09 (×4): 10 mg via ORAL
  Filled 2014-07-05 (×5): qty 1

## 2014-07-05 MED ORDER — VANCOMYCIN HCL 1000 MG IV SOLR
INTRAVENOUS | Status: AC
  Filled 2014-07-05: qty 1000

## 2014-07-05 MED ORDER — SUCCINYLCHOLINE CHLORIDE 20 MG/ML IJ SOLN
INTRAMUSCULAR | Status: AC
  Filled 2014-07-05: qty 1

## 2014-07-05 MED ORDER — ACETAMINOPHEN 325 MG PO TABS: 650.0000 mg | ORAL_TABLET | ORAL | Status: DC | PRN

## 2014-07-05 MED ORDER — LACTATED RINGERS IV SOLN
INTRAVENOUS | Status: DC | PRN
  Administered 2014-07-05 (×2): via INTRAVENOUS

## 2014-07-05 MED ORDER — VANCOMYCIN HCL 1000 MG IV SOLR
INTRAVENOUS | Status: DC | PRN
  Administered 2014-07-05: 1000 mg

## 2014-07-05 MED ORDER — OXYCODONE-ACETAMINOPHEN 5-325 MG PO TABS: 1.0000 | ORAL_TABLET | ORAL | Status: DC | PRN

## 2014-07-05 MED ORDER — PROPOFOL 10 MG/ML IV BOLUS
INTRAVENOUS | Status: AC
  Filled 2014-07-05: qty 20

## 2014-07-05 MED ORDER — ACETAMINOPHEN 650 MG RE SUPP: 650.0000 mg | RECTAL | Status: DC | PRN

## 2014-07-05 MED ORDER — NORTRIPTYLINE HCL 25 MG PO CAPS
25.0000 mg | ORAL_CAPSULE | Freq: Every day | ORAL | Status: DC
  Administered 2014-07-05 – 2014-07-08 (×4): 25 mg via ORAL
  Filled 2014-07-05 (×5): qty 1

## 2014-07-05 MED ORDER — VECURONIUM BROMIDE 10 MG IV SOLR
INTRAVENOUS | Status: DC | PRN
  Administered 2014-07-05: 2 mg via INTRAVENOUS
  Administered 2014-07-05: 1 mg via INTRAVENOUS

## 2014-07-05 MED ORDER — SODIUM CHLORIDE 0.9 % IR SOLN
Status: DC | PRN
  Administered 2014-07-05 (×2)

## 2014-07-05 MED ORDER — TRIAMTERENE-HCTZ 37.5-25 MG PO TABS
1.0000 | ORAL_TABLET | Freq: Every day | ORAL | Status: DC
  Administered 2014-07-06 – 2014-07-08 (×3): 1 via ORAL
  Filled 2014-07-05 (×5): qty 1

## 2014-07-05 MED ORDER — 0.9 % SODIUM CHLORIDE (POUR BTL) OPTIME
TOPICAL | Status: DC | PRN
  Administered 2014-07-05: 1000 mL

## 2014-07-05 MED ORDER — ALPRAZOLAM 0.5 MG PO TABS
1.0000 mg | ORAL_TABLET | Freq: Every day | ORAL | Status: DC
  Administered 2014-07-05 – 2014-07-08 (×4): 1 mg via ORAL
  Filled 2014-07-05 (×4): qty 2

## 2014-07-05 MED ORDER — ROCURONIUM BROMIDE 50 MG/5ML IV SOLN
INTRAVENOUS | Status: AC
  Filled 2014-07-05: qty 1

## 2014-07-05 MED ORDER — LIDOCAINE HCL (CARDIAC) 20 MG/ML IV SOLN
INTRAVENOUS | Status: AC
  Filled 2014-07-05: qty 5

## 2014-07-05 MED ORDER — NEOSTIGMINE METHYLSULFATE 10 MG/10ML IV SOLN
INTRAVENOUS | Status: AC
  Filled 2014-07-05: qty 1

## 2014-07-05 MED ORDER — MIDAZOLAM HCL 2 MG/2ML IJ SOLN
INTRAMUSCULAR | Status: AC
  Filled 2014-07-05: qty 2

## 2014-07-05 MED ORDER — SODIUM CHLORIDE 0.9 % IJ SOLN: 3.0000 mL | INTRAMUSCULAR | Status: DC | PRN

## 2014-07-05 MED ORDER — ONDANSETRON HCL 4 MG/2ML IJ SOLN
INTRAMUSCULAR | Status: AC
  Filled 2014-07-05: qty 2

## 2014-07-05 MED ORDER — OXYCODONE-ACETAMINOPHEN 5-325 MG PO TABS
1.0000 | ORAL_TABLET | Freq: Four times a day (QID) | ORAL | Status: DC | PRN
  Administered 2014-07-05 – 2014-07-09 (×10): 1 via ORAL
  Filled 2014-07-05 (×10): qty 1

## 2014-07-05 MED ORDER — SUCRALFATE 1 G PO TABS
1.0000 g | ORAL_TABLET | Freq: Two times a day (BID) | ORAL | Status: DC | PRN
  Filled 2014-07-05: qty 1

## 2014-07-05 MED ORDER — ARTIFICIAL TEARS OP OINT
TOPICAL_OINTMENT | OPHTHALMIC | Status: DC | PRN
  Administered 2014-07-05: 1 via OPHTHALMIC

## 2014-07-05 MED ORDER — CYCLOBENZAPRINE HCL 10 MG PO TABS
10.0000 mg | ORAL_TABLET | Freq: Three times a day (TID) | ORAL | Status: DC | PRN
  Administered 2014-07-05 – 2014-07-08 (×6): 10 mg via ORAL
  Filled 2014-07-05 (×7): qty 1

## 2014-07-05 MED ORDER — ATORVASTATIN CALCIUM 10 MG PO TABS
20.0000 mg | ORAL_TABLET | Freq: Every day | ORAL | Status: DC
  Administered 2014-07-06 – 2014-07-09 (×4): 20 mg via ORAL
  Filled 2014-07-05 (×4): qty 2

## 2014-07-05 MED ORDER — ONDANSETRON HCL 4 MG/2ML IJ SOLN: 4.0000 mg | Freq: Four times a day (QID) | INTRAMUSCULAR | Status: DC | PRN

## 2014-07-05 MED ORDER — BUPIVACAINE HCL (PF) 0.25 % IJ SOLN
INTRAMUSCULAR | Status: DC | PRN
  Administered 2014-07-05: 10 mL

## 2014-07-05 MED ORDER — ETANERCEPT 50 MG/ML ~~LOC~~ SOAJ
50.0000 mg | SUBCUTANEOUS | Status: DC
Start: 2014-07-05 — End: 2014-07-05

## 2014-07-05 MED ORDER — FENTANYL CITRATE (PF) 250 MCG/5ML IJ SOLN
INTRAMUSCULAR | Status: AC
  Filled 2014-07-05: qty 5

## 2014-07-05 MED ORDER — ALUM & MAG HYDROXIDE-SIMETH 200-200-20 MG/5ML PO SUSP
30.0000 mL | Freq: Four times a day (QID) | ORAL | Status: DC | PRN
  Administered 2014-07-05: 30 mL via ORAL
  Filled 2014-07-05 (×2): qty 30

## 2014-07-05 MED ORDER — HYDROMORPHONE HCL 1 MG/ML IJ SOLN
0.5000 mg | INTRAMUSCULAR | Status: DC | PRN
  Administered 2014-07-05 – 2014-07-08 (×13): 1 mg via INTRAVENOUS
  Filled 2014-07-05 (×14): qty 1

## 2014-07-05 MED ORDER — FENTANYL CITRATE (PF) 100 MCG/2ML IJ SOLN
INTRAMUSCULAR | Status: DC | PRN
  Administered 2014-07-05: 100 ug via INTRAVENOUS
  Administered 2014-07-05: 50 ug via INTRAVENOUS
  Administered 2014-07-05: 100 ug via INTRAVENOUS

## 2014-07-05 MED ORDER — SODIUM CHLORIDE 0.9 % IV SOLN
INTRAVENOUS | Status: DC | PRN
  Administered 2014-07-05: 09:00:00 via INTRAVENOUS

## 2014-07-05 MED ORDER — DOCUSATE SODIUM 100 MG PO CAPS
100.0000 mg | ORAL_CAPSULE | Freq: Two times a day (BID) | ORAL | Status: DC
  Administered 2014-07-05 – 2014-07-09 (×8): 100 mg via ORAL
  Filled 2014-07-05 (×8): qty 1

## 2014-07-05 MED ORDER — SODIUM CHLORIDE 0.9 % IJ SOLN
INTRAMUSCULAR | Status: AC
  Filled 2014-07-05: qty 10

## 2014-07-05 MED ORDER — LIDOCAINE-EPINEPHRINE 1 %-1:100000 IJ SOLN
INTRAMUSCULAR | Status: DC | PRN
  Administered 2014-07-05: 10 mL

## 2014-07-05 MED ORDER — THROMBIN 5000 UNITS EX SOLR
CUTANEOUS | Status: DC | PRN
  Administered 2014-07-05 (×4): 5000 [IU] via TOPICAL

## 2014-07-05 MED ORDER — ACETAMINOPHEN 10 MG/ML IV SOLN
INTRAVENOUS | Status: AC
  Administered 2014-07-05: 1000 mg via INTRAVENOUS
  Filled 2014-07-05: qty 100

## 2014-07-05 MED ORDER — LIDOCAINE HCL (CARDIAC) 20 MG/ML IV SOLN
INTRAVENOUS | Status: DC | PRN
  Administered 2014-07-05: 75 mg via INTRAVENOUS

## 2014-07-05 MED ORDER — HYDROMORPHONE HCL 1 MG/ML IJ SOLN
0.2500 mg | INTRAMUSCULAR | Status: DC | PRN
  Administered 2014-07-05 (×2): 0.5 mg via INTRAVENOUS

## 2014-07-05 MED ORDER — GLYCOPYRROLATE 0.2 MG/ML IJ SOLN
INTRAMUSCULAR | Status: AC
  Filled 2014-07-05: qty 4

## 2014-07-05 MED ORDER — PREDNISONE 5 MG PO TABS
10.0000 mg | ORAL_TABLET | Freq: Every day | ORAL | Status: DC
  Administered 2014-07-05 – 2014-07-09 (×5): 10 mg via ORAL
  Filled 2014-07-05 (×5): qty 2

## 2014-07-05 MED ORDER — HYDROMORPHONE HCL 1 MG/ML IJ SOLN
INTRAMUSCULAR | Status: AC
  Administered 2014-07-05: 1 mg
  Filled 2014-07-05: qty 1

## 2014-07-05 MED ORDER — HEMOSTATIC AGENTS (NO CHARGE) OPTIME
TOPICAL | Status: DC | PRN
  Administered 2014-07-05: 1 via TOPICAL

## 2014-07-05 MED ORDER — EPHEDRINE SULFATE 50 MG/ML IJ SOLN
INTRAMUSCULAR | Status: AC
  Filled 2014-07-05: qty 1

## 2014-07-05 MED ORDER — SODIUM CHLORIDE 0.9 % IJ SOLN
3.0000 mL | Freq: Two times a day (BID) | INTRAMUSCULAR | Status: DC
  Administered 2014-07-05 – 2014-07-07 (×6): 3 mL via INTRAVENOUS

## 2014-07-05 MED ORDER — GLYCOPYRROLATE 0.2 MG/ML IJ SOLN
INTRAMUSCULAR | Status: AC
  Filled 2014-07-05: qty 2

## 2014-07-05 MED ORDER — LISINOPRIL 10 MG PO TABS
10.0000 mg | ORAL_TABLET | Freq: Every day | ORAL | Status: DC
  Administered 2014-07-06 – 2014-07-08 (×3): 10 mg via ORAL
  Filled 2014-07-05 (×5): qty 1

## 2014-07-05 MED ORDER — PHENYLEPHRINE 40 MCG/ML (10ML) SYRINGE FOR IV PUSH (FOR BLOOD PRESSURE SUPPORT)
PREFILLED_SYRINGE | INTRAVENOUS | Status: AC
  Filled 2014-07-05: qty 10

## 2014-07-05 MED ORDER — OXYCODONE HCL 5 MG PO TABS: 5.0000 mg | ORAL_TABLET | Freq: Once | ORAL | Status: DC | PRN

## 2014-07-05 MED ORDER — PHENYLEPHRINE HCL 10 MG/ML IJ SOLN
10.0000 mg | INTRAVENOUS | Status: DC | PRN
  Administered 2014-07-05: 20 ug/min via INTRAVENOUS

## 2014-07-05 MED ORDER — ETANERCEPT 50 MG/ML ~~LOC~~ SOAJ: 50.0000 mg | SUBCUTANEOUS | Status: DC

## 2014-07-05 MED ORDER — MIDAZOLAM HCL 5 MG/5ML IJ SOLN
INTRAMUSCULAR | Status: DC | PRN
  Administered 2014-07-05: 2 mg via INTRAVENOUS

## 2014-07-05 MED ORDER — OXYCODONE HCL 5 MG/5ML PO SOLN: 5.0000 mg | Freq: Once | ORAL | Status: DC | PRN

## 2014-07-05 MED ORDER — CEFAZOLIN SODIUM 1-5 GM-% IV SOLN
1.0000 g | Freq: Two times a day (BID) | INTRAVENOUS | Status: AC
  Administered 2014-07-05 – 2014-07-06 (×2): 1 g via INTRAVENOUS
  Filled 2014-07-05 (×3): qty 50

## 2014-07-05 MED ORDER — PANTOPRAZOLE SODIUM 40 MG PO TBEC
40.0000 mg | DELAYED_RELEASE_TABLET | Freq: Every day | ORAL | Status: DC
  Administered 2014-07-05 – 2014-07-09 (×5): 40 mg via ORAL
  Filled 2014-07-05 (×5): qty 1

## 2014-07-05 MED ORDER — ROCURONIUM BROMIDE 100 MG/10ML IV SOLN
INTRAVENOUS | Status: DC | PRN
  Administered 2014-07-05: 50 mg via INTRAVENOUS

## 2014-07-05 MED ORDER — GLYCOPYRROLATE 0.2 MG/ML IJ SOLN
INTRAMUSCULAR | Status: DC | PRN
  Administered 2014-07-05: 6 mg via INTRAVENOUS

## 2014-07-05 MED ORDER — PHENOL 1.4 % MT LIQD: 1.0000 | OROMUCOSAL | Status: DC | PRN

## 2014-07-05 MED ORDER — NEOSTIGMINE METHYLSULFATE 10 MG/10ML IV SOLN
INTRAVENOUS | Status: DC | PRN
  Administered 2014-07-05: 3 mg via INTRAVENOUS

## 2014-07-05 MED ORDER — PROMETHAZINE HCL 25 MG PO TABS
25.0000 mg | ORAL_TABLET | Freq: Two times a day (BID) | ORAL | Status: DC | PRN
Start: 2014-07-05 — End: 2014-07-09

## 2014-07-05 MED ORDER — ARTIFICIAL TEARS OP OINT
TOPICAL_OINTMENT | OPHTHALMIC | Status: AC
  Filled 2014-07-05: qty 3.5

## 2014-07-05 MED ORDER — LEFLUNOMIDE 20 MG PO TABS: 20.0000 mg | ORAL_TABLET | Freq: Every day | ORAL | Status: DC

## 2014-07-05 MED ORDER — PROPOFOL 10 MG/ML IV BOLUS
INTRAVENOUS | Status: DC | PRN
  Administered 2014-07-05: 200 mg via INTRAVENOUS

## 2014-07-05 SURGICAL SUPPLY — 83 items
ADH SKN CLS APL DERMABOND .7 (GAUZE/BANDAGES/DRESSINGS) ×4
APL SKNCLS STERI-STRIP NONHPOA (GAUZE/BANDAGES/DRESSINGS) ×4
BAG DECANTER FOR FLEXI CONT (MISCELLANEOUS) ×4 IMPLANT
BENZOIN TINCTURE PRP APPL 2/3 (GAUZE/BANDAGES/DRESSINGS) ×6 IMPLANT
BLADE CLIPPER SURG (BLADE) IMPLANT
BLADE SURG 11 STRL SS (BLADE) ×4 IMPLANT
BNDG GAUZE ELAST 4 BULKY (GAUZE/BANDAGES/DRESSINGS) ×2 IMPLANT
BRUSH SCRUB EZ PLAIN DRY (MISCELLANEOUS) ×4 IMPLANT
BUR MATCHSTICK NEURO 3.0 LAGG (BURR) ×4 IMPLANT
BUR PRECISION FLUTE 6.0 (BURR) ×4 IMPLANT
CANISTER SUCT 3000ML PPV (MISCELLANEOUS) ×4 IMPLANT
CLOSURE WOUND 1/2 X4 (GAUZE/BANDAGES/DRESSINGS) ×2
CONT SPEC 4OZ CLIKSEAL STRL BL (MISCELLANEOUS) ×8 IMPLANT
COVER BACK TABLE 60X90IN (DRAPES) ×4 IMPLANT
DECANTER SPIKE VIAL GLASS SM (MISCELLANEOUS) ×4 IMPLANT
DERMABOND ADVANCED (GAUZE/BANDAGES/DRESSINGS) ×4
DERMABOND ADVANCED .7 DNX12 (GAUZE/BANDAGES/DRESSINGS) IMPLANT
DRAPE C-ARM 42X72 X-RAY (DRAPES) ×4 IMPLANT
DRAPE C-ARMOR (DRAPES) ×4 IMPLANT
DRAPE INCISE IOBAN 66X45 STRL (DRAPES) IMPLANT
DRAPE LAPAROTOMY 100X72X124 (DRAPES) ×4 IMPLANT
DRAPE POUCH INSTRU U-SHP 10X18 (DRAPES) ×4 IMPLANT
DRAPE PROXIMA HALF (DRAPES) IMPLANT
DRAPE SURG 17X23 STRL (DRAPES) ×4 IMPLANT
DRSG OPSITE 4X5.5 SM (GAUZE/BANDAGES/DRESSINGS) ×4 IMPLANT
DRSG OPSITE POSTOP 3X4 (GAUZE/BANDAGES/DRESSINGS) ×2 IMPLANT
DRSG OPSITE POSTOP 4X6 (GAUZE/BANDAGES/DRESSINGS) ×2 IMPLANT
DURAPREP 26ML APPLICATOR (WOUND CARE) ×4 IMPLANT
ELECT BLADE 4.0 EZ CLEAN MEGAD (MISCELLANEOUS) ×4
ELECT REM PT RETURN 9FT ADLT (ELECTROSURGICAL) ×4
ELECTRODE BLDE 4.0 EZ CLN MEGD (MISCELLANEOUS) IMPLANT
ELECTRODE REM PT RTRN 9FT ADLT (ELECTROSURGICAL) ×2 IMPLANT
EVACUATOR 1/8 PVC DRAIN (DRAIN) ×2 IMPLANT
EVACUATOR 3/16  PVC DRAIN (DRAIN)
EVACUATOR 3/16 PVC DRAIN (DRAIN) ×2 IMPLANT
GAUZE SPONGE 4X4 12PLY STRL (GAUZE/BANDAGES/DRESSINGS) ×4 IMPLANT
GAUZE SPONGE 4X4 16PLY XRAY LF (GAUZE/BANDAGES/DRESSINGS) IMPLANT
GLOVE BIO SURGEON STRL SZ8 (GLOVE) ×8 IMPLANT
GLOVE ECLIPSE 7.5 STRL STRAW (GLOVE) IMPLANT
GLOVE EXAM NITRILE LRG STRL (GLOVE) IMPLANT
GLOVE EXAM NITRILE MD LF STRL (GLOVE) IMPLANT
GLOVE EXAM NITRILE XL STR (GLOVE) IMPLANT
GLOVE EXAM NITRILE XS STR PU (GLOVE) IMPLANT
GLOVE INDICATOR 7.5 STRL GRN (GLOVE) ×2 IMPLANT
GLOVE INDICATOR 8.5 STRL (GLOVE) ×8 IMPLANT
GLOVE SS N UNI LF 7.0 STRL (GLOVE) ×6 IMPLANT
GOWN STRL REUS W/ TWL LRG LVL3 (GOWN DISPOSABLE) IMPLANT
GOWN STRL REUS W/ TWL XL LVL3 (GOWN DISPOSABLE) ×4 IMPLANT
GOWN STRL REUS W/TWL 2XL LVL3 (GOWN DISPOSABLE) IMPLANT
GOWN STRL REUS W/TWL LRG LVL3 (GOWN DISPOSABLE) ×4
GOWN STRL REUS W/TWL XL LVL3 (GOWN DISPOSABLE) ×8
KIT BASIN OR (CUSTOM PROCEDURE TRAY) ×4 IMPLANT
KIT INFUSE XX SMALL 0.7CC (Orthopedic Implant) ×2 IMPLANT
KIT ROOM TURNOVER OR (KITS) ×4 IMPLANT
LIQUID BAND (GAUZE/BANDAGES/DRESSINGS) ×4 IMPLANT
NDL HYPO 25X1 1.5 SAFETY (NEEDLE) ×2 IMPLANT
NEEDLE HYPO 25X1 1.5 SAFETY (NEEDLE) ×4 IMPLANT
NS IRRIG 1000ML POUR BTL (IV SOLUTION) ×4 IMPLANT
PACK LAMINECTOMY NEURO (CUSTOM PROCEDURE TRAY) ×4 IMPLANT
PAD ARMBOARD 7.5X6 YLW CONV (MISCELLANEOUS) ×12 IMPLANT
PATTIES SURGICAL 1X1 (DISPOSABLE) ×2 IMPLANT
PUTTY BONE DBX 5CC MIX (Putty) ×2 IMPLANT
ROD 35MM (Rod) ×2 IMPLANT
ROD 5.5X40MM (Rod) ×2 IMPLANT
SCREW LOCK (Screw) ×16 IMPLANT
SCREW LOCK FXNS SPNE MAS PL (Screw) IMPLANT
SCREW PRECEPT POLY 7.5X35MM (Screw) ×2 IMPLANT
SCREW PRECEPT POLYAXIAL 7.5X40 (Screw) ×2 IMPLANT
SPONGE LAP 4X18 X RAY DECT (DISPOSABLE) IMPLANT
SPONGE SURGIFOAM ABS GEL 100 (HEMOSTASIS) ×4 IMPLANT
SPONGE SURGIFOAM ABS GEL SZ50 (HEMOSTASIS) ×2 IMPLANT
STRIP CLOSURE SKIN 1/2X4 (GAUZE/BANDAGES/DRESSINGS) ×6 IMPLANT
SUT VIC AB 0 CT1 18XCR BRD8 (SUTURE) ×4 IMPLANT
SUT VIC AB 0 CT1 8-18 (SUTURE) ×12
SUT VIC AB 2-0 CT1 18 (SUTURE) ×6 IMPLANT
SUT VICRYL 4-0 PS2 18IN ABS (SUTURE) ×6 IMPLANT
SWAB CULTURE LIQ STUART DBL (MISCELLANEOUS) ×2 IMPLANT
SYR 20ML ECCENTRIC (SYRINGE) ×4 IMPLANT
TOWEL OR 17X24 6PK STRL BLUE (TOWEL DISPOSABLE) ×4 IMPLANT
TOWEL OR 17X26 10 PK STRL BLUE (TOWEL DISPOSABLE) ×4 IMPLANT
TRAY FOLEY CATH 14FRSI W/METER (CATHETERS) ×4 IMPLANT
TUBE ANAEROBIC SPECIMEN COL (MISCELLANEOUS) ×2 IMPLANT
WATER STERILE IRR 1000ML POUR (IV SOLUTION) ×4 IMPLANT

## 2014-07-05 NOTE — Op Note (Signed)
Preoperative diagnosis: Pseudarthrosis L5-S1  Postoperative diagnosis: Same  Procedure: #1 a reexploration of fusion removal of hardware L5-S1  #2 through separate skin incision harvesting iliac crest bone graft from the right posterior superior iliac spine  #3 removal replacement of S1 pedicle screws utilizing 7.5 x 35 on the right 7.5 x 40 on the left and pedicle screw fixation L5-S1 using the nuvasive pedicle screw set  #4 posterior lateral arthrodesis using iliac crest bone graft mixed with BMP and DBX mix L5-S1  Placement of a medium neck drain  Surgeon: Dominica Severin Saket Hellstrom  Asst.: Dayton Bailiff  Anesthesia: Gen.  EBL: Minimal  History of present illness: Patient is a very pleasant 64 year female who 7 months ago underwent L5-S1 posterior lumbar interbody fusion the patient had persistent back pain and progressive worsening back pain workup revealed loosening of her S1 screws consistent with pseudoarthrosis at L5-S1. And due to patient's failure conservative treatment imaging findings and progression of clinical syndrome I recommended reexploration fusion and removal of hardware L5-S1 with iliac crest bone graft. I extensively went over the risks and benefits of the operation with the patient as well as perioperative course expectations of outcome and alternatives of surgery and she understood and agreed to proceed forward.  Operative procedure: Patient brought into the or was induced under general anesthesia positioned prone the Wilson frame her back was prepped and draped in routine sterile fashion her old incision was opened up and extended slightly caudally scar tissues dissected free and dissection was carried out along the hardware the hardware was identified and the nuts removed rods removed the S1 screws were noted to be loose and were removed bilaterally. I then exposed the TPs at L5 lateral facet complexes and sacrum and sacral ala and then packed this way and took attention then to  harvesting of the iliac crest bone graft. Through a separate skin incision on the right posterior superior lack spine made an incision dissected along the posterior superior iliac spine these come a standard gouges and chisels harvested cancellous crest bone graft. After adequate amount of bone graft been harvested wound scope see her good fixing space was maintained and the wounds closed in layers with after Vicryl running 4 subcuticular in the skin. Dermabond benzo and Steri-Strips are applied tensions and taken back to the midline incision. After copious irrigation and aggressive decortication pilot holes were drilled in the right S1 pedicle and I replaced a standard pedicle screw on the right utilizing the awl probed 55 tap followed by 65 tap followed by 7 5 x 35 screw. AP lateral fluoroscopy confirmed good position of this implant. Then on the left side the left S1 screw looked like it captured more the sacral ala however was excellent purchase and AP and lateral fluoroscopy confirmed this to be more of an alar screw but due to its solid purchase in bone I elected to leave behind and then I packed an aggressive amount of iliac crest along the TPs to the sacral ala as well as BMP and DBX mix. Rods were then attached top tightness tightened in place a medium Hemovac drain was placed and wasn't pattern was used to irrigate the wound then the wounds closed in layers with Vicryl skin was closed running 4 subcuticular Dermabond benzo and Steri-Strips were applied sterile dressings applied patient recovered in stable condition. At the end of case on it counts sponge counts were correct.

## 2014-07-05 NOTE — Progress Notes (Signed)
PT Cancellation Note  Patient Details Name: Angie Olson MRN: [id number] DOB: 20-Dec-1950   Cancelled Treatment:    Reason Eval/Treat Not Completed: Pain limiting ability to participate. New admit to floor, limiting pain at this time, nsg ask to hold til am.   Duncan Dull 07/05/2014, 2:55 PM Alben Deeds, [place] DPT  [phone]

## 2014-07-05 NOTE — H&P (Signed)
Angie Olson is an 64 y.o. female.   Chief Complaint: Back and right hip and leg pain HPI: Patient is a 64 year old female who previously undergone an L5-S1 posterior lumbar interbody fusion however the bone graft did not completely fuse and she developed loosening of her S1 screws bilaterally worse on the right. Patient is a persistent posterior right back pain and right hip pain and occasional pain it radiates down her leg. This is refractory to all forms of conservative treatment imaging revealing loosening of her S1 screws and inadequate and an incomplete fusion in the interbody at L5-S1 I recommended revision of fusion with replacement of her S1 screws bilaterally and posterior lateral fusion with iliac crest bone graft. I've extensively gone over the risks and benefits of the operation with the patient as well as perioperative course expectations of outcome and alternatives of surgery and she understands and agrees to proceed forward.  Past Medical History  Diagnosis Date  . Hypertension   . Acid reflux   . Bronchitis     CHRONIC   . Anxiety     PT'S SON DIED 2010/06/20  . Pneumonia     X 3  - LAST TIME WAS 2011/06/20  . H/O hiatal hernia   . Arthritis     RA AND OA  . Pain     RIGHT KNEE - TORN MENISCUS AND ACL  . Stroke 03/2011    TIA--PT EXPERIENCED NUMBNESS RT HAND AND ARM , HEADACHE AND NAUSEA. NO RESIDUAL PROBLEMS  . Borderline hypercholesterolemia     on medication for TIA  . Environmental allergies   . PONV (postoperative nausea and vomiting) 09-2012    sore throat after ankle surgery 6 yrs ago and severe ponv 09-2012  . Allergic rhinitis   . GERD (gastroesophageal reflux disease)     on PPI and carafte prn  . IBS (irritable bowel syndrome)   . LBP (low back pain)   . Pulmonary nodule seen on imaging study   . OA (osteoarthritis)     Left Shoulder  . Colon polyp   . Shortness of breath     with activity,bronchitis    Past Surgical History  Procedure Laterality Date  .  Ankle surgery  ~10 years ago    left  . Shoulder surgery Left ~14 years ago    "scope"  . Thumb surgery Left ~14 years ago    x2  . Colonoscopy  2956,2130, 2011-06-20  . Trigger finger release  ~6 years ago    X2 ON RIGHT HAND  . Knee arthroscopy Right 10/13/2012    Procedure: RIGHT KNEE ARTHROSCOPY WITH DEBRIDEMENT, CHONDROPLASTY;  Surgeon: Gearlean Alf, MD;  Location: [facility];  Service: Orthopedics;  Laterality: Right;  . Back surgery  2009/06/19    lower  . Knee arthroscopy Right 03/02/2013    Procedure: RIGHT ARTHROSCOPY KNEE WITH MEDIAL MENISCAL  DEBRIDEMENT;  Surgeon: Gearlean Alf, MD;  Location: [facility];  Service: Orthopedics;  Laterality: Right;  . Cholecystectomy  ~15 years ago  . Root canal    . Tubal ligation    . Total knee arthroplasty Right 07/25/2013    Procedure: RIGHT TOTAL KNEE ARTHROPLASTY;  Surgeon: Gearlean Alf, MD;  Location: [facility];  Service: Orthopedics;  Laterality: Right;  . Maximum access (mas)posterior lumbar interbody fusion (plif) 1 level N/A 12/05/2013    Procedure: FOR MAXIMUM ACCESS (MAS) POSTERIOR LUMBAR INTERBODY FUSION (PLIF)LUMBAR FIVE-SACRAL-ONE,REMOVAL HARDWARE LUMBAR FOUR-FIVE;  Surgeon: Elaina Hoops, MD;  Location: [facility];  Service: Neurosurgery;  Laterality: N/A;  . Lumbar wound debridement N/A 01/11/2014    Procedure: Irrigation and Debridement Lumbar Wound for hematoma;  Surgeon: Elaina Hoops, MD;  Location: [facility];  Service: Neurosurgery;  Laterality: N/A;    Family History  Problem Relation Age of Onset  . Colon cancer Neg Hx   . Heart disease Sister   . Lung cancer Brother   . Heart disease Brother   . CAD Brother   . Heart attack Mother   . Emphysema Father   . Hypertension Daughter    Social History:  reports that she has never smoked. She has never used smokeless tobacco. She reports that she drinks alcohol. She reports that she does not use illicit drugs.  Allergies:  Allergies  Allergen Reactions  . Pseudoephedrine Other  (See Comments)    Makes jittery   . Reglan [Metoclopramide] Other (See Comments)    insomnia  . Vicodin [Hydrocodone-Acetaminophen] Other (See Comments)    "Makes me feel weird"  . Methotrexate Derivatives Other (See Comments)    Causes recurrent infections    Medications Prior to Admission  Medication Sig Dispense Refill  . ALPRAZolam (XANAX) 1 MG tablet Take 1 mg by mouth at bedtime.     Marland Kitchen atorvastatin (LIPITOR) 20 MG tablet Take 20 mg by mouth daily at 6 (six) AM.     . cetirizine (ZYRTEC) 10 MG tablet Take 10 mg by mouth daily.    . Esomeprazole Magnesium (NEXIUM PO) Take 1 tablet by mouth daily.     . fluticasone (FLONASE) 50 MCG/ACT nasal spray Place 2 sprays into both nostrils 2 (two) times daily.    Marland Kitchen lisinopril (PRINIVIL,ZESTRIL) 10 MG tablet Take 10 mg by mouth daily.  3  . naproxen sodium (ANAPROX) 220 MG tablet Take 220 mg by mouth 2 (two) times daily as needed (pain). ALEVE    . nortriptyline (PAMELOR) 25 MG capsule Take 25 mg by mouth at bedtime.   2  . oxyCODONE-acetaminophen (PERCOCET/ROXICET) 5-325 MG per tablet Take 1 tablet by mouth every 6 (six) hours as needed for moderate pain or severe pain.     . predniSONE (DELTASONE) 5 MG tablet Take 10 mg by mouth daily. Continuous for rheumatoid arthritis    . promethazine (PHENERGAN) 25 MG tablet Take 25 mg by mouth 2 (two) times daily as needed for nausea or vomiting.   2  . sucralfate (CARAFATE) 1 G tablet Take 1 g by mouth 2 (two) times daily as needed (acid reflux).     . triamterene-hydrochlorothiazide (MAXZIDE-25) 37.5-25 MG per tablet Take 1 tablet by mouth daily.    Marland Kitchen etanercept (ENBREL) 50 MG/ML injection Inject 50 mg into the skin once a week. On Sundays    . gabapentin (NEURONTIN) 300 MG capsule Take 1 capsule (300 mg total) by mouth 3 (three) times daily. (Patient not taking: Reported on 06/30/2014) 90 capsule 3  . HYDROmorphone (DILAUDID) 4 MG tablet Take 1 tablet (4 mg total) by mouth every 3 (three) hours as  needed for severe pain. (Patient not taking: Reported on 06/30/2014) 60 tablet 0  . leflunomide (ARAVA) 20 MG tablet Take 20 mg by mouth daily.      No results found for this or any previous visit (from the past 48 hour(s)). No results found.  Review of Systems  Constitutional: Negative.   HENT: Negative.   Eyes: Negative.   Respiratory: Negative.   Cardiovascular: Negative.   Gastrointestinal:  Negative.   Genitourinary: Negative.   Musculoskeletal: Positive for myalgias and back pain.  Skin: Negative.   Neurological: Positive for tingling and sensory change.  Psychiatric/Behavioral: Negative.     Blood pressure 102/59, pulse 94, temperature 97.5 F (36.4 C), resp. rate 20, weight 87.544 kg (193 lb), SpO2 100 %. Physical Exam  Constitutional: She is oriented to person, place, and time. She appears well-developed and well-nourished.  HENT:  Head: Normocephalic and atraumatic.  Neck: Normal range of motion.  Respiratory: Effort normal.  GI: Soft.  Neurological: She is alert and oriented to person, place, and time. She has normal strength. GCS eye subscore is 4. GCS verbal subscore is 5. GCS motor subscore is 6.  Strength is 5 out of 5 in her iliopsoas, quads, hamstrings, gastrocs, and tibialis, and EHL.     Assessment/Plan 64 year old female presents for revision of posterior lateral fusion at L5-S1  Dalen Hennessee P 07/05/2014, 8:03 AM

## 2014-07-05 NOTE — Anesthesia Preprocedure Evaluation (Addendum)
Anesthesia Evaluation  Patient identified by MRN, date of birth, ID band Patient awake    Reviewed: Allergy & Precautions, H&P , NPO status , Patient's Chart, lab work & pertinent test results  History of Anesthesia Complications (+) PONV and history of anesthetic complications  Airway Mallampati: III  TM Distance: <3 FB     Dental  (+) Teeth Intact, Dental Advidsory Given   Pulmonary shortness of breath and with exertion, pneumonia -, resolved,  breath sounds clear to auscultation        Cardiovascular hypertension, + DOE Rhythm:regular Rate:Normal     Neuro/Psych Anxiety CVA, No Residual Symptoms    GI/Hepatic hiatal hernia, GERD-  Medicated and Poorly Controlled,IBS   Endo/Other  obese  Renal/GU      Musculoskeletal  (+) Arthritis -, Osteoarthritis,    Abdominal   Peds  Hematology   Anesthesia Other Findings   Reproductive/Obstetrics                           Anesthesia Physical Anesthesia Plan  ASA: II  Anesthesia Plan: General   Post-op Pain Management:    Induction: Intravenous  Airway Management Planned: Oral ETT  Additional Equipment:   Intra-op Plan:   Post-operative Plan: Extubation in OR  Informed Consent: I have reviewed the patients History and Physical, chart, labs and discussed the procedure including the risks, benefits and alternatives for the proposed anesthesia with the patient or authorized representative who has indicated his/her understanding and acceptance.   Dental Advisory Given  Plan Discussed with: Anesthesiologist, CRNA and Surgeon  Anesthesia Plan Comments:        Anesthesia Quick Evaluation

## 2014-07-05 NOTE — Clinical Social Work Note (Signed)
CSW Consult Acknowledged:   CSW received a consult for SNF placement. CSW awaiting PT/OT evaluation to determine the appropriate level of care.      Kima Malenfant, MSW, LCSWA [phone]  

## 2014-07-05 NOTE — Progress Notes (Signed)
Patient arrived from PACU at 1245 to 4N15. Post op L-5 S1 PLIF. Honeycomb dsg clean , dry intact and hemovac drain in place. Pt and spouse oriented to room and vital signs obtained. Report received from PACU RN Suanne Marker.

## 2014-07-05 NOTE — Transfer of Care (Signed)
Immediate Anesthesia Transfer of Care Note  Patient: Angie Olson  Procedure(s) Performed: Procedure(s): Posterior Lateral Fusion  lumbar five sacral one with Illiac Crest Bone Graft (N/A) HARVEST ILIAC BONE GRAFT (N/A)  Patient Location: PACU  Anesthesia Type:General  Level of Consciousness: awake, alert  and oriented  Airway & Oxygen Therapy: Patient Spontanous Breathing and Patient connected to nasal cannula oxygen  Post-op Assessment: Report given to RN, Post -op Vital signs reviewed and stable and Patient moving all extremities X 4  Post vital signs: Reviewed and stable  Last Vitals:  Filed Vitals:   07/05/14 1155  BP:   Pulse: 107  Temp:   Resp: 25    Complications: No apparent anesthesia complications

## 2014-07-05 NOTE — Progress Notes (Signed)
UR complete.  Jazz Rogala RN, MSN 

## 2014-07-05 NOTE — Anesthesia Procedure Notes (Signed)
Procedure Name: Intubation Date/Time: 07/05/2014 8:27 AM Performed by: Neldon Newport Pre-anesthesia Checklist: Patient being monitored, Suction available, Emergency Drugs available, Patient identified and Timeout performed Patient Re-evaluated:Patient Re-evaluated prior to inductionOxygen Delivery Method: Circle system utilized Preoxygenation: Pre-oxygenation with 100% oxygen Intubation Type: IV induction Ventilation: Mask ventilation without difficulty Grade View: Grade I Tube size: 7.0 mm Number of attempts: 1 Airway Equipment and Method: Video-laryngoscopy Placement Confirmation: positive ETCO2,  ETT inserted through vocal cords under direct vision and breath sounds checked- equal and bilateral Secured at: 22 cm Tube secured with: Tape Dental Injury: Teeth and Oropharynx as per pre-operative assessment

## 2014-07-06 LAB — BASIC METABOLIC PANEL
Anion gap: 9 (ref 5–15)
BUN: 21 mg/dL (ref 6–23)
CO2: 30 mmol/L (ref 19–32)
Calcium: 9.1 mg/dL (ref 8.4–10.5)
Chloride: 98 mmol/L (ref 96–112)
Creatinine, Ser: 1.22 mg/dL — ABNORMAL HIGH (ref 0.50–1.10)
GFR calc Af Amer: 53 mL/min — ABNORMAL LOW (ref >90)
GFR calc non Af Amer: 46 mL/min — ABNORMAL LOW (ref >90)
Glucose, Bld: 189 mg/dL — ABNORMAL HIGH (ref 70–99)
Potassium: 5.1 mmol/L (ref 3.5–5.1)
Sodium: 137 mmol/L (ref 135–145)

## 2014-07-06 NOTE — Progress Notes (Signed)
OT Cancellation Note  Patient Details Name: KARI MONTERO MRN: [id number] DOB: 13-Jul-1950   Cancelled Treatment:    Reason Eval/Treat Not Completed: Fatigue/lethargy limiting ability to participate. Pt c/o fatigue and requesting OT to come back later. OT to reattempt as schedule permits.  Hortencia Pilar 07/06/2014, 9:24 AM

## 2014-07-06 NOTE — Evaluation (Signed)
Physical Therapy Evaluation Patient Details Name: Angie Olson MRN: [id number] DOB: Dec 07, 1950 Today's Date: 07/06/2014   History of Present Illness  Patient is a 64 yo female with history of HTN, stroke, hyperlipidemia, GERD, Multiple lumbar surgeries now presents s/p posterior Lateral Fusion lumbar five sacral one with Illiac Crest Bone Graft   Clinical Impression  Patient demonstrates deficits in functional mobility as indicated below. Will need continued skilled PT to address deficits and maximize function. Will see as indicated and progress as tolerated.  OF NOTE: patient mobilizing well, educated and reviewed precautions with patient and spouse at length. Discussed precautions with teach back 3/3 recalled with cues. Address positioning concerns with patient and answered questions posed by spouse.     Follow Up Recommendations No PT follow up;Supervision - Intermittent    Equipment Recommendations  None recommended by PT    Recommendations for Other Services       Precautions / Restrictions Precautions Precautions: Back Precaution Booklet Issued: Yes (comment) Precaution Comments: reviewed precautions Required Braces or Orthoses: Spinal Brace Spinal Brace: Lumbar corset;Applied in sitting position Restrictions Weight Bearing Restrictions: No      Mobility  Bed Mobility Overal bed mobility: Needs Assistance Bed Mobility: Rolling;Sidelying to Sit;Sit to Sidelying Rolling: Supervision Sidelying to sit: Supervision     Sit to sidelying: Supervision General bed mobility comments: VCs for technique and sequencing, no physical assist required  Transfers Overall transfer level: Needs assistance Equipment used: Rolling walker (2 wheeled) Transfers: Sit to/from Stand Sit to Stand: Supervision         General transfer comment: Performed from bed and commode without physical assist  Ambulation/Gait Ambulation/Gait assistance: Supervision Ambulation Distance  (Feet): 200 Feet Assistive device: Rolling walker (2 wheeled) Gait Pattern/deviations: Step-through pattern;Decreased stride length Gait velocity: decreased Gait velocity interpretation: Below normal speed for age/gender General Gait Details: VCs for upright posture and increased cadence  Stairs            Wheelchair Mobility    Modified Rankin (Stroke Patients Only)       Balance   Sitting-balance support: No upper extremity supported Sitting balance-Leahy Scale: Good Sitting balance - Comments: pt able to don brace EOB   Standing balance support: Bilateral upper extremity supported Standing balance-Leahy Scale: Fair Standing balance comment: Able to perform hygiene and functional tasks without assist                             Pertinent Vitals/Pain Pain Assessment: 0-10 Pain Score: 5  Pain Location: back pain Pain Descriptors / Indicators: Aching Pain Intervention(s): Monitored during session;Limited activity within patient's tolerance;Repositioned    Home Living Family/patient expects to be discharged to:: Private residence Living Arrangements: Spouse/significant other Available Help at Discharge: Family;Available 24 hours/day Type of Home: House Home Access: Stairs to enter Entrance Stairs-Rails: Left Entrance Stairs-Number of Steps: 3 Home Layout: Multi-level;Able to live on main level with bedroom/bathroom Home Equipment: [place] - 2 wheels;Bedside commode;Cane - single point;Adaptive equipment;Shower seat      Prior Function Level of Independence: Independent         Comments: recently began using AD due to increased pain in back     Hand Dominance   Dominant Hand: Right    Extremity/Trunk Assessment   Upper Extremity Assessment: Overall WFL for tasks assessed           Lower Extremity Assessment: Generalized weakness         Communication  Communication: No difficulties  Cognition Arousal/Alertness:  Awake/alert Behavior During Therapy: WFL for tasks assessed/performed Overall Cognitive Status: Within Functional Limits for tasks assessed                      General Comments      Exercises        Assessment/Plan    PT Assessment Patient needs continued PT services  PT Diagnosis Difficulty walking;Acute pain   PT Problem List Decreased strength;Decreased range of motion;Decreased activity tolerance;Decreased balance;Decreased mobility;Obesity;Pain  PT Treatment Interventions DME instruction;Gait training;Stair training;Functional mobility training;Therapeutic activities;Therapeutic exercise;Balance training;Patient/family education   PT Goals (Current goals can be found in the Care Plan section) Acute Rehab PT Goals Patient Stated Goal: none stated PT Goal Formulation: With patient/family Time For Goal Achievement: 07/20/14 Potential to Achieve Goals: Good    Frequency Min 5X/week   Barriers to discharge        Co-evaluation               End of Session Equipment Utilized During Treatment: Gait belt;Back brace Activity Tolerance: Patient tolerated treatment well;No increased pain Patient left: in bed;with call bell/phone within reach;with family/visitor present Nurse Communication: Mobility status         Time: 0630-1601 PT Time Calculation (min) (ACUTE ONLY): 34 min   Charges:   PT Evaluation $Initial PT Evaluation Tier I: 1 Procedure PT Treatments $Gait Training: 8-22 mins   PT G CodesDuncan Dull 2014/07/12, 4:34 PM Alben Deeds, [place] DPT  [phone]

## 2014-07-06 NOTE — Progress Notes (Signed)
Subjective: Patient reports [name] doing well condition back pain but no leg pain  Objective: Vital signs in last 24 hours: Temp:  [97.1 F (36.2 C)-98.4 F (36.9 C)] 98.2 F (36.8 C) (04/21 0553) Pulse Rate:  [87-107] 94 (04/21 0553) Resp:  [12-25] 18 (04/21 0553) BP: (94-121)/(47-68) 121/68 mmHg (04/21 0553) SpO2:  [95 %-100 %] 98 % (04/21 0553) Weight:  [87.5 kg (192 lb 14.4 oz)] 87.5 kg (192 lb 14.4 oz) (04/20 1937)  Intake/Output from previous day: 04/20 0701 - 04/21 0700 In: 1850 [I.V.:1850] Out: 4765 [Urine:1175; Drains:165; Blood:200] Intake/Output this shift:    Strength out of 5 wound clean dry and intact  Lab Results: No results for input(s): WBC, HGB, HCT, PLT in the last 72 hours. BMET  Recent Labs  07/05/14 0651  NA 139  K 4.7  CL 101  CO2 27  GLUCOSE 116*  BUN 35*  CREATININE 1.63*  CALCIUM 9.8    Studies/Results: Dg Lumbar Spine 1 View  07/05/2014   CLINICAL DATA:  L5-S1 posterior fusion  EXAM: DG C-ARM 61-120 MIN; LUMBAR SPINE - 1 VIEW  COMPARISON:  04/11/2014  FLUOROSCOPY TIME:  Radiation Exposure Index (as provided by the fluoroscopic device):  If the device does not provide the exposure index:  Fluoroscopy Time:  44 seconds  Number of Acquired Images:  0  FINDINGS: Two intraoperative spot images demonstrate pedicle screws in place at L5 and S1. Discectomy changes at L4-5 and L5-S1. No hardware or bony complicating feature. Normal alignment.  IMPRESSION: Posterior fusion L5-S1.   Electronically Signed   By: Rolm Baptise M.D.   On: 07/05/2014 12:11   Dg C-arm 61-120 Min  07/05/2014   CLINICAL DATA:  L5-S1 posterior fusion  EXAM: DG C-ARM 61-120 MIN; LUMBAR SPINE - 1 VIEW  COMPARISON:  04/11/2014  FLUOROSCOPY TIME:  Radiation Exposure Index (as provided by the fluoroscopic device):  If the device does not provide the exposure index:  Fluoroscopy Time:  44 seconds  Number of Acquired Images:  0  FINDINGS: Two intraoperative spot images demonstrate pedicle  screws in place at L5 and S1. Discectomy changes at L4-5 and L5-S1. No hardware or bony complicating feature. Normal alignment.  IMPRESSION: Posterior fusion L5-S1.   Electronically Signed   By: Rolm Baptise M.D.   On: 07/05/2014 12:11    Assessment/Plan: Mobilized today with physical occupational therapy we'll check BMP  LOS: 1 day     Angie Olson P 07/06/2014, 7:33 AM

## 2014-07-06 NOTE — Evaluation (Signed)
Occupational Therapy Evaluation Patient Details Name: Angie Olson MRN: [id number] DOB: 23-May-1950 Today's Date: 07/06/2014    History of Present Illness s/p Posterior Lateral Fusion lumbar five sacral one with Illiac Crest Bone Graft. Previously underwent an L5-S1 posterior lumbar interbody fusion however the bone graft did not completely fuse and she developed loosening of her S1 screws bilaterally worse on the right..    Clinical Impression   Pt admitted with the above diagnoses and presents with below problem list. Pt will benefit from continued acute OT to address the below listed deficits and maximize independence with basic ADLs prior to d/c home with spouse. PTA pt was independent with ADLs. Pt currently min guard for LB ADLs and functional mobility. OT to continue to follow acutely.      Follow Up Recommendations  Supervision/Assistance - 24 hour;No OT follow up    Equipment Recommendations  None recommended by OT    Recommendations for Other Services       Precautions / Restrictions Precautions Precautions: Back Precaution Booklet Issued: No Precaution Comments: reviewed precautions Required Braces or Orthoses: Spinal Brace Spinal Brace: Lumbar corset;Applied in sitting position Restrictions Weight Bearing Restrictions: No      Mobility Bed Mobility Overal bed mobility: Needs Assistance Bed Mobility: Rolling;Sidelying to Sit Rolling: Supervision         General bed mobility comments: cues for technique with pt raising trunk in isolation from lowering BLE.  Transfers Overall transfer level: Needs assistance Equipment used: Rolling walker (2 wheeled) Transfers: Sit to/from Stand Sit to Stand: Min guard         General transfer comment: for safety. from EOB and 3n1    Balance Overall balance assessment: Needs assistance Sitting-balance support: No upper extremity supported;Feet supported Sitting balance-Leahy Scale: Good Sitting balance -  Comments: pt able to don brace EOB   Standing balance support: Bilateral upper extremity supported;During functional activity Standing balance-Leahy Scale: Fair Standing balance comment: able to let go of rw briefly to complete toilet cm/h and to wash hands standing at sink                            ADL Overall ADL's : Needs assistance/impaired Eating/Feeding: Set up;Sitting   Grooming: Set up;Sitting   Upper Body Bathing: Set up;Sitting   Lower Body Bathing: Min guard;Sit to/from stand;With adaptive equipment   Upper Body Dressing : Set up;Sitting   Lower Body Dressing: Min guard;With adaptive equipment;Sit to/from stand   Toilet Transfer: Min guard;Ambulation;RW (3n1 over toielt)   Toileting- Clothing Manipulation and Hygiene: Min guard;Sit to/from stand;With adaptive equipment   Tub/ Shower Transfer: Min guard;Ambulation;Shower seat;Rolling walker   Functional mobility during ADLs: Min guard;Rolling walker General ADL Comments: Pt completed logrolling technique. Donned spinal brace EOB. Completed in-room ambulation at min guard level. Completed toilet transfer nand cm/h at min guard level. Stood to wash hands at sink at min guard level. In recliner at end of session. Educated on techniques and AE for completion of ADLs with back precautions. Pt with good recall from previous back surgery.     Vision     Perception     Praxis      Pertinent Vitals/Pain Pain Assessment: 0-10 Pain Score: 6  Pain Location: back L>R Pain Descriptors / Indicators: Aching Pain Intervention(s): Limited activity within patient's tolerance;Monitored during session;Repositioned     Hand Dominance Right   Extremity/Trunk Assessment Upper Extremity Assessment Upper Extremity Assessment: Overall WFL for  tasks assessed   Lower Extremity Assessment Lower Extremity Assessment: Defer to PT evaluation       Communication Communication Communication: No difficulties   Cognition  Arousal/Alertness: Awake/alert Behavior During Therapy: WFL for tasks assessed/performed Overall Cognitive Status: Within Functional Limits for tasks assessed                     General Comments       Exercises       Shoulder Instructions      Home Living Family/patient expects to be discharged to:: Private residence Living Arrangements: Spouse/significant other Available Help at Discharge: Family;Available 24 hours/day Type of Home: House Home Access: Stairs to enter [employer] of Steps: 3 Entrance Stairs-Rails: Left Home Layout: Multi-level;Able to live on main level with bedroom/bathroom     Bathroom Shower/Tub: Tub/shower unit;Walk-in shower   Bathroom Toilet: Handicapped height Bathroom Accessibility: Yes How Accessible: Accessible via walker Home Equipment: [place] - 2 wheels;Bedside commode;Cane - single point;Adaptive equipment;Shower Theme park manager: [employer]acher        Prior Functioning/Environment Level of Independence: Independent        Comments: recently began using AD due to increased pain in back    OT Diagnosis: Acute pain   OT Problem List: Impaired balance (sitting and/or standing);Decreased knowledge of use of DME or AE;Decreased knowledge of precautions;Pain   OT Treatment/Interventions: Self-care/ADL training;Energy conservation;DME and/or AE instruction;Therapeutic activities;Patient/family education;Balance training    OT Goals(Current goals can be found in the care plan section) Acute Rehab OT Goals Patient Stated Goal: not stated OT Goal Formulation: With patient Time For Goal Achievement: 07/13/14 Potential to Achieve Goals: Good ADL Goals Pt Will Perform Grooming: with modified independence;standing Pt Will Perform Lower Body Bathing: with modified independence;with adaptive equipment;sit to/from stand Pt Will Perform Lower Body Dressing: with modified independence;with adaptive equipment;sit to/from stand Pt  Will Perform Tub/Shower Transfer: with modified independence;ambulating;shower seat;rolling walker  OT Frequency: Min 2X/week   Barriers to D/C:            Co-evaluation              End of Session Equipment Utilized During Treatment: Gait belt;Rolling walker;Back brace  Activity Tolerance: Patient tolerated treatment well Patient left: in chair;with call bell/phone within reach;with chair alarm set   Time: 7619-5093 OT Time Calculation (min): 22 min Charges:  OT General Charges $OT Visit: 1 Procedure OT Evaluation $Initial OT Evaluation Tier I: 1 Procedure G-Codes:    Hortencia Pilar 07/23/2014, 12:32 PM

## 2014-07-06 NOTE — Anesthesia Postprocedure Evaluation (Signed)
Anesthesia Post Note  Patient: Angie Olson  Procedure(s) Performed: Procedure(s) (LRB): Posterior Lateral Fusion  lumbar five sacral one with Illiac Crest Bone Graft (N/A) HARVEST ILIAC BONE GRAFT (N/A)  Anesthesia type: General  Patient location: PACU  Post pain: Pain level controlled and Adequate analgesia  Post assessment: Post-op Vital signs reviewed, Patient's Cardiovascular Status Stable, Respiratory Function Stable, Patent Airway and Pain level controlled  Last Vitals:  Filed Vitals:   07/06/14 1030  BP: 116/62  Pulse: 97  Temp: 36.7 C  Resp: 20    Post vital signs: Reviewed and stable  Level of consciousness: awake, alert  and oriented  Complications: No apparent anesthesia complications

## 2014-07-07 LAB — WOUND CULTURE: CULTURE: NO GROWTH

## 2014-07-07 NOTE — Progress Notes (Signed)
Patient ID: Angie Olson, female   DOB: 01-15-1951, 64 y.o.   MRN: [id number] BP 106/49 mmHg  Pulse 98  Temp(Src) 98.7 F (37.1 C) (Oral)  Resp 16  Ht 5\' 5"  (1.651 m)  Wt 87.5 kg (192 lb 14.4 oz)  BMI 32.10 kg/m2  SpO2 99% Alert and oriented x 4 Moving all extremities well Wound is clean, dry, without signs of infection Drain in place Pain as expected in lumbar region, continue to mobilize.

## 2014-07-07 NOTE — Progress Notes (Signed)
Physical Therapy Treatment Patient Details Name: Angie Olson MRN: [id number] DOB: 1950-10-06 Today's Date: 07/07/2014    History of Present Illness Patient is a 64 yo female with history of HTN, stroke, hyperlipidemia, GERD, Multiple lumbar surgeries now presents s/p posterior Lateral Fusion lumbar five sacral one with Illiac Crest Bone Graft     PT Comments    Patient progressing well with mobility. Ambulated increased distance around unit this session, performed stair negotiation and educated on car transfer technique. Patient with good recall of prior education and precautions, but continues to require some cues for compliance. Will continue to progress as tolerated.   Follow Up Recommendations  No PT follow up;Supervision - Intermittent     Equipment Recommendations  None recommended by PT    Recommendations for Other Services       Precautions / Restrictions Precautions Precautions: Back Precaution Booklet Issued: Yes (comment) Precaution Comments: reviewed precautions Required Braces or Orthoses: Spinal Brace Spinal Brace: Lumbar corset;Applied in sitting position Restrictions Weight Bearing Restrictions: No    Mobility  Bed Mobility Overal bed mobility: Needs Assistance Bed Mobility: Rolling;Sidelying to Sit;Sit to Sidelying Rolling: Supervision Sidelying to sit: Supervision     General bed mobility comments: VCs for technique and sequencing, no physical assist required  Transfers Overall transfer level: Needs assistance Equipment used: Rolling walker (2 wheeled) Transfers: Sit to/from Stand Sit to Stand: Supervision         General transfer comment: No physical assist required, ocasional cues for compliance with precautions   Ambulation/Gait Ambulation/Gait assistance: Supervision Ambulation Distance (Feet): 460 Feet Assistive device: Rolling walker (2 wheeled) Gait Pattern/deviations: Step-through pattern;Decreased stride length Gait velocity:  decreased Gait velocity interpretation: Below normal speed for age/gender General Gait Details: continued VCs for upright posture and increased cadence   Stairs Stairs: Yes Stairs assistance: Supervision Stair Management: One rail Left;Step to pattern Number of Stairs: 5 General stair comments: educated patient on sequencing and positioning.  Wheelchair Mobility    Modified Rankin (Stroke Patients Only)       Balance   Sitting-balance support: No upper extremity supported Sitting balance-Leahy Scale: Good Sitting balance - Comments: pt able to don brace EOB     Standing balance-Leahy Scale: Fair                      Cognition Arousal/Alertness: Awake/alert Behavior During Therapy: WFL for tasks assessed/performed Overall Cognitive Status: Within Functional Limits for tasks assessed                      Exercises      General Comments General comments (skin integrity, edema, etc.): patient able to recall 3/3 precautions without cues as well as able to state back education from previous session.  (educated on car transfer technique)      Pertinent Vitals/Pain Pain Assessment: 0-10 Pain Score: 6  Pain Location: back and right knee Pain Descriptors / Indicators: Sore Pain Intervention(s): Monitored during session;Repositioned    Home Living                      Prior Function            PT Goals (current goals can now be found in the care plan section) Acute Rehab PT Goals Patient Stated Goal: none stated PT Goal Formulation: With patient/family Time For Goal Achievement: 07/20/14 Potential to Achieve Goals: Good Progress towards PT goals: Progressing toward goals    Frequency  Min 5X/week    PT Plan Current plan remains appropriate    Co-evaluation             End of Session Equipment Utilized During Treatment: Gait belt;Back brace Activity Tolerance: Patient tolerated treatment well;No increased pain Patient left:  in chair;with call bell/phone within reach;with chair alarm set     Time: [phone] PT Time Calculation (min) (ACUTE ONLY): 24 min  Charges:  $Gait Training: 8-22 mins $Self Care/Home Management: 8-22                    G CodesDuncan Dull 2014-07-29, 9:48 AM Alben Deeds, PT DPT  [phone]

## 2014-07-08 NOTE — Progress Notes (Signed)
Physical Therapy Treatment Patient Details Name: Angie Olson MRN: [id number] DOB: 03-31-1950 Today's Date: 07/08/2014    History of Present Illness Patient is a 64 yo female with history of HTN, stroke, hyperlipidemia, GERD, Multiple lumbar surgeries now presents s/p posterior Lateral Fusion lumbar five sacral one with Illiac Crest Bone Graft     PT Comments    Patient slowly progressing with therapy, very guarded in her posture and positioning and slow gait cadence. Patient doing well to manage her back precautions during mobility.  Will continue to progress mobility while on this venue of care.  Follow Up Recommendations  No PT follow up;Supervision - Intermittent     Equipment Recommendations  None recommended by PT    Recommendations for Other Services       Precautions / Restrictions Precautions Precautions: Back Precaution Booklet Issued: Yes (comment) Precaution Comments: reviewed precautions Required Braces or Orthoses: Spinal Brace Spinal Brace: Lumbar corset;Applied in sitting position Restrictions Weight Bearing Restrictions: No    Mobility  Bed Mobility Overal bed mobility: Needs Assistance Bed Mobility: Sidelying to Sit   Sidelying to sit: Supervision       General bed mobility comments: upon entering room, patient in Rt sidelying with pillows between knees  Transfers Overall transfer level: Needs assistance Equipment used: Rolling walker (2 wheeled) Transfers: Sit to/from Stand Sit to Stand: Supervision         General transfer comment: min cues for safety, needs incr time for transfer. supervision for toileting for safety  Ambulation/Gait Ambulation/Gait assistance: Supervision Ambulation Distance (Feet): 200 Feet Assistive device: Rolling walker (2 wheeled) Gait Pattern/deviations: Shuffle;Trunk flexed (heavy reliance on RW) Gait velocity: 10' in 12.60 sec = .79 ft/sec Gait velocity interpretation: <1.8 ft/sec, indicative of risk for  recurrent falls General Gait Details: continued VCs for upright posture and increased cadence   Stairs Stairs: Yes Stairs assistance: Supervision Stair Management: One rail Left;Step to pattern;Sideways Number of Stairs: 5    Wheelchair Mobility    Modified Rankin (Stroke Patients Only)       Balance Overall balance assessment: Needs assistance Sitting-balance support: No upper extremity supported Sitting balance-Leahy Scale: Good Sitting balance - Comments: min assist to don brace EOB Postural control: Other (comment) (posterior pelvic tilt) Standing balance support: Bilateral upper extremity supported Standing balance-Leahy Scale: Fair Standing balance comment: able to perform hand washing task at sink without support                    Cognition Arousal/Alertness: Awake/alert Behavior During Therapy: WFL for tasks assessed/performed Overall Cognitive Status: Within Functional Limits for tasks assessed                      Exercises      General Comments General comments (skin integrity, edema, etc.): continued use of drain at surgical site.  Patient reports being familiar with back precautions from previous surgery      Pertinent Vitals/Pain Pain Assessment: 0-10 Pain Score: 3  Pain Location: Rt buttock, back Pain Descriptors / Indicators: Tender;Sore Pain Intervention(s): Limited activity within patient's tolerance;Monitored during session    Home Living                      Prior Function            PT Goals (current goals can now be found in the care plan section) Acute Rehab PT Goals Patient Stated Goal: stay comfortable PT Goal Formulation: With patient  Time For Goal Achievement: 07/20/14 Potential to Achieve Goals: Good Progress towards PT goals: Progressing toward goals    Frequency  Min 5X/week    PT Plan Current plan remains appropriate    Co-evaluation             End of Session Equipment Utilized During  Treatment: Gait belt;Back brace Activity Tolerance: Patient tolerated treatment well;No increased pain Patient left: in bed;with call bell/phone within reach (sitting EOB per patient request)     Time: 1610-9604 PT Time Calculation (min) (ACUTE ONLY): 20 min  Charges:  $Gait Training: 8-22 mins                    G CodesMalka So, [place] [phone] [place] 07/08/2014, 10:24 AM

## 2014-07-08 NOTE — Progress Notes (Signed)
Occupational Therapy Treatment Patient Details Name: CALIROSE MCCANCE MRN: [id number] DOB: 08-22-1950 Today's Date: 07/08/2014    History of present illness Patient is a 64 yo female with history of HTN, stroke, hyperlipidemia, GERD, Multiple lumbar surgeries now presents s/p posterior Lateral Fusion lumbar five sacral one with Illiac Crest Bone Graft    OT comments  Pt progressing and moving well. Education provided in session.  Follow Up Recommendations  No OT follow up;Supervision - Intermittent    Equipment Recommendations  None recommended by OT    Recommendations for Other Services      Precautions / Restrictions Precautions Precautions: Back Precaution Booklet Issued: No Precaution Comments: reviewed precautions; pt able to state 3/3 precautions Required Braces or Orthoses: Spinal Brace Spinal Brace: Lumbar corset;Applied in sitting position;Other (comment) (adjusted in standing) Restrictions Weight Bearing Restrictions: No       Mobility Bed Mobility Overal bed mobility: Needs Assistance Bed Mobility: Sidelying to Sit;Sit to Sidelying   Sidelying to sit: Supervision     Sit to sidelying: Modified independent (Device/Increase time)    Transfers Overall transfer level: Needs assistance   Transfers: Sit to/from Stand Sit to Stand: Supervision              Balance  No LOB in session-balance not formally assessed.                                 ADL Overall ADL's : Needs assistance/impaired     Grooming: Wash/dry hands;Applying deodorant;Brushing hair;Set up;Supervision/safety;Standing           Upper Body Dressing : Set up;Supervision/safety;Sitting;Standing (back brace)       Toilet Transfer: Supervision/safety;RW;Ambulation   Toileting- Clothing Manipulation and Hygiene: Supervision/safety;Sit to/from stand (Water quality scientist)   Tub/ Shower Transfer: Min guard;Walk-in shower;Ambulation;Rolling walker   Functional  mobility during ADLs: Supervision/safety;Rolling walker (Supervision/Min guard for simulated shower transfer) General ADL Comments: Pt able to cross legs over knees. Discussed incorporating precautions into functional activities. Reviewed back brace. Educated on shower transfer technique and recommended pt use walker for shower transfer with sidestepping. Educated on safety such as rugs/items on floor and recommended spouse be with her for shower transfer.  Discussed AE including toilet aide and where they could purchase tongs to use.  Educated on positioning of pillows.       Vision                     Perception     Praxis      Cognition  Awake/Alert Behavior During Therapy: WFL for tasks assessed/performed Overall Cognitive Status: Within Functional Limits for tasks assessed                       Extremity/Trunk Assessment               Exercises     Shoulder Instructions       General Comments      Pertinent Vitals/ Pain       Pain Assessment: 0-10 Pain Score:  (6-7) Pain Location: Rt knee and back Pain Descriptors / Indicators: Aching;Dull;Sharp (dull ache in right knee and sharp/dull pain in back) Pain Intervention(s): Monitored during session;Repositioned  Home Living  Prior Functioning/Environment              Frequency Min 2X/week     Progress Toward Goals  OT Goals(current goals can now be found in the care plan section)  Progress towards OT goals: Progressing toward goals  Acute Rehab OT Goals Patient Stated Goal: not stated OT Goal Formulation: With patient Time For Goal Achievement: 07/13/14 Potential to Achieve Goals: Good ADL Goals Pt Will Perform Grooming: with modified independence;standing Pt Will Perform Lower Body Bathing: with modified independence;with adaptive equipment;sit to/from stand Pt Will Perform Lower Body Dressing: with modified independence;with  adaptive equipment;sit to/from stand Pt Will Perform Tub/Shower Transfer: with modified independence;ambulating;shower seat;rolling walker  Plan Discharge plan needs to be updated    Co-evaluation                 End of Session Equipment Utilized During Treatment: Gait belt;Rolling walker;Back brace   Activity Tolerance Patient tolerated treatment well   Patient Left in bed;with call bell/phone within reach;with family/visitor present   Nurse Communication          Time: 9983-3825 OT Time Calculation (min): 17 min  Charges: OT General Charges $OT Visit: 1 Procedure OT Treatments $Self Care/Home Management : 8-22 mins  Benito Mccreedy OTR/L [phone] 07/08/2014, 3:13 PM

## 2014-07-08 NOTE — Progress Notes (Signed)
Pt ambulates with rolling walker, brace on and aligned to the bathroom standby assist. Gait steady. Denied pain or discomfort. Will continue to monitor.

## 2014-07-08 NOTE — Progress Notes (Signed)
Hemovac removed 50ml emptied. Dry dressing applied. Surgical dressing site changed, clean, dry and intact. No drainage. Pt denied pain or discomfort. Will continue to monitor.  IV to right AC discontinued per Md order.

## 2014-07-08 NOTE — Progress Notes (Signed)
Patient ID: Angie Olson, female   DOB: 1950/09/03, 64 y.o.   MRN: [id number] Vital signs are stable Patient has drain in place and lumbar spine She is also been receiving intermittent doses of IV Dilaudid She desires to go home but notes that she lives in [place] away beyond [facility]. Her and her husband would prefer to go home tomorrow. We'll make changes including discontinuing the IV in the IV pain medication changes the dressing remove the drain in preparation for discharge tomorrow

## 2014-07-09 ENCOUNTER — Encounter (HOSPITAL_COMMUNITY): Payer: Self-pay | Admitting: General Practice

## 2014-07-09 MED ORDER — CYCLOBENZAPRINE HCL 10 MG PO TABS: 10.0000 mg | ORAL_TABLET | Freq: Three times a day (TID) | ORAL | Status: DC | PRN

## 2014-07-09 MED ORDER — OXYCODONE-ACETAMINOPHEN 5-325 MG PO TABS: 1.0000 | ORAL_TABLET | ORAL | Status: DC | PRN

## 2014-07-09 NOTE — Progress Notes (Signed)
Subjective: Patient reports doing well  Objective: Vital signs in last 24 hours: Temp:  [97.7 F (36.5 C)-98.6 F (37 C)] 97.7 F (36.5 C) (04/24 0626) Pulse Rate:  [84-106] 84 (04/24 0626) Resp:  [18] 18 (04/24 0626) BP: (90-114)/(45-56) 90/52 mmHg (04/24 0626) SpO2:  [95 %-100 %] 99 % (04/24 0626)  Intake/Output from previous day:   Intake/Output this shift:    Physical Exam: Strength full.  Dressing CDI.    Lab Results: No results for input(s): WBC, HGB, HCT, PLT in the last 72 hours. BMET  Recent Labs  07/06/14 1010  NA 137  K 5.1  CL 98  CO2 30  GLUCOSE 189*  BUN 21  CREATININE 1.22*  CALCIUM 9.1    Studies/Results: No results found.  Assessment/Plan: Doing well. Discharge home.    LOS: 4 days    Peggyann Shoals, MD 07/09/2014, 8:09 AM

## 2014-07-09 NOTE — Progress Notes (Signed)
Pt discharging at this time with her husband alert, verbal taking all personal belongings. Brace on and aligned, pt educated and  aware of precautions. IV discontinued, dry dressing applied. Discharge instructions provided with verbal understanding. Pt scheduled follow up appt. Pt denies pain or discomfort.

## 2014-07-09 NOTE — Discharge Summary (Signed)
Physician Discharge Summary  Patient ID: SERENAH MILL MRN: [id number] DOB/AGE: [birth year] 64 y.o.  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 rate 18, height 5\' 5"  (1.651 m), weight 87.5 kg (192 lb 14.4 oz), SpO2 100 %. Neurologic: Alert and oriented X 3, normal strength and tone. Normal symmetric reflexes. Normal coordination and gait Wound:CDI  Disposition: Home     Medication List    STOP taking these medications        etanercept 50 MG/ML injection  Commonly known as:  ENBREL     gabapentin 300 MG capsule  Commonly known as:  NEURONTIN     HYDROmorphone 4 MG tablet  Commonly known as:  DILAUDID     leflunomide 20 MG tablet  Commonly known as:  ARAVA      TAKE these medications        ALPRAZolam 1 MG tablet  Commonly known as:  XANAX  Take 1 mg by mouth at bedtime.     atorvastatin 20 MG tablet  Commonly known as:  LIPITOR  Take 20 mg by mouth daily at 6 (six) AM.     cetirizine 10 MG tablet  Commonly known as:  ZYRTEC  Take 10 mg by mouth daily.     cyclobenzaprine 10 MG tablet  Commonly known as:  FLEXERIL  Take 1 tablet (10 mg total) by mouth 3 (three) times daily as needed for muscle spasms.     fluticasone 50 MCG/ACT nasal spray  Commonly known as:  FLONASE  Place 2 sprays into both nostrils 2 (two) times daily.     lisinopril 10 MG tablet  Commonly  known as:  PRINIVIL,ZESTRIL  Take 10 mg by mouth daily.     naproxen sodium 220 MG tablet  Commonly known as:  ANAPROX  Take 220 mg by mouth 2 (two) times daily as needed (pain). ALEVE     NEXIUM PO  Take 1 tablet by mouth daily.     nortriptyline 25 MG capsule  Commonly known as:  PAMELOR  Take 25 mg by mouth at bedtime.     oxyCODONE-acetaminophen 5-325 MG per tablet  Commonly known as:  PERCOCET/ROXICET  Take 1 tablet by mouth every 6 (six) hours as needed for moderate pain or severe pain.     oxyCODONE-acetaminophen 5-325 MG per tablet  Commonly known as:  PERCOCET/ROXICET  Take 1-2 tablets by mouth every 4 (four) hours as needed for moderate pain or severe pain.     predniSONE 5 MG tablet  Commonly known as:  DELTASONE  Take 10 mg by mouth daily. Continuous for rheumatoid arthritis     promethazine 25 MG tablet  Commonly known as:  PHENERGAN  Take 25 mg by mouth 2 (two) times daily as needed for nausea or vomiting.     sucralfate 1 G tablet  Commonly known as:  CARAFATE  Take 1 g by mouth 2 (two) times daily as needed (acid reflux).     triamterene-hydrochlorothiazide 37.5-25 MG per tablet  Commonly known as:  MAXZIDE-25  Take 1 tablet by mouth daily.         Signed: Peggyann Shoals, MD 07/09/2014, 12:07 PM

## 2014-07-09 NOTE — Progress Notes (Signed)
PT Cancellation Note  Patient Details Name: Angie Olson MRN: [id number] DOB: 1951/02/04   Cancelled Treatment:    Reason Eval/Treat Not Completed: Patient declined, no reason specified.  Patient reports she is probably going home today.  States she is doing well with mobility.  Did stairs last session.  Reports she would rather save her strength for trip home.  Politely declined PT.   Despina Pole 07/09/2014, 12:40 PM Carita Pian. Sanjuana Kava, [place] Pager [phone]

## 2014-07-10 ENCOUNTER — Encounter (HOSPITAL_COMMUNITY): Payer: Self-pay | Admitting: Neurosurgery

## 2014-07-10 LAB — ANAEROBIC CULTURE

## 2014-07-18 ENCOUNTER — Encounter (HOSPITAL_COMMUNITY): Payer: Self-pay | Admitting: Neurology

## 2014-07-18 ENCOUNTER — Emergency Department (HOSPITAL_COMMUNITY): Payer: 59

## 2014-07-18 ENCOUNTER — Inpatient Hospital Stay (HOSPITAL_COMMUNITY)
Admission: EM | Admit: 2014-07-18 | Discharge: 2014-07-21 | DRG: 392 | Disposition: A | Discharge status: Home / Self Care | Payer: 59 | Attending: Internal Medicine | Admitting: Internal Medicine

## 2014-07-18 DIAGNOSIS — G894 Chronic pain syndrome: Secondary | ICD-10-CM | POA: Diagnosis present

## 2014-07-18 DIAGNOSIS — Z9049 Acquired absence of other specified parts of digestive tract: Secondary | ICD-10-CM | POA: Diagnosis present

## 2014-07-18 DIAGNOSIS — Z96651 Presence of right artificial knee joint: Secondary | ICD-10-CM | POA: Diagnosis present

## 2014-07-18 DIAGNOSIS — I10 Essential (primary) hypertension: Secondary | ICD-10-CM | POA: Diagnosis not present

## 2014-07-18 DIAGNOSIS — G8929 Other chronic pain: Secondary | ICD-10-CM | POA: Diagnosis not present

## 2014-07-18 DIAGNOSIS — I959 Hypotension, unspecified: Secondary | ICD-10-CM | POA: Diagnosis present

## 2014-07-18 DIAGNOSIS — Z7952 Long term (current) use of systemic steroids: Secondary | ICD-10-CM

## 2014-07-18 DIAGNOSIS — D649 Anemia, unspecified: Secondary | ICD-10-CM | POA: Diagnosis present

## 2014-07-18 DIAGNOSIS — Z888 Allergy status to other drugs, medicaments and biological substances status: Secondary | ICD-10-CM

## 2014-07-18 DIAGNOSIS — E861 Hypovolemia: Secondary | ICD-10-CM | POA: Diagnosis present

## 2014-07-18 DIAGNOSIS — Z885 Allergy status to narcotic agent status: Secondary | ICD-10-CM

## 2014-07-18 DIAGNOSIS — K529 Noninfective gastroenteritis and colitis, unspecified: Secondary | ICD-10-CM | POA: Diagnosis not present

## 2014-07-18 DIAGNOSIS — N179 Acute kidney failure, unspecified: Secondary | ICD-10-CM | POA: Diagnosis not present

## 2014-07-18 DIAGNOSIS — E872 Acidosis, unspecified: Secondary | ICD-10-CM | POA: Diagnosis present

## 2014-07-18 DIAGNOSIS — R6510 Systemic inflammatory response syndrome (SIRS) of non-infectious origin without acute organ dysfunction: Secondary | ICD-10-CM | POA: Diagnosis present

## 2014-07-18 DIAGNOSIS — Z981 Arthrodesis status: Secondary | ICD-10-CM

## 2014-07-18 DIAGNOSIS — I9589 Other hypotension: Secondary | ICD-10-CM

## 2014-07-18 DIAGNOSIS — M069 Rheumatoid arthritis, unspecified: Secondary | ICD-10-CM | POA: Diagnosis present

## 2014-07-18 DIAGNOSIS — K219 Gastro-esophageal reflux disease without esophagitis: Secondary | ICD-10-CM | POA: Diagnosis present

## 2014-07-18 DIAGNOSIS — J309 Allergic rhinitis, unspecified: Secondary | ICD-10-CM | POA: Diagnosis present

## 2014-07-18 DIAGNOSIS — Z8701 Personal history of pneumonia (recurrent): Secondary | ICD-10-CM

## 2014-07-18 DIAGNOSIS — K589 Irritable bowel syndrome without diarrhea: Secondary | ICD-10-CM | POA: Diagnosis present

## 2014-07-18 DIAGNOSIS — Z79899 Other long term (current) drug therapy: Secondary | ICD-10-CM

## 2014-07-18 DIAGNOSIS — R1084 Generalized abdominal pain: Secondary | ICD-10-CM

## 2014-07-18 DIAGNOSIS — E860 Dehydration: Secondary | ICD-10-CM | POA: Diagnosis present

## 2014-07-18 DIAGNOSIS — F419 Anxiety disorder, unspecified: Secondary | ICD-10-CM | POA: Diagnosis present

## 2014-07-18 DIAGNOSIS — R109 Unspecified abdominal pain: Secondary | ICD-10-CM

## 2014-07-18 DIAGNOSIS — Z8673 Personal history of transient ischemic attack (TIA), and cerebral infarction without residual deficits: Secondary | ICD-10-CM

## 2014-07-18 HISTORY — DX: Acute kidney failure, unspecified: N17.9

## 2014-07-18 LAB — CBC WITH DIFFERENTIAL/PLATELET
BASOS PCT: 0 % (ref 0–1)
Basophils Absolute: 0 10*3/uL (ref 0.0–0.1)
EOS ABS: 0.2 10*3/uL (ref 0.0–0.7)
Eosinophils Relative: 1 % (ref 0–5)
HCT: 35.3 % — ABNORMAL LOW (ref 36.0–46.0)
HEMOGLOBIN: 11.6 g/dL — AB (ref 12.0–15.0)
LYMPHS ABS: 4.1 10*3/uL — AB (ref 0.7–4.0)
Lymphocytes Relative: 21 % (ref 12–46)
MCH: 26.6 pg (ref 26.0–34.0)
MCHC: 32.9 g/dL (ref 30.0–36.0)
MCV: 81 fL (ref 78.0–100.0)
MONOS PCT: 5 % (ref 3–12)
Monocytes Absolute: 0.9 10*3/uL (ref 0.1–1.0)
Neutro Abs: 13.8 10*3/uL — ABNORMAL HIGH (ref 1.7–7.7)
Neutrophils Relative %: 73 % (ref 43–77)
PLATELETS: 494 10*3/uL — AB (ref 150–400)
RBC: 4.36 MIL/uL (ref 3.87–5.11)
RDW: 14.7 % (ref 11.5–15.5)
WBC: 19.1 10*3/uL — ABNORMAL HIGH (ref 4.0–10.5)

## 2014-07-18 LAB — COMPREHENSIVE METABOLIC PANEL
ALK PHOS: 95 U/L (ref 38–126)
ALT: 19 U/L (ref 14–54)
AST: 27 U/L (ref 15–41)
Albumin: 3.7 g/dL (ref 3.5–5.0)
Anion gap: 17 — ABNORMAL HIGH (ref 5–15)
BUN: 26 mg/dL — ABNORMAL HIGH (ref 6–20)
CO2: 22 mmol/L (ref 22–32)
Calcium: 9.6 mg/dL (ref 8.9–10.3)
Chloride: 99 mmol/L — ABNORMAL LOW (ref 101–111)
Creatinine, Ser: 1.87 mg/dL — ABNORMAL HIGH (ref 0.44–1.00)
GFR calc Af Amer: 32 mL/min — ABNORMAL LOW (ref >60)
GFR calc non Af Amer: 28 mL/min — ABNORMAL LOW (ref >60)
Glucose, Bld: 143 mg/dL — ABNORMAL HIGH (ref 70–99)
Potassium: 3.6 mmol/L (ref 3.5–5.1)
Sodium: 138 mmol/L (ref 135–145)
TOTAL PROTEIN: 6.6 g/dL (ref 6.5–8.1)
Total Bilirubin: 0.7 mg/dL (ref 0.3–1.2)

## 2014-07-18 LAB — URINALYSIS, ROUTINE W REFLEX MICROSCOPIC
Bilirubin Urine: NEGATIVE
Glucose, UA: NEGATIVE mg/dL
HGB URINE DIPSTICK: NEGATIVE
Ketones, ur: NEGATIVE mg/dL
NITRITE: NEGATIVE
Protein, ur: NEGATIVE mg/dL
SPECIFIC GRAVITY, URINE: 1.014 (ref 1.005–1.030)
Urobilinogen, UA: 0.2 mg/dL (ref 0.0–1.0)
pH: 6 (ref 5.0–8.0)

## 2014-07-18 LAB — URINE MICROSCOPIC-ADD ON

## 2014-07-18 LAB — I-STAT CG4 LACTIC ACID, ED
LACTIC ACID, VENOUS: 4.47 mmol/L — AB (ref 0.5–2.0)
Lactic Acid, Venous: 4.05 mmol/L (ref 0.5–2.0)

## 2014-07-18 LAB — POC URINE PREG, ED: Preg Test, Ur: NEGATIVE

## 2014-07-18 LAB — I-STAT TROPONIN, ED: Troponin i, poc: 0 ng/mL (ref 0.00–0.08)

## 2014-07-18 LAB — MRSA PCR SCREENING: MRSA BY PCR: NEGATIVE

## 2014-07-18 MED ORDER — PANTOPRAZOLE SODIUM 40 MG PO TBEC: 40.0000 mg | DELAYED_RELEASE_TABLET | Freq: Every day | ORAL | Status: DC

## 2014-07-18 MED ORDER — FLUTICASONE PROPIONATE 50 MCG/ACT NA SUSP
2.0000 | Freq: Two times a day (BID) | NASAL | Status: DC
  Administered 2014-07-18 – 2014-07-21 (×6): 2 via NASAL
  Filled 2014-07-18 (×2): qty 16

## 2014-07-18 MED ORDER — ATORVASTATIN CALCIUM 20 MG PO TABS
20.0000 mg | ORAL_TABLET | Freq: Every day | ORAL | Status: DC
  Administered 2014-07-19 – 2014-07-21 (×3): 20 mg via ORAL
  Filled 2014-07-18 (×4): qty 1

## 2014-07-18 MED ORDER — SODIUM CHLORIDE 0.9 % IV BOLUS (SEPSIS)
1000.0000 mL | Freq: Once | INTRAVENOUS | Status: AC
  Administered 2014-07-18: 1000 mL via INTRAVENOUS

## 2014-07-18 MED ORDER — PIPERACILLIN-TAZOBACTAM 3.375 G IVPB
3.3750 g | Freq: Three times a day (TID) | INTRAVENOUS | Status: DC
  Administered 2014-07-19 – 2014-07-21 (×8): 3.375 g via INTRAVENOUS
  Filled 2014-07-18 (×14): qty 50

## 2014-07-18 MED ORDER — PREDNISONE 10 MG PO TABS
10.0000 mg | ORAL_TABLET | Freq: Every day | ORAL | Status: DC
  Administered 2014-07-19 – 2014-07-21 (×3): 10 mg via ORAL
  Filled 2014-07-18 (×3): qty 1

## 2014-07-18 MED ORDER — ALPRAZOLAM 0.5 MG PO TABS
1.0000 mg | ORAL_TABLET | Freq: Every day | ORAL | Status: DC
  Administered 2014-07-18 – 2014-07-20 (×3): 1 mg via ORAL
  Filled 2014-07-18 (×3): qty 2

## 2014-07-18 MED ORDER — HEPARIN SODIUM (PORCINE) 5000 UNIT/ML IJ SOLN
5000.0000 [IU] | Freq: Three times a day (TID) | INTRAMUSCULAR | Status: DC
  Administered 2014-07-18 – 2014-07-21 (×9): 5000 [IU] via SUBCUTANEOUS
  Filled 2014-07-18 (×11): qty 1

## 2014-07-18 MED ORDER — METHYLPREDNISOLONE SODIUM SUCC 125 MG IJ SOLR
125.0000 mg | Freq: Once | INTRAMUSCULAR | Status: AC
  Administered 2014-07-18: 125 mg via INTRAVENOUS
  Filled 2014-07-18: qty 2

## 2014-07-18 MED ORDER — VITAMIN B-1 100 MG PO TABS
100.0000 mg | ORAL_TABLET | Freq: Every day | ORAL | Status: DC
  Administered 2014-07-18 – 2014-07-21 (×4): 100 mg via ORAL
  Filled 2014-07-18 (×4): qty 1

## 2014-07-18 MED ORDER — OXYCODONE-ACETAMINOPHEN 5-325 MG PO TABS
1.0000 | ORAL_TABLET | Freq: Four times a day (QID) | ORAL | Status: DC | PRN
  Administered 2014-07-18 – 2014-07-21 (×4): 1 via ORAL
  Filled 2014-07-18 (×4): qty 1

## 2014-07-18 MED ORDER — MORPHINE SULFATE 4 MG/ML IJ SOLN
4.0000 mg | Freq: Once | INTRAMUSCULAR | Status: AC
  Administered 2014-07-19: 4 mg via INTRAVENOUS
  Filled 2014-07-18: qty 1

## 2014-07-18 MED ORDER — FOLIC ACID 1 MG PO TABS
1.0000 mg | ORAL_TABLET | Freq: Every day | ORAL | Status: DC
  Administered 2014-07-18 – 2014-07-21 (×4): 1 mg via ORAL
  Filled 2014-07-18 (×4): qty 1

## 2014-07-18 MED ORDER — SUCRALFATE 1 G PO TABS
1.0000 g | ORAL_TABLET | Freq: Two times a day (BID) | ORAL | Status: DC | PRN
  Filled 2014-07-18: qty 1

## 2014-07-18 MED ORDER — ASPIRIN EC 81 MG PO TBEC
81.0000 mg | DELAYED_RELEASE_TABLET | Freq: Every day | ORAL | Status: DC
  Administered 2014-07-18 – 2014-07-21 (×4): 81 mg via ORAL
  Filled 2014-07-18 (×4): qty 1

## 2014-07-18 MED ORDER — VANCOMYCIN HCL 10 G IV SOLR
1500.0000 mg | Freq: Once | INTRAVENOUS | Status: AC
  Administered 2014-07-18: 1500 mg via INTRAVENOUS
  Filled 2014-07-18: qty 1500

## 2014-07-18 MED ORDER — SODIUM CHLORIDE 0.9 % IJ SOLN
3.0000 mL | Freq: Two times a day (BID) | INTRAMUSCULAR | Status: DC
  Administered 2014-07-18 – 2014-07-20 (×5): 3 mL via INTRAVENOUS

## 2014-07-18 MED ORDER — FENTANYL CITRATE (PF) 100 MCG/2ML IJ SOLN
25.0000 ug | Freq: Once | INTRAMUSCULAR | Status: AC
  Administered 2014-07-18: 25 ug via INTRAVENOUS
  Filled 2014-07-18: qty 2

## 2014-07-18 MED ORDER — ADULT MULTIVITAMIN W/MINERALS CH
1.0000 | ORAL_TABLET | Freq: Every day | ORAL | Status: DC
  Administered 2014-07-18 – 2014-07-21 (×4): 1 via ORAL
  Filled 2014-07-18 (×4): qty 1

## 2014-07-18 MED ORDER — LORAZEPAM 1 MG PO TABS
0.5000 mg | ORAL_TABLET | Freq: Once | ORAL | Status: AC
  Administered 2014-07-18: 0.5 mg via ORAL
  Filled 2014-07-18: qty 1

## 2014-07-18 MED ORDER — VANCOMYCIN HCL 10 G IV SOLR
1250.0000 mg | INTRAVENOUS | Status: DC
  Administered 2014-07-19 – 2014-07-20 (×2): 1250 mg via INTRAVENOUS
  Filled 2014-07-18 (×3): qty 1250

## 2014-07-18 MED ORDER — SODIUM CHLORIDE 0.9 % IV SOLN
INTRAVENOUS | Status: DC
  Administered 2014-07-18 – 2014-07-20 (×2): via INTRAVENOUS

## 2014-07-18 NOTE — Progress Notes (Signed)
ANTIBIOTIC CONSULT NOTE - INITIAL  Pharmacy Consult for Vancomycin and Zosyn Indication: R/O sepsis.  Intra-abdominal infection  Allergies  Allergen Reactions  . Methotrexate Derivatives Other (See Comments)    Causes recurrent infections  . Pseudoephedrine Other (See Comments)    Makes jittery   . Reglan [Metoclopramide] Other (See Comments)    insomnia  . Vicodin [Hydrocodone-Acetaminophen] Other (See Comments)    "Makes me feel weird"    Patient Measurements:   Adjusted Body Weight:   Vital Signs: Temp: 99.3 F (37.4 C) (05/03 1900) BP: 149/65 mmHg (05/03 1900) Pulse Rate: 108 (05/03 1900) Intake/Output from previous day:   Intake/Output from this shift:    Labs:  Recent Labs  07/18/14 1530  WBC 19.1*  HGB 11.6*  PLT 494*  CREATININE 1.87*   Estimated Creatinine Clearance: 33.6 mL/min (by C-G formula based on Cr of 1.87). No results for input(s): VANCOTROUGH, VANCOPEAK, VANCORANDOM, GENTTROUGH, GENTPEAK, GENTRANDOM, TOBRATROUGH, TOBRAPEAK, TOBRARND, AMIKACINPEAK, AMIKACINTROU, AMIKACIN in the last 72 hours.   Microbiology: Recent Results (from the past 720 hour(s))  Surgical pcr screen     Status: None   Collection Time: 06/30/14  2:56 PM  Result Value Ref Range Status   MRSA, PCR NEGATIVE NEGATIVE Final   Staphylococcus aureus NEGATIVE NEGATIVE Final    Comment:        The Xpert SA Assay (FDA approved for NASAL specimens in patients over 67 years of age), is one component of a comprehensive surveillance program.  Test performance has been validated by [facility] for patients greater than or equal to 26 year old. It is not intended to diagnose infection nor to guide or monitor treatment.   Anaerobic culture     Status: None   Collection Time: 07/05/14  9:05 AM  Result Value Ref Range Status   Specimen Description WOUND BACK  Final   Special Requests LUMBAR  Final   Gram Stain   Final    RARE WBC PRESENT, PREDOMINANTLY MONONUCLEAR NO SQUAMOUS  EPITHELIAL CELLS SEEN NO ORGANISMS SEEN Performed at [facility] Performed at [facility]    Culture   Final    NO ANAEROBES ISOLATED Performed at [facility]    Report Status 07/10/2014 FINAL  Final  Gram stain     Status: None   Collection Time: 07/05/14  9:05 AM  Result Value Ref Range Status   Specimen Description WOUND BACK  Final   Special Requests LUMBAR  Final   Gram Stain   Final    RARE WBC PRESENT, PREDOMINANTLY MONONUCLEAR NO ORGANISMS SEEN    Report Status 07/05/2014 FINAL  Final  Wound culture     Status: None   Collection Time: 07/05/14  9:05 AM  Result Value Ref Range Status   Specimen Description WOUND BACK  Final   Special Requests LUMBAR  Final   Gram Stain   Final    RARE WBC PRESENT, PREDOMINANTLY MONONUCLEAR NO SQUAMOUS EPITHELIAL CELLS SEEN NO ORGANISMS SEEN Performed at [facility] Performed at [place]   Final    NO GROWTH 2 DAYS Performed at [facility]    Report Status 07/07/2014 FINAL  Final    Medical History: Past Medical History  Diagnosis Date  . Hypertension   . Acid reflux   . Bronchitis     CHRONIC   . Anxiety     PT'S SON DIED 07/03/2010  . Pneumonia     X 3  -  LAST TIME WAS 2013  . H/O hiatal hernia   . Arthritis     RA AND OA  . Pain     RIGHT KNEE - TORN MENISCUS AND ACL  . Stroke 03/2011    TIA--PT EXPERIENCED NUMBNESS RT HAND AND ARM , HEADACHE AND NAUSEA. NO RESIDUAL PROBLEMS  . Borderline hypercholesterolemia     on medication for TIA  . Environmental allergies   . PONV (postoperative nausea and vomiting) 09-2012    sore throat after ankle surgery 6 yrs ago and severe ponv 09-2012  . Allergic rhinitis   . GERD (gastroesophageal reflux disease)     on PPI and carafte prn  . IBS (irritable bowel syndrome)   . LBP (low back pain)   . Pulmonary nodule seen on imaging study   . OA (osteoarthritis)     Left Shoulder  . Colon polyp   . Shortness of breath      with activity,bronchitis    Medications:  Scheduled:  . ALPRAZolam  1 mg Oral QHS  . aspirin EC  81 mg Oral Daily  . [START ON 07/19/2014] atorvastatin  20 mg Oral Q0600  . fluticasone  2 spray Each Nare BID  . folic acid  1 mg Oral Daily  . heparin  5,000 Units Subcutaneous 3 times per day  . multivitamin with minerals  1 tablet Oral Daily  . [START ON 07/19/2014] pantoprazole  40 mg Oral Daily  . [START ON 07/19/2014] predniSONE  10 mg Oral Daily  . sodium chloride  1,000 mL Intravenous Once  . sodium chloride  3 mL Intravenous Q12H  . thiamine  100 mg Oral Daily   Assessment: 64yo female admitted with weakness, chills, and hypotension from MD office.  She had a lumbar fusion done on 07/05/14.   Her Cr is elevated at 1.87, AKI reflective of dehydration.  WBC is 19.1 (prednisone pta) and LA 4.47.  Blood and urine cultures have been collected.  She is to have a CT of her abdomen as well.  CXR (-).  She has received no doses of antibiotics.  Goal of Therapy:  Vancomycin trough level 15-20 mcg/ml  Plan:  Vancomycin 1500mg  IV x 1, then 1250mg  IV q24 Zosyn 3.375g IV q8, infuse over 4hr F/U cultures Trend clinical status Check ss Vancomcyin trough  Gracy Bruins, [facility]

## 2014-07-18 NOTE — ED Provider Notes (Signed)
CSN: [id number]     Arrival date & time 07/18/14  1525 History   First MD Initiated Contact with Patient 07/18/14 1531     Chief Complaint  Patient presents with  . Hypotension   (Consider location/radiation/quality/duration/timing/severity/associated sxs/prior Treatment) Patient is a 64 y.o. female presenting with hypertension. The history is provided by the patient and the EMS personnel. No language interpreter was used.  Hypertension This is a new (hypotension) problem. The current episode started today. The problem has been unchanged. Associated symptoms include abdominal pain, chills, fatigue and nausea. Pertinent negatives include no change in bowel habit, chest pain, congestion, coughing, fever, headaches, myalgias, urinary symptoms, vomiting or weakness. Nothing aggravates the symptoms. She has tried nothing for the symptoms.    Past Medical History  Diagnosis Date  . Hypertension   . Acid reflux   . Bronchitis     CHRONIC   . Anxiety     PT'S SON DIED 06/22/10  . Pneumonia     X 3  - LAST TIME WAS 06-22-11  . H/O hiatal hernia   . Arthritis     RA AND OA  . Pain     RIGHT KNEE - TORN MENISCUS AND ACL  . Stroke 03/2011    TIA--PT EXPERIENCED NUMBNESS RT HAND AND ARM , HEADACHE AND NAUSEA. NO RESIDUAL PROBLEMS  . Borderline hypercholesterolemia     on medication for TIA  . Environmental allergies   . PONV (postoperative nausea and vomiting) 09-2012    sore throat after ankle surgery 6 yrs ago and severe ponv 09-2012  . Allergic rhinitis   . GERD (gastroesophageal reflux disease)     on PPI and carafte prn  . IBS (irritable bowel syndrome)   . LBP (low back pain)   . Pulmonary nodule seen on imaging study   . OA (osteoarthritis)     Left Shoulder  . Colon polyp   . Shortness of breath     with activity,bronchitis   Past Surgical History  Procedure Laterality Date  . Ankle surgery  ~10 years ago    left  . Shoulder surgery Left ~14 years ago    "scope"  . Thumb surgery  Left ~14 years ago    x2  . Colonoscopy  3875,6433, 06-22-2011  . Trigger finger release  ~6 years ago    X2 ON RIGHT HAND  . Knee arthroscopy Right 10/13/2012    Procedure: RIGHT KNEE ARTHROSCOPY WITH DEBRIDEMENT, CHONDROPLASTY;  Surgeon: Gearlean Alf, MD;  Location: [facility];  Service: Orthopedics;  Laterality: Right;  . Back surgery  2009-06-21    lower  . Knee arthroscopy Right 03/02/2013    Procedure: RIGHT ARTHROSCOPY KNEE WITH MEDIAL MENISCAL  DEBRIDEMENT;  Surgeon: Gearlean Alf, MD;  Location: [facility];  Service: Orthopedics;  Laterality: Right;  . Cholecystectomy  ~15 years ago  . Root canal    . Tubal ligation    . Total knee arthroplasty Right 07/25/2013    Procedure: RIGHT TOTAL KNEE ARTHROPLASTY;  Surgeon: Gearlean Alf, MD;  Location: [facility];  Service: Orthopedics;  Laterality: Right;  . Maximum access (mas)posterior lumbar interbody fusion (plif) 1 level N/A 12/05/2013    Procedure: FOR MAXIMUM ACCESS (MAS) POSTERIOR LUMBAR INTERBODY FUSION (PLIF)LUMBAR FIVE-SACRAL-ONE,REMOVAL HARDWARE LUMBAR FOUR-FIVE;  Surgeon: Elaina Hoops, MD;  Location: [place] [facility];  Service: Neurosurgery;  Laterality: N/A;  . Lumbar wound debridement N/A 01/11/2014    Procedure: Irrigation and Debridement Lumbar Wound for hematoma;  Surgeon:  Elaina Hoops, MD;  Location: [place] [facility];  Service: Neurosurgery;  Laterality: N/A;  . Lumbar fusion  07/05/2014  . Harvest bone graft N/A 07/05/2014    Procedure: HARVEST ILIAC BONE GRAFT;  Surgeon: Kary Kos, MD;  Location: [place] [facility];  Service: Neurosurgery;  Laterality: N/A;   Family History  Problem Relation Age of Onset  . Colon cancer Neg Hx   . Heart disease Sister   . Lung cancer Brother   . Heart disease Brother   . CAD Brother   . Heart attack Mother   . Emphysema Father   . Hypertension Daughter    History  Substance Use Topics  . Smoking status: Never Smoker   . Smokeless tobacco: Never Used  . Alcohol Use: Yes     Comment: RARE    OB History     No data available     Review of Systems  Constitutional: Positive for chills and fatigue. Negative for fever.  HENT: Negative for congestion.   Respiratory: Negative for cough, chest tightness and shortness of breath.   Cardiovascular: Negative for chest pain.  Gastrointestinal: Positive for nausea, abdominal pain and constipation. Negative for vomiting, diarrhea and change in bowel habit.  Musculoskeletal: Positive for back pain. Negative for myalgias.  Neurological: Negative for weakness, light-headedness and headaches.  Psychiatric/Behavioral: Negative for confusion.  All other systems reviewed and are negative.   Allergies  Pseudoephedrine; Reglan; Vicodin; and Methotrexate derivatives  Home Medications   Prior to Admission medications   Medication Sig Start Date End Date Taking? Authorizing Provider  ALPRAZolam Duanne Moron) 1 MG tablet Take 1 mg by mouth at bedtime.     Historical Provider, MD  atorvastatin (LIPITOR) 20 MG tablet Take 20 mg by mouth daily at 6 (six) AM.  05/20/11   Historical Provider, MD  cetirizine (ZYRTEC) 10 MG tablet Take 10 mg by mouth daily.    Historical Provider, MD  cyclobenzaprine (FLEXERIL) 10 MG tablet Take 1 tablet (10 mg total) by mouth 3 (three) times daily as needed for muscle spasms. 07/09/14   Erline Levine, MD  Esomeprazole Magnesium (NEXIUM PO) Take 1 tablet by mouth daily.     Historical Provider, MD  fluticasone (FLONASE) 50 MCG/ACT nasal spray Place 2 sprays into both nostrils 2 (two) times daily.    Historical Provider, MD  lisinopril (PRINIVIL,ZESTRIL) 10 MG tablet Take 10 mg by mouth daily. 05/24/14   Historical Provider, MD  naproxen sodium (ANAPROX) 220 MG tablet Take 220 mg by mouth 2 (two) times daily as needed (pain). ALEVE    Historical Provider, MD  nortriptyline (PAMELOR) 25 MG capsule Take 25 mg by mouth at bedtime.  06/16/14   Historical Provider, MD  oxyCODONE-acetaminophen (PERCOCET/ROXICET) 5-325 MG per tablet Take 1 tablet by mouth  every 6 (six) hours as needed for moderate pain or severe pain.     Historical Provider, MD  oxyCODONE-acetaminophen (PERCOCET/ROXICET) 5-325 MG per tablet Take 1-2 tablets by mouth every 4 (four) hours as needed for moderate pain or severe pain. 07/09/14   Erline Levine, MD  predniSONE (DELTASONE) 5 MG tablet Take 10 mg by mouth daily. Continuous for rheumatoid arthritis    Historical Provider, MD  promethazine (PHENERGAN) 25 MG tablet Take 25 mg by mouth 2 (two) times daily as needed for nausea or vomiting.  04/06/14   Historical Provider, MD  sucralfate (CARAFATE) 1 G tablet Take 1 g by mouth 2 (two) times daily as needed (acid reflux).  Historical Provider, MD  triamterene-hydrochlorothiazide (MAXZIDE-25) 37.5-25 MG per tablet Take 1 tablet by mouth daily.    Historical Provider, MD   ED Triage Vitals  Enc Vitals Group     BP 07/18/14 1531 88/60 mmHg     Pulse Rate 07/18/14 1531 104     Resp 07/18/14 1531 20     Temp 07/18/14 1611 97.5 F (36.4 C)     Temp Source 07/18/14 2003 Oral     SpO2 07/18/14 1531 100 %     Weight 07/18/14 2000 200 lb 2.8 oz (90.8 kg)     Height 07/18/14 2000 5\' 5"  (1.651 m)     Head Cir --      Peak Flow --      Pain Score 07/18/14 1529 8     Pain Loc --      Pain Edu? --      Excl. in GC? --     BP 100/54 mmHg  Pulse 97  Temp(Src) 97.5 F (36.4 C)  Resp 17  SpO2 100%   Physical Exam  Constitutional: She appears well-developed and well-nourished. She is cooperative.  Non-toxic appearance. No distress.  HENT:  Head: Normocephalic and atraumatic.  Nose: Nose normal.  Mouth/Throat: Oropharynx is clear and moist. No oropharyngeal exudate.  Eyes: EOM are normal. Pupils are equal, round, and reactive to light.  Neck: Normal range of motion. Neck supple.  Cardiovascular: Regular rhythm, normal heart sounds and intact distal pulses.  Tachycardia present.   No murmur heard. Pulmonary/Chest: Effort normal and breath sounds normal. No respiratory distress.  She has no wheezes. She exhibits no tenderness.  Abdominal: Soft. There is tenderness (mild diffuse). There is no rebound and no guarding.  Musculoskeletal: Normal range of motion. She exhibits no tenderness.  Midline lower spinal incision is healing well, nontender, no surrounding erythema, no drainage.    Lymphadenopathy:    She has no cervical adenopathy.  Neurological: She is alert. No cranial nerve deficit. Coordination normal.  Skin: Skin is warm and dry. She is not diaphoretic.  Psychiatric: Her behavior is normal. Judgment and thought content normal. Her mood appears anxious.  Nursing note and vitals reviewed.   ED Course  Procedures (including critical care time) Labs Review Labs Reviewed  COMPREHENSIVE METABOLIC PANEL - Abnormal; Notable for the following:    Chloride 99 (*)    Glucose, Bld 143 (*)    BUN 26 (*)    Creatinine, Ser 1.87 (*)    GFR calc non Af Amer 28 (*)    GFR calc Af Amer 32 (*)    Anion gap 17 (*)    All other components within normal limits  CBC WITH DIFFERENTIAL/PLATELET - Abnormal; Notable for the following:    WBC 19.1 (*)    Hemoglobin 11.6 (*)    HCT 35.3 (*)    Platelets 494 (*)    Neutro Abs 13.8 (*)    Lymphs Abs 4.1 (*)    All other components within normal limits  I-STAT CG4 LACTIC ACID, ED - Abnormal; Notable for the following:    Lactic Acid, Venous 4.05 (*)    All other components within normal limits  URINE CULTURE  CULTURE, BLOOD (ROUTINE X 2)  CULTURE, BLOOD (ROUTINE X 2)  URINALYSIS, ROUTINE W REFLEX MICROSCOPIC  POC URINE PREG, ED  I-STAT TROPOININ, ED    Imaging Review Dg Chest Portable 1 View  07/18/2014   CLINICAL DATA:  Nausea and vomiting  EXAM: PORTABLE CHEST -  1 VIEW  COMPARISON:  11/25/13  FINDINGS: The heart size and mediastinal contours are within normal limits. Both lungs are clear. The visualized skeletal structures are unremarkable.  IMPRESSION: No active disease.   Electronically Signed   By: Inez Catalina M.D.    On: 07/18/2014 15:45     EKG Interpretation   Date/Time:  Tuesday Jul 18 2014 15:29:32 EDT Ventricular Rate:  103 PR Interval:  129 QRS Duration: 93 QT Interval:  349 QTC Calculation: 457 R Axis:   70 Text Interpretation:  Sinus tachycardia Confirmed by BEATON  MD, ROBERT  ([id number]) on 07/18/2014 3:32:02 PM      MDM   Final diagnoses:  None   Pt is a 64 yo F with hx of HTN, chronic back pain s/p lumbar fusion 2 weeks ago, who presents with hypotension and back pain.  Was at a rehab facility this afternoon when she complained of chills, they took her BP and it was "too low to register".  She still had pulses and was GCS 15.  EMS then brought her to [facility] ED for further work up. She complained of mild chronic abd pain with associated nausea, chills, fatigue, and lightheadedness.  Denies chest pain or back pain.   Still taking narcotics occasionally for her spinal surgery, most recently several hours ago.   Takes prednisone 10 mg daily for RA.  Previously was on methotrexate but stopped that 2/2 her spinal surgeries.  Last took the steroids yesterday morning.  Didn't take today's dose due to driving into town for the clinic appointment today. Has been on steroids daily for 3 months. Leukocytosis today could be 2/2 steroids as it was also elevated on her most recent blood work earlier this month, but was normal last year before starting the meds.  Given solu-medrol here to cover.   BP upper 790X systolic now.  Given fentanyl for continued abdominal pain.  Reports it feels like she needs to have a BM an the abd pain is similar to her chronic IBS pain.   Labs show + lactic acidosis and mild AKI Given 2L NS.    No signs of infection - afebrile, lungs clear, urine negative.  Spinal incision site looks good.   Will admit for continued IVF and monitoring.  Spoke to Dr. Humphrey Rolls at [phone] with hospitalist team who is agreeable to admission.   Tori Milks, MD 07/20/14 [id number]  Leonard Schwartz,  MD 08/03/14 [phone]

## 2014-07-18 NOTE — ED Notes (Signed)
Attempted second IV stick in right hand with blood return but unable to advance.

## 2014-07-18 NOTE — H&P (Signed)
[facility] History and Physical  Angie Olson PJA:[id number] DOB: 21-Sep-1950 DOA: 07/18/2014  Referring physician: Tori Milks, MD PCP: Dorian Heckle, MD   Chief Complaint: Hypotension  HPI: Angie Olson is a 64 y.o. female presents with hypotension. Patient has a history of IBS HTN Chronic Pain GERD RA. Patient states that she had back surgery about 2 weeks and has been experiencing pain from this. She states that she has also had some issues with not being able to get her stool out. She states that normally she has IBS and has diarrhea. Today while at her doctors office she states that she felt weak and she also states taht she had some chills. At the time they checked her blood pressure and it was noted to be too low to register. She has as noted above a history of Hypertension and is on diuretics for this. She has been having poor intake also. She was sent to the ED and was noted to have a SBP of 88. She was given fluids and has had a good response with some improvement of her blood pressure. She had been complaining of being dizzy and light headed also. She is currently complaining of abdominal pain due to not being able to go to the bathroom. She has no vomtiing and no nausea noted there is no chest pain noted.   Review of Systems:  Constitutional:  No weight loss, night sweats, +chills, +fatigue.  HEENT:  No headaches, No sneezing, itching, ear ache, nasal congestion, post nasal drip,  Cardio-vascular:  No chest pain, swelling in lower extremities, +dizziness  GI:  No heartburn, indigestion, +abdominal pain, no nausea, vomiting  Resp:  No shortness of breath with exertion or at rest. No coughing up of blood.  Skin:  no rash or lesions.  GU:  no dysuria, change in color of urine  Musculoskeletal:  ++rheumatoid arthritis and back pain Psych:  No change in mood or affect. No depression or anxiety  Past Medical History  Diagnosis Date  . Hypertension   . Acid  reflux   . Bronchitis     CHRONIC   . Anxiety     PT'S SON DIED June 22, 2010  . Pneumonia     X 3  - LAST TIME WAS 2011/06/22  . H/O hiatal hernia   . Arthritis     RA AND OA  . Pain     RIGHT KNEE - TORN MENISCUS AND ACL  . Stroke 03/2011    TIA--PT EXPERIENCED NUMBNESS RT HAND AND ARM , HEADACHE AND NAUSEA. NO RESIDUAL PROBLEMS  . Borderline hypercholesterolemia     on medication for TIA  . Environmental allergies   . PONV (postoperative nausea and vomiting) 09-2012    sore throat after ankle surgery 6 yrs ago and severe ponv 09-2012  . Allergic rhinitis   . GERD (gastroesophageal reflux disease)     on PPI and carafte prn  . IBS (irritable bowel syndrome)   . LBP (low back pain)   . Pulmonary nodule seen on imaging study   . OA (osteoarthritis)     Left Shoulder  . Colon polyp   . Shortness of breath     with activity,bronchitis   Past Surgical History  Procedure Laterality Date  . Ankle surgery  ~10 years ago    left  . Shoulder surgery Left ~14 years ago    "scope"  . Thumb surgery Left ~14 years ago    x2  . Colonoscopy  2979,8921, 2013  . Trigger finger release  ~6 years ago    X2 ON RIGHT HAND  . Knee arthroscopy Right 10/13/2012    Procedure: RIGHT KNEE ARTHROSCOPY WITH DEBRIDEMENT, CHONDROPLASTY;  Surgeon: Gearlean Alf, MD;  Location: [facility];  Service: Orthopedics;  Laterality: Right;  . Back surgery  2011    lower  . Knee arthroscopy Right 03/02/2013    Procedure: RIGHT ARTHROSCOPY KNEE WITH MEDIAL MENISCAL  DEBRIDEMENT;  Surgeon: Gearlean Alf, MD;  Location: [facility];  Service: Orthopedics;  Laterality: Right;  . Cholecystectomy  ~15 years ago  . Root canal    . Tubal ligation    . Total knee arthroplasty Right 07/25/2013    Procedure: RIGHT TOTAL KNEE ARTHROPLASTY;  Surgeon: Gearlean Alf, MD;  Location: [facility];  Service: Orthopedics;  Laterality: Right;  . Maximum access (mas)posterior lumbar interbody fusion (plif) 1 level N/A 12/05/2013    Procedure: FOR  MAXIMUM ACCESS (MAS) POSTERIOR LUMBAR INTERBODY FUSION (PLIF)LUMBAR FIVE-SACRAL-ONE,REMOVAL HARDWARE LUMBAR FOUR-FIVE;  Surgeon: Elaina Hoops, MD;  Location: [place] [facility];  Service: Neurosurgery;  Laterality: N/A;  . Lumbar wound debridement N/A 01/11/2014    Procedure: Irrigation and Debridement Lumbar Wound for hematoma;  Surgeon: Elaina Hoops, MD;  Location: [place] [facility];  Service: Neurosurgery;  Laterality: N/A;  . Lumbar fusion  07/05/2014  . Harvest bone graft N/A 07/05/2014    Procedure: HARVEST ILIAC BONE GRAFT;  Surgeon: Kary Kos, MD;  Location: [place] [facility];  Service: Neurosurgery;  Laterality: N/A;   Social History:  reports that she has never smoked. She has never used smokeless tobacco. She reports that she drinks alcohol. She reports that she does not use illicit drugs.  Allergies  Allergen Reactions  . Pseudoephedrine Other (See Comments)    Makes jittery   . Reglan [Metoclopramide] Other (See Comments)    insomnia  . Vicodin [Hydrocodone-Acetaminophen] Other (See Comments)    "Makes me feel weird"  . Methotrexate Derivatives Other (See Comments)    Causes recurrent infections    Family History  Problem Relation Age of Onset  . Colon cancer Neg Hx   . Heart disease Sister   . Lung cancer Brother   . Heart disease Brother   . CAD Brother   . Heart attack Mother   . Emphysema Father   . Hypertension Daughter      Prior to Admission medications   Medication Sig Start Date End Date Taking? Authorizing Provider  ALPRAZolam Duanne Moron) 1 MG tablet Take 1 mg by mouth at bedtime.     Historical Provider, MD  atorvastatin (LIPITOR) 20 MG tablet Take 20 mg by mouth daily at 6 (six) AM.  05/20/11   Historical Provider, MD  cetirizine (ZYRTEC) 10 MG tablet Take 10 mg by mouth daily.    Historical Provider, MD  cyclobenzaprine (FLEXERIL) 10 MG tablet Take 1 tablet (10 mg total) by mouth 3 (three) times daily as needed for muscle spasms. 07/09/14   Erline Levine, MD  Esomeprazole  Magnesium (NEXIUM PO) Take 1 tablet by mouth daily.     Historical Provider, MD  fluticasone (FLONASE) 50 MCG/ACT nasal spray Place 2 sprays into both nostrils 2 (two) times daily.    Historical Provider, MD  lisinopril (PRINIVIL,ZESTRIL) 10 MG tablet Take 10 mg by mouth daily. 05/24/14   Historical Provider, MD  naproxen sodium (ANAPROX) 220 MG tablet Take 220 mg by mouth 2 (two) times daily as needed (pain). ALEVE  Historical Provider, MD  nortriptyline (PAMELOR) 25 MG capsule Take 25 mg by mouth at bedtime.  06/16/14   Historical Provider, MD  oxyCODONE-acetaminophen (PERCOCET/ROXICET) 5-325 MG per tablet Take 1 tablet by mouth every 6 (six) hours as needed for moderate pain or severe pain.     Historical Provider, MD  oxyCODONE-acetaminophen (PERCOCET/ROXICET) 5-325 MG per tablet Take 1-2 tablets by mouth every 4 (four) hours as needed for moderate pain or severe pain. 07/09/14   Erline Levine, MD  predniSONE (DELTASONE) 5 MG tablet Take 10 mg by mouth daily. Continuous for rheumatoid arthritis    Historical Provider, MD  promethazine (PHENERGAN) 25 MG tablet Take 25 mg by mouth 2 (two) times daily as needed for nausea or vomiting.  04/06/14   Historical Provider, MD  sucralfate (CARAFATE) 1 G tablet Take 1 g by mouth 2 (two) times daily as needed (acid reflux).     Historical Provider, MD  triamterene-hydrochlorothiazide (MAXZIDE-25) 37.5-25 MG per tablet Take 1 tablet by mouth daily.    Historical Provider, MD   Physical Exam: Filed Vitals:   07/18/14 1611 07/18/14 1645 07/18/14 1800 07/18/14 1815  BP:  117/40 119/53 120/58  Pulse: 97 99 100 102  Temp: 97.5 F (36.4 C) 97.5 F (36.4 C) 98.2 F (36.8 C) 98.4 F (36.9 C)  Resp: 17 19 17 19   SpO2: 100% 100% 100% 100%    Wt Readings from Last 3 Encounters:  07/05/14 87.5 kg (192 lb 14.4 oz)  01/11/14 90.8 kg (200 lb 2.8 oz)  12/05/13 90.81 kg (200 lb 3.2 oz)    General:  Appears calm and comfortable Eyes: PERRL, normal lids, irises &  conjunctiva ENT: grossly normal hearing, lips & tongue Neck: no LAD, masses or thyromegaly Cardiovascular: RRR, no m/r/g. No LE edema. Respiratory: CTA bilaterally, no w/r/r. Normal respiratory effort. Abdomen: soft, non-localized central belly pain no rebound Skin: no rash or induration seen on limited exam Musculoskeletal: grossly normal tone BUE/BLE Psychiatric: grossly normal mood and affect Neurologic: grossly non-focal.          Labs on Admission:  Basic Metabolic Panel:  Recent Labs Lab 07/18/14 1530  NA 138  K 3.6  CL 99*  CO2 22  GLUCOSE 143*  BUN 26*  CREATININE 1.87*  CALCIUM 9.6   Liver Function Tests:  Recent Labs Lab 07/18/14 1530  AST 27  ALT 19  ALKPHOS 95  BILITOT 0.7  PROT 6.6  ALBUMIN 3.7   No results for input(s): LIPASE, AMYLASE in the last 168 hours. No results for input(s): AMMONIA in the last 168 hours. CBC:  Recent Labs Lab 07/18/14 1530  WBC 19.1*  NEUTROABS 13.8*  HGB 11.6*  HCT 35.3*  MCV 81.0  PLT 494*   Cardiac Enzymes: No results for input(s): CKTOTAL, CKMB, CKMBINDEX, TROPONINI in the last 168 hours.  BNP (last 3 results) No results for input(s): BNP in the last 8760 hours.  ProBNP (last 3 results) No results for input(s): PROBNP in the last 8760 hours.  CBG: No results for input(s): GLUCAP in the last 168 hours.  Radiological Exams on Admission: Dg Chest Portable 1 View  07/18/2014   CLINICAL DATA:  Nausea and vomiting  EXAM: PORTABLE CHEST - 1 VIEW  COMPARISON:  11/25/13  FINDINGS: The heart size and mediastinal contours are within normal limits. Both lungs are clear. The visualized skeletal structures are unremarkable.  IMPRESSION: No active disease.   Electronically Signed   By: Inez Catalina M.D.   On:  07/18/2014 15:45      Assessment/Plan Principal Problem:   Hypovolemia dehydration Active Problems:   HTN (hypertension)   Hypotension arterial   AKI (acute kidney injury)   Chronic pain   Hypotension  (arterial)   1. Hypotension secondary to dehydration -will admit for observation -will continue with IV fluids as she appears to be dehydrated -I have also ordered urine and blood cultures currently does not appear to be infected -monitor pressures  2. AKI with dehydration -will repeat labs in am -will continue with IVF as noted  3. Essential Hypertension -hold diuretics -hold antihypertensives for now  4. Chronic Pain -pain control  5. Elevated WBC -possible relation to steroids -cultures ordered -will empirically start on zosyn and Vanc -will also get a CT of the abdomen  6. Normocytic Anemia -will check stool occult blood -will get iron studies  Code Status: Full Code (must indicate code status--if unknown or must be presumed, indicate so) DVT Prophylaxis:Heparin Family Communication: None (indicate person spoken with, if applicable, with phone number if by telephone) Disposition Plan: Home (indicate anticipated LOS)  Time spent: 55min  KHAN,SAADAT A [facility] Pager [phone]

## 2014-07-18 NOTE — ED Notes (Signed)
Per EMS- Pt comes from [facility], for n/v/d since lunch. Unable to establish BP.

## 2014-07-18 NOTE — Progress Notes (Signed)
Pt arrived from ED via stretcher. CHG bath given and MRSA swab sent to lab. Vital signs: BP 154/60, HR 105, and O2 saturation 98% on room air. Will continue to monitor.   Hart Rochester, RN, BSN

## 2014-07-19 ENCOUNTER — Observation Stay (HOSPITAL_COMMUNITY): Payer: 59

## 2014-07-19 ENCOUNTER — Encounter (HOSPITAL_COMMUNITY): Payer: Self-pay

## 2014-07-19 DIAGNOSIS — R6510 Systemic inflammatory response syndrome (SIRS) of non-infectious origin without acute organ dysfunction: Secondary | ICD-10-CM | POA: Diagnosis present

## 2014-07-19 DIAGNOSIS — F419 Anxiety disorder, unspecified: Secondary | ICD-10-CM | POA: Diagnosis present

## 2014-07-19 DIAGNOSIS — Z8701 Personal history of pneumonia (recurrent): Secondary | ICD-10-CM | POA: Diagnosis not present

## 2014-07-19 DIAGNOSIS — I10 Essential (primary) hypertension: Secondary | ICD-10-CM | POA: Diagnosis present

## 2014-07-19 DIAGNOSIS — J309 Allergic rhinitis, unspecified: Secondary | ICD-10-CM | POA: Diagnosis present

## 2014-07-19 DIAGNOSIS — K529 Noninfective gastroenteritis and colitis, unspecified: Secondary | ICD-10-CM

## 2014-07-19 DIAGNOSIS — Z8673 Personal history of transient ischemic attack (TIA), and cerebral infarction without residual deficits: Secondary | ICD-10-CM | POA: Diagnosis not present

## 2014-07-19 DIAGNOSIS — G8929 Other chronic pain: Secondary | ICD-10-CM | POA: Diagnosis not present

## 2014-07-19 DIAGNOSIS — G894 Chronic pain syndrome: Secondary | ICD-10-CM | POA: Diagnosis present

## 2014-07-19 DIAGNOSIS — Z885 Allergy status to narcotic agent status: Secondary | ICD-10-CM | POA: Diagnosis not present

## 2014-07-19 DIAGNOSIS — E872 Acidosis, unspecified: Secondary | ICD-10-CM | POA: Diagnosis present

## 2014-07-19 DIAGNOSIS — D649 Anemia, unspecified: Secondary | ICD-10-CM | POA: Diagnosis present

## 2014-07-19 DIAGNOSIS — E860 Dehydration: Secondary | ICD-10-CM | POA: Diagnosis present

## 2014-07-19 DIAGNOSIS — Z888 Allergy status to other drugs, medicaments and biological substances status: Secondary | ICD-10-CM | POA: Diagnosis not present

## 2014-07-19 DIAGNOSIS — Z7952 Long term (current) use of systemic steroids: Secondary | ICD-10-CM | POA: Diagnosis not present

## 2014-07-19 DIAGNOSIS — I959 Hypotension, unspecified: Secondary | ICD-10-CM | POA: Diagnosis present

## 2014-07-19 DIAGNOSIS — N179 Acute kidney failure, unspecified: Secondary | ICD-10-CM | POA: Diagnosis present

## 2014-07-19 DIAGNOSIS — E861 Hypovolemia: Secondary | ICD-10-CM | POA: Diagnosis present

## 2014-07-19 DIAGNOSIS — K589 Irritable bowel syndrome without diarrhea: Secondary | ICD-10-CM | POA: Diagnosis present

## 2014-07-19 DIAGNOSIS — Z96651 Presence of right artificial knee joint: Secondary | ICD-10-CM | POA: Diagnosis present

## 2014-07-19 DIAGNOSIS — Z79899 Other long term (current) drug therapy: Secondary | ICD-10-CM | POA: Diagnosis not present

## 2014-07-19 DIAGNOSIS — Z981 Arthrodesis status: Secondary | ICD-10-CM | POA: Diagnosis not present

## 2014-07-19 DIAGNOSIS — K219 Gastro-esophageal reflux disease without esophagitis: Secondary | ICD-10-CM | POA: Diagnosis present

## 2014-07-19 DIAGNOSIS — Z9049 Acquired absence of other specified parts of digestive tract: Secondary | ICD-10-CM | POA: Diagnosis present

## 2014-07-19 DIAGNOSIS — M069 Rheumatoid arthritis, unspecified: Secondary | ICD-10-CM | POA: Diagnosis present

## 2014-07-19 HISTORY — DX: Noninfective gastroenteritis and colitis, unspecified: K52.9

## 2014-07-19 LAB — COMPREHENSIVE METABOLIC PANEL
ALT: 33 U/L (ref 14–54)
AST: 48 U/L — AB (ref 15–41)
Albumin: 3.2 g/dL — ABNORMAL LOW (ref 3.5–5.0)
Alkaline Phosphatase: 91 U/L (ref 38–126)
Anion gap: 16 — ABNORMAL HIGH (ref 5–15)
BUN: 27 mg/dL — ABNORMAL HIGH (ref 6–20)
CALCIUM: 8.9 mg/dL (ref 8.9–10.3)
CO2: 19 mmol/L — AB (ref 22–32)
CREATININE: 1.62 mg/dL — AB (ref 0.44–1.00)
Chloride: 106 mmol/L (ref 101–111)
GFR calc Af Amer: 38 mL/min — ABNORMAL LOW (ref >60)
GFR, EST NON AFRICAN AMERICAN: 33 mL/min — AB (ref >60)
Glucose, Bld: 218 mg/dL — ABNORMAL HIGH (ref 70–99)
Potassium: 4.1 mmol/L (ref 3.5–5.1)
Sodium: 141 mmol/L (ref 135–145)
Total Bilirubin: 0.5 mg/dL (ref 0.3–1.2)
Total Protein: 6 g/dL — ABNORMAL LOW (ref 6.5–8.1)

## 2014-07-19 LAB — LACTIC ACID, PLASMA
Lactic Acid, Venous: 3.7 mmol/L (ref 0.5–2.0)
Lactic Acid, Venous: 3.2 mmol/L (ref 0.5–2.0)

## 2014-07-19 LAB — CBC
HEMATOCRIT: 32.1 % — AB (ref 36.0–46.0)
Hemoglobin: 10.4 g/dL — ABNORMAL LOW (ref 12.0–15.0)
MCH: 26.1 pg (ref 26.0–34.0)
MCHC: 32.4 g/dL (ref 30.0–36.0)
MCV: 80.7 fL (ref 78.0–100.0)
PLATELETS: 458 10*3/uL — AB (ref 150–400)
RBC: 3.98 MIL/uL (ref 3.87–5.11)
RDW: 14.8 % (ref 11.5–15.5)
WBC: 20.4 10*3/uL — ABNORMAL HIGH (ref 4.0–10.5)

## 2014-07-19 LAB — IRON AND TIBC
IRON: 13 ug/dL — AB (ref 28–170)
Saturation Ratios: 4 % — ABNORMAL LOW (ref 10.4–31.8)
TIBC: 291 ug/dL (ref 250–450)
UIBC: 278 ug/dL

## 2014-07-19 LAB — PROTIME-INR
INR: 1.11 (ref 0.00–1.49)
Prothrombin Time: 14.5 seconds (ref 11.6–15.2)

## 2014-07-19 LAB — URINE CULTURE
COLONY COUNT: NO GROWTH
Culture: NO GROWTH

## 2014-07-19 LAB — APTT: aPTT: 28 seconds (ref 24–37)

## 2014-07-19 LAB — GLUCOSE, CAPILLARY: GLUCOSE-CAPILLARY: 183 mg/dL — AB (ref 70–99)

## 2014-07-19 LAB — VITAMIN B12: Vitamin B-12: 273 pg/mL (ref 180–914)

## 2014-07-19 LAB — PROCALCITONIN: Procalcitonin: 0.28 ng/mL

## 2014-07-19 MED ORDER — HYDROMORPHONE HCL 1 MG/ML IJ SOLN
1.0000 mg | INTRAMUSCULAR | Status: DC | PRN
  Administered 2014-07-19 – 2014-07-20 (×5): 1 mg via INTRAVENOUS
  Administered 2014-07-20: 2 mg via INTRAVENOUS
  Administered 2014-07-20 – 2014-07-21 (×3): 1 mg via INTRAVENOUS
  Filled 2014-07-19 (×5): qty 1
  Filled 2014-07-19: qty 2
  Filled 2014-07-19 (×3): qty 1

## 2014-07-19 MED ORDER — HYDRALAZINE HCL 20 MG/ML IJ SOLN
10.0000 mg | Freq: Four times a day (QID) | INTRAMUSCULAR | Status: DC | PRN
Start: 2014-07-19 — End: 2014-07-21

## 2014-07-19 MED ORDER — PANTOPRAZOLE SODIUM 40 MG IV SOLR
40.0000 mg | Freq: Two times a day (BID) | INTRAVENOUS | Status: DC
  Administered 2014-07-19 – 2014-07-21 (×5): 40 mg via INTRAVENOUS
  Filled 2014-07-19 (×6): qty 40

## 2014-07-19 MED ORDER — ONDANSETRON HCL 4 MG/2ML IJ SOLN
4.0000 mg | INTRAMUSCULAR | Status: DC | PRN
  Administered 2014-07-19 – 2014-07-21 (×8): 4 mg via INTRAVENOUS
  Filled 2014-07-19 (×8): qty 2

## 2014-07-19 MED ORDER — PROMETHAZINE HCL 25 MG/ML IJ SOLN: 12.5000 mg | Freq: Four times a day (QID) | INTRAMUSCULAR | Status: DC | PRN

## 2014-07-19 MED ORDER — IOHEXOL 300 MG/ML  SOLN
25.0000 mL | INTRAMUSCULAR | Status: AC
  Administered 2014-07-19 (×2): 25 mL via ORAL

## 2014-07-19 MED ORDER — LORAZEPAM 2 MG/ML IJ SOLN
0.5000 mg | Freq: Once | INTRAMUSCULAR | Status: AC
  Administered 2014-07-19: 0.5 mg via INTRAVENOUS
  Filled 2014-07-19: qty 1

## 2014-07-19 MED ORDER — ONDANSETRON HCL 4 MG/2ML IJ SOLN
4.0000 mg | Freq: Four times a day (QID) | INTRAMUSCULAR | Status: DC | PRN
  Administered 2014-07-19: 4 mg via INTRAVENOUS
  Filled 2014-07-19: qty 2

## 2014-07-19 NOTE — Progress Notes (Signed)
[facility]                                                                              Patient Demographics  Angie Olson, is a 64 y.o. female, DOB - [birth year], JSE:[id number]  Admit date - 07/18/2014   Admitting Physician Allyne Gee, MD  Outpatient Primary MD for the patient is Dorian Heckle, MD  LOS -    Chief Complaint  Patient presents with  . Hypotension       Brief HPI   Patient is a 64 year old female with IBS, GERD, chronic pain syndrome, hypertension, rheumatoid arthritis, who recently had a lumbar fusion surgery L5-S1 on 4/20 presented with hypotension, nausea and vomiting. Patient reported that she was having pain from her recent back surgery and then felt constipated. Normally she has IBS and has diarrhea. While at her doctor's office, she felt weak and chills. Her BP was noticed to be low. Patient has a history of hypertension and was on diuretics prior to admission, having poor PO intake. She was sent to the ED and was noted to have a SBP of 88. She also complained of dizziness, lightheadedness, abdominal pain.  Of note, patient is on chronic prednisone for rheumatoid arthritis.   Assessment & Plan    Principal Problem: SIRS with Colitis: Possible mild ischemic or infectious colitis - CT of the abdomen and pelvis showed mild inflammatory stranding around the splenic flexure, minimal changes of colitis or ischemia. - Decreased diet to clear liquids, continue IV fluids, on IV antibiotics  Active Problems:   Hypotension with history of HTN (hypertension) - Patient received 1 dose of IV Solu-Medrol for possible relative adrenal insufficiency from chronic steroid use, continue to hold triamterene, HCTZ, hold lisinopril - Continue gentle hydration    Hypovolemia/ dehydration, lactic acidosis - Continue IV fluid hydration, hold diuretics    AKI (acute kidney injury) - Likely due to #1, also on lisinopril, triamterene, HCTZ, naproxen -  Continue gentle hydration, continue to hold the above medications    Chronic pain - Placed on IV Dilaudid, continue Percocet for refractory/breakthrough pain  Code Status: Full code  Family Communication: Discussed in detail with the patient, all imaging results, lab results explained to the patient and husband  Disposition Plan: Remain in stepdown today  Time Spent in minutes   25 minutes  Procedures  CT abdomen and pelvis  Consults   None  DVT Prophylaxis  heparin   Medications  Scheduled Meds: . ALPRAZolam  1 mg Oral QHS  . aspirin EC  81 mg Oral Daily  . atorvastatin  20 mg Oral Q0600  . fluticasone  2 spray Each Nare BID  . folic acid  1 mg Oral Daily  . heparin  5,000 Units Subcutaneous 3 times per day  . multivitamin with minerals  1 tablet Oral Daily  . pantoprazole (PROTONIX) IV  40 mg Intravenous Q12H  . piperacillin-tazobactam (ZOSYN)  IV  3.375 g Intravenous 3 times per day  . predniSONE  10 mg Oral Daily  . sodium chloride  3 mL Intravenous Q12H  . thiamine  100 mg Oral Daily  . vancomycin  1,250 mg Intravenous Q24H   Continuous Infusions: . sodium chloride 100 mL/hr at 07/19/14 0804   PRN Meds:.hydrALAZINE, HYDROmorphone (DILAUDID) injection, ondansetron (ZOFRAN) IV, oxyCODONE-acetaminophen, promethazine   Antibiotics   Anti-infectives    Start     Dose/Rate Route Frequency Ordered Stop   07/19/14 2200  vancomycin (VANCOCIN) 1,250 mg in sodium chloride 0.9 % 250 mL IVPB     1,250 mg 166.7 mL/hr over 90 Minutes Intravenous Every 24 hours 07/18/14 2034     07/18/14 2100  vancomycin (VANCOCIN) 1,500 mg in sodium chloride 0.9 % 500 mL IVPB     1,500 mg 250 mL/hr over 120 Minutes Intravenous  Once 07/18/14 2034 07/18/14 2310   07/18/14 2100  piperacillin-tazobactam (ZOSYN) IVPB 3.375 g     3.375 g 12.5 mL/hr over 240 Minutes Intravenous 3 times per day 07/18/14 2034          Subjective:   Angie Olson was seen and examined today.  Feeling  uncomfortable, having nausea and dry heaving, abdominal pain, no fevers or chills. BP better controlled. Denies any chest pain or shortness of breath, numbness or tingling. Per RN, patient had a large formed bowel movement last night, not diarrhea.    Objective:   Blood pressure 159/69, pulse 101, temperature 98.9 F (37.2 C), temperature source Oral, resp. rate 17, height 5\' 5"  (1.651 m), weight 90.8 kg (200 lb 2.8 oz), SpO2 100 %.  Wt Readings from Last 3 Encounters:  07/18/14 90.8 kg (200 lb 2.8 oz)  07/05/14 87.5 kg (192 lb 14.4 oz)  01/11/14 90.8 kg (200 lb 2.8 oz)     Intake/Output Summary (Last 24 hours) at 07/19/14 1057 Last data filed at 07/19/14 0900  Gross per 24 hour  Intake 2352.5 ml  Output    750 ml  Net 1602.5 ml    Exam  General: Alert and oriented x 3, NAD, uncomfortable  HEENT:  PERRLA, EOMI, Anicteic Sclera, mucous membranes moist.   Neck: Supple, no JVD, no masses  CVS: S1 S2 auscultated, no rubs, murmurs or gallops. Regular rate and rhythm.  Respiratory: Clear to auscultation bilaterally, no wheezing, rales or rhonchi  Abdomen: Soft, mild diffuse tenderness, nondistended, + bowel sounds  Ext: no cyanosis clubbing or edema  Neuro: AAOx3, Cr N's II- XII. Strength 5/5 upper and lower extremities bilaterally  Skin: No rashes  Psych: Normal affect and demeanor, alert and oriented x3    Data Review   Micro Results Recent Results (from the past 240 hour(s))  Culture, blood (routine x 2)     Status: None (Preliminary result)   Collection Time: 07/18/14  3:30 PM  Result Value Ref Range Status   Specimen Description BLOOD RIGHT ARM  Final   Special Requests BOTTLES DRAWN AEROBIC AND ANAEROBIC 10CC  Final   Culture   Final           BLOOD CULTURE RECEIVED NO GROWTH TO DATE CULTURE WILL BE HELD FOR 5 DAYS BEFORE ISSUING A FINAL NEGATIVE REPORT Performed at [facility]    Report Status PENDING  Incomplete  MRSA PCR Screening     Status:  None   Collection Time: 07/18/14  7:54 PM  Result Value Ref Range Status   MRSA by PCR NEGATIVE NEGATIVE Final    Comment:        The GeneXpert MRSA Assay (FDA approved for NASAL specimens only), is one component of a comprehensive MRSA colonization surveillance program. It is not intended to diagnose MRSA infection  nor to guide or monitor treatment for MRSA infections.     Radiology Reports Ct Abdomen Pelvis Wo Contrast  07/19/2014   CLINICAL DATA:  Generalized abdominal pain starting last night. Back surgery 2 weeks ago.  EXAM: CT ABDOMEN AND PELVIS WITHOUT CONTRAST  TECHNIQUE: Multidetector CT imaging of the abdomen and pelvis was performed following the standard protocol without IV contrast.  FINDINGS: There is posterior decompression with instrumented fusion at L5-S1. There is a small amount of fluid and soft tissue edema in the incision. There is no soft tissue gas.  There is mild inflammatory stranding around the splenic flexure without evidence of mass or diverticular disease. There is no extraluminal air. There is no abscess. Remainder of the bowel is unremarkable.  The abdominal aorta is normal in caliber with mild atherosclerotic calcification.  There are unremarkable unenhanced appearances of the liver, bile ducts, pancreas, spleen, adrenals and kidneys with the exception of a 3 mm lower pole left renal collecting system calculus. There is no hydronephrosis or ureteral dilatation.  There is mild atelectatic-appearing opacity in both lung bases  IMPRESSION: *Mild edema and fluid in the midline lower lumbar incision, within the spectrum of expected postoperative findings. *Mild inflammatory stranding around the splenic flexure. This could represent minimal changes of colitis or ischemia. This is not the expected appearance of diverticulitis. *Nonobstructing lower pole left renal calculus. *Mild basilar atelectatic-appearing opacities in both lungs   Electronically Signed   By: Andreas Newport M.D.   On: 07/19/2014 05:28   Ct Lumbar Spine Wo Contrast  06/20/2014   CLINICAL DATA:  Lumbar stenosis, low back pain since 20/11.  EXAM: CT LUMBAR SPINE WITHOUT CONTRAST  TECHNIQUE: Multidetector CT imaging of the lumbar spine was performed without intravenous contrast administration. Multiplanar CT image reconstructions were also generated.  COMPARISON:  MRI lumbar spine 02/23/2014, CT lumbar spine 01/11/2014  FINDINGS: The vertebral body heights are maintained. The alignment is anatomic. The paravertebral soft tissues are normal. There is no fracture or static listhesis. There is no spondylolysis. The visualized portion of the SI joints are unremarkable.  There is interbody fusion at L4-5 with endplate reactive changes and solid fusion of the posterior elements.  There is posterior lumbar interbody fusion at L5-S1 with bilateral uncal screws at each level. There is lucency around the right S1 pedicle screw consistent with loosening.  There is bilateral mild facet arthropathy at L2-3 and L3-4.  There is abdominal aortic atherosclerosis.  IMPRESSION: 1. Solid fusion at L4-5 without significant interval change. 2. Posterior lumbar interbody fusion at L5-S1 with endplate changes and right S1 pedicle screw loosening. 3. Postsurgical changes in the posterior paraspinal soft tissues.   Electronically Signed   By: Kathreen Devoid   On: 06/20/2014 14:22   Dg Lumbar Spine 1 View  07/05/2014   CLINICAL DATA:  L5-S1 posterior fusion  EXAM: DG C-ARM 61-120 MIN; LUMBAR SPINE - 1 VIEW  COMPARISON:  04/11/2014  FLUOROSCOPY TIME:  Radiation Exposure Index (as provided by the fluoroscopic device):  If the device does not provide the exposure index:  Fluoroscopy Time:  44 seconds  Number of Acquired Images:  0  FINDINGS: Two intraoperative spot images demonstrate pedicle screws in place at L5 and S1. Discectomy changes at L4-5 and L5-S1. No hardware or bony complicating feature. Normal alignment.  IMPRESSION: Posterior  fusion L5-S1.   Electronically Signed   By: Rolm Baptise M.D.   On: 07/05/2014 12:11   Dg Chest Portable 1 View  07/18/2014   CLINICAL DATA:  Nausea and vomiting  EXAM: PORTABLE CHEST - 1 VIEW  COMPARISON:  11/25/13  FINDINGS: The heart size and mediastinal contours are within normal limits. Both lungs are clear. The visualized skeletal structures are unremarkable.  IMPRESSION: No active disease.   Electronically Signed   By: Inez Catalina M.D.   On: 07/18/2014 15:45   Dg C-arm 61-120 Min  07/05/2014   CLINICAL DATA:  L5-S1 posterior fusion  EXAM: DG C-ARM 61-120 MIN; LUMBAR SPINE - 1 VIEW  COMPARISON:  04/11/2014  FLUOROSCOPY TIME:  Radiation Exposure Index (as provided by the fluoroscopic device):  If the device does not provide the exposure index:  Fluoroscopy Time:  44 seconds  Number of Acquired Images:  0  FINDINGS: Two intraoperative spot images demonstrate pedicle screws in place at L5 and S1. Discectomy changes at L4-5 and L5-S1. No hardware or bony complicating feature. Normal alignment.  IMPRESSION: Posterior fusion L5-S1.   Electronically Signed   By: Rolm Baptise M.D.   On: 07/05/2014 12:11    CBC  Recent Labs Lab 07/18/14 1530 07/19/14 0344  WBC 19.1* 20.4*  HGB 11.6* 10.4*  HCT 35.3* 32.1*  PLT 494* 458*  MCV 81.0 80.7  MCH 26.6 26.1  MCHC 32.9 32.4  RDW 14.7 14.8  LYMPHSABS 4.1*  --   MONOABS 0.9  --   EOSABS 0.2  --   BASOSABS 0.0  --     Chemistries   Recent Labs Lab 07/18/14 1530 07/19/14 0344  NA 138 141  K 3.6 4.1  CL 99* 106  CO2 22 19*  GLUCOSE 143* 218*  BUN 26* 27*  CREATININE 1.87* 1.62*  CALCIUM 9.6 8.9  AST 27 48*  ALT 19 33  ALKPHOS 95 91  BILITOT 0.7 0.5   ------------------------------------------------------------------------------------------------------------------ estimated creatinine clearance is 39.6 mL/min (by C-G formula based on Cr of  1.62). ------------------------------------------------------------------------------------------------------------------ No results for input(s): HGBA1C in the last 72 hours. ------------------------------------------------------------------------------------------------------------------ No results for input(s): CHOL, HDL, LDLCALC, TRIG, CHOLHDL, LDLDIRECT in the last 72 hours. ------------------------------------------------------------------------------------------------------------------ No results for input(s): TSH, T4TOTAL, T3FREE, THYROIDAB in the last 72 hours.  Invalid input(s): FREET3 ------------------------------------------------------------------------------------------------------------------  Recent Labs  07/19/14 0344  VITAMINB12 273  TIBC 291  IRON 13*    Coagulation profile  Recent Labs Lab 07/19/14 0852  INR 1.11    No results for input(s): DDIMER in the last 72 hours.  Cardiac Enzymes No results for input(s): CKMB, TROPONINI, MYOGLOBIN in the last 168 hours.  Invalid input(s): CK ------------------------------------------------------------------------------------------------------------------ Invalid input(s): POCBNP   Recent Labs  07/19/14 0814  GLUCAP 183*     RAI,RIPUDEEP M.D. [facility] 07/19/2014, 10:57 AM  Pager: [phone]   Between 7am to 7pm - call Pager - [phone]  After 7pm go to [email or web address] - password [email or web address]  Call night coverage person covering after 7pm

## 2014-07-19 NOTE — Progress Notes (Signed)
CRITICAL VALUE ALERT  Critical value received:  Lactic acid 3.7  Date of notification:  07/19/2014   Time of notification: 6301  Critical value read back:yes  Nurse who received alert: Donnella Bi, RN  MD notified (1st page):Dr. Rai Time of first page:  1132  MD notified (2nd page):  Time of second page:  Responding MD:    Time MD responded:

## 2014-07-19 NOTE — Progress Notes (Signed)
UR completed 

## 2014-07-20 LAB — BASIC METABOLIC PANEL
Anion gap: 7 (ref 5–15)
BUN: 16 mg/dL (ref 6–20)
CALCIUM: 8.8 mg/dL — AB (ref 8.9–10.3)
CO2: 25 mmol/L (ref 22–32)
CREATININE: 1.19 mg/dL — AB (ref 0.44–1.00)
Chloride: 107 mmol/L (ref 101–111)
GFR calc non Af Amer: 48 mL/min — ABNORMAL LOW (ref >60)
GFR, EST AFRICAN AMERICAN: 55 mL/min — AB (ref >60)
Glucose, Bld: 131 mg/dL — ABNORMAL HIGH (ref 70–99)
Potassium: 4.5 mmol/L (ref 3.5–5.1)
SODIUM: 139 mmol/L (ref 135–145)

## 2014-07-20 LAB — CBC
HEMATOCRIT: 27.6 % — AB (ref 36.0–46.0)
Hemoglobin: 9 g/dL — ABNORMAL LOW (ref 12.0–15.0)
MCH: 26.5 pg (ref 26.0–34.0)
MCHC: 32.6 g/dL (ref 30.0–36.0)
MCV: 81.2 fL (ref 78.0–100.0)
Platelets: 367 10*3/uL (ref 150–400)
RBC: 3.4 MIL/uL — ABNORMAL LOW (ref 3.87–5.11)
RDW: 14.6 % (ref 11.5–15.5)
WBC: 21 10*3/uL — ABNORMAL HIGH (ref 4.0–10.5)

## 2014-07-20 LAB — GLUCOSE, CAPILLARY
GLUCOSE-CAPILLARY: 107 mg/dL — AB (ref 70–99)
GLUCOSE-CAPILLARY: 122 mg/dL — AB (ref 70–99)

## 2014-07-20 LAB — FOLATE RBC
FOLATE, HEMOLYSATE: 414.5 ng/mL
FOLATE, RBC: 1291 ng/mL (ref >498)
Hematocrit: 32.1 % — ABNORMAL LOW (ref 34.0–46.6)

## 2014-07-20 MED ORDER — INSULIN ASPART 100 UNIT/ML ~~LOC~~ SOLN: 0.0000 [IU] | Freq: Every day | SUBCUTANEOUS | Status: DC

## 2014-07-20 MED ORDER — INSULIN ASPART 100 UNIT/ML ~~LOC~~ SOLN
0.0000 [IU] | Freq: Three times a day (TID) | SUBCUTANEOUS | Status: DC
  Administered 2014-07-20 – 2014-07-21 (×2): 1 [IU] via SUBCUTANEOUS

## 2014-07-20 MED ORDER — SUCRALFATE 1 GM/10ML PO SUSP
1.0000 g | Freq: Three times a day (TID) | ORAL | Status: DC
  Administered 2014-07-20 – 2014-07-21 (×4): 1 g via ORAL
  Filled 2014-07-20 (×6): qty 10

## 2014-07-20 MED ORDER — SUCRALFATE 1 GM/10ML PO SUSP: 1.0000 g | Freq: Three times a day (TID) | ORAL | Status: DC

## 2014-07-20 NOTE — Progress Notes (Signed)
[facility]                                                                              Patient Demographics  Angie Olson, is a 64 y.o. female, DOB - [birth year], CVE:[id number]  Admit date - 07/18/2014   Admitting Physician Allyne Gee, MD  Outpatient Primary MD for the patient is Dorian Heckle, MD  LOS - 1   Chief Complaint  Patient presents with  . Hypotension       Brief HPI   Patient is a 64 year old female with IBS, GERD, chronic pain syndrome, hypertension, rheumatoid arthritis, who recently had a lumbar fusion surgery L5-S1 on 4/20 presented with hypotension, nausea and vomiting. Patient reported that she was having pain from her recent back surgery and then felt constipated. Normally she has IBS and has diarrhea. While at her doctor's office, she felt weak and chills. Her BP was noticed to be low. Patient has a history of hypertension and was on diuretics prior to admission, having poor PO intake. She was sent to the ED and was noted to have a SBP of 88. She also complained of dizziness, lightheadedness, abdominal pain.  Of note, patient is on chronic prednisone for rheumatoid arthritis.   Assessment & Plan    Principal Problem: SIRS with Colitis: Possible mild ischemic or infectious colitis - improving -  CT of the abdomen and pelvis showed mild inflammatory stranding around the splenic flexure, minimal changes of colitis or ischemia. - continue IV fluids, on IV antibiotics - diet advanced to full liquids today, advance to solids tonight if tolerating  Active Problems:   Hypotension with history of HTN (hypertension) - Continue to hold HCTZ, lisinopril, triamterene, continue gentle hydration    Hypovolemia/ dehydration, lactic acidosis - Continue IV fluid hydration, hold diuretics    AKI (acute kidney injury): Resolved - Likely due to #1, also on lisinopril, triamterene, HCTZ, naproxen - Continue gentle hydration, continue to hold the above  medications    Chronic pain with recent history of lumbar fusion surgery - Placed on IV Dilaudid, continue Percocet for refractory/breakthrough pain  Code Status: Full code  Family Communication: Discussed in detail with the patient, all imaging results, lab results explained to the patient and husband at the bedside  Disposition Plan: Transfer to the floor today  Time Spent in minutes   25 minutes  Procedures  CT abdomen and pelvis  Consults   None  DVT Prophylaxis  heparin   Medications  Scheduled Meds: . ALPRAZolam  1 mg Oral QHS  . aspirin EC  81 mg Oral Daily  . atorvastatin  20 mg Oral Q0600  . fluticasone  2 spray Each Nare BID  . folic acid  1 mg Oral Daily  . heparin  5,000 Units Subcutaneous 3 times per day  . multivitamin with minerals  1 tablet Oral Daily  . pantoprazole (PROTONIX) IV  40 mg Intravenous Q12H  . piperacillin-tazobactam (ZOSYN)  IV  3.375 g Intravenous 3 times per day  . predniSONE  10 mg Oral Daily  . sodium chloride  3 mL Intravenous Q12H  . thiamine  100 mg  Oral Daily  . vancomycin  1,250 mg Intravenous Q24H   Continuous Infusions: . sodium chloride 50 mL/hr at 07/20/14 0730   PRN Meds:.hydrALAZINE, HYDROmorphone (DILAUDID) injection, ondansetron (ZOFRAN) IV, oxyCODONE-acetaminophen, promethazine   Antibiotics   Anti-infectives    Start     Dose/Rate Route Frequency Ordered Stop   07/19/14 2200  vancomycin (VANCOCIN) 1,250 mg in sodium chloride 0.9 % 250 mL IVPB     1,250 mg 166.7 mL/hr over 90 Minutes Intravenous Every 24 hours 07/18/14 2034     07/18/14 2100  vancomycin (VANCOCIN) 1,500 mg in sodium chloride 0.9 % 500 mL IVPB     1,500 mg 250 mL/hr over 120 Minutes Intravenous  Once 07/18/14 2034 07/18/14 2310   07/18/14 2100  piperacillin-tazobactam (ZOSYN) IVPB 3.375 g     3.375 g 12.5 mL/hr over 240 Minutes Intravenous 3 times per day 07/18/14 2034          Subjective:   Angie Olson was seen and examined today.  Feeling a whole lot better today, no nausea, abdominal pain, fevers or chills. Overnight was able to tolerate clear liquid diet.  Denies any chest pain or shortness of breath, numbness or tingling. No new weakness, numbness or tingling.   Objective:   Blood pressure 99/84, pulse 95, temperature 99 F (37.2 C), temperature source Oral, resp. rate 16, height 5\' 5"  (1.651 m), weight 90.8 kg (200 lb 2.8 oz), SpO2 99 %.  Wt Readings from Last 3 Encounters:  07/18/14 90.8 kg (200 lb 2.8 oz)  07/05/14 87.5 kg (192 lb 14.4 oz)  01/11/14 90.8 kg (200 lb 2.8 oz)     Intake/Output Summary (Last 24 hours) at 07/20/14 1257 Last data filed at 07/20/14 1004  Gross per 24 hour  Intake   1693 ml  Output   1500 ml  Net    193 ml    Exam  General: Alert and oriented x 3, NAD, uncomfortable  HEENT:  PERRLA, EOMI, Anicteic Sclera, mucous membranes moist.   Neck: Supple, no JVD, no masses  CVS: S1 S2 clear, no MRG  Respiratory: CTAB, no wheezing, rales or rhonchi  Abdomen: Soft, NT, ND, + bowel sounds  Ext: no cyanosis clubbing or edema  Neuro: AAOx3, Cr N's II- XII. Strength 5/5 upper and lower extremities bilaterally  Skin: No rashes  Psych: Normal affect and demeanor, alert and oriented x3    Data Review   Micro Results Recent Results (from the past 240 hour(s))  Culture, blood (routine x 2)     Status: None (Preliminary result)   Collection Time: 07/18/14  3:30 PM  Result Value Ref Range Status   Specimen Description BLOOD RIGHT ARM  Final   Special Requests BOTTLES DRAWN AEROBIC AND ANAEROBIC 10CC  Final   Culture   Final           BLOOD CULTURE RECEIVED NO GROWTH TO DATE CULTURE WILL BE HELD FOR 5 DAYS BEFORE ISSUING A FINAL NEGATIVE REPORT Performed at [facility]    Report Status PENDING  Incomplete  Urine culture     Status: None   Collection Time: 07/18/14  3:50 PM  Result Value Ref Range Status   Specimen Description URINE, RANDOM  Final   Special  Requests NONE  Final   Colony Count NO GROWTH Performed at [facility]   Final   Culture NO GROWTH Performed at [facility]   Final   Report Status 07/19/2014 FINAL  Final  Culture, blood (  routine x 2)     Status: None (Preliminary result)   Collection Time: 07/18/14  4:56 PM  Result Value Ref Range Status   Specimen Description BLOOD RIGHT HAND  Final   Special Requests BOTTLES DRAWN AEROBIC ONLY 4MLS  Final   Culture   Final           BLOOD CULTURE RECEIVED NO GROWTH TO DATE CULTURE WILL BE HELD FOR 5 DAYS BEFORE ISSUING A FINAL NEGATIVE REPORT Performed at [facility]    Report Status PENDING  Incomplete  MRSA PCR Screening     Status: None   Collection Time: 07/18/14  7:54 PM  Result Value Ref Range Status   MRSA by PCR NEGATIVE NEGATIVE Final    Comment:        The GeneXpert MRSA Assay (FDA approved for NASAL specimens only), is one component of a comprehensive MRSA colonization surveillance program. It is not intended to diagnose MRSA infection nor to guide or monitor treatment for MRSA infections.     Radiology Reports Ct Abdomen Pelvis Wo Contrast  07/19/2014   CLINICAL DATA:  Generalized abdominal pain starting last night. Back surgery 2 weeks ago.  EXAM: CT ABDOMEN AND PELVIS WITHOUT CONTRAST  TECHNIQUE: Multidetector CT imaging of the abdomen and pelvis was performed following the standard protocol without IV contrast.  FINDINGS: There is posterior decompression with instrumented fusion at L5-S1. There is a small amount of fluid and soft tissue edema in the incision. There is no soft tissue gas.  There is mild inflammatory stranding around the splenic flexure without evidence of mass or diverticular disease. There is no extraluminal air. There is no abscess. Remainder of the bowel is unremarkable.  The abdominal aorta is normal in caliber with mild atherosclerotic calcification.  There are unremarkable unenhanced appearances of the liver, bile  ducts, pancreas, spleen, adrenals and kidneys with the exception of a 3 mm lower pole left renal collecting system calculus. There is no hydronephrosis or ureteral dilatation.  There is mild atelectatic-appearing opacity in both lung bases  IMPRESSION: *Mild edema and fluid in the midline lower lumbar incision, within the spectrum of expected postoperative findings. *Mild inflammatory stranding around the splenic flexure. This could represent minimal changes of colitis or ischemia. This is not the expected appearance of diverticulitis. *Nonobstructing lower pole left renal calculus. *Mild basilar atelectatic-appearing opacities in both lungs   Electronically Signed   By: Andreas Newport M.D.   On: 07/19/2014 05:28   Ct Lumbar Spine Wo Contrast  06/20/2014   CLINICAL DATA:  Lumbar stenosis, low back pain since 20/11.  EXAM: CT LUMBAR SPINE WITHOUT CONTRAST  TECHNIQUE: Multidetector CT imaging of the lumbar spine was performed without intravenous contrast administration. Multiplanar CT image reconstructions were also generated.  COMPARISON:  MRI lumbar spine 02/23/2014, CT lumbar spine 01/11/2014  FINDINGS: The vertebral body heights are maintained. The alignment is anatomic. The paravertebral soft tissues are normal. There is no fracture or static listhesis. There is no spondylolysis. The visualized portion of the SI joints are unremarkable.  There is interbody fusion at L4-5 with endplate reactive changes and solid fusion of the posterior elements.  There is posterior lumbar interbody fusion at L5-S1 with bilateral uncal screws at each level. There is lucency around the right S1 pedicle screw consistent with loosening.  There is bilateral mild facet arthropathy at L2-3 and L3-4.  There is abdominal aortic atherosclerosis.  IMPRESSION: 1. Solid fusion at L4-5 without significant interval change. 2. Posterior  lumbar interbody fusion at L5-S1 with endplate changes and right S1 pedicle screw loosening. 3.  Postsurgical changes in the posterior paraspinal soft tissues.   Electronically Signed   By: Kathreen Devoid   On: 06/20/2014 14:22   Dg Lumbar Spine 1 View  07/05/2014   CLINICAL DATA:  L5-S1 posterior fusion  EXAM: DG C-ARM 61-120 MIN; LUMBAR SPINE - 1 VIEW  COMPARISON:  04/11/2014  FLUOROSCOPY TIME:  Radiation Exposure Index (as provided by the fluoroscopic device):  If the device does not provide the exposure index:  Fluoroscopy Time:  44 seconds  Number of Acquired Images:  0  FINDINGS: Two intraoperative spot images demonstrate pedicle screws in place at L5 and S1. Discectomy changes at L4-5 and L5-S1. No hardware or bony complicating feature. Normal alignment.  IMPRESSION: Posterior fusion L5-S1.   Electronically Signed   By: Rolm Baptise M.D.   On: 07/05/2014 12:11   Dg Chest Portable 1 View  07/18/2014   CLINICAL DATA:  Nausea and vomiting  EXAM: PORTABLE CHEST - 1 VIEW  COMPARISON:  11/25/13  FINDINGS: The heart size and mediastinal contours are within normal limits. Both lungs are clear. The visualized skeletal structures are unremarkable.  IMPRESSION: No active disease.   Electronically Signed   By: Inez Catalina M.D.   On: 07/18/2014 15:45   Dg C-arm 61-120 Min  07/05/2014   CLINICAL DATA:  L5-S1 posterior fusion  EXAM: DG C-ARM 61-120 MIN; LUMBAR SPINE - 1 VIEW  COMPARISON:  04/11/2014  FLUOROSCOPY TIME:  Radiation Exposure Index (as provided by the fluoroscopic device):  If the device does not provide the exposure index:  Fluoroscopy Time:  44 seconds  Number of Acquired Images:  0  FINDINGS: Two intraoperative spot images demonstrate pedicle screws in place at L5 and S1. Discectomy changes at L4-5 and L5-S1. No hardware or bony complicating feature. Normal alignment.  IMPRESSION: Posterior fusion L5-S1.   Electronically Signed   By: Rolm Baptise M.D.   On: 07/05/2014 12:11    CBC  Recent Labs Lab 07/18/14 1530 07/19/14 0344 07/20/14 0300  WBC 19.1* 20.4* 21.0*  HGB 11.6* 10.4* 9.0*    HCT 35.3* 32.1* 27.6*  PLT 494* 458* 367  MCV 81.0 80.7 81.2  MCH 26.6 26.1 26.5  MCHC 32.9 32.4 32.6  RDW 14.7 14.8 14.6  LYMPHSABS 4.1*  --   --   MONOABS 0.9  --   --   EOSABS 0.2  --   --   BASOSABS 0.0  --   --     Chemistries   Recent Labs Lab 07/18/14 1530 07/19/14 0344 07/20/14 0300  NA 138 141 139  K 3.6 4.1 4.5  CL 99* 106 107  CO2 22 19* 25  GLUCOSE 143* 218* 131*  BUN 26* 27* 16  CREATININE 1.87* 1.62* 1.19*  CALCIUM 9.6 8.9 8.8*  AST 27 48*  --   ALT 19 33  --   ALKPHOS 95 91  --   BILITOT 0.7 0.5  --    ------------------------------------------------------------------------------------------------------------------ estimated creatinine clearance is 53.9 mL/min (by C-G formula based on Cr of 1.19). ------------------------------------------------------------------------------------------------------------------ No results for input(s): HGBA1C in the last 72 hours. ------------------------------------------------------------------------------------------------------------------ No results for input(s): CHOL, HDL, LDLCALC, TRIG, CHOLHDL, LDLDIRECT in the last 72 hours. ------------------------------------------------------------------------------------------------------------------ No results for input(s): TSH, T4TOTAL, T3FREE, THYROIDAB in the last 72 hours.  Invalid input(s): FREET3 ------------------------------------------------------------------------------------------------------------------  Recent Labs  07/19/14 0344  VITAMINB12 273  TIBC 291  IRON 13*  Coagulation profile  Recent Labs Lab 07/19/14 0852  INR 1.11    No results for input(s): DDIMER in the last 72 hours.  Cardiac Enzymes No results for input(s): CKMB, TROPONINI, MYOGLOBIN in the last 168 hours.  Invalid input(s): CK ------------------------------------------------------------------------------------------------------------------ Invalid input(s):  POCBNP   Recent Labs  07/19/14 0814 07/20/14 0721  GLUCAP 183* 107*     RAI,RIPUDEEP M.D. [facility] 07/20/2014, 12:57 PM  Pager: [phone]   Between 7am to 7pm - call Pager - [phone]  After 7pm go to [email or web address] - password [email or web address]  Call night coverage person covering after 7pm

## 2014-07-21 ENCOUNTER — Inpatient Hospital Stay (HOSPITAL_COMMUNITY): Payer: 59

## 2014-07-21 DIAGNOSIS — E872 Acidosis: Secondary | ICD-10-CM

## 2014-07-21 DIAGNOSIS — A419 Sepsis, unspecified organism: Secondary | ICD-10-CM

## 2014-07-21 LAB — BASIC METABOLIC PANEL
Anion gap: 8 (ref 5–15)
BUN: 13 mg/dL (ref 6–20)
CHLORIDE: 104 mmol/L (ref 101–111)
CO2: 27 mmol/L (ref 22–32)
Calcium: 8.5 mg/dL — ABNORMAL LOW (ref 8.9–10.3)
Creatinine, Ser: 1.39 mg/dL — ABNORMAL HIGH (ref 0.44–1.00)
GFR calc Af Amer: 46 mL/min — ABNORMAL LOW (ref >60)
GFR calc non Af Amer: 39 mL/min — ABNORMAL LOW (ref >60)
Glucose, Bld: 146 mg/dL — ABNORMAL HIGH (ref 70–99)
POTASSIUM: 4.3 mmol/L (ref 3.5–5.1)
Sodium: 139 mmol/L (ref 135–145)

## 2014-07-21 LAB — CBC
HCT: 25.5 % — ABNORMAL LOW (ref 36.0–46.0)
Hemoglobin: 8.2 g/dL — ABNORMAL LOW (ref 12.0–15.0)
MCH: 26.4 pg (ref 26.0–34.0)
MCHC: 32.2 g/dL (ref 30.0–36.0)
MCV: 82 fL (ref 78.0–100.0)
PLATELETS: 344 10*3/uL (ref 150–400)
RBC: 3.11 MIL/uL — ABNORMAL LOW (ref 3.87–5.11)
RDW: 14.8 % (ref 11.5–15.5)
WBC: 11 10*3/uL — ABNORMAL HIGH (ref 4.0–10.5)

## 2014-07-21 LAB — GLUCOSE, CAPILLARY
GLUCOSE-CAPILLARY: 106 mg/dL — AB (ref 70–99)
GLUCOSE-CAPILLARY: 84 mg/dL (ref 70–99)
Glucose-Capillary: 123 mg/dL — ABNORMAL HIGH (ref 70–99)

## 2014-07-21 LAB — HEMOGLOBIN A1C
Hgb A1c MFr Bld: 7.1 % — ABNORMAL HIGH (ref 4.8–5.6)
MEAN PLASMA GLUCOSE: 157 mg/dL

## 2014-07-21 MED ORDER — CIPROFLOXACIN HCL 500 MG PO TABS: 500.0000 mg | ORAL_TABLET | Freq: Two times a day (BID) | ORAL | Status: DC

## 2014-07-21 MED ORDER — DICYCLOMINE HCL 10 MG PO CAPS
10.0000 mg | ORAL_CAPSULE | Freq: Three times a day (TID) | ORAL | Status: DC
  Administered 2014-07-21: 10 mg via ORAL
  Filled 2014-07-21: qty 1

## 2014-07-21 MED ORDER — DICYCLOMINE HCL 10 MG PO CAPS: 10.0000 mg | ORAL_CAPSULE | Freq: Three times a day (TID) | ORAL | Status: DC | PRN

## 2014-07-21 MED ORDER — PROMETHAZINE HCL 25 MG PO TABS
25.0000 mg | ORAL_TABLET | ORAL | Status: DC | PRN
Start: 2014-07-21 — End: 2016-09-23

## 2014-07-21 MED ORDER — OXYCODONE-ACETAMINOPHEN 5-325 MG PO TABS: 1.0000 | ORAL_TABLET | Freq: Four times a day (QID) | ORAL | Status: DC | PRN

## 2014-07-21 MED ORDER — METRONIDAZOLE 500 MG PO TABS: 500.0000 mg | ORAL_TABLET | Freq: Three times a day (TID) | ORAL | Status: DC

## 2014-07-21 NOTE — Discharge Summary (Signed)
Physician Discharge Summary   Patient ID: Angie Olson MRN: [id number] DOB/AGE: May 04, 1950 64 y.o.  Admit date: 07/18/2014 Discharge date: 07/21/2014  Primary Care Physician:  Dorian Heckle, MD  Discharge Diagnoses:   . Colitis . Lactic acidosis . SIRS (systemic inflammatory response syndrome) . Hypovolemia dehydration . Hypotension arterial . AKI (acute kidney injury) . HTN (hypertension) . Chronic pain    Consults: NONE   Recommendations for Outpatient Follow-up:  Patient was recommended to HOLD her antihypertensives (lisinopril, Triamterene-HCTZ) till follow-up appt   Please check BMET at the appt    DIET: Heart healthy diet    Allergies:   Allergies  Allergen Reactions  . Methotrexate Derivatives Other (See Comments)    Causes recurrent infections  . Pseudoephedrine Other (See Comments)    Makes jittery   . Reglan [Metoclopramide] Other (See Comments)    insomnia  . Vicodin [Hydrocodone-Acetaminophen] Other (See Comments)    "Makes me feel weird"     Discharge Medications:   Medication List    STOP taking these medications        lisinopril 10 MG tablet  Commonly known as:  PRINIVIL,ZESTRIL     triamterene-hydrochlorothiazide 37.5-25 MG per tablet  Commonly known as:  MAXZIDE-25      TAKE these medications        ALPRAZolam 1 MG tablet  Commonly known as:  XANAX  Take 1 mg by mouth at bedtime.     atorvastatin 20 MG tablet  Commonly known as:  LIPITOR  Take 20 mg by mouth daily at 6 (six) AM.     cetirizine 10 MG tablet  Commonly known as:  ZYRTEC  Take 10 mg by mouth daily.     ciprofloxacin 500 MG tablet  Commonly known as:  CIPRO  Take 1 tablet (500 mg total) by mouth 2 (two) times daily. X 7DAYS     cyclobenzaprine 10 MG tablet  Commonly known as:  FLEXERIL  Take 1 tablet (10 mg total) by mouth 3 (three) times daily as needed for muscle spasms.     dicyclomine 10 MG capsule  Commonly known as:  BENTYL  Take 1 capsule  (10 mg total) by mouth 3 (three) times daily with meals as needed for spasms.     fluticasone 50 MCG/ACT nasal spray  Commonly known as:  FLONASE  Place 2 sprays into both nostrils 2 (two) times daily.     metroNIDAZOLE 500 MG tablet  Commonly known as:  FLAGYL  Take 1 tablet (500 mg total) by mouth 3 (three) times daily. X 7DAYS     naproxen sodium 220 MG tablet  Commonly known as:  ANAPROX  Take 220 mg by mouth 2 (two) times daily as needed (pain). ALEVE     NEXIUM PO  Take 1 tablet by mouth daily.     nortriptyline 25 MG capsule  Commonly known as:  PAMELOR  Take 25 mg by mouth at bedtime.     oxyCODONE-acetaminophen 5-325 MG per tablet  Commonly known as:  PERCOCET/ROXICET  Take 1 tablet by mouth every 6 (six) hours as needed for moderate pain or severe pain.     predniSONE 5 MG tablet  Commonly known as:  DELTASONE  Take 10 mg by mouth daily. Continuous for rheumatoid arthritis     promethazine 25 MG tablet  Commonly known as:  PHENERGAN  Take 1 tablet (25 mg total) by mouth every 4 (four) hours as needed for nausea or vomiting.  sucralfate 1 G tablet  Commonly known as:  CARAFATE  Take 1 g by mouth 2 (two) times daily as needed (acid reflux).         Brief H and P: For complete details please refer to admission H and P, but in briefPatient is a 64 year old female with IBS, GERD, chronic pain syndrome, hypertension, rheumatoid arthritis, who recently had a lumbar fusion surgery L5-S1 on 4/20 presented with hypotension, nausea and vomiting. Patient reported that she was having pain from her recent back surgery and then felt constipated. Normally she has IBS and has diarrhea. While at her doctor's office, she felt weak and chills. Her BP was noticed to be low. Patient has a history of hypertension and was on diuretics prior to admission, having poor PO intake. She was sent to the ED and was noted to have a SBP of 88. She also complained of dizziness, lightheadedness,  abdominal pain.  Of note, patient is on chronic prednisone for rheumatoid arthritis.  Hospital Course:   SIRS with Colitis: Possible mild ischemic or infectious colitis,  Patient was admitted to stepdown unit and placed on aggressive IV fluid hydration, IV antibiotics.  CT of the abdomen and pelvis showed mild inflammatory stranding around the splenic flexure, minimal changes of colitis or ischemia. All the antihypertensives were held. Diet was slowly advanced. Repeat abd xray was unremarkable. Patient is now tolerating solid diet. She was also placed on dicyclomine as needed for abd cramps, patient also reports prior history of IBS. She is being discharged on ciprofloxacin and flagyl for a week.  - continue IV fluids, on IV antibiotics - diet advanced to full liquids today, advance to solids tonight if tolerating   Hypotension with history of HTN (hypertension) - Continue to hold HCTZ, lisinopril, triamterene for now. BP is stable but soft. I encouraged hydration and good PO intake.    Hypovolemia/ dehydration, lactic acidosis - improved with Iv fluids   AKI (acute kidney injury): Resolved - Likely due to #1, also due to lisinopril, triamterene, HCTZ, naproxen. Patient recommended to continue to hold these medications. Please check BMET at the follow-up appt.    Chronic pain with recent history of lumbar fusion surgery - Patient did well with Physical therapy. Continue  Percocet for refractory/breakthrough pain  Day of Discharge BP 120/92 mmHg  Pulse 93  Temp(Src) 98 F (36.7 C) (Oral)  Resp 17  Ht 5\' 5"  (1.651 m)  Wt 90.8 kg (200 lb 2.8 oz)  BMI 33.31 kg/m2  SpO2 97%  Physical Exam: General: Alert and awake oriented x3 not in any acute distress. HEENT: anicteric sclera, pupils reactive to light and accommodation CVS: S1-S2 clear no murmur rubs or gallops Chest: clear to auscultation bilaterally, no wheezing rales or rhonchi Abdomen: soft nontender, nondistended, normal  bowel sounds Extremities: no cyanosis, clubbing or edema noted bilaterally Neuro: Cranial nerves II-XII intact, no focal neurological deficits   The results of significant diagnostics from this hospitalization (including imaging, microbiology, ancillary and laboratory) are listed below for reference.    LAB RESULTS: Basic Metabolic Panel:  Recent Labs Lab 07/20/14 0300 07/21/14 0408  NA 139 139  K 4.5 4.3  CL 107 104  CO2 25 27  GLUCOSE 131* 146*  BUN 16 13  CREATININE 1.19* 1.39*  CALCIUM 8.8* 8.5*   Liver Function Tests:  Recent Labs Lab 07/18/14 1530 07/19/14 0344  AST 27 48*  ALT 19 33  ALKPHOS 95 91  BILITOT 0.7 0.5  PROT 6.6 6.0*  ALBUMIN 3.7 3.2*   No results for input(s): LIPASE, AMYLASE in the last 168 hours. No results for input(s): AMMONIA in the last 168 hours. CBC:  Recent Labs Lab 07/18/14 1530  07/20/14 0300 07/21/14 0408  WBC 19.1*  < > 21.0* 11.0*  NEUTROABS 13.8*  --   --   --   HGB 11.6*  < > 9.0* 8.2*  HCT 35.3*  < > 27.6* 25.5*  MCV 81.0  < > 81.2 82.0  PLT 494*  < > 367 344  < > = values in this interval not displayed. Cardiac Enzymes: No results for input(s): CKTOTAL, CKMB, CKMBINDEX, TROPONINI in the last 168 hours. BNP: Invalid input(s): POCBNP CBG:  Recent Labs Lab 07/21/14 0817 07/21/14 1142  GLUCAP 84 123*    Significant Diagnostic Studies:  Ct Abdomen Pelvis Wo Contrast  07/19/2014   CLINICAL DATA:  Generalized abdominal pain starting last night. Back surgery 2 weeks ago.  EXAM: CT ABDOMEN AND PELVIS WITHOUT CONTRAST  TECHNIQUE: Multidetector CT imaging of the abdomen and pelvis was performed following the standard protocol without IV contrast.  FINDINGS: There is posterior decompression with instrumented fusion at L5-S1. There is a small amount of fluid and soft tissue edema in the incision. There is no soft tissue gas.  There is mild inflammatory stranding around the splenic flexure without evidence of mass or  diverticular disease. There is no extraluminal air. There is no abscess. Remainder of the bowel is unremarkable.  The abdominal aorta is normal in caliber with mild atherosclerotic calcification.  There are unremarkable unenhanced appearances of the liver, bile ducts, pancreas, spleen, adrenals and kidneys with the exception of a 3 mm lower pole left renal collecting system calculus. There is no hydronephrosis or ureteral dilatation.  There is mild atelectatic-appearing opacity in both lung bases  IMPRESSION: *Mild edema and fluid in the midline lower lumbar incision, within the spectrum of expected postoperative findings. *Mild inflammatory stranding around the splenic flexure. This could represent minimal changes of colitis or ischemia. This is not the expected appearance of diverticulitis. *Nonobstructing lower pole left renal calculus. *Mild basilar atelectatic-appearing opacities in both lungs   Electronically Signed   By: Andreas Newport M.D.   On: 07/19/2014 05:28   Dg Chest Portable 1 View  07/18/2014   CLINICAL DATA:  Nausea and vomiting  EXAM: PORTABLE CHEST - 1 VIEW  COMPARISON:  11/25/13  FINDINGS: The heart size and mediastinal contours are within normal limits. Both lungs are clear. The visualized skeletal structures are unremarkable.  IMPRESSION: No active disease.   Electronically Signed   By: Inez Catalina M.D.   On: 07/18/2014 15:45    2D ECHO:   Disposition and Follow-up:     Discharge Instructions    Diet - low sodium heart healthy    Complete by:  As directed      Discharge instructions    Complete by:  As directed   Please HOLD your BP medications at least until you follow with your Primary physician. Have your labs checked for metabolic profile, renal function at the appointment.     Increase activity slowly    Complete by:  As directed             DISPOSITION: home  DISCHARGE FOLLOW-UP Follow-up Information    Follow up with Dorian Heckle, MD. Schedule an  appointment as soon as possible for a visit in 10 days.   Specialty:  Internal Medicine   Why:  for hospital  follow-up, obtain labs BMET, please stop your BP meds until your follow-up appt   Contact information:   [address] [place] [address] [phone]        Time spent on Discharge: 35 mins  Signed:   Charley Lafrance M.D. [facility] 07/21/2014, 3:57 PM Pager: [phone]

## 2014-07-21 NOTE — Progress Notes (Signed)
Discussed discharge summary with patient. Reviewed all medications with patient. Patient received Rx. Patient ready for discharge. 

## 2014-07-21 NOTE — Progress Notes (Signed)
ANTIBIOTIC CONSULT NOTE - FOLLOW UP  Pharmacy Consult for zosyn Indication: r/o sepsis  Allergies  Allergen Reactions  . Methotrexate Derivatives Other (See Comments)    Causes recurrent infections  . Pseudoephedrine Other (See Comments)    Makes jittery   . Reglan [Metoclopramide] Other (See Comments)    insomnia  . Vicodin [Hydrocodone-Acetaminophen] Other (See Comments)    "Makes me feel weird"    Patient Measurements: Height: 5\' 5"  (165.1 cm) Weight: 200 lb 2.8 oz (90.8 kg) IBW/kg (Calculated) : 57  Vital Signs: Temp: 97.8 F (36.6 C) (05/06 0500) Temp Source: Oral (05/06 0500) BP: 131/71 mmHg (05/06 0500) Pulse Rate: 76 (05/06 0500) Intake/Output from previous day: 05/05 0701 - 05/06 0700 In: 1992.2 [P.O.:720; I.V.:1209.7; IV Piggyback:62.5] Out: 200 [Urine:200] Intake/Output from this shift:    Labs:  Recent Labs  07/19/14 0344 07/20/14 0300 07/21/14 0408  WBC 20.4* 21.0* 11.0*  HGB 10.4* 9.0* 8.2*  PLT 458* 367 344  CREATININE 1.62* 1.19* 1.39*   Estimated Creatinine Clearance: 46.1 mL/min (by C-G formula based on Cr of 1.39). No results for input(s): VANCOTROUGH, VANCOPEAK, VANCORANDOM, GENTTROUGH, GENTPEAK, GENTRANDOM, TOBRATROUGH, TOBRAPEAK, TOBRARND, AMIKACINPEAK, AMIKACINTROU, AMIKACIN in the last 72 hours.   Microbiology: Recent Results (from the past 720 hour(s))  Surgical pcr screen     Status: None   Collection Time: 06/30/14  2:56 PM  Result Value Ref Range Status   MRSA, PCR NEGATIVE NEGATIVE Final   Staphylococcus aureus NEGATIVE NEGATIVE Final    Comment:        The Xpert SA Assay (FDA approved for NASAL specimens in patients over 73 years of age), is one component of a comprehensive surveillance program.  Test performance has been validated by [facility] for patients greater than or equal to 38 year old. It is not intended to diagnose infection nor to guide or monitor treatment.   Anaerobic culture     Status: None   Collection Time: 07/05/14  9:05 AM  Result Value Ref Range Status   Specimen Description WOUND BACK  Final   Special Requests LUMBAR  Final   Gram Stain   Final    RARE WBC PRESENT, PREDOMINANTLY MONONUCLEAR NO SQUAMOUS EPITHELIAL CELLS SEEN NO ORGANISMS SEEN Performed at [facility] Performed at [facility]    Culture   Final    NO ANAEROBES ISOLATED Performed at [facility]    Report Status 07/10/2014 FINAL  Final  Gram stain     Status: None   Collection Time: 07/05/14  9:05 AM  Result Value Ref Range Status   Specimen Description WOUND BACK  Final   Special Requests LUMBAR  Final   Gram Stain   Final    RARE WBC PRESENT, PREDOMINANTLY MONONUCLEAR NO ORGANISMS SEEN    Report Status 07/05/2014 FINAL  Final  Wound culture     Status: None   Collection Time: 07/05/14  9:05 AM  Result Value Ref Range Status   Specimen Description WOUND BACK  Final   Special Requests LUMBAR  Final   Gram Stain   Final    RARE WBC PRESENT, PREDOMINANTLY MONONUCLEAR NO SQUAMOUS EPITHELIAL CELLS SEEN NO ORGANISMS SEEN Performed at [facility] Performed at [facility]    Culture   Final    NO GROWTH 2 DAYS Performed at [facility]    Report Status 07/07/2014 FINAL  Final  Culture, blood (routine x 2)     Status: None (Preliminary result)  Collection Time: 07/18/14  3:30 PM  Result Value Ref Range Status   Specimen Description BLOOD RIGHT ARM  Final   Special Requests BOTTLES DRAWN AEROBIC AND ANAEROBIC 10CC  Final   Culture   Final           BLOOD CULTURE RECEIVED NO GROWTH TO DATE CULTURE WILL BE HELD FOR 5 DAYS BEFORE ISSUING A FINAL NEGATIVE REPORT Performed at [facility]    Report Status PENDING  Incomplete  Urine culture     Status: None   Collection Time: 07/18/14  3:50 PM  Result Value Ref Range Status   Specimen Description URINE, RANDOM  Final   Special Requests NONE  Final   Colony Count NO GROWTH Performed  at [facility]   Final   Culture NO GROWTH Performed at [facility]   Final   Report Status 07/19/2014 FINAL  Final  Culture, blood (routine x 2)     Status: None (Preliminary result)   Collection Time: 07/18/14  4:56 PM  Result Value Ref Range Status   Specimen Description BLOOD RIGHT HAND  Final   Special Requests BOTTLES DRAWN AEROBIC ONLY 4MLS  Final   Culture   Final           BLOOD CULTURE RECEIVED NO GROWTH TO DATE CULTURE WILL BE HELD FOR 5 DAYS BEFORE ISSUING A FINAL NEGATIVE REPORT Performed at [facility]    Report Status PENDING  Incomplete  MRSA PCR Screening     Status: None   Collection Time: 07/18/14  7:54 PM  Result Value Ref Range Status   MRSA by PCR NEGATIVE NEGATIVE Final    Comment:        The GeneXpert MRSA Assay (FDA approved for NASAL specimens only), is one component of a comprehensive MRSA colonization surveillance program. It is not intended to diagnose MRSA infection nor to guide or monitor treatment for MRSA infections.      Assessment: 64 yo female with SIRS with colitis and on zosyn for possible infectious colitis. WBC= 11 (trend down), tmax=99, SCr= 1.39 and CrCl ~ 45.   Vanc 5/3 >> 5/5 Zosyn 5/3 >>  5/3 blood x 2- ngtd 5/3 urine- neg  Plan:  -No zosyn dose changes needed -Will follow plans for length of therapy -Will follow renal function, cultures and clinical progress  Hildred Laser, Pharm D 07/21/2014 1:52 PM

## 2014-07-21 NOTE — Evaluation (Signed)
Physical Therapy Evaluation and Discharge Patient Details Name: Angie Olson MRN: [id number] DOB: [birth year] Today's Date: 07/21/2014   History of Present Illness  64 y.o. female who recently had a lumbar fusion surgery L5-S1 on 4/20 presented with hypotension, nausea and vomiting. Admitted for SIRS with Colitis: possible mild ischemic or infectious colitis.  Clinical Impression  Patient evaluated by Physical Therapy with no further acute PT needs identified. Ambulates safely with a rolling walker and does not require any physical assist. Demonstrates understanding of back precautions and safety with mobility at all times. Reports she is essentially at  her baseline with mobility, and her only complaint is the abdominal discomfort. Encouraged to change position frequently and get OOB as able with staff and family. All education has been completed and the patient has no further questions. PT is signing off. Thank you for this referral.     Follow Up Recommendations No PT follow up    Equipment Recommendations  None recommended by PT    Recommendations for Other Services       Precautions / Restrictions Precautions Precautions: Back Precaution Comments: reviewed precautions; pt able to state 3/3 precautions Required Braces or Orthoses: Spinal Brace Spinal Brace: Lumbar corset;Applied in sitting position Restrictions Weight Bearing Restrictions: No      Mobility  Bed Mobility Overal bed mobility: Modified Independent             General bed mobility comments: safely performs log roll.  Transfers Overall transfer level: Modified independent                  Ambulation/Gait Ambulation/Gait assistance: Supervision Ambulation Distance (Feet): 300 Feet Assistive device: Rolling walker (2 wheeled) Gait Pattern/deviations: Decreased stride length;Decreased step length - right;Trunk flexed     General Gait Details: VC for upright posture and walker placement for  proximity. Small stride, but no loss of balance noted.  Stairs Stairs:  (declines to practice)          Engineer, building services Rankin (Stroke Patients Only)       Balance Overall balance assessment: Needs assistance Sitting-balance support: No upper extremity supported;Feet supported Sitting balance-Leahy Scale: Good     Standing balance support: No upper extremity supported Standing balance-Leahy Scale: Fair                               Pertinent Vitals/Pain Pain Assessment: Faces Faces Pain Scale: Hurts little more Pain Location: abdomen> back Pain Intervention(s): Monitored during session;Repositioned    Home Living Family/patient expects to be discharged to:: Private residence Living Arrangements: Spouse/significant other Available Help at Discharge: Family;Available 24 hours/day Type of Home: House Home Access: Stairs to enter Entrance Stairs-Rails: Left Entrance Stairs-Number of Steps: 3 Home Layout: Multi-level;Able to live on main level with bedroom/bathroom Home Equipment: Gilford Rile - 2 wheels;Bedside commode;Cane - single point;Adaptive equipment;Shower seat      Prior Function Level of Independence: Independent with assistive device(s)         Comments: RW for mobility. bath/dress herself     Hand Dominance   Dominant Hand: Right    Extremity/Trunk Assessment   Upper Extremity Assessment: Defer to OT evaluation           Lower Extremity Assessment: Generalized weakness         Communication   Communication: No difficulties  Cognition Arousal/Alertness: Awake/alert Behavior During Therapy: WFL for tasks assessed/performed Overall Cognitive Status: Within Functional  Limits for tasks assessed                      General Comments      Exercises        Assessment/Plan    PT Assessment Patent does not need any further PT services  PT Diagnosis Acute pain;Abnormality of gait   PT Problem List     PT Treatment Interventions     PT Goals (Current goals can be found in the Care Plan section) Acute Rehab PT Goals Patient Stated Goal: not stated PT Goal Formulation: All assessment and education complete, DC therapy    Frequency     Barriers to discharge        Co-evaluation               End of Session Equipment Utilized During Treatment: Gait belt;Back brace Activity Tolerance: Patient tolerated treatment well;No increased pain Patient left: in bed;with call bell/phone within reach Nurse Communication: Mobility status         Time: 4081-4481 PT Time Calculation (min) (ACUTE ONLY): 20 min   Charges:   PT Evaluation $Initial PT Evaluation Tier I: 1 Procedure     PT G CodesEllouise Newer 07/21/2014, 11:49 AM Elayne Snare, [place]

## 2014-07-24 ENCOUNTER — Encounter: Payer: Self-pay | Admitting: Internal Medicine

## 2014-07-24 LAB — CULTURE, BLOOD (ROUTINE X 2): CULTURE: NO GROWTH

## 2014-07-25 LAB — CULTURE, BLOOD (ROUTINE X 2): Culture: NO GROWTH

## 2014-10-30 ENCOUNTER — Other Ambulatory Visit: Payer: Self-pay | Admitting: Neurosurgery

## 2014-10-30 DIAGNOSIS — M25552 Pain in left hip: Secondary | ICD-10-CM

## 2014-10-30 DIAGNOSIS — M5137 Other intervertebral disc degeneration, lumbosacral region: Secondary | ICD-10-CM

## 2014-11-08 ENCOUNTER — Other Ambulatory Visit: Payer: Self-pay | Admitting: Internal Medicine

## 2014-11-08 DIAGNOSIS — R921 Mammographic calcification found on diagnostic imaging of breast: Secondary | ICD-10-CM

## 2014-11-10 ENCOUNTER — Ambulatory Visit
Admission: RE | Admit: 2014-11-10 | Discharge: 2014-11-10 | Disposition: A | Discharge status: Home / Self Care | Payer: 59 | Source: Ambulatory Visit | Attending: Neurosurgery | Admitting: Neurosurgery

## 2014-11-10 DIAGNOSIS — M5137 Other intervertebral disc degeneration, lumbosacral region: Secondary | ICD-10-CM

## 2014-11-10 DIAGNOSIS — M25552 Pain in left hip: Secondary | ICD-10-CM

## 2014-12-04 ENCOUNTER — Encounter: Payer: Self-pay | Admitting: Internal Medicine

## 2015-01-05 ENCOUNTER — Telehealth: Payer: Self-pay | Admitting: Cardiology

## 2015-01-05 NOTE — Telephone Encounter (Signed)
New Message  Pt stated that Dr Edmonia Lynch had faxed a surgical clearance earlier this month - for surgery in Feb/2017- and calling to follow up/ Please call back and discuss.

## 2015-01-05 NOTE — Telephone Encounter (Signed)
Informed patient that clearance was received 2 days ago. Informed patient Dr. Radford Pax will review her chart and I will call her with clearance recommendations. Patient grateful for call back.

## 2015-01-24 ENCOUNTER — Telehealth: Payer: Self-pay

## 2015-01-24 NOTE — Telephone Encounter (Signed)
Per Dr. Radford Pax, patient needs to have OV with an APP to be cleared for surgery.  Left message to call back.

## 2015-01-25 ENCOUNTER — Ambulatory Visit
Admission: RE | Admit: 2015-01-25 | Discharge: 2015-01-25 | Disposition: A | Discharge status: Home / Self Care | Payer: 59 | Source: Ambulatory Visit | Attending: Internal Medicine | Admitting: Internal Medicine

## 2015-01-25 DIAGNOSIS — R921 Mammographic calcification found on diagnostic imaging of breast: Secondary | ICD-10-CM

## 2015-01-25 NOTE — Telephone Encounter (Signed)
Confirmed with patient she has OV with Lyda Jester on 01/29/15. Patient grateful for call.

## 2015-01-29 ENCOUNTER — Encounter: Payer: Self-pay | Admitting: Cardiology

## 2015-01-29 ENCOUNTER — Ambulatory Visit (INDEPENDENT_AMBULATORY_CARE_PROVIDER_SITE_OTHER): Payer: 59 | Admitting: Cardiology

## 2015-01-29 VITALS — BP 120/82 | HR 108 | Ht 65.0 in | Wt 187.0 lb

## 2015-01-29 DIAGNOSIS — I10 Essential (primary) hypertension: Secondary | ICD-10-CM | POA: Diagnosis not present

## 2015-01-29 DIAGNOSIS — R0609 Other forms of dyspnea: Secondary | ICD-10-CM

## 2015-01-29 NOTE — Progress Notes (Signed)
01/29/2015 Angie Olson   [birth year]  [id number]  Primary Physician Velna Hatchet, MD Primary Cardiologist: Dr. Radford Pax   Reason for Visit/CC: Surgical Cardiac Clearance  HPI:  The patient is a 64 y/o female, who presents to clinic today for surgical clearance for upcomming orthopedic surgery for left THR for hip OA.   In April 2015, she was referred to Dr. Radford Pax for pre-operative clearance prior to undergoing right TKR. She underwent a nuclear stress test 06/2013 that was negative for ischemia. LVF was normal. Her other PMH is significant for HTN, which is well controlled with medications.   Today in clinic she denies any h/o CP or dyspnea. No syncope/ near syncope, palpitations, orthopnea, PND or LEE. Her EKG shows mild sinus tach (baseline mild tachycardia) but no ischemia and is unchanged from previous EKGs. BP is stable.    Current Outpatient Prescriptions  Medication Sig Dispense Refill  . ALPRAZolam (XANAX) 1 MG tablet Take 1 mg by mouth at bedtime.     Marland Kitchen amLODipine (NORVASC) 5 MG tablet Take 1 tablet by mouth daily.    Marland Kitchen atorvastatin (LIPITOR) 20 MG tablet Take 20 mg by mouth daily at 6 (six) AM.     . cetirizine (ZYRTEC) 10 MG tablet Take 10 mg by mouth daily.    . cyclobenzaprine (FLEXERIL) 10 MG tablet Take 1 tablet (10 mg total) by mouth 3 (three) times daily as needed for muscle spasms. 60 tablet 1  . dicyclomine (BENTYL) 10 MG capsule Take 1 capsule (10 mg total) by mouth 3 (three) times daily with meals as needed for spasms. 90 capsule 3  . esomeprazole (NEXIUM) 20 MG capsule Take 20 mg by mouth daily at 12 noon.    . nortriptyline (PAMELOR) 25 MG capsule Take 25 mg by mouth at bedtime.   2  . oxyCODONE-acetaminophen (PERCOCET) 10-325 MG tablet Take 1 tablet by mouth every 4 (four) hours as needed for pain (hip pain).    . predniSONE (DELTASONE) 5 MG tablet Take 10 mg by mouth daily. Continuous for rheumatoid arthritis    . promethazine (PHENERGAN) 25 MG tablet  Take 1 tablet (25 mg total) by mouth every 4 (four) hours as needed for nausea or vomiting. 30 tablet 2  . sucralfate (CARAFATE) 1 G tablet Take 1 g by mouth 2 (two) times daily as needed (acid reflux).      No current facility-administered medications for this visit.    Allergies  Allergen Reactions  . Methotrexate Derivatives Other (See Comments)    Causes recurrent infections  . Pseudoephedrine Other (See Comments)    Makes jittery   . Reglan [Metoclopramide] Other (See Comments)    insomnia  . Vicodin [Hydrocodone-Acetaminophen] Other (See Comments)    "Makes me feel weird"    Social History   Social History  . Marital Status: Married    Spouse Name: N/A  . Number of Children: N/A  . Years of Education: N/A   Occupational History  . Not on file.   Social History Main Topics  . Smoking status: Never Smoker   . Smokeless tobacco: Never Used  . Alcohol Use: Yes     Comment: RARE   . Drug Use: No  . Sexual Activity: Yes   Other Topics Concern  . Not on file   Social History Narrative     Review of Systems: General: negative for chills, fever, night sweats or weight changes.  Cardiovascular: negative for chest pain, dyspnea on exertion, edema, orthopnea, palpitations,  paroxysmal nocturnal dyspnea or shortness of breath Dermatological: negative for rash Respiratory: negative for cough or wheezing Urologic: negative for hematuria Abdominal: negative for nausea, vomiting, diarrhea, bright red blood per rectum, melena, or hematemesis Neurologic: negative for visual changes, syncope, or dizziness All other systems reviewed and are otherwise negative except as noted above.    Blood pressure 120/82, pulse 108, height 5\' 5"  (1.651 m), weight 187 lb (84.823 kg).  General appearance: alert, cooperative and no distress Neck: no carotid bruit and no JVD Lungs: clear to auscultation bilaterally Heart: regular rate and rhythm, S1, S2 normal, no murmur, click, rub or  gallop Extremities: no LEE Pulses: 2+ and symmetric Skin: warm and dry' Neurologic: Grossly normal  EKG sinus tachycardia. 108 bpm (baseline mild tachycardia). No ischemia. Unchanged from prior.   ASSESSMENT AND PLAN:   1. Surgical Clearance: Patient had a low risk NST 06/2013 with normal LVF. She denies any anginal symptoms, including no CP or dyspnea. Her EKG shows mild sinus tach but no ischemia. Given no EKG abnormalities, no symptoms of ischemia and normal stress test in 2015, there is no indication for pre-operative ischemic testing. She has been cleared for surgery. A clearance letter will be sent to MD.   2. Left Hip OA: elective THR scheduled.   3. HTN: well controlled on current regimen.   PLAN  Continue f/u as needed.   Lyda Jester PA-C 01/29/2015 2:59 PM

## 2015-01-29 NOTE — Patient Instructions (Signed)
Medication Instructions:  Your physician recommends that you continue on your current medications as directed. Please refer to the Current Medication list given to you today.   Labwork: None ordered  Testing/Procedures: None ordered  Follow-Up: Your physician recommends that you schedule a follow-up appointment with Dr. Radford Pax as needed.   Any Other Special Instructions Will Be Listed Below (If Applicable).  You have been cleared for Hips Surgery.     If you need a refill on your cardiac medications before your next appointment, please call your pharmacy.

## 2015-02-15 HISTORY — PX: OTHER SURGICAL HISTORY: SHX169

## 2015-03-29 ENCOUNTER — Ambulatory Visit: Payer: Self-pay | Admitting: Orthopedic Surgery

## 2015-03-29 NOTE — H&P (Signed)
Angie Olson DOB: [birth year] Married / Language: English / Race: White Female Date of Admission: 04/25/2015 CC: Left Hip Pain History of Present Illness The patient is a 65 year old female who comes in for a preoperative History and Physical. The patient is scheduled for a left total hip arthroplasty (anterior) to be performed by Dr. Dione Plover. Aluisio, MD at [facility] on 04-25-2015. The patient was seen in referral from Dr. Saintclair Halsted. The patient reports left hip problems including pain symptoms that have been present for 6 month(s). The symptoms began without any known injury. Symptoms reported include hip pain The patient reports symptoms radiating to the: left groin. The patient describes the hip problem as aching. Onset of symptoms was gradual.The patient feels as if their symptoms are does feel they are worsening. Symptoms are exacerbated by flexing hip and sitting. Current treatment includes opioid analgesics (Oxycodone). Prior to being seen the patient was previously evaluated by a colleague. Previous workup for this problem has included hip MRI. She states that the left hip has been bothering her for six months to a year now. She has had a lot of surgery in the past, here with three major operations including two spine surgeries. She states that she has had a significant amount of pain in her back as well as in the left groin and thigh area. She has even had some discomfort in her right knee, which we replaced over a year ago. The hip pain is the thing that is bothering her the most. She is having to put over her weight on her right leg due to the pain in the left hip. It is in her groin, radiating to her thigh. It hurts during the day as well as at night. She is not having any significant weakness at this time. It has been progressive in nature and she is ready to proceed with surgery. They have been treated conservatively in the past for the above stated problem and despite conservative  measures, they continue to have progressive pain and severe functional limitations and dysfunction. They have failed non-operative management including home exercise, medications, and injections. It is felt that they would benefit from undergoing total joint replacement. Risks and benefits of the procedure have been discussed with the patient and they elect to proceed with surgery. There are no active contraindications to surgery such as ongoing infection or rapidly progressive neurological disease.  Problem List/Past Medical Primary osteoarthritis of left hip (M16.12)  Shoulder impingement, left (M75.42)  Hypercholesterolemia  Irritable bowel syndrome  Rheumatoid Arthritis  Cerebrovascular Accident  Mini Stroke Gastroesophageal Reflux Disease  High blood pressure  Skin Cancer  Melanoma Carotid Arterial Disease  Shingles  Allergies Dust  Mold Spores  No Known Drug Allergies   Family History Hypertension  mother, father, sister and brother mother and father Congestive Heart Failure  First Degree Relatives. father Osteoarthritis  mother Chronic Obstructive Lung Disease  mother Cancer  brother and grandfather mothers side Diabetes Mellitus  father grandmother fathers side Depression  sister Heart Disease  First Degree Relatives. mother, father, sister and brother  Social History Current work status  retired Previously in rehab  no Alcohol use  social only occasionally per week Drug/Alcohol Rehab (Currently)  no Drug/Alcohol Rehab (Previously)  no Children  2 Illicit drug use  no Living situation  live with spouse Marital status  married Number of flights of stairs before winded  2-3 Pain Contract  no Tobacco / smoke exposure  no Tobacco  use  Never smoker. never smoker [place] with husband following the Left TH-AA on 04/25/2015.  Medication History  Leflunomide (20MG  Tablet, Oral) Active. OxyCODONE HCl (10MG  Tablet,  Oral) Active. Cyclobenzaprine HCl (10MG  Tablet, Oral as needed) Active. Nortriptyline HCl (25MG  Capsule, Oral) Active. ALPRAZolam (1MG  Tablet, Oral) Active. (1/2 qhs) Lisinopril (40MG  Tablet, Oral) Active. Atorvastatin Calcium (20MG  Tablet, Oral) Active. AmLODIPine Besylate (5MG  Tablet, Oral) Active. PredniSONE (5MG  Tablet, Oral) Active. NexIUM (40MG  Capsule DR, Oral) Active. ZyrTEC Allergy (10MG  Capsule, Oral) Active. Promethazine HCl (25MG  Tablet, Oral as needed) Active.   Past Surgical History Spinal Fusion  lower back three times Tubal Ligation  Gallbladder Surgery  laporoscopic Ankle Surgery  left Other Surgery  Right hand trigger finger twice and left hand thumb fused Colon Polyp Removal - Colonoscopy  Arthroscopy of Shoulder  left Total Knee Replacement - Right   Review of Systems General Not Present- Chills, Fatigue, Fever, Memory Loss, Night Sweats, Weight Gain and Weight Loss. Skin Not Present- Eczema, Hives, Itching, Lesions and Rash. HEENT Not Present- Dentures, Double Vision, Headache, Hearing Loss, Tinnitus and Visual Loss. Respiratory Not Present- Allergies, Chronic Cough, Coughing up blood, Shortness of breath at rest and Shortness of breath with exertion. Cardiovascular Not Present- Chest Pain, Difficulty Breathing Lying Down, Murmur, Palpitations, Racing/skipping heartbeats and Swelling. Gastrointestinal Not Present- Abdominal Pain, Bloody Stool, Constipation, Diarrhea, Difficulty Swallowing, Heartburn, Jaundice, Loss of appetitie, Nausea and Vomiting. Female Genitourinary Not Present- Blood in Urine, Discharge, Flank Pain, Incontinence, Painful Urination, Urgency, Urinary frequency, Urinary Retention, Urinating at Night and Weak urinary stream. Musculoskeletal Present- Joint Pain. Not Present- Back Pain, Joint Swelling, Morning Stiffness, Muscle Pain, Muscle Weakness and Spasms. Neurological Not Present- Blackout spells, Difficulty with balance,  Dizziness, Paralysis, Tremor and Weakness. Psychiatric Not Present- Insomnia.  Vitals Weight: 185 lb Height: 65in Weight was reported by patient. Height was reported by patient. Body Surface Area: 1.91 m Body Mass Index: 30.79 kg/m  BP: 148/90 (Sitting, Left Arm, Standard)   Physical Exam General Mental Status -Alert, cooperative and good historian. General Appearance-pleasant, Not in acute distress. Orientation-Oriented X3. Build & Nutrition-Well nourished and Well developed.  Head and Neck Head-normocephalic, atraumatic . Face Global Assessment - Note: recent liner incision on right side from nose to right side of mouth (melanoma excision). Neck Global Assessment - supple, no bruit auscultated on the right, no bruit auscultated on the left.  Eye Vision-Wears corrective lenses(readers only). Pupil - Bilateral-Regular and Round. Motion - Bilateral-EOMI.  Chest and Lung Exam Auscultation Breath sounds - clear at anterior chest wall and clear at posterior chest wall. Adventitious sounds - No Adventitious sounds.  Cardiovascular Auscultation Rhythm - Regular rate and rhythm. Heart Sounds - S1 WNL and S2 WNL. Murmurs & Other Heart Sounds - Auscultation of the heart reveals - No Murmurs.  Abdomen Palpation/Percussion Tenderness - Abdomen is non-tender to palpation. Rigidity (guarding) - Abdomen is soft. Auscultation Auscultation of the abdomen reveals - Bowel sounds normal.  Female Genitourinary Note: Not done, not pertinent to present illness   Musculoskeletal Note: She is alert and oriented, in no apparent distress. Her left hip can be flexed to about 100, about 10 internal rotation, 20 external rotation and 20 abduction. Right hip has normal range of motion. Her right knee shows no swelling. Range about 0 to 125. There is no instability noted about the knee and no tenderness noted.  RADIOGRAPHS Her radiographs AP pelvis and lateral of the  left hip show moderate to advanced arthritic change  in the hip. She is just about bone on bone. Her right knee AP and lateral show a prosthesis in excellent position with no periprosthetic abnormalities.  Assessment & Plan  Primary osteoarthritis of left hip (M16.12)  Note:Surgical Plans: Left Total Hip Replacement - Anterior Approach  Disposition: Home with husband  PCP: Dr. Velna Hatchet - Patient has been seen and given a verbal approval to proceed with surgery. Cards: Dr. Radford Pax - Patient has been seen preoperatively and felt to be stable from a cardiac standpoint for surgery.  Topical TXA - History of recent Melanoma, Stroke, and Carotid Disease  Anesthesia Issues: None excpet some nausea at times.  Signed electronically by Joelene Millin, III PA-C

## 2015-03-29 NOTE — H&P (Signed)
Angie Olson DOB: 1950/04/01 Married / Language: English / Race: White Female Date of Admission:  04/25/2015 CC:  Left Hip Pain History of Present Illness The patient is a 65 year old female who comes in for a preoperative History and Physical. The patient is scheduled for a left total hip arthroplasty (anterior) to be performed by Dr. Dione Plover. Aluisio, MD at [facility] on 04-25-2015. The patient was seen in referral from Dr. Saintclair Halsted. The patient reports left hip problems including pain symptoms that have been present for 6 month(s). The symptoms began without any known injury. Symptoms reported include hip pain The patient reports symptoms radiating to the: left groin. The patient describes the hip problem as aching. Onset of symptoms was gradual.The patient feels as if their symptoms are does feel they are worsening. Symptoms are exacerbated by flexing hip and sitting. Current treatment includes opioid analgesics (Oxycodone). Prior to being seen the patient was previously evaluated by a colleague. Previous workup for this problem has included hip MRI. She states that the left hip has been bothering her for six months to a year now. She has had a lot of surgery in the past, here with three major operations including two spine surgeries. She states that she has had a significant amount of pain in her back as well as in the left groin and thigh area. She has even had some discomfort in her right knee, which we replaced over a year ago. The hip pain is the thing that is bothering her the most. She is having to put over her weight on her right leg due to the pain in the left hip. It is in her groin, radiating to her thigh. It hurts during the day as well as at night. She is not having any significant weakness at this time. It has been progressive in nature and she is ready to proceed with surgery. They have been treated conservatively in the past for the above stated problem and despite conservative  measures, they continue to have progressive pain and severe functional limitations and dysfunction. They have failed non-operative management including home exercise, medications, and injections. It is felt that they would benefit from undergoing total joint replacement. Risks and benefits of the procedure have been discussed with the patient and they elect to proceed with surgery. There are no active contraindications to surgery such as ongoing infection or rapidly progressive neurological disease.  Problem List/Past Medical Primary osteoarthritis of left hip (M16.12)  Shoulder impingement, left (M75.42)  Hypercholesterolemia  Irritable bowel syndrome  Rheumatoid Arthritis  Cerebrovascular Accident  Mini Stroke Gastroesophageal Reflux Disease  High blood pressure  Skin Cancer  Melanoma Carotid Arterial Disease  Shingles  Allergies Dust  Mold Spores  No Known Drug Allergies   Family History Hypertension  mother, father, sister and brother mother and father Congestive Heart Failure  First Degree Relatives. father Osteoarthritis  mother Chronic Obstructive Lung Disease  mother Cancer  brother and grandfather mothers side Diabetes Mellitus  father grandmother fathers side Depression  sister Heart Disease  First Degree Relatives. mother, father, sister and brother  Social History Current work status  retired Previously in rehab  no Alcohol use  social only occasionally per week Drug/Alcohol Rehab (Currently)  no Drug/Alcohol Rehab (Previously)  no Children  2 Illicit drug use  no Living situation  live with spouse Marital status  married Number of flights of stairs before winded  2-3 Pain Contract  no Tobacco / smoke exposure  no Tobacco use  Never smoker. never smoker [place] with husband following the Left TH-AA on 04/25/2015.  Medication History  Leflunomide (20MG  Tablet, Oral) Active. OxyCODONE HCl (10MG  Tablet, Oral)  Active. Cyclobenzaprine HCl (10MG  Tablet, Oral as needed) Active. Nortriptyline HCl (25MG  Capsule, Oral) Active. ALPRAZolam (1MG  Tablet, Oral) Active. (1/2 qhs) Lisinopril (40MG  Tablet, Oral) Active. Atorvastatin Calcium (20MG  Tablet, Oral) Active. AmLODIPine Besylate (5MG  Tablet, Oral) Active. PredniSONE (5MG  Tablet, Oral) Active. NexIUM (40MG  Capsule DR, Oral) Active. ZyrTEC Allergy (10MG  Capsule, Oral) Active. Promethazine HCl (25MG  Tablet, Oral as needed) Active.   Past Surgical History Spinal Fusion  lower back three times Tubal Ligation  Gallbladder Surgery  laporoscopic Ankle Surgery  left Other Surgery  Right hand trigger finger twice and left hand thumb fused Colon Polyp Removal - Colonoscopy  Arthroscopy of Shoulder  left Total Knee Replacement - Right   Review of Systems General Not Present- Chills, Fatigue, Fever, Memory Loss, Night Sweats, Weight Gain and Weight Loss. Skin Not Present- Eczema, Hives, Itching, Lesions and Rash. HEENT Not Present- Dentures, Double Vision, Headache, Hearing Loss, Tinnitus and Visual Loss. Respiratory Not Present- Allergies, Chronic Cough, Coughing up blood, Shortness of breath at rest and Shortness of breath with exertion. Cardiovascular Not Present- Chest Pain, Difficulty Breathing Lying Down, Murmur, Palpitations, Racing/skipping heartbeats and Swelling. Gastrointestinal Not Present- Abdominal Pain, Bloody Stool, Constipation, Diarrhea, Difficulty Swallowing, Heartburn, Jaundice, Loss of appetitie, Nausea and Vomiting. Female Genitourinary Not Present- Blood in Urine, Discharge, Flank Pain, Incontinence, Painful Urination, Urgency, Urinary frequency, Urinary Retention, Urinating at Night and Weak urinary stream. Musculoskeletal Present- Joint Pain. Not Present- Back Pain, Joint Swelling, Morning Stiffness, Muscle Pain, Muscle Weakness and Spasms. Neurological Not Present- Blackout spells, Difficulty with balance,  Dizziness, Paralysis, Tremor and Weakness. Psychiatric Not Present- Insomnia.  Vitals Weight: 185 lb Height: 65in Weight was reported by patient. Height was reported by patient. Body Surface Area: 1.91 m Body Mass Index: 30.79 kg/m  BP: 148/90 (Sitting, Left Arm, Standard)   Physical Exam General Mental Status -Alert, cooperative and good historian. General Appearance-pleasant, Not in acute distress. Orientation-Oriented X3. Build & Nutrition-Well nourished and Well developed.  Head and Neck Head-normocephalic, atraumatic . Face Global Assessment - Note: recent liner incision on right side from nose to right side of mouth (melanoma excision). Neck Global Assessment - supple, no bruit auscultated on the right, no bruit auscultated on the left.  Eye Vision-Wears corrective lenses(readers only). Pupil - Bilateral-Regular and Round. Motion - Bilateral-EOMI.  Chest and Lung Exam Auscultation Breath sounds - clear at anterior chest wall and clear at posterior chest wall. Adventitious sounds - No Adventitious sounds.  Cardiovascular Auscultation Rhythm - Regular rate and rhythm. Heart Sounds - S1 WNL and S2 WNL. Murmurs & Other Heart Sounds - Auscultation of the heart reveals - No Murmurs.  Abdomen Palpation/Percussion Tenderness - Abdomen is non-tender to palpation. Rigidity (guarding) - Abdomen is soft. Auscultation Auscultation of the abdomen reveals - Bowel sounds normal.  Female Genitourinary Note: Not done, not pertinent to present illness   Musculoskeletal Note: She is alert and oriented, in no apparent distress. Her left hip can be flexed to about 100, about 10 internal rotation, 20 external rotation and 20 abduction. Right hip has normal range of motion. Her right knee shows no swelling. Range about 0 to 125. There is no instability noted about the knee and no tenderness noted.  RADIOGRAPHS Her radiographs AP pelvis and lateral of the  left hip show moderate to advanced  arthritic change in the hip. She is just about bone on bone. Her right knee AP and lateral show a prosthesis in excellent position with no periprosthetic abnormalities.  Assessment & Plan  Primary osteoarthritis of left hip (M16.12)  Note:Surgical Plans: Left Total Hip Replacement - Anterior Approach  Disposition: Home with husband  PCP: Dr. Velna Hatchet - Patient has been seen and given a verbal approval to proceed with surgery. Cards: Dr. Radford Pax - Patient has been seen preoperatively and felt to be stable from a cardiac standpoint for surgery.  Topical TXA - History of recent Melanoma, Stroke, and Carotid Disease  Anesthesia Issues: None excpet some nausea at times.  Signed electronically by Joelene Millin, III PA-C

## 2015-04-12 ENCOUNTER — Ambulatory Visit: Payer: Self-pay | Admitting: Orthopedic Surgery

## 2015-04-12 NOTE — Progress Notes (Signed)
Preoperative surgical orders have been place into the Epic hospital system for Angie Olson on 04/12/2015, 10:18 AM  by Mickel Crow for surgery on 04-25-2015.  Preop Total Hip - Anterior Approach orders including IV Tylenol, and IV Decadron as long as there are no contraindications to the above medications. [name], PA-C

## 2015-04-17 NOTE — Patient Instructions (Addendum)
Angie Olson  04/17/2015   Your procedure is scheduled on: Wednesday 04/25/2015  Report to [facility]  Entrance take [facility]  elevators to 3rd floor to  [place] at  1140  AM.  Call this number if you have problems the morning of surgery [phone]   Remember: ONLY 1 PERSON MAY GO WITH YOU TO SHORT STAY TO GET  READY MORNING OF [place].   Do not eat food  :After Midnight. MAY HAVE CLEAR LIQUIDS FROM MIDNIGHT UP UNTIL 0740 AM THEN NOTHING UNTIL AFTER SURGERY!     Take these medicines the morning of surgery with A SIP OF WATER: Amlodipine, Esomeprazole (Nexium), Prednisone                                 You may not have any metal on your body including hair pins and              piercings  Do not wear jewelry, make-up, lotions, powders or perfumes, deodorant             Do not wear nail polish.  Do not shave  48 hours prior to surgery.              Men may shave face and neck.   Do not bring valuables to the hospital. [place].  Contacts, dentures or bridgework may not be worn into surgery.  Leave suitcase in the car. After surgery it may be brought to your room.     Patients discharged the day of surgery will not be allowed to drive home.  Name and phone number of your driver:  Special Instructions: N/A              Please read over the following fact sheets you were given: _____________________________________________________________________             [facility] - Preparing for Surgery Before surgery, you can play an important role.  Because skin is not sterile, your skin needs to be as free of germs as possible.  You can reduce the number of germs on your skin by washing with CHG (chlorahexidine gluconate) soap before surgery.  CHG is an antiseptic cleaner which kills germs and bonds with the skin to continue killing germs even after washing. Please DO NOT use if you have an  allergy to CHG or antibacterial soaps.  If your skin becomes reddened/irritated stop using the CHG and inform your nurse when you arrive at Short Stay. Do not shave (including legs and underarms) for at least 48 hours prior to the first CHG shower.  You may shave your face/neck. Please follow these instructions carefully:  1.  Shower with CHG Soap the night before surgery and the  morning of Surgery.  2.  If you choose to wash your hair, wash your hair first as usual with your  normal  shampoo.  3.  After you shampoo, rinse your hair and body thoroughly to remove the  shampoo.                           4.  Use CHG as you would any other liquid soap.  You  can apply chg directly  to the skin and wash                       Gently with a scrungie or clean washcloth.  5.  Apply the CHG Soap to your body ONLY FROM THE NECK DOWN.   Do not use on face/ open                           Wound or open sores. Avoid contact with eyes, ears mouth and genitals (private parts).                       Wash face,  Genitals (private parts) with your normal soap.             6.  Wash thoroughly, paying special attention to the area where your surgery  will be performed.  7.  Thoroughly rinse your body with warm water from the neck down.  8.  DO NOT shower/wash with your normal soap after using and rinsing off  the CHG Soap.                9.  Pat yourself dry with a clean towel.            10.  Wear clean pajamas.            11.  Place clean sheets on your bed the night of your first shower and do not  sleep with pets. Day of Surgery : Do not apply any lotions/deodorants the morning of surgery.  Please wear clean clothes to the hospital/surgery center.  FAILURE TO FOLLOW THESE INSTRUCTIONS MAY RESULT IN THE CANCELLATION OF YOUR SURGERY PATIENT SIGNATURE_________________________________  NURSE  SIGNATURE__________________________________  ________________________________________________________________________   Adam Phenix  An incentive spirometer is a tool that can help keep your lungs clear and active. This tool measures how well you are filling your lungs with each breath. Taking long deep breaths may help reverse or decrease the chance of developing breathing (pulmonary) problems (especially infection) following:  A long period of time when you are unable to move or be active. BEFORE THE PROCEDURE   If the spirometer includes an indicator to show your best effort, your nurse or respiratory therapist will set it to a desired goal.  If possible, sit up straight or lean slightly forward. Try not to slouch.  Hold the incentive spirometer in an upright position. INSTRUCTIONS FOR USE   Sit on the edge of your bed if possible, or sit up as far as you can in bed or on a chair.  Hold the incentive spirometer in an upright position.  Breathe out normally.  Place the mouthpiece in your mouth and seal your lips tightly around it.  Breathe in slowly and as deeply as possible, raising the piston or the ball toward the top of the column.  Hold your breath for 3-5 seconds or for as long as possible. Allow the piston or ball to fall to the bottom of the column.  Remove the mouthpiece from your mouth and breathe out normally.  Rest for a few seconds and repeat Steps 1 through 7 at least 10 times every 1-2 hours when you are awake. Take your time and take a few normal breaths between deep breaths.  The spirometer may include an indicator to show your best effort. Use the indicator as a goal to work toward during  each repetition.  After each set of 10 deep breaths, practice coughing to be sure your lungs are clear. If you have an incision (the cut made at the time of surgery), support your incision when coughing by placing a pillow or rolled up towels firmly against it. Once  you are able to get out of bed, walk around indoors and cough well. You may stop using the incentive spirometer when instructed by your caregiver.  RISKS AND COMPLICATIONS  Take your time so you do not get dizzy or light-headed.  If you are in pain, you may need to take or ask for pain medication before doing incentive spirometry. It is harder to take a deep breath if you are having pain. AFTER USE  Rest and breathe slowly and easily.  It can be helpful to keep track of a log of your progress. Your caregiver can provide you with a simple table to help with this. If you are using the spirometer at home, follow these instructions: [place] IF:   You are having difficultly using the spirometer.  You have trouble using the spirometer as often as instructed.  Your pain medication is not giving enough relief while using the spirometer.  You develop fever of 100.5 F (38.1 C) or higher. SEEK IMMEDIATE MEDICAL CARE IF:   You cough up bloody sputum that had not been present before.  You develop fever of 102 F (38.9 C) or greater.  You develop worsening pain at or near the incision site. MAKE SURE YOU:   Understand these instructions.  Will watch your condition.  Will get help right away if you are not doing well or get worse. Document Released: 07/14/2006 Document Revised: 05/26/2011 Document Reviewed: 09/14/2006 ExitCare Patient Information 2014 ExitCare, [place].   ________________________________________________________________________  WHAT IS A BLOOD TRANSFUSION? Blood Transfusion Information  A transfusion is the replacement of blood or some of its parts. Blood is made up of multiple cells which provide different functions.  Red blood cells carry oxygen and are used for blood loss replacement.  White blood cells fight against infection.  Platelets control bleeding.  Plasma helps clot blood.  Other blood products are available for specialized needs, such as  hemophilia or other clotting disorders. BEFORE THE TRANSFUSION  Who gives blood for transfusions?   Healthy volunteers who are fully evaluated to make sure their blood is safe. This is blood bank blood. Transfusion therapy is the safest it has ever been in the practice of medicine. Before blood is taken from a donor, a complete history is taken to make sure that person has no history of diseases nor engages in risky social behavior (examples are intravenous drug use or sexual activity with multiple partners). The donor's travel history is screened to minimize risk of transmitting infections, such as malaria. The donated blood is tested for signs of infectious diseases, such as HIV and hepatitis. The blood is then tested to be sure it is compatible with you in order to minimize the chance of a transfusion reaction. If you or a relative donates blood, this is often done in anticipation of surgery and is not appropriate for emergency situations. It takes many days to process the donated blood. RISKS AND COMPLICATIONS Although transfusion therapy is very safe and saves many lives, the main dangers of transfusion include:   Getting an infectious disease.  Developing a transfusion reaction. This is an allergic reaction to something in the blood you were given. Every precaution is taken to prevent  this. The decision to have a blood transfusion has been considered carefully by your caregiver before blood is given. Blood is not given unless the benefits outweigh the risks. AFTER THE TRANSFUSION  Right after receiving a blood transfusion, you will usually feel much better and more energetic. This is especially true if your red blood cells have gotten low (anemic). The transfusion raises the level of the red blood cells which carry oxygen, and this usually causes an energy increase.  The nurse administering the transfusion will monitor you carefully for complications. HOME CARE INSTRUCTIONS  No special  instructions are needed after a transfusion. You may find your energy is better. Speak with your caregiver about any limitations on activity for underlying diseases you may have. SEEK MEDICAL CARE IF:   Your condition is not improving after your transfusion.  You develop redness or irritation at the intravenous (IV) site. SEEK IMMEDIATE MEDICAL CARE IF:  Any of the following symptoms occur over the next 12 hours:  Shaking chills.  You have a temperature by mouth above 102 F (38.9 C), not controlled by medicine.  Chest, back, or muscle pain.  People around you feel you are not acting correctly or are confused.  Shortness of breath or difficulty breathing.  Dizziness and fainting.  You get a rash or develop hives.  You have a decrease in urine output.  Your urine turns a dark color or changes to pink, red, or brown. Any of the following symptoms occur over the next 10 days:  You have a temperature by mouth above 102 F (38.9 C), not controlled by medicine.  Shortness of breath.  Weakness after normal activity.  The white part of the eye turns yellow (jaundice).  You have a decrease in the amount of urine or are urinating less often.  Your urine turns a dark color or changes to pink, red, or brown. Document Released: 02/29/2000 Document Revised: 05/26/2011 Document Reviewed: 10/18/2007 ExitCare Patient Information 2014 ExitCare, [place].  _______________________________   CLEAR LIQUID DIET   Foods Allowed                                                                     Foods Excluded  Coffee and tea, regular and decaf                             liquids that you cannot  Plain Jell-O in any flavor                                             see through such as: Fruit ices (not with fruit pulp)                                     milk, soups, orange juice  Iced Popsicles                                    All solid food  Carbonated beverages, regular and diet                                     Cranberry, grape and apple juices Sports drinks like Gatorade Lightly seasoned clear broth or consume(fat free) Sugar, honey syrup  Sample Menu Breakfast                                Lunch                                     Supper Cranberry juice                    Beef broth                            Chicken broth Jell-O                                     Grape juice                           Apple juice Coffee or tea                        Jell-O                                      Popsicle                                                Coffee or tea                        Coffee or tea  _____________________________________________________________________

## 2015-04-17 NOTE — Progress Notes (Signed)
01/29/2015-Pre-operative clearance from Dr. Radford Pax on chart and EKG noted in EPIC. 07/18/2014-noted CXR 1 view in EPIC.

## 2015-04-19 ENCOUNTER — Encounter (HOSPITAL_COMMUNITY): Payer: Self-pay

## 2015-04-19 ENCOUNTER — Encounter (HOSPITAL_COMMUNITY)
Admission: RE | Admit: 2015-04-19 | Discharge: 2015-04-19 | Disposition: A | Discharge status: Home / Self Care | Payer: 59 | Source: Ambulatory Visit | Attending: Orthopedic Surgery | Admitting: Orthopedic Surgery

## 2015-04-19 DIAGNOSIS — Z01812 Encounter for preprocedural laboratory examination: Secondary | ICD-10-CM | POA: Insufficient documentation

## 2015-04-19 DIAGNOSIS — M1612 Unilateral primary osteoarthritis, left hip: Secondary | ICD-10-CM | POA: Insufficient documentation

## 2015-04-19 DIAGNOSIS — Z0183 Encounter for blood typing: Secondary | ICD-10-CM | POA: Diagnosis not present

## 2015-04-19 LAB — CBC
HCT: 37.6 % (ref 36.0–46.0)
Hemoglobin: 11.8 g/dL — ABNORMAL LOW (ref 12.0–15.0)
MCH: 26.3 pg (ref 26.0–34.0)
MCHC: 31.4 g/dL (ref 30.0–36.0)
MCV: 83.7 fL (ref 78.0–100.0)
PLATELETS: 396 10*3/uL (ref 150–400)
RBC: 4.49 MIL/uL (ref 3.87–5.11)
RDW: 15.8 % — ABNORMAL HIGH (ref 11.5–15.5)
WBC: 10.4 10*3/uL (ref 4.0–10.5)

## 2015-04-19 LAB — APTT: APTT: 28 s (ref 24–37)

## 2015-04-19 LAB — COMPREHENSIVE METABOLIC PANEL
ALBUMIN: 4 g/dL (ref 3.5–5.0)
ALT: 16 U/L (ref 14–54)
AST: 16 U/L (ref 15–41)
Alkaline Phosphatase: 68 U/L (ref 38–126)
Anion gap: 8 (ref 5–15)
BUN: 17 mg/dL (ref 6–20)
CHLORIDE: 106 mmol/L (ref 101–111)
CO2: 28 mmol/L (ref 22–32)
CREATININE: 1.04 mg/dL — AB (ref 0.44–1.00)
Calcium: 8.9 mg/dL (ref 8.9–10.3)
GFR calc non Af Amer: 56 mL/min — ABNORMAL LOW (ref >60)
GLUCOSE: 118 mg/dL — AB (ref 65–99)
Potassium: 4 mmol/L (ref 3.5–5.1)
SODIUM: 142 mmol/L (ref 135–145)
Total Bilirubin: 0.4 mg/dL (ref 0.3–1.2)
Total Protein: 6.8 g/dL (ref 6.5–8.1)

## 2015-04-19 LAB — URINALYSIS, ROUTINE W REFLEX MICROSCOPIC
Glucose, UA: NEGATIVE mg/dL
KETONES UR: NEGATIVE mg/dL
LEUKOCYTES UA: NEGATIVE
NITRITE: NEGATIVE
PROTEIN: 30 mg/dL — AB
Specific Gravity, Urine: 1.03 (ref 1.005–1.030)
pH: 5.5 (ref 5.0–8.0)

## 2015-04-19 LAB — URINE MICROSCOPIC-ADD ON

## 2015-04-19 LAB — SURGICAL PCR SCREEN
MRSA, PCR: NEGATIVE
STAPHYLOCOCCUS AUREUS: NEGATIVE

## 2015-04-19 LAB — PROTIME-INR
INR: 1.02 (ref 0.00–1.49)
PROTHROMBIN TIME: 13.6 s (ref 11.6–15.2)

## 2015-04-25 ENCOUNTER — Inpatient Hospital Stay (HOSPITAL_COMMUNITY): Payer: 59

## 2015-04-25 ENCOUNTER — Inpatient Hospital Stay (HOSPITAL_COMMUNITY): Payer: 59 | Admitting: Anesthesiology

## 2015-04-25 ENCOUNTER — Encounter (HOSPITAL_COMMUNITY)
Admission: AD | Disposition: A | Discharge status: Home Health | Payer: Self-pay | Source: Ambulatory Visit | Attending: Orthopedic Surgery

## 2015-04-25 ENCOUNTER — Inpatient Hospital Stay (HOSPITAL_COMMUNITY)
Admission: AD | Admit: 2015-04-25 | Discharge: 2015-04-27 | DRG: 470 | Disposition: A | Discharge status: Home Health | Payer: 59 | Source: Ambulatory Visit | Attending: Orthopedic Surgery | Admitting: Orthopedic Surgery

## 2015-04-25 ENCOUNTER — Encounter (HOSPITAL_COMMUNITY): Payer: Self-pay | Admitting: *Deleted

## 2015-04-25 DIAGNOSIS — Z01812 Encounter for preprocedural laboratory examination: Secondary | ICD-10-CM | POA: Diagnosis not present

## 2015-04-25 DIAGNOSIS — Z96649 Presence of unspecified artificial hip joint: Secondary | ICD-10-CM

## 2015-04-25 DIAGNOSIS — Z8673 Personal history of transient ischemic attack (TIA), and cerebral infarction without residual deficits: Secondary | ICD-10-CM

## 2015-04-25 DIAGNOSIS — K449 Diaphragmatic hernia without obstruction or gangrene: Secondary | ICD-10-CM | POA: Diagnosis present

## 2015-04-25 DIAGNOSIS — I10 Essential (primary) hypertension: Secondary | ICD-10-CM | POA: Diagnosis present

## 2015-04-25 DIAGNOSIS — F419 Anxiety disorder, unspecified: Secondary | ICD-10-CM | POA: Diagnosis present

## 2015-04-25 DIAGNOSIS — Z96651 Presence of right artificial knee joint: Secondary | ICD-10-CM | POA: Diagnosis present

## 2015-04-25 DIAGNOSIS — K219 Gastro-esophageal reflux disease without esophagitis: Secondary | ICD-10-CM | POA: Diagnosis present

## 2015-04-25 DIAGNOSIS — M25552 Pain in left hip: Secondary | ICD-10-CM | POA: Diagnosis present

## 2015-04-25 DIAGNOSIS — Z79899 Other long term (current) drug therapy: Secondary | ICD-10-CM

## 2015-04-25 DIAGNOSIS — M1612 Unilateral primary osteoarthritis, left hip: Principal | ICD-10-CM | POA: Diagnosis present

## 2015-04-25 DIAGNOSIS — E7800 Pure hypercholesterolemia, unspecified: Secondary | ICD-10-CM | POA: Diagnosis present

## 2015-04-25 DIAGNOSIS — Z981 Arthrodesis status: Secondary | ICD-10-CM

## 2015-04-25 DIAGNOSIS — M169 Osteoarthritis of hip, unspecified: Secondary | ICD-10-CM | POA: Diagnosis present

## 2015-04-25 HISTORY — PX: TOTAL HIP ARTHROPLASTY: SHX124

## 2015-04-25 LAB — TYPE AND SCREEN
ABO/RH(D): A NEG
ANTIBODY SCREEN: NEGATIVE

## 2015-04-25 SURGERY — ARTHROPLASTY, HIP, TOTAL, ANTERIOR APPROACH
Anesthesia: Spinal | Site: Hip | Laterality: Left

## 2015-04-25 MED ORDER — LORATADINE 10 MG PO TABS
10.0000 mg | ORAL_TABLET | Freq: Every day | ORAL | Status: DC
  Administered 2015-04-26 – 2015-04-27 (×2): 10 mg via ORAL
  Filled 2015-04-25 (×2): qty 1

## 2015-04-25 MED ORDER — OXYCODONE HCL 5 MG PO TABS
5.0000 mg | ORAL_TABLET | ORAL | Status: DC | PRN
  Administered 2015-04-25 – 2015-04-26 (×8): 10 mg via ORAL
  Filled 2015-04-25 (×8): qty 2

## 2015-04-25 MED ORDER — MIDAZOLAM HCL 5 MG/5ML IJ SOLN
INTRAMUSCULAR | Status: DC | PRN
  Administered 2015-04-25: 2 mg via INTRAVENOUS

## 2015-04-25 MED ORDER — ATORVASTATIN CALCIUM 20 MG PO TABS
20.0000 mg | ORAL_TABLET | Freq: Every day | ORAL | Status: DC
  Administered 2015-04-26 – 2015-04-27 (×2): 20 mg via ORAL
  Filled 2015-04-25 (×3): qty 1

## 2015-04-25 MED ORDER — MENTHOL 3 MG MT LOZG
1.0000 | LOZENGE | OROMUCOSAL | Status: DC | PRN
  Filled 2015-04-25: qty 9

## 2015-04-25 MED ORDER — ACETAMINOPHEN 10 MG/ML IV SOLN
1000.0000 mg | Freq: Once | INTRAVENOUS | Status: AC
  Administered 2015-04-25: 1000 mg via INTRAVENOUS

## 2015-04-25 MED ORDER — FENTANYL CITRATE (PF) 100 MCG/2ML IJ SOLN
25.0000 ug | INTRAMUSCULAR | Status: DC | PRN
  Administered 2015-04-25 (×3): 25 ug via INTRAVENOUS
  Administered 2015-04-25: 50 ug via INTRAVENOUS
  Administered 2015-04-25: 25 ug via INTRAVENOUS

## 2015-04-25 MED ORDER — PROPOFOL 10 MG/ML IV BOLUS
INTRAVENOUS | Status: AC
  Filled 2015-04-25: qty 20

## 2015-04-25 MED ORDER — BUPIVACAINE IN DEXTROSE 0.75-8.25 % IT SOLN
INTRATHECAL | Status: DC | PRN
  Administered 2015-04-25: 15 mL via INTRATHECAL

## 2015-04-25 MED ORDER — FENTANYL CITRATE (PF) 100 MCG/2ML IJ SOLN
INTRAMUSCULAR | Status: AC
  Filled 2015-04-25: qty 2

## 2015-04-25 MED ORDER — PROMETHAZINE HCL 25 MG PO TABS: 25.0000 mg | ORAL_TABLET | ORAL | Status: DC | PRN

## 2015-04-25 MED ORDER — 0.9 % SODIUM CHLORIDE (POUR BTL) OPTIME
TOPICAL | Status: DC | PRN
  Administered 2015-04-25: 1000 mL

## 2015-04-25 MED ORDER — BUPIVACAINE HCL (PF) 0.25 % IJ SOLN
INTRAMUSCULAR | Status: AC
  Filled 2015-04-25: qty 30

## 2015-04-25 MED ORDER — AMLODIPINE BESYLATE 5 MG PO TABS
5.0000 mg | ORAL_TABLET | Freq: Every day | ORAL | Status: DC
  Administered 2015-04-26 – 2015-04-27 (×2): 5 mg via ORAL
  Filled 2015-04-25 (×2): qty 1

## 2015-04-25 MED ORDER — MIDAZOLAM HCL 2 MG/2ML IJ SOLN
INTRAMUSCULAR | Status: AC
  Filled 2015-04-25: qty 2

## 2015-04-25 MED ORDER — DEXAMETHASONE SODIUM PHOSPHATE 10 MG/ML IJ SOLN
10.0000 mg | Freq: Once | INTRAMUSCULAR | Status: AC
  Administered 2015-04-26: 10 mg via INTRAVENOUS
  Filled 2015-04-25: qty 1

## 2015-04-25 MED ORDER — LIDOCAINE HCL (CARDIAC) 20 MG/ML IV SOLN
INTRAVENOUS | Status: AC
  Filled 2015-04-25: qty 5

## 2015-04-25 MED ORDER — CEFAZOLIN SODIUM-DEXTROSE 2-3 GM-% IV SOLR
2.0000 g | Freq: Four times a day (QID) | INTRAVENOUS | Status: AC
  Administered 2015-04-25 – 2015-04-26 (×2): 2 g via INTRAVENOUS
  Filled 2015-04-25 (×2): qty 50

## 2015-04-25 MED ORDER — HYDROMORPHONE HCL 1 MG/ML IJ SOLN
INTRAMUSCULAR | Status: AC
  Filled 2015-04-25: qty 1

## 2015-04-25 MED ORDER — LACTATED RINGERS IV SOLN: INTRAVENOUS | Status: DC

## 2015-04-25 MED ORDER — PANTOPRAZOLE SODIUM 40 MG PO TBEC
40.0000 mg | DELAYED_RELEASE_TABLET | Freq: Every day | ORAL | Status: DC
  Filled 2015-04-25: qty 1

## 2015-04-25 MED ORDER — ONDANSETRON HCL 4 MG/2ML IJ SOLN
INTRAMUSCULAR | Status: AC
  Filled 2015-04-25: qty 2

## 2015-04-25 MED ORDER — PHENYLEPHRINE 40 MCG/ML (10ML) SYRINGE FOR IV PUSH (FOR BLOOD PRESSURE SUPPORT)
PREFILLED_SYRINGE | INTRAVENOUS | Status: AC
  Filled 2015-04-25: qty 10

## 2015-04-25 MED ORDER — SODIUM CHLORIDE 0.9 % IV SOLN: INTRAVENOUS | Status: DC

## 2015-04-25 MED ORDER — ACETAMINOPHEN 500 MG PO TABS
1000.0000 mg | ORAL_TABLET | Freq: Four times a day (QID) | ORAL | Status: AC
  Administered 2015-04-25 – 2015-04-26 (×3): 1000 mg via ORAL
  Filled 2015-04-25 (×4): qty 2

## 2015-04-25 MED ORDER — ONDANSETRON HCL 4 MG/2ML IJ SOLN
INTRAMUSCULAR | Status: DC | PRN
  Administered 2015-04-25 (×2): 2 mg via INTRAVENOUS

## 2015-04-25 MED ORDER — PHENYLEPHRINE HCL 10 MG/ML IJ SOLN
INTRAMUSCULAR | Status: AC
  Filled 2015-04-25: qty 1

## 2015-04-25 MED ORDER — FLUTICASONE PROPIONATE 50 MCG/ACT NA SUSP
2.0000 | Freq: Two times a day (BID) | NASAL | Status: DC
  Administered 2015-04-25 – 2015-04-27 (×4): 2 via NASAL
  Filled 2015-04-25: qty 16

## 2015-04-25 MED ORDER — ONDANSETRON HCL 4 MG PO TABS: 4.0000 mg | ORAL_TABLET | Freq: Four times a day (QID) | ORAL | Status: DC | PRN

## 2015-04-25 MED ORDER — CEFAZOLIN SODIUM-DEXTROSE 2-3 GM-% IV SOLR
INTRAVENOUS | Status: AC
  Filled 2015-04-25: qty 50

## 2015-04-25 MED ORDER — PROMETHAZINE HCL 25 MG/ML IJ SOLN
6.2500 mg | INTRAMUSCULAR | Status: DC | PRN
Start: 2015-04-25 — End: 2015-04-25

## 2015-04-25 MED ORDER — ACETAMINOPHEN 10 MG/ML IV SOLN
INTRAVENOUS | Status: AC
  Filled 2015-04-25: qty 100

## 2015-04-25 MED ORDER — METHOCARBAMOL 500 MG PO TABS
500.0000 mg | ORAL_TABLET | Freq: Four times a day (QID) | ORAL | Status: DC | PRN
  Administered 2015-04-26 – 2015-04-27 (×4): 500 mg via ORAL
  Filled 2015-04-25 (×4): qty 1

## 2015-04-25 MED ORDER — ACETAMINOPHEN 325 MG PO TABS
650.0000 mg | ORAL_TABLET | Freq: Four times a day (QID) | ORAL | Status: DC | PRN
  Administered 2015-04-26: 650 mg via ORAL
  Filled 2015-04-25: qty 2

## 2015-04-25 MED ORDER — HYDROMORPHONE HCL 1 MG/ML IJ SOLN
0.2500 mg | INTRAMUSCULAR | Status: DC | PRN
  Administered 2015-04-25 (×4): 0.5 mg via INTRAVENOUS

## 2015-04-25 MED ORDER — TRANEXAMIC ACID 1000 MG/10ML IV SOLN
2000.0000 mg | Freq: Once | INTRAVENOUS | Status: DC
  Filled 2015-04-25: qty 20

## 2015-04-25 MED ORDER — MEPERIDINE HCL 50 MG/ML IJ SOLN: 6.2500 mg | INTRAMUSCULAR | Status: DC | PRN

## 2015-04-25 MED ORDER — CEFAZOLIN SODIUM-DEXTROSE 2-3 GM-% IV SOLR
2.0000 g | INTRAVENOUS | Status: AC
  Administered 2015-04-25: 2 g via INTRAVENOUS

## 2015-04-25 MED ORDER — BUPIVACAINE HCL (PF) 0.25 % IJ SOLN
INTRAMUSCULAR | Status: DC | PRN
  Administered 2015-04-25: 30 mL

## 2015-04-25 MED ORDER — ACETAMINOPHEN 650 MG RE SUPP: 650.0000 mg | Freq: Four times a day (QID) | RECTAL | Status: DC | PRN

## 2015-04-25 MED ORDER — FLEET ENEMA 7-19 GM/118ML RE ENEM
1.0000 | ENEMA | Freq: Once | RECTAL | Status: DC | PRN
Start: 2015-04-25 — End: 2015-04-27

## 2015-04-25 MED ORDER — ALPRAZOLAM 1 MG PO TABS
1.0000 mg | ORAL_TABLET | Freq: Every day | ORAL | Status: DC
  Administered 2015-04-25 – 2015-04-26 (×2): 1 mg via ORAL
  Filled 2015-04-25 (×2): qty 1

## 2015-04-25 MED ORDER — RIVAROXABAN 10 MG PO TABS
10.0000 mg | ORAL_TABLET | Freq: Every day | ORAL | Status: DC
  Administered 2015-04-26 – 2015-04-27 (×2): 10 mg via ORAL
  Filled 2015-04-25 (×3): qty 1

## 2015-04-25 MED ORDER — PHENOL 1.4 % MT LIQD: 1.0000 | OROMUCOSAL | Status: DC | PRN

## 2015-04-25 MED ORDER — DEXAMETHASONE SODIUM PHOSPHATE 10 MG/ML IJ SOLN
10.0000 mg | Freq: Once | INTRAMUSCULAR | Status: AC
  Administered 2015-04-25: 10 mg via INTRAVENOUS

## 2015-04-25 MED ORDER — MORPHINE SULFATE (PF) 2 MG/ML IV SOLN
1.0000 mg | INTRAVENOUS | Status: DC | PRN
  Administered 2015-04-25 – 2015-04-26 (×3): 1 mg via INTRAVENOUS
  Filled 2015-04-25 (×3): qty 1

## 2015-04-25 MED ORDER — HYDROMORPHONE HCL 1 MG/ML IJ SOLN
0.2500 mg | INTRAMUSCULAR | Status: DC | PRN
  Administered 2015-04-25 (×2): 0.5 mg via INTRAVENOUS

## 2015-04-25 MED ORDER — TRAMADOL HCL 50 MG PO TABS
50.0000 mg | ORAL_TABLET | Freq: Four times a day (QID) | ORAL | Status: DC | PRN
  Administered 2015-04-26: 50 mg via ORAL
  Filled 2015-04-25: qty 1

## 2015-04-25 MED ORDER — ONDANSETRON HCL 4 MG/2ML IJ SOLN
4.0000 mg | Freq: Four times a day (QID) | INTRAMUSCULAR | Status: DC | PRN
  Administered 2015-04-27: 4 mg via INTRAVENOUS
  Filled 2015-04-25: qty 2

## 2015-04-25 MED ORDER — PROPOFOL 500 MG/50ML IV EMUL
INTRAVENOUS | Status: DC | PRN
  Administered 2015-04-25: 125 ug/kg/min via INTRAVENOUS

## 2015-04-25 MED ORDER — DOCUSATE SODIUM 100 MG PO CAPS
100.0000 mg | ORAL_CAPSULE | Freq: Two times a day (BID) | ORAL | Status: DC
  Administered 2015-04-25 – 2015-04-27 (×4): 100 mg via ORAL

## 2015-04-25 MED ORDER — PROPOFOL 10 MG/ML IV BOLUS
INTRAVENOUS | Status: AC
  Filled 2015-04-25: qty 40

## 2015-04-25 MED ORDER — BISACODYL 10 MG RE SUPP: 10.0000 mg | Freq: Every day | RECTAL | Status: DC | PRN

## 2015-04-25 MED ORDER — LIDOCAINE HCL (CARDIAC) 20 MG/ML IV SOLN
INTRAVENOUS | Status: DC | PRN
  Administered 2015-04-25: 60 mg via INTRAVENOUS

## 2015-04-25 MED ORDER — NORTRIPTYLINE HCL 25 MG PO CAPS
25.0000 mg | ORAL_CAPSULE | Freq: Every day | ORAL | Status: DC
  Administered 2015-04-26: 25 mg via ORAL
  Filled 2015-04-25 (×3): qty 1

## 2015-04-25 MED ORDER — LACTATED RINGERS IV SOLN
INTRAVENOUS | Status: DC | PRN
Start: 2015-04-25 — End: 2015-04-25
  Administered 2015-04-25 (×3): via INTRAVENOUS

## 2015-04-25 MED ORDER — CHLORHEXIDINE GLUCONATE 4 % EX LIQD: 60.0000 mL | Freq: Once | CUTANEOUS | Status: DC

## 2015-04-25 MED ORDER — POLYETHYLENE GLYCOL 3350 17 G PO PACK: 17.0000 g | PACK | Freq: Every day | ORAL | Status: DC | PRN

## 2015-04-25 MED ORDER — TRICHLOROACETIC ACID 80 % EX LIQD
CUTANEOUS | Status: DC | PRN
  Administered 2015-04-25: 1 via TOPICAL

## 2015-04-25 MED ORDER — SODIUM CHLORIDE 0.9 % IV SOLN
INTRAVENOUS | Status: DC
  Administered 2015-04-25: 75 mL/h via INTRAVENOUS

## 2015-04-25 MED ORDER — PHENYLEPHRINE HCL 10 MG/ML IJ SOLN
10.0000 mg | INTRAVENOUS | Status: DC | PRN
  Administered 2015-04-25: 40 ug/min via INTRAVENOUS

## 2015-04-25 MED ORDER — STERILE WATER FOR IRRIGATION IR SOLN
Status: DC | PRN
  Administered 2015-04-25: 1000 mL

## 2015-04-25 MED ORDER — SUCRALFATE 1 G PO TABS
1.0000 g | ORAL_TABLET | Freq: Two times a day (BID) | ORAL | Status: DC | PRN
  Filled 2015-04-25: qty 1

## 2015-04-25 MED ORDER — PROPOFOL 10 MG/ML IV BOLUS
INTRAVENOUS | Status: DC | PRN
  Administered 2015-04-25: 20 mg via INTRAVENOUS
  Administered 2015-04-25: 30 mg via INTRAVENOUS
  Administered 2015-04-25 (×2): 20 mg via INTRAVENOUS

## 2015-04-25 MED ORDER — PHENYLEPHRINE HCL 10 MG/ML IJ SOLN
INTRAMUSCULAR | Status: DC | PRN
  Administered 2015-04-25 (×5): 80 ug via INTRAVENOUS

## 2015-04-25 MED ORDER — METHOCARBAMOL 1000 MG/10ML IJ SOLN
500.0000 mg | Freq: Four times a day (QID) | INTRAVENOUS | Status: DC | PRN
  Administered 2015-04-25 (×2): 500 mg via INTRAVENOUS
  Filled 2015-04-25 (×4): qty 5

## 2015-04-25 MED ORDER — DEXAMETHASONE SODIUM PHOSPHATE 10 MG/ML IJ SOLN
INTRAMUSCULAR | Status: AC
  Filled 2015-04-25: qty 1

## 2015-04-25 MED ORDER — DIPHENHYDRAMINE HCL 12.5 MG/5ML PO ELIX: 12.5000 mg | ORAL_SOLUTION | ORAL | Status: DC | PRN

## 2015-04-25 SURGICAL SUPPLY — 41 items
BAG DECANTER FOR FLEXI CONT (MISCELLANEOUS) ×3 IMPLANT
BAG SPEC THK2 15X12 ZIP CLS (MISCELLANEOUS)
BAG ZIPLOCK 12X15 (MISCELLANEOUS) IMPLANT
BLADE SAG 18X100X1.27 (BLADE) ×3 IMPLANT
CAPT HIP TOTAL 2 ×2 IMPLANT
CLOSURE WOUND 1/2 X4 (GAUZE/BANDAGES/DRESSINGS) ×2
CLOTH BEACON ORANGE TIMEOUT ST (SAFETY) ×3 IMPLANT
COVER PERINEAL POST (MISCELLANEOUS) ×3 IMPLANT
DECANTER SPIKE VIAL GLASS SM (MISCELLANEOUS) ×3 IMPLANT
DRAPE STERI IOBAN 125X83 (DRAPES) ×3 IMPLANT
DRAPE U-SHAPE 47X51 STRL (DRAPES) ×6 IMPLANT
DRSG ADAPTIC 3X8 NADH LF (GAUZE/BANDAGES/DRESSINGS) ×3 IMPLANT
DRSG MEPILEX BORDER 4X4 (GAUZE/BANDAGES/DRESSINGS) ×3 IMPLANT
DRSG MEPILEX BORDER 4X8 (GAUZE/BANDAGES/DRESSINGS) ×3 IMPLANT
DURAPREP 26ML APPLICATOR (WOUND CARE) ×3 IMPLANT
ELECT REM PT RETURN 9FT ADLT (ELECTROSURGICAL) ×3
ELECTRODE REM PT RTRN 9FT ADLT (ELECTROSURGICAL) ×1 IMPLANT
EVACUATOR 1/8 PVC DRAIN (DRAIN) ×3 IMPLANT
GLOVE BIO SURGEON STRL SZ 6.5 (GLOVE) ×1 IMPLANT
GLOVE BIO SURGEON STRL SZ7.5 (GLOVE) ×3 IMPLANT
GLOVE BIO SURGEON STRL SZ8 (GLOVE) ×6 IMPLANT
GLOVE BIO SURGEONS STRL SZ 6.5 (GLOVE) ×1
GLOVE BIOGEL PI IND STRL 6.5 (GLOVE) IMPLANT
GLOVE BIOGEL PI IND STRL 8 (GLOVE) ×2 IMPLANT
GLOVE BIOGEL PI INDICATOR 6.5 (GLOVE) ×4
GLOVE BIOGEL PI INDICATOR 8 (GLOVE) ×4
GLOVE SURG SS PI 6.5 STRL IVOR (GLOVE) ×2 IMPLANT
GOWN STRL REUS W/TWL LRG LVL3 (GOWN DISPOSABLE) ×5 IMPLANT
GOWN STRL REUS W/TWL XL LVL3 (GOWN DISPOSABLE) ×5 IMPLANT
PACK ANTERIOR HIP CUSTOM (KITS) ×3 IMPLANT
SPONGE SURGIFOAM ABS GEL 100 (HEMOSTASIS) ×2 IMPLANT
STRIP CLOSURE SKIN 1/2X4 (GAUZE/BANDAGES/DRESSINGS) ×3 IMPLANT
SUT ETHIBOND NAB CT1 #1 30IN (SUTURE) ×3 IMPLANT
SUT MNCRL AB 4-0 PS2 18 (SUTURE) ×3 IMPLANT
SUT VIC AB 2-0 CT1 27 (SUTURE) ×9
SUT VIC AB 2-0 CT1 TAPERPNT 27 (SUTURE) ×2 IMPLANT
SUT VLOC 180 0 24IN GS25 (SUTURE) ×3 IMPLANT
SYR 50ML LL SCALE MARK (SYRINGE) ×2 IMPLANT
TRAY FOLEY W/METER SILVER 14FR (SET/KITS/TRAYS/PACK) ×3 IMPLANT
TRAY FOLEY W/METER SILVER 16FR (SET/KITS/TRAYS/PACK) ×1 IMPLANT
YANKAUER SUCT BULB TIP 10FT TU (MISCELLANEOUS) ×3 IMPLANT

## 2015-04-25 NOTE — Anesthesia Preprocedure Evaluation (Signed)
Anesthesia Evaluation  Patient identified by MRN, date of birth, ID band Patient awake    Reviewed: Allergy & Precautions, H&P , NPO status , Patient's Chart, lab work & pertinent test results  History of Anesthesia Complications (+) PONV and history of anesthetic complications  Airway Mallampati: III  TM Distance: <3 FB     Dental  (+) Teeth Intact, Dental Advidsory Given   Pulmonary shortness of breath and with exertion, pneumonia, resolved,    breath sounds clear to auscultation       Cardiovascular hypertension, Pt. on medications + DOE   Rhythm:regular Rate:Normal     Neuro/Psych Anxiety CVA, No Residual Symptoms    GI/Hepatic hiatal hernia, GERD  Medicated and Poorly Controlled,IBS   Endo/Other  obese  Renal/GU      Musculoskeletal  (+) Arthritis , Osteoarthritis,    Abdominal   Peds  Hematology   Anesthesia Other Findings   Reproductive/Obstetrics                             Anesthesia Physical  Anesthesia Plan  ASA: II  Anesthesia Plan: Spinal   Post-op Pain Management:    Induction: Intravenous  Airway Management Planned: Simple Face Mask  Additional Equipment:   Intra-op Plan:   Post-operative Plan:   Informed Consent: I have reviewed the patients History and Physical, chart, labs and discussed the procedure including the risks, benefits and alternatives for the proposed anesthesia with the patient or authorized representative who has indicated his/her understanding and acceptance.   Dental Advisory Given  Plan Discussed with: Anesthesiologist, CRNA and Surgeon  Anesthesia Plan Comments:         Anesthesia Quick Evaluation

## 2015-04-25 NOTE — Progress Notes (Signed)
Utilization review completed.  

## 2015-04-25 NOTE — Transfer of Care (Signed)
Immediate Anesthesia Transfer of Care Note  Patient: Angie Olson  Procedure(s) Performed: Procedure(s): LEFT TOTAL HIP ARTHROPLASTY ANTERIOR APPROACH (Left)  Patient Location: PACU  Anesthesia Type:MAC and Spinal  Level of Consciousness: awake, alert  and patient cooperative  Airway & Oxygen Therapy: Patient Spontanous Breathing and Patient connected to face mask oxygen  Post-op Assessment: Report given to RN and Post -op Vital signs reviewed and stable  Post vital signs: Reviewed and stable  Last Vitals:  Filed Vitals:   04/25/15 1137  BP: 153/95  Pulse: 103  Temp: 36.8 C  Resp: 18    Complications: No apparent anesthesia complications

## 2015-04-25 NOTE — Interval H&P Note (Signed)
History and Physical Interval Note:  04/25/2015 1:18 PM  Angie Olson  has presented today for surgery, with the diagnosis of OA LEFT HIP  The various methods of treatment have been discussed with the patient and family. After consideration of risks, benefits and other options for treatment, the patient has consented to  Procedure(s): LEFT TOTAL HIP ARTHROPLASTY ANTERIOR APPROACH (Left) as a surgical intervention .  The patient's history has been reviewed, patient examined, no change in status, stable for surgery.  I have reviewed the patient's chart and labs.  Questions were answered to the patient's satisfaction.     Gearlean Alf

## 2015-04-25 NOTE — Anesthesia Procedure Notes (Signed)
Spinal Patient location during procedure: OR Staffing Anesthesiologist: Genever Hentges Performed by: anesthesiologist  Preanesthetic Checklist Completed: patient identified, site marked, surgical consent, pre-op evaluation, timeout performed, IV checked, risks and benefits discussed and monitors and equipment checked Spinal Block Patient position: sitting Prep: Betadine Patient monitoring: heart rate, continuous pulse ox and blood pressure Approach: right paramedian Location: L3-4 Injection technique: single-shot Needle Needle type: Spinocan  Needle gauge: 22 G Needle length: 9 cm Additional Notes Expiration date of kit checked and confirmed. Patient tolerated procedure well, without complications.     

## 2015-04-25 NOTE — H&P (View-Only) (Signed)
Angie Olson DOB: 03-27-1950 Married / Language: English / Race: White Female Date of Admission:  04/25/2015 CC:  Left Hip Pain History of Present Illness The patient is a 65 year old female who comes in for a preoperative History and Physical. The patient is scheduled for a left total hip arthroplasty (anterior) to be performed by Dr. Dione Plover. Aluisio, MD at [facility] on 04-25-2015. The patient was seen in referral from Dr. Saintclair Halsted. The patient reports left hip problems including pain symptoms that have been present for 6 month(s). The symptoms began without any known injury. Symptoms reported include hip pain The patient reports symptoms radiating to the: left groin. The patient describes the hip problem as aching. Onset of symptoms was gradual.The patient feels as if their symptoms are does feel they are worsening. Symptoms are exacerbated by flexing hip and sitting. Current treatment includes opioid analgesics (Oxycodone). Prior to being seen the patient was previously evaluated by a colleague. Previous workup for this problem has included hip MRI. She states that the left hip has been bothering her for six months to a year now. She has had a lot of surgery in the past, here with three major operations including two spine surgeries. She states that she has had a significant amount of pain in her back as well as in the left groin and thigh area. She has even had some discomfort in her right knee, which we replaced over a year ago. The hip pain is the thing that is bothering her the most. She is having to put over her weight on her right leg due to the pain in the left hip. It is in her groin, radiating to her thigh. It hurts during the day as well as at night. She is not having any significant weakness at this time. It has been progressive in nature and she is ready to proceed with surgery. They have been treated conservatively in the past for the above stated problem and despite conservative  measures, they continue to have progressive pain and severe functional limitations and dysfunction. They have failed non-operative management including home exercise, medications, and injections. It is felt that they would benefit from undergoing total joint replacement. Risks and benefits of the procedure have been discussed with the patient and they elect to proceed with surgery. There are no active contraindications to surgery such as ongoing infection or rapidly progressive neurological disease.  Problem List/Past Medical Primary osteoarthritis of left hip (M16.12)  Shoulder impingement, left (M75.42)  Hypercholesterolemia  Irritable bowel syndrome  Rheumatoid Arthritis  Cerebrovascular Accident  Mini Stroke Gastroesophageal Reflux Disease  High blood pressure  Skin Cancer  Melanoma Carotid Arterial Disease  Shingles  Allergies Dust  Mold Spores  No Known Drug Allergies   Family History Hypertension  mother, father, sister and brother mother and father Congestive Heart Failure  First Degree Relatives. father Osteoarthritis  mother Chronic Obstructive Lung Disease  mother Cancer  brother and grandfather mothers side Diabetes Mellitus  father grandmother fathers side Depression  sister Heart Disease  First Degree Relatives. mother, father, sister and brother  Social History Current work status  retired Previously in rehab  no Alcohol use  social only occasionally per week Drug/Alcohol Rehab (Currently)  no Drug/Alcohol Rehab (Previously)  no Children  2 Illicit drug use  no Living situation  live with spouse Marital status  married Number of flights of stairs before winded  2-3 Pain Contract  no Tobacco / smoke exposure  no Tobacco use  Never smoker. never smoker [place] with husband following the Left TH-AA on 04/25/2015.  Medication History  Leflunomide (20MG  Tablet, Oral) Active. OxyCODONE HCl (10MG  Tablet, Oral)  Active. Cyclobenzaprine HCl (10MG  Tablet, Oral as needed) Active. Nortriptyline HCl (25MG  Capsule, Oral) Active. ALPRAZolam (1MG  Tablet, Oral) Active. (1/2 qhs) Lisinopril (40MG  Tablet, Oral) Active. Atorvastatin Calcium (20MG  Tablet, Oral) Active. AmLODIPine Besylate (5MG  Tablet, Oral) Active. PredniSONE (5MG  Tablet, Oral) Active. NexIUM (40MG  Capsule DR, Oral) Active. ZyrTEC Allergy (10MG  Capsule, Oral) Active. Promethazine HCl (25MG  Tablet, Oral as needed) Active.   Past Surgical History Spinal Fusion  lower back three times Tubal Ligation  Gallbladder Surgery  laporoscopic Ankle Surgery  left Other Surgery  Right hand trigger finger twice and left hand thumb fused Colon Polyp Removal - Colonoscopy  Arthroscopy of Shoulder  left Total Knee Replacement - Right   Review of Systems General Not Present- Chills, Fatigue, Fever, Memory Loss, Night Sweats, Weight Gain and Weight Loss. Skin Not Present- Eczema, Hives, Itching, Lesions and Rash. HEENT Not Present- Dentures, Double Vision, Headache, Hearing Loss, Tinnitus and Visual Loss. Respiratory Not Present- Allergies, Chronic Cough, Coughing up blood, Shortness of breath at rest and Shortness of breath with exertion. Cardiovascular Not Present- Chest Pain, Difficulty Breathing Lying Down, Murmur, Palpitations, Racing/skipping heartbeats and Swelling. Gastrointestinal Not Present- Abdominal Pain, Bloody Stool, Constipation, Diarrhea, Difficulty Swallowing, Heartburn, Jaundice, Loss of appetitie, Nausea and Vomiting. Female Genitourinary Not Present- Blood in Urine, Discharge, Flank Pain, Incontinence, Painful Urination, Urgency, Urinary frequency, Urinary Retention, Urinating at Night and Weak urinary stream. Musculoskeletal Present- Joint Pain. Not Present- Back Pain, Joint Swelling, Morning Stiffness, Muscle Pain, Muscle Weakness and Spasms. Neurological Not Present- Blackout spells, Difficulty with balance,  Dizziness, Paralysis, Tremor and Weakness. Psychiatric Not Present- Insomnia.  Vitals Weight: 185 lb Height: 65in Weight was reported by patient. Height was reported by patient. Body Surface Area: 1.91 m Body Mass Index: 30.79 kg/m  BP: 148/90 (Sitting, Left Arm, Standard)   Physical Exam General Mental Status -Alert, cooperative and good historian. General Appearance-pleasant, Not in acute distress. Orientation-Oriented X3. Build & Nutrition-Well nourished and Well developed.  Head and Neck Head-normocephalic, atraumatic . Face Global Assessment - Note: recent liner incision on right side from nose to right side of mouth (melanoma excision). Neck Global Assessment - supple, no bruit auscultated on the right, no bruit auscultated on the left.  Eye Vision-Wears corrective lenses(readers only). Pupil - Bilateral-Regular and Round. Motion - Bilateral-EOMI.  Chest and Lung Exam Auscultation Breath sounds - clear at anterior chest wall and clear at posterior chest wall. Adventitious sounds - No Adventitious sounds.  Cardiovascular Auscultation Rhythm - Regular rate and rhythm. Heart Sounds - S1 WNL and S2 WNL. Murmurs & Other Heart Sounds - Auscultation of the heart reveals - No Murmurs.  Abdomen Palpation/Percussion Tenderness - Abdomen is non-tender to palpation. Rigidity (guarding) - Abdomen is soft. Auscultation Auscultation of the abdomen reveals - Bowel sounds normal.  Female Genitourinary Note: Not done, not pertinent to present illness   Musculoskeletal Note: She is alert and oriented, in no apparent distress. Her left hip can be flexed to about 100, about 10 internal rotation, 20 external rotation and 20 abduction. Right hip has normal range of motion. Her right knee shows no swelling. Range about 0 to 125. There is no instability noted about the knee and no tenderness noted.  RADIOGRAPHS Her radiographs AP pelvis and lateral of the  left hip show moderate to advanced  arthritic change in the hip. She is just about bone on bone. Her right knee AP and lateral show a prosthesis in excellent position with no periprosthetic abnormalities.  Assessment & Plan  Primary osteoarthritis of left hip (M16.12)  Note:Surgical Plans: Left Total Hip Replacement - Anterior Approach  Disposition: Home with husband  PCP: Dr. Velna Hatchet - Patient has been seen and given a verbal approval to proceed with surgery. Cards: Dr. Radford Pax - Patient has been seen preoperatively and felt to be stable from a cardiac standpoint for surgery.  Topical TXA - History of recent Melanoma, Stroke, and Carotid Disease  Anesthesia Issues: None excpet some nausea at times.  Signed electronically by Joelene Millin, III PA-C

## 2015-04-25 NOTE — Op Note (Signed)
OPERATIVE REPORT  PREOPERATIVE DIAGNOSIS: Osteoarthritis of the Left hip.   POSTOPERATIVE DIAGNOSIS: Osteoarthritis of the Left  hip.   PROCEDURE: Left total hip arthroplasty, anterior approach.   SURGEON: Gaynelle Arabian, MD   ASSISTANT: [name], PA-C  ANESTHESIA:  Spinal  ESTIMATED BLOOD LOSS:-300 ml    DRAINS: Hemovac x1.   COMPLICATIONS: None   CONDITION: PACU - hemodynamically stable.   BRIEF CLINICAL NOTE: Angie Olson is a 65 y.o. female who has advanced end-  stage arthritis of her Left  hip with progressively worsening pain and  dysfunction.The patient has failed nonoperative management and presents for  total hip arthroplasty.   PROCEDURE IN DETAIL: After successful administration of spinal  anesthetic, the traction boots for the [facility] bed were placed on both  feet and the patient was placed onto the [facility] bed, boots placed into the leg  holders. The Left hip was then isolated from the perineum with plastic  drapes and prepped and draped in the usual sterile fashion. ASIS and  greater trochanter were marked and a oblique incision was made, starting  at about 1 cm lateral and 2 cm distal to the ASIS and coursing towards  the anterior cortex of the femur. The skin was cut with a 10 blade  through subcutaneous tissue to the level of the fascia overlying the  tensor fascia lata muscle. The fascia was then incised in line with the  incision at the junction of the anterior third and posterior 2/3rd. The  muscle was teased off the fascia and then the interval between the TFL  and the rectus was developed. The Hohmann retractor was then placed at  the top of the femoral neck over the capsule. The vessels overlying the  capsule were cauterized and the fat on top of the capsule was removed.  A Hohmann retractor was then placed anterior underneath the rectus  femoris to give exposure to the entire anterior capsule. A T-shaped  capsulotomy was performed. The  edges were tagged and the femoral head  was identified.       Osteophytes are removed off the superior acetabulum.  The femoral neck was then cut in situ with an oscillating saw. Traction  was then applied to the left lower extremity utilizing the [facility]  traction. The femoral head was then removed. Retractors were placed  around the acetabulum and then circumferential removal of the labrum was  performed. Osteophytes were also removed. Reaming starts at 43 mm to  medialize and  Increased in 2 mm increments to 47 mm. We reamed in  approximately 40 degrees of abduction, 20 degrees anteversion. A 48 mm  pinnacle acetabular shell was then impacted in anatomic position under  fluoroscopic guidance with excellent purchase. We did not need to place  any additional dome screws. A 28 mm neutral + 4 marathon liner was then  placed into the acetabular shell.       The femoral lift was then placed along the lateral aspect of the femur  just distal to the vastus ridge. The leg was  externally rotated and capsule  was stripped off the inferior aspect of the femoral neck down to the  level of the lesser trochanter, this was done with electrocautery. The femur was lifted after this was performed. The  leg was then placed and extended in adducted position to essentially delivering the femur. We also removed the capsule superiorly and the  piriformis from the  piriformis fossa to gain excellent exposure of the  proximal femur. Rongeur was used to remove some cancellous bone to get  into the lateral portion of the proximal femur for placement of the  initial starter reamer. The starter broaches was placed  the starter broach  and was shown to go down the center of the canal. Broaching  with the  Corail system was then performed starting at size 8, coursing  Up to size 10. A size 10 had excellent torsional and rotational  and axial stability. The trial high offset neck was then placed  with a 28 + 1.5 trial  head. The hip was then reduced. We confirmed that  the stem was in the canal both on AP and lateral x-rays. It also has excellent sizing. The hip was reduced with outstanding stability through full extension, full external rotation,  and then flexion in adduction internal rotation. AP pelvis was taken  and the leg lengths were measured and found to be exactly equal. Hip  was then dislocated again and the femoral head and neck removed. The  femoral broach was removed. Size 10 Corail stem with a high offset  neck was then impacted into the femur following native anteversion. Has  excellent purchase in the canal. Excellent torsional and rotational and  axial stability. It is confirmed to be in the canal on AP and lateral  fluoroscopic views. The 28 + 1.5 ceramic head was placed and the hip  reduced with outstanding stability. Again AP pelvis was taken and it  confirmed that the leg lengths were equal. The wound was then copiously  irrigated with saline solution and the capsule reattached and repaired  with Ethibond suture. 30 ml of .25% Bupivicaine injected into the capsule and into the edge of the tensor fascia lata as well as subcutaneous tissue. The fascia overlying the tensor fascia lata was  then closed with a running #1 V-Loc. Subcu was closed with interrupted  2-0 Vicryl and subcuticular running 4-0 Monocryl. Incision was cleaned  and dried. Steri-Strips and a bulky sterile dressing applied. Hemovac  drain was hooked to suction and then he was awakened and transported to  recovery in stable condition.        Please note that a surgical assistant was a medical necessity for this procedure to perform it in a safe and expeditious manner. Assistant was necessary to provide appropriate retraction of vital neurovascular structures and to prevent femoral fracture and allow for anatomic placement of the prosthesis.  Gaynelle Arabian, M.D.

## 2015-04-26 LAB — CBC
HCT: 28.1 % — ABNORMAL LOW (ref 36.0–46.0)
Hemoglobin: 8.9 g/dL — ABNORMAL LOW (ref 12.0–15.0)
MCH: 26.3 pg (ref 26.0–34.0)
MCHC: 31.7 g/dL (ref 30.0–36.0)
MCV: 83.1 fL (ref 78.0–100.0)
PLATELETS: 354 10*3/uL (ref 150–400)
RBC: 3.38 MIL/uL — AB (ref 3.87–5.11)
RDW: 15.7 % — ABNORMAL HIGH (ref 11.5–15.5)
WBC: 12.2 10*3/uL — AB (ref 4.0–10.5)

## 2015-04-26 LAB — BASIC METABOLIC PANEL
ANION GAP: 9 (ref 5–15)
BUN: 11 mg/dL (ref 6–20)
CO2: 25 mmol/L (ref 22–32)
Calcium: 8.7 mg/dL — ABNORMAL LOW (ref 8.9–10.3)
Chloride: 106 mmol/L (ref 101–111)
Creatinine, Ser: 0.72 mg/dL (ref 0.44–1.00)
GFR calc Af Amer: >60 mL/min (ref >60)
Glucose, Bld: 194 mg/dL — ABNORMAL HIGH (ref 65–99)
POTASSIUM: 3.9 mmol/L (ref 3.5–5.1)
SODIUM: 140 mmol/L (ref 135–145)

## 2015-04-26 MED ORDER — HYDROMORPHONE HCL 2 MG PO TABS
2.0000 mg | ORAL_TABLET | ORAL | Status: DC | PRN
  Administered 2015-04-26: 2 mg via ORAL
  Administered 2015-04-26: 4 mg via ORAL
  Filled 2015-04-26: qty 1
  Filled 2015-04-26: qty 2
  Filled 2015-04-26: qty 1

## 2015-04-26 MED ORDER — NALOXEGOL OXALATE 25 MG PO TABS
25.0000 mg | ORAL_TABLET | Freq: Every day | ORAL | Status: DC
  Administered 2015-04-26 – 2015-04-27 (×2): 25 mg via ORAL
  Filled 2015-04-26 (×2): qty 1

## 2015-04-26 MED ORDER — HYDROMORPHONE HCL 2 MG PO TABS
2.0000 mg | ORAL_TABLET | ORAL | Status: DC | PRN
  Administered 2015-04-26 – 2015-04-27 (×4): 6 mg via ORAL
  Filled 2015-04-26 (×4): qty 3

## 2015-04-26 MED ORDER — ESOMEPRAZOLE MAGNESIUM 20 MG PO CPDR
20.0000 mg | DELAYED_RELEASE_CAPSULE | Freq: Every day | ORAL | Status: DC
  Administered 2015-04-26 – 2015-04-27 (×2): 20 mg via ORAL
  Filled 2015-04-26 (×2): qty 1

## 2015-04-26 NOTE — Anesthesia Postprocedure Evaluation (Signed)
Anesthesia Post Note  Patient: Angie Olson  Procedure(s) Performed: Procedure(s) (LRB): LEFT TOTAL HIP ARTHROPLASTY ANTERIOR APPROACH (Left)  Patient location during evaluation: PACU Anesthesia Type: Spinal Level of consciousness: oriented and awake and alert Pain management: pain level controlled Vital Signs Assessment: post-procedure vital signs reviewed and stable Respiratory status: spontaneous breathing, respiratory function stable and patient connected to nasal cannula oxygen Cardiovascular status: blood pressure returned to baseline and stable Postop Assessment: no headache and no backache Anesthetic complications: no    Last Vitals:  Filed Vitals:   04/26/15 0144 04/26/15 0506  BP: 144/72 127/66  Pulse: 85 91  Temp: 36.4 C 36.5 C  Resp: 18 18    Last Pain:  Filed Vitals:   04/26/15 0616  PainSc: 2                  Montez Hageman

## 2015-04-26 NOTE — Evaluation (Signed)
Physical Therapy Evaluation Patient Details Name: Angie Olson MRN: [id number] DOB: September 19, 1950 Today's Date: 04/26/2015   History of Present Illness  Pt s/p L THR with hx of TIA(13), R TKR(15), and lumbar fusion (4/16)  Clinical Impression  Pt s/p L THR presents with decreased L LE strength/ROM and post op pain limiting functional mobility.  Pt should progress to dc home with family assist and HHPT follow up.    Follow Up Recommendations Home health PT    Equipment Recommendations  None recommended by PT    Recommendations for Other Services OT consult     Precautions / Restrictions Precautions Precautions: Fall Restrictions Weight Bearing Restrictions: No Other Position/Activity Restrictions: WBAT      Mobility  Bed Mobility Overal bed mobility: Needs Assistance Bed Mobility: Supine to Sit     Supine to sit: Min assist     General bed mobility comments: cues for sequence and use of R LE to self assist.  Pt utilizing bed rail to self assist  Transfers Overall transfer level: Needs assistance Equipment used: Rolling walker (2 wheeled) Transfers: Sit to/from Stand Sit to Stand: Min guard         General transfer comment: cues for LE management and use of UEs to self assist  Ambulation/Gait Ambulation/Gait assistance: Min assist;Min guard Ambulation Distance (Feet): 150 Feet Assistive device: Rolling walker (2 wheeled) Gait Pattern/deviations: Step-to pattern;Step-through pattern;Decreased step length - right;Decreased step length - left;Shuffle;Trunk flexed Gait velocity: decr Gait velocity interpretation: Below normal speed for age/gender General Gait Details: cues for posture, position from RW and initial sequence  Stairs            Wheelchair Mobility    Modified Rankin (Stroke Patients Only)       Balance                                             Pertinent Vitals/Pain Pain Assessment: 0-10 Pain Score: 4  Pain  Location: L hip/thigh Pain Descriptors / Indicators: Aching;Sore Pain Intervention(s): Limited activity within patient's tolerance;Monitored during session;Premedicated before session;Ice applied    Home Living Family/patient expects to be discharged to:: Private residence Living Arrangements: Spouse/significant other Available Help at Discharge: Family;Available 24 hours/day Type of Home: House Home Access: Stairs to enter Entrance Stairs-Rails: Left Entrance Stairs-Number of Steps: 3 Home Layout: Multi-level;Able to live on main level with bedroom/bathroom Home Equipment: Gilford Rile - 2 wheels;Bedside commode;Cane - single point;Adaptive equipment;Shower seat      Prior Function Level of Independence: Independent with assistive device(s)         Comments: RW for mobility. bath/dress herself     Hand Dominance   Dominant Hand: Right    Extremity/Trunk Assessment   Upper Extremity Assessment: Overall WFL for tasks assessed           Lower Extremity Assessment: LLE deficits/detail   LLE Deficits / Details: Strength at hip 2+/5 with AAROM at hip to 90 flex and 15 abd.  L[employer] active [employer]     Communication   Communication: No difficulties  Cognition Arousal/Alertness: Awake/alert Behavior During Therapy: WFL for tasks assessed/performed Overall Cognitive Status: Within Functional Limits for tasks assessed                      General Comments      Exercises Total Joint Exercises Ankle Circles/Pumps: AROM;Both;15 reps;Supine  Quad Sets: AROM;Both;10 reps;Supine Heel Slides: AAROM;Both;20 reps;Supine Hip ABduction/ADduction: AAROM;Left;15 reps;Supine      Assessment/Plan    PT Assessment Patient needs continued PT services  PT Diagnosis Difficulty walking   PT Problem List Decreased strength;Decreased range of motion;Decreased activity tolerance;Decreased mobility;Decreased knowledge of use of DME;Pain  PT Treatment Interventions DME instruction;Gait  training;Stair training;Functional mobility training;Therapeutic activities;Therapeutic exercise;Patient/family education   PT Goals (Current goals can be found in the Care Plan section) Acute Rehab PT Goals Patient Stated Goal: Be able to ride my bike again PT Goal Formulation: With patient Time For Goal Achievement: 04/30/15 Potential to Achieve Goals: Good    Frequency 7X/week   Barriers to discharge        Co-evaluation               End of Session Equipment Utilized During Treatment: Gait belt Activity Tolerance: Patient tolerated treatment well Patient left: with call bell/phone within reach;in bed;with bed alarm set Nurse Communication: Mobility status         Time: [id number] PT Time Calculation (min) (ACUTE ONLY): 29 min   Charges:   PT Evaluation $PT Eval Low Complexity: 1 Procedure PT Treatments $Therapeutic Exercise: 8-22 mins   PT G Codes:        Inis Borneman May 11, 2015, 12:42 PM

## 2015-04-26 NOTE — Progress Notes (Signed)
Subjective: 1 Day Post-Op Procedure(s) (LRB): LEFT TOTAL HIP ARTHROPLASTY ANTERIOR APPROACH (Left) Patient reports pain as mild.   Patient seen in rounds by Dr. Wynelle Link. Patient is well, but has had some minor complaints of pain in the hip, requiring pain medications We will start therapy today.  Plan is to go Home after hospital stay.  Objective: Vital signs in last 24 hours: Temp:  [97.3 F (36.3 C)-98.4 F (36.9 C)] 97.7 F (36.5 C) (02/09 0506) Pulse Rate:  [80-103] 91 (02/09 0506) Resp:  [13-24] 18 (02/09 0506) BP: (116-153)/(51-95) 127/66 mmHg (02/09 0506) SpO2:  [96 %-100 %] 96 % (02/09 0506) Weight:  [85.276 kg (188 lb)-85.531 kg (188 lb 9 oz)] 85.276 kg (188 lb) (02/08 1707)  Intake/Output from previous day:  Intake/Output Summary (Last 24 hours) at 04/26/15 0724 Last data filed at 04/26/15 0615  Gross per 24 hour  Intake   3580 ml  Output   2860 ml  Net    720 ml    Intake/Output this shift: UOP 1450 since around [place]  Labs:  Recent Labs  04/26/15 0401  HGB 8.9*    Recent Labs  04/26/15 0401  WBC 12.2*  RBC 3.38*  HCT 28.1*  PLT 354    Recent Labs  04/26/15 0401  NA 140  K 3.9  CL 106  CO2 25  BUN 11  CREATININE 0.72  GLUCOSE 194*  CALCIUM 8.7*   No results for input(s): LABPT, INR in the last 72 hours.  EXAM General - Patient is Alert, Appropriate and Oriented Extremity - Neurovascular intact Sensation intact distally Dorsiflexion/Plantar flexion intact Dressing - dressing C/D/I Motor Function - intact, moving foot and toes well on exam.  Hemovac pulled without difficulty.  Past Medical History  Diagnosis Date  . Hypertension   . Acid reflux   . Bronchitis     CHRONIC   . Anxiety     PT'S SON DIED 2010-06-20  . Pneumonia     X 3  - LAST TIME WAS 06/20/2011  . H/O hiatal hernia   . Arthritis     RA AND OA  . Pain     RIGHT KNEE - TORN MENISCUS AND ACL  . Stroke ([place]) 03/2011    TIA--PT EXPERIENCED NUMBNESS RT HAND AND ARM ,  HEADACHE AND NAUSEA. NO RESIDUAL PROBLEMS  . Borderline hypercholesterolemia     on medication for TIA  . Environmental allergies   . PONV (postoperative nausea and vomiting) 09-2012    sore throat after ankle surgery 6 yrs ago and severe ponv 09-2012  . Allergic rhinitis   . GERD (gastroesophageal reflux disease)     on PPI and carafte prn  . IBS (irritable bowel syndrome)   . LBP (low back pain)   . Pulmonary nodule seen on imaging study   . OA (osteoarthritis)     Left Shoulder  . Colon polyp   . Shortness of breath     with activity,bronchitis    Assessment/Plan: 1 Day Post-Op Procedure(s) (LRB): LEFT TOTAL HIP ARTHROPLASTY ANTERIOR APPROACH (Left) Principal Problem:   OA (osteoarthritis) of hip  Estimated body mass index is 31.28 kg/(m^2) as calculated from the following:   Height as of this encounter: 5\' 5"  (1.651 m).   Weight as of this encounter: 85.276 kg (188 lb). Advance diet Up with therapy Plan for discharge tomorrow Discharge home with home health  DVT Prophylaxis - Xarelto Weight Bearing As Tolerated left Leg Hemovac Pulled Begin Therapy  [name], PA-C Orthopaedic Surgery 04/26/2015, 7:24 AM

## 2015-04-26 NOTE — Care Management Note (Signed)
Case Management Note  Patient Details  Name: RUSHIE BRAZEL MRN: [id number] Date of Birth: 01-15-1951  Subjective/Objective:                  LEFT TOTAL HIP ARTHROPLASTY ANTERIOR APPROACH (Left) Action/Plan: Discharge planning Expected Discharge Date:  04/26/15               Expected Discharge Plan:  [place]  In-House Referral:     Discharge planning Services  CM Consult  Post Acute Care Choice:  Home Health Choice offered to:  Patient  DME Arranged:  N/A DME Agency:  NA  HH Arranged:  PT [place] Agency:  [facility]  Status of Service:  Completed, signed off  Medicare Important Message Given:    Date Medicare IM Given:    Medicare IM give by:    Date Additional Medicare IM Given:    Additional Medicare Important Message give by:     If discussed at [place] of Stay Meetings, dates discussed:    Additional Comments: CM met with pt in room to offer choice of home health agency.  Pt chooses [facility] .  Referral called to [facility] rep, Santiago Glad.  Pt has both a rolling walker and 3n1 from previous surgery.  No other CM needs were comunicated. Dellie Catholic, RN 04/26/2015, 3:22 PM

## 2015-04-26 NOTE — Progress Notes (Signed)
OT Cancellation Note  Patient Details Name: Angie Olson MRN: [id number] DOB: Nov 17, 1950   Cancelled Treatment:    Reason Eval/Treat Not Completed: Other (comment).  Pt has pain and fatique; did not sleep much last night. Will check back later today or tomorrow am.  [facility] 04/26/2015, 12:31 PM  Lesle Chris, OTR/L [phone] 04/26/2015

## 2015-04-26 NOTE — Progress Notes (Signed)
Patient reported uncontrolled pain with medications. Notified physician assistant regarding this issue. Pain medication dosage increased.

## 2015-04-26 NOTE — Discharge Instructions (Addendum)
° °Dr. Frank Aluisio °Total Joint Specialist °[facility] °[address] °[place], [place] [address] °[phone] ° °ANTERIOR APPROACH TOTAL HIP REPLACEMENT POSTOPERATIVE DIRECTIONS ° ° °Hip Rehabilitation, Guidelines Following Surgery  °The results of a hip operation are greatly improved after range of motion and muscle strengthening exercises. Follow all safety measures which are given to protect your hip. If any of these exercises cause increased pain or swelling in your joint, decrease the amount until you are comfortable again. Then slowly increase the exercises. Call your caregiver if you have problems or questions.  ° °HOME CARE INSTRUCTIONS  °Remove items at home which could result in a fall. This includes throw rugs or furniture in walking pathways.  °· ICE to the affected hip every three hours for 30 minutes at a time and then as needed for pain and swelling.  Continue to use ice on the hip for pain and swelling from surgery. You may notice swelling that will progress down to the foot and ankle.  This is normal after surgery.  Elevate the leg when you are not up walking on it.   °· Continue to use the breathing machine which will help keep your temperature down.  It is common for your temperature to cycle up and down following surgery, especially at night when you are not up moving around and exerting yourself.  The breathing machine keeps your lungs expanded and your temperature down. ° ° °DIET °You may resume your previous home diet once your are discharged from the hospital. ° °DRESSING / WOUND CARE / SHOWERING °You may shower 3 days after surgery, but keep the wounds dry during showering.  You may use an occlusive plastic wrap (Press'n Seal for example), NO SOAKING/SUBMERGING IN THE BATHTUB.  If the bandage gets wet, change with a clean dry gauze.  If the incision gets wet, pat the wound dry with a clean towel. °You may start showering once you are discharged home but do not  submerge the incision under water. Just pat the incision dry and apply a dry gauze dressing on daily. °Change the surgical dressing daily and reapply a dry dressing each time. ° °ACTIVITY °Walk with your walker as instructed. °Use walker as long as suggested by your caregivers. °Avoid periods of inactivity such as sitting longer than an hour when not asleep. This helps prevent blood clots.  °You may resume a sexual relationship in one month or when given the OK by your doctor.  °You may return to work once you are cleared by your doctor.  °Do not drive a car for 6 weeks or until released by you surgeon.  °Do not drive while taking narcotics. ° °WEIGHT BEARING °Weight bearing as tolerated with assist device (walker, cane, etc) as directed, use it as long as suggested by your surgeon or therapist, typically at least 4-6 weeks. ° °POSTOPERATIVE CONSTIPATION PROTOCOL °Constipation - defined medically as fewer than three stools per week and severe constipation as less than one stool per week. ° °One of the most common issues patients have following surgery is constipation.  Even if you have a regular bowel pattern at home, your normal regimen is likely to be disrupted due to multiple reasons following surgery.  Combination of anesthesia, postoperative narcotics, change in appetite and fluid intake all can affect your bowels.  In order to avoid complications following surgery, here are some recommendations in order to help you during your recovery period. ° °Colace (docusate) - Pick up an over-the-counter   form of Colace or another stool softener and take twice a day as long as you are requiring postoperative pain medications.  Take with a full glass of water daily.  If you experience loose stools or diarrhea, hold the colace until you stool forms back up.  If your symptoms do not get better within 1 week or if they get worse, check with your doctor. ° °Dulcolax (bisacodyl) - Pick up over-the-counter and take as directed  by the product packaging as needed to assist with the movement of your bowels.  Take with a full glass of water.  Use this product as needed if not relieved by Colace only.  ° °MiraLax (polyethylene glycol) - Pick up over-the-counter to have on hand.  MiraLax is a solution that will increase the amount of water in your bowels to assist with bowel movements.  Take as directed and can mix with a glass of water, juice, soda, coffee, or tea.  Take if you go more than two days without a movement. °Do not use MiraLax more than once per day. Call your doctor if you are still constipated or irregular after using this medication for 7 days in a row. ° °If you continue to have problems with postoperative constipation, please contact the office for further assistance and recommendations.  If you experience "the worst abdominal pain ever" or develop nausea or vomiting, please contact the office immediatly for further recommendations for treatment. ° °ITCHING ° If you experience itching with your medications, try taking only a single pain pill, or even half a pain pill at a time.  You can also use Benadryl over the counter for itching or also to help with sleep.  ° °TED HOSE STOCKINGS °Wear the elastic stockings on both legs for three weeks following surgery during the day but you may remove then at night for sleeping. ° °MEDICATIONS °See your medication summary on the “After Visit Summary” that the nursing staff will review with you prior to discharge.  You may have some home medications which will be placed on hold until you complete the course of blood thinner medication.  It is important for you to complete the blood thinner medication as prescribed by your surgeon.  Continue your approved medications as instructed at time of discharge. ° °PRECAUTIONS °If you experience chest pain or shortness of breath - call 911 immediately for transfer to the hospital emergency department.  °If you develop a fever greater that 101 F,  purulent drainage from wound, increased redness or drainage from wound, foul odor from the wound/dressing, or calf pain - CONTACT YOUR SURGEON.   °                                                °FOLLOW-UP APPOINTMENTS °Make sure you keep all of your appointments after your operation with your surgeon and caregivers. You should call the office at the above phone number and make an appointment for approximately two weeks after the date of your surgery or on the date instructed by your surgeon outlined in the "After Visit Summary". ° °RANGE OF MOTION AND STRENGTHENING EXERCISES  °These exercises are designed to help you keep full movement of your hip joint. Follow your caregiver's or physical therapist's instructions. Perform all exercises about fifteen times, three times per day or as directed. Exercise both hips, even if you   have had only one joint replacement. These exercises can be done on a training (exercise) mat, on the floor, on a table or on a bed. Use whatever works the best and is most comfortable for you. Use music or television while you are exercising so that the exercises are a pleasant break in your day. This will make your life better with the exercises acting as a break in routine you can look forward to.  Lying on your back, slowly slide your foot toward your buttocks, raising your knee up off the floor. Then slowly slide your foot back down until your leg is straight again.  Lying on your back spread your legs as far apart as you can without causing discomfort.  Lying on your side, raise your upper leg and foot straight up from the floor as far as is comfortable. Slowly lower the leg and repeat.  Lying on your back, tighten up the muscle in the front of your thigh (quadriceps muscles). You can do this by keeping your leg straight and trying to raise your heel off the floor. This helps strengthen the largest muscle supporting your knee.  Lying on your back, tighten up the muscles of your  buttocks both with the legs straight and with the knee bent at a comfortable angle while keeping your heel on the floor.   IF YOU ARE TRANSFERRED TO A SKILLED REHAB FACILITY If the patient is transferred to a skilled rehab facility following release from the hospital, a list of the current medications will be sent to the facility for the patient to continue.  When discharged from the skilled rehab facility, please have the facility set up the patient's [place] prior to being released. Also, the skilled facility will be responsible for providing the patient with their medications at time of release from the facility to include their pain medication, the muscle relaxants, and their blood thinner medication. If the patient is still at the rehab facility at time of the two week follow up appointment, the skilled rehab facility will also need to assist the patient in arranging follow up appointment in our office and any transportation needs.  MAKE SURE YOU:  Understand these instructions.  Get help right away if you are not doing well or get worse.    Pick up stool softner and laxative for home use following surgery while on pain medications. Do not submerge incision under water. Please use good hand washing techniques while changing dressing each day. May shower starting three days after surgery. Please use a clean towel to pat the incision dry following showers. Continue to use ice for pain and swelling after surgery. Do not use any lotions or creams on the incision until instructed by your surgeon.  Take Xarelto for two and a half more weeks, then discontinue Xarelto. Once the patient has completed the blood thinner regimen, then take a Baby 81 mg Aspirin daily for three more weeks.   Information on my medicine - XARELTO (Rivaroxaban)  This medication education was reviewed with me or my healthcare representative as part of my discharge preparation.  The pharmacist that  spoke with me during my hospital stay was:  Angela Adam [facility]  Why was Xarelto prescribed for you? Xarelto was prescribed for you to reduce the risk of blood clots forming after orthopedic surgery. The medical term for these abnormal blood clots is venous thromboembolism (VTE).  What do you need to know about xarelto ? Take your Xarelto  ONCE DAILY at the same time every day. You may take it either with or without food.  If you have difficulty swallowing the tablet whole, you may crush it and mix in applesauce just prior to taking your dose.  Take Xarelto exactly as prescribed by your doctor and DO NOT stop taking Xarelto without talking to the doctor who prescribed the medication.  Stopping without other VTE prevention medication to take the place of Xarelto may increase your risk of developing a clot.  After discharge, you should have regular check-up appointments with your healthcare provider that is prescribing your Xarelto.    What do you do if you miss a dose? If you miss a dose, take it as soon as you remember on the same day then continue your regularly scheduled once daily regimen the next day. Do not take two doses of Xarelto on the same day.   Important Safety Information A possible side effect of Xarelto is bleeding. You should call your healthcare provider right away if you experience any of the following: ? Bleeding from an injury or your nose that does not stop. ? Unusual colored urine (red or dark brown) or unusual colored stools (red or black). ? Unusual bruising for unknown reasons. ? A serious fall or if you hit your head (even if there is no bleeding).  Some medicines may interact with Xarelto and might increase your risk of bleeding while on Xarelto. To help avoid this, consult your healthcare provider or pharmacist prior to using any new prescription or non-prescription medications, including herbals, vitamins, non-steroidal anti-inflammatory drugs  (NSAIDs) and supplements.  This website has more information on Xarelto: [email or web address]

## 2015-04-27 LAB — CBC
HCT: 26.4 % — ABNORMAL LOW (ref 36.0–46.0)
Hemoglobin: 8.5 g/dL — ABNORMAL LOW (ref 12.0–15.0)
MCH: 26.6 pg (ref 26.0–34.0)
MCHC: 32.2 g/dL (ref 30.0–36.0)
MCV: 82.5 fL (ref 78.0–100.0)
Platelets: 362 10*3/uL (ref 150–400)
RBC: 3.2 MIL/uL — ABNORMAL LOW (ref 3.87–5.11)
RDW: 16 % — AB (ref 11.5–15.5)
WBC: 16.1 10*3/uL — ABNORMAL HIGH (ref 4.0–10.5)

## 2015-04-27 LAB — BASIC METABOLIC PANEL
Anion gap: 10 (ref 5–15)
BUN: 12 mg/dL (ref 6–20)
CALCIUM: 8.7 mg/dL — AB (ref 8.9–10.3)
CO2: 26 mmol/L (ref 22–32)
CREATININE: 0.63 mg/dL (ref 0.44–1.00)
Chloride: 106 mmol/L (ref 101–111)
GFR calc non Af Amer: >60 mL/min (ref >60)
Glucose, Bld: 141 mg/dL — ABNORMAL HIGH (ref 65–99)
Potassium: 3.7 mmol/L (ref 3.5–5.1)
SODIUM: 142 mmol/L (ref 135–145)

## 2015-04-27 MED ORDER — TRAMADOL HCL 50 MG PO TABS: 50.0000 mg | ORAL_TABLET | Freq: Four times a day (QID) | ORAL | Status: DC | PRN

## 2015-04-27 MED ORDER — NALOXEGOL OXALATE 25 MG PO TABS: 25.0000 mg | ORAL_TABLET | Freq: Every day | ORAL | Status: DC

## 2015-04-27 MED ORDER — METHOCARBAMOL 500 MG PO TABS: 500.0000 mg | ORAL_TABLET | Freq: Four times a day (QID) | ORAL | Status: DC | PRN

## 2015-04-27 MED ORDER — HYDROMORPHONE HCL 2 MG PO TABS: 2.0000 mg | ORAL_TABLET | ORAL | Status: DC | PRN

## 2015-04-27 MED ORDER — POLYSACCHARIDE IRON COMPLEX 150 MG PO CAPS
150.0000 mg | ORAL_CAPSULE | Freq: Two times a day (BID) | ORAL | Status: DC
  Administered 2015-04-27: 150 mg via ORAL
  Filled 2015-04-27 (×2): qty 1

## 2015-04-27 MED ORDER — RIVAROXABAN 10 MG PO TABS: 10.0000 mg | ORAL_TABLET | Freq: Every day | ORAL | Status: DC

## 2015-04-27 MED ORDER — POLYSACCHARIDE IRON COMPLEX 150 MG PO CAPS: 150.0000 mg | ORAL_CAPSULE | Freq: Two times a day (BID) | ORAL | Status: DC

## 2015-04-27 NOTE — Progress Notes (Signed)
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  Reviewed therex, stairs and car transfers  Follow Up Recommendations  Home health PT     Equipment Recommendations  None recommended by PT    Recommendations for Other Services OT consult     Precautions / Restrictions Precautions Precautions: Fall Restrictions Weight Bearing Restrictions: No Other Position/Activity Restrictions: WBAT    Mobility  Bed Mobility   Bed Mobility: Supine to Sit     Supine to sit: Supervision     General bed mobility comments: NT - Pt OOB and declines back to bed  Transfers Overall transfer level: Needs assistance Equipment used: Rolling walker (2 wheeled) Transfers: Sit to/from Stand Sit to Stand: Supervision         General transfer comment: cues for UE/LE placement  Ambulation/Gait Ambulation/Gait assistance: Min guard;Supervision Ambulation Distance (Feet): 75 Feet Assistive device: Rolling walker (2 wheeled) Gait Pattern/deviations: Step-to pattern;Step-through pattern;Decreased step length - right;Decreased step length - left;Shuffle;Trunk flexed Gait velocity: decr   General Gait Details: min cues for posture, position from RW and initial sequence   Stairs Stairs: Yes Stairs assistance: Min assist Stair Management: One rail Left;Step to pattern;Forwards;With cane Number of Stairs: 5 General stair comments: cues for sequence and foot/cane placement  Wheelchair Mobility    Modified Rankin (Stroke Patients Only)       Balance                                    Cognition Arousal/Alertness: Awake/alert Behavior During Therapy: WFL for tasks assessed/performed Overall Cognitive Status: Within  Functional Limits for tasks assessed                      Exercises Total Joint Exercises Ankle Circles/Pumps: AROM;Both;15 reps;Supine Quad Sets: AROM;Both;10 reps;Supine Heel Slides: AAROM;Both;20 reps;Supine Hip ABduction/ADduction: AAROM;Left;15 reps;Supine    General Comments        Pertinent Vitals/Pain Pain Assessment: 0-10 Pain Score: 4  Pain Location: L hip/thigh Pain Descriptors / Indicators: Aching;Sore Pain Intervention(s): Limited activity within patient's tolerance;Monitored during session;Premedicated before session;Ice applied    Home Living Family/patient expects to be discharged to:: Private residence Living Arrangements: Spouse/significant other           Home Equipment: Environmental consultant - 2 wheels;Bedside commode;Cane - single point;Adaptive equipment;Shower seat      Prior Function Level of Independence: Independent with assistive device(s)      Comments: RW for mobility. bath/dress herself   PT Goals (current goals can now be found in the care plan section) Acute Rehab PT Goals Patient Stated Goal: Be able to ride my bike again PT Goal Formulation: With patient Time For Goal Achievement: 04/30/15 Potential to Achieve Goals: Good Progress towards PT goals: Progressing toward goals    Frequency  7X/week    PT Plan Current plan remains appropriate    Co-evaluation             End of Session Equipment Utilized During Treatment: Gait belt Activity Tolerance: Patient tolerated treatment well Patient left: with call bell/phone within reach;in chair     Time: [id number] PT Time Calculation (min) (ACUTE ONLY):  29 min  Charges:  $Gait Training: 8-22 mins $Therapeutic Exercise: 8-22 mins                    G Codes:      Chayah Mckee May 24, 2015, 12:29 PM

## 2015-04-27 NOTE — Discharge Summary (Signed)
Physician Discharge Summary   Patient ID: Angie Olson MRN: [id number] DOB/AGE: 1950-05-29 65 y.o.  Admit date: 04/25/2015 Discharge date: 04-27-2015  Primary Diagnosis:  Osteoarthritis of the Left hip.   Admission Diagnoses:  Past Medical History  Diagnosis Date  . Hypertension   . Acid reflux   . Bronchitis     CHRONIC   . Anxiety     PT'S SON DIED 11-May-2010  . Pneumonia     X 3  - LAST TIME WAS 05-12-2011  . H/O hiatal hernia   . Arthritis     RA AND OA  . Pain     RIGHT KNEE - TORN MENISCUS AND ACL  . Stroke ([place]) 03/2011    TIA--PT EXPERIENCED NUMBNESS RT HAND AND ARM , HEADACHE AND NAUSEA. NO RESIDUAL PROBLEMS  . Borderline hypercholesterolemia     on medication for TIA  . Environmental allergies   . PONV (postoperative nausea and vomiting) 09-2012    sore throat after ankle surgery 6 yrs ago and severe ponv 09-2012  . Allergic rhinitis   . GERD (gastroesophageal reflux disease)     on PPI and carafte prn  . IBS (irritable bowel syndrome)   . LBP (low back pain)   . Pulmonary nodule seen on imaging study   . OA (osteoarthritis)     Left Shoulder  . Colon polyp   . Shortness of breath     with activity,bronchitis   Discharge Diagnoses:   Principal Problem:   OA (osteoarthritis) of hip  Estimated body mass index is 31.28 kg/(m^2) as calculated from the following:   Height as of this encounter: _0  (1.651 m).   Weight as of this encounter: 85.276 kg (188 lb).  Procedure(s) (LRB): LEFT TOTAL HIP ARTHROPLASTY ANTERIOR APPROACH (Left)   Consults: None  HPI: Angie Olson is a 65 y.o. female who has advanced end-  stage arthritis of her Left hip with progressively worsening pain and  dysfunction.The patient has failed nonoperative management and presents for  total hip arthroplasty.   Laboratory Data: Admission on 04/25/2015  Component Date Value Ref Range Status  . WBC 04/26/2015 12.2* 4.0 - 10.5 K/uL Final  . RBC 04/26/2015 3.38* 3.87 - 5.11 MIL/uL  Final  . Hemoglobin 04/26/2015 8.9* 12.0 - 15.0 g/dL Final  . HCT 04/26/2015 28.1* 36.0 - 46.0 % Final  . MCV 04/26/2015 83.1  78.0 - 100.0 fL Final  . MCH 04/26/2015 26.3  26.0 - 34.0 pg Final  . MCHC 04/26/2015 31.7  30.0 - 36.0 g/dL Final  . RDW 04/26/2015 15.7* 11.5 - 15.5 % Final  . Platelets 04/26/2015 354  150 - 400 K/uL Final  . Sodium 04/26/2015 140  135 - 145 mmol/L Final  . Potassium 04/26/2015 3.9  3.5 - 5.1 mmol/L Final  . Chloride 04/26/2015 106  101 - 111 mmol/L Final  . CO2 04/26/2015 25  22 - 32 mmol/L Final  . Glucose, Bld 04/26/2015 194* 65 - 99 mg/dL Final  . BUN 04/26/2015 11  6 - 20 mg/dL Final  . Creatinine, Ser 04/26/2015 0.72  0.44 - 1.00 mg/dL Final  . Calcium 04/26/2015 8.7* 8.9 - 10.3 mg/dL Final  . GFR calc non Af Amer 04/26/2015 >60  >60 mL/min Final  . GFR calc Af Amer 04/26/2015 >60  >60 mL/min Final   Comment: (NOTE) The eGFR has been calculated using the CKD EPI equation. This calculation has not been validated in all clinical situations. eGFR's persistently <60  mL/min signify possible Chronic Kidney Disease.   . Anion gap 04/26/2015 9  5 - 15 Final  . WBC 04/27/2015 16.1* 4.0 - 10.5 K/uL Final  . RBC 04/27/2015 3.20* 3.87 - 5.11 MIL/uL Final  . Hemoglobin 04/27/2015 8.5* 12.0 - 15.0 g/dL Final  . HCT 04/27/2015 26.4* 36.0 - 46.0 % Final  . MCV 04/27/2015 82.5  78.0 - 100.0 fL Final  . MCH 04/27/2015 26.6  26.0 - 34.0 pg Final  . MCHC 04/27/2015 32.2  30.0 - 36.0 g/dL Final  . RDW 04/27/2015 16.0* 11.5 - 15.5 % Final  . Platelets 04/27/2015 362  150 - 400 K/uL Final  . Sodium 04/27/2015 142  135 - 145 mmol/L Final  . Potassium 04/27/2015 3.7  3.5 - 5.1 mmol/L Final  . Chloride 04/27/2015 106  101 - 111 mmol/L Final  . CO2 04/27/2015 26  22 - 32 mmol/L Final  . Glucose, Bld 04/27/2015 141* 65 - 99 mg/dL Final  . BUN 04/27/2015 12  6 - 20 mg/dL Final  . Creatinine, Ser 04/27/2015 0.63  0.44 - 1.00 mg/dL Final  . Calcium 04/27/2015 8.7* 8.9 -  10.3 mg/dL Final  . GFR calc non Af Amer 04/27/2015 >60  >60 mL/min Final  . GFR calc Af Amer 04/27/2015 >60  >60 mL/min Final   Comment: (NOTE) The eGFR has been calculated using the CKD EPI equation. This calculation has not been validated in all clinical situations. eGFR's persistently <60 mL/min signify possible Chronic Kidney Disease.   Georgiann Hahn gap 04/27/2015 10  5 - 15 Final  Hospital Outpatient Visit on 04/19/2015  Component Date Value Ref Range Status  . MRSA, PCR 04/19/2015 NEGATIVE  NEGATIVE Final  . Staphylococcus aureus 04/19/2015 NEGATIVE  NEGATIVE Final   Comment:        The Xpert SA Assay (FDA approved for NASAL specimens in patients over 25 years of age), is one component of a comprehensive surveillance program.  Test performance has been validated by [facility] for patients greater than or equal to 62 year old. It is not intended to diagnose infection nor to guide or monitor treatment.   Marland Kitchen aPTT 04/19/2015 28  24 - 37 seconds Final  . WBC 04/19/2015 10.4  4.0 - 10.5 K/uL Final  . RBC 04/19/2015 4.49  3.87 - 5.11 MIL/uL Final  . Hemoglobin 04/19/2015 11.8* 12.0 - 15.0 g/dL Final  . HCT 04/19/2015 37.6  36.0 - 46.0 % Final  . MCV 04/19/2015 83.7  78.0 - 100.0 fL Final  . MCH 04/19/2015 26.3  26.0 - 34.0 pg Final  . MCHC 04/19/2015 31.4  30.0 - 36.0 g/dL Final  . RDW 04/19/2015 15.8* 11.5 - 15.5 % Final  . Platelets 04/19/2015 396  150 - 400 K/uL Final  . Sodium 04/19/2015 142  135 - 145 mmol/L Final  . Potassium 04/19/2015 4.0  3.5 - 5.1 mmol/L Final  . Chloride 04/19/2015 106  101 - 111 mmol/L Final  . CO2 04/19/2015 28  22 - 32 mmol/L Final  . Glucose, Bld 04/19/2015 118* 65 - 99 mg/dL Final  . BUN 04/19/2015 17  6 - 20 mg/dL Final  . Creatinine, Ser 04/19/2015 1.04* 0.44 - 1.00 mg/dL Final  . Calcium 04/19/2015 8.9  8.9 - 10.3 mg/dL Final  . Total Protein 04/19/2015 6.8  6.5 - 8.1 g/dL Final  . Albumin 04/19/2015 4.0  3.5 - 5.0 g/dL Final  . AST  04/19/2015 16  15 - 41 U/L Final  .  ALT 04/19/2015 16  14 - 54 U/L Final  . Alkaline Phosphatase 04/19/2015 68  38 - 126 U/L Final  . Total Bilirubin 04/19/2015 0.4  0.3 - 1.2 mg/dL Final  . GFR calc non Af Amer 04/19/2015 56* >60 mL/min Final  . GFR calc Af Amer 04/19/2015 >60  >60 mL/min Final   Comment: (NOTE) The eGFR has been calculated using the CKD EPI equation. This calculation has not been validated in all clinical situations. eGFR's persistently <60 mL/min signify possible Chronic Kidney Disease.   . Anion gap 04/19/2015 8  5 - 15 Final  . Prothrombin Time 04/19/2015 13.6  11.6 - 15.2 seconds Final  . INR 04/19/2015 1.02  0.00 - 1.49 Final  . ABO/RH(D) 04/19/2015 A NEG   Final  . Antibody Screen 04/19/2015 NEG   Final  . Sample Expiration 04/19/2015 04/28/2015   Final  . Extend sample reason 04/19/2015 NO TRANSFUSIONS OR PREGNANCY IN THE PAST 3 MONTHS   Final  . Color, Urine 04/19/2015 YELLOW  YELLOW Final  . APPearance 04/19/2015 TURBID* CLEAR Final  . Specific Gravity, Urine 04/19/2015 1.030  1.005 - 1.030 Final  . pH 04/19/2015 5.5  5.0 - 8.0 Final  . Glucose, UA 04/19/2015 NEGATIVE  NEGATIVE mg/dL Final  . Hgb urine dipstick 04/19/2015 LARGE* NEGATIVE Final  . Bilirubin Urine 04/19/2015 SMALL* NEGATIVE Final  . Ketones, ur 04/19/2015 NEGATIVE  NEGATIVE mg/dL Final  . Protein, ur 04/19/2015 30* NEGATIVE mg/dL Final  . Nitrite 04/19/2015 NEGATIVE  NEGATIVE Final  . Leukocytes, UA 04/19/2015 NEGATIVE  NEGATIVE Final  . Squamous Epithelial / LPF 04/19/2015 0-5* NONE SEEN Final  . WBC, UA 04/19/2015 0-5  0 - 5 WBC/hpf Final  . RBC / HPF 04/19/2015 6-30  0 - 5 RBC/hpf Final  . Bacteria, UA 04/19/2015 MANY* NONE SEEN Final  . Casts 04/19/2015 HYALINE CASTS* NEGATIVE Final  . Urine-Other 04/19/2015 MUCOUS PRESENT   Final   AMORPHOUS URATES/PHOSPHATES     X-Rays:Dg Pelvis Portable  04/25/2015  CLINICAL DATA:  Status post left hip replacement EXAM: PORTABLE PELVIS 1-2 VIEWS  COMPARISON:  None. FINDINGS: A left hip replacement is seen. No acute bony abnormality is noted. No soft tissue changes are seen aside from air in the surgical bed. A surgical drain is noted in place. IMPRESSION: Status post left hip replacement. Electronically Signed   By: Inez Catalina M.D.   On: 04/25/2015 16:02   Dg C-arm 1-60 Min-no Report  04/25/2015  CLINICAL DATA: Surgery C-ARM 1-60 MINUTES Fluoroscopy was utilized by the requesting physician.  No radiographic interpretation.    EKG: Orders placed or performed in visit on 01/29/15  . EKG 12-Lead     Hospital Course: Patient was admitted to [facility] and taken to the OR and underwent the above state procedure without complications.  Patient tolerated the procedure well and was later transferred to the recovery room and then to the orthopaedic floor for postoperative care.  They were given PO and IV analgesics for pain control following their surgery.  They were given 24 hours of postoperative antibiotics of  Anti-infectives    Start     Dose/Rate Route Frequency Ordered Stop   04/25/15 2000  ceFAZolin (ANCEF) IVPB 2 g/50 mL premix     2 g 100 mL/hr over 30 Minutes Intravenous Every 6 hours 04/25/15 1625 04/26/15 0147   04/25/15 1155  ceFAZolin (ANCEF) IVPB 2 g/50 mL premix     2 g 100 mL/hr over 30  Minutes Intravenous On call to O.R. 04/25/15 1155 04/25/15 1346     and started on DVT prophylaxis in the form of Xarelto.   PT and OT were ordered for total hip protocol.  The patient was allowed to be WBAT with therapy. Discharge planning was consulted to help with postop disposition and equipment needs.  Patient had a good night on the evening of surgery.  They started to get up OOB with therapy on day one.  Hemovac drain was pulled without difficulty.  Continued to work with therapy into day two.  Dressing was changed on day two and the incision was healing well. Patient was seen in rounds by Dr. Wynelle Link and was ready to go  home.  Discharge home with home health Diet - Cardiac diet Follow up - in 2 weeks on Tuesday 2/21 Activity - WBAT Disposition - Home Condition Upon Discharge - Good D/C Meds - See DC Summary DVT Prophylaxis - Xarelto  Discharge Instructions    Call MD / Call 911    Complete by:  As directed   If you experience chest pain or shortness of breath, CALL 911 and be transported to the hospital emergency room.  If you develope a fever above 101 F, pus (white drainage) or increased drainage or redness at the wound, or calf pain, call your surgeon's office.     Change dressing    Complete by:  As directed   You may change your dressing dressing daily with sterile 4 x 4 inch gauze dressing and paper tape.  Do not submerge the incision under water.     Constipation Prevention    Complete by:  As directed   Drink plenty of fluids.  Prune juice may be helpful.  You may use a stool softener, such as Colace (over the counter) 100 mg twice a day.  Use MiraLax (over the counter) for constipation as needed.     Diet - low sodium heart healthy    Complete by:  As directed      Discharge instructions    Complete by:  As directed   Pick up stool softner and laxative for home use following surgery while on pain medications. Do not submerge incision under water. Please use good hand washing techniques while changing dressing each day. May shower starting three days after surgery. Please use a clean towel to pat the incision dry following showers. Continue to use ice for pain and swelling after surgery. Do not use any lotions or creams on the incision until instructed by your surgeon.   Total Hip Protocol.  Take Xarelto for two and a half more weeks, then discontinue Xarelto. Once the patient has completed the blood thinner regimen, then take a Baby 81 mg Aspirin daily for three more weeks.  Postoperative Constipation Protocol  Constipation - defined medically as fewer than three stools per week and  severe constipation as less than one stool per week.  One of the most common issues patients have following surgery is constipation.  Even if you have a regular bowel pattern at home, your normal regimen is likely to be disrupted due to multiple reasons following surgery.  Combination of anesthesia, postoperative narcotics, change in appetite and fluid intake all can affect your bowels.  In order to avoid complications following surgery, here are some recommendations in order to help you during your recovery period.  Colace (docusate) - Pick up an over-the-counter form of Colace or another stool softener and take twice a day  as long as you are requiring postoperative pain medications.  Take with a full glass of water daily.  If you experience loose stools or diarrhea, hold the colace until you stool forms back up.  If your symptoms do not get better within 1 week or if they get worse, check with your doctor.  Dulcolax (bisacodyl) - Pick up over-the-counter and take as directed by the product packaging as needed to assist with the movement of your bowels.  Take with a full glass of water.  Use this product as needed if not relieved by Colace only.   MiraLax (polyethylene glycol) - Pick up over-the-counter to have on hand.  MiraLax is a solution that will increase the amount of water in your bowels to assist with bowel movements.  Take as directed and can mix with a glass of water, juice, soda, coffee, or tea.  Take if you go more than two days without a movement. Do not use MiraLax more than once per day. Call your doctor if you are still constipated or irregular after using this medication for 7 days in a row.  If you continue to have problems with postoperative constipation, please contact the office for further assistance and recommendations.  If you experience "the worst abdominal pain ever" or develop nausea or vomiting, please contact the office immediatly for further recommendations for treatment.      Do not sit on low chairs, stoools or toilet seats, as it may be difficult to get up from low surfaces    Complete by:  As directed      Driving restrictions    Complete by:  As directed   No driving until released by the physician.     Follow the hip precautions as taught in Physical Therapy    Complete by:  As directed      Increase activity slowly as tolerated    Complete by:  As directed      Lifting restrictions    Complete by:  As directed   No lifting until released by the physician.     Patient may shower    Complete by:  As directed   You may shower without a dressing once there is no drainage.  Do not wash over the wound.  If drainage remains, do not shower until drainage stops.     TED hose    Complete by:  As directed   Use stockings (TED hose) for 3 weeks on both leg(s).  You may remove them at night for sleeping.     Weight bearing as tolerated    Complete by:  As directed   Laterality:  left  Extremity:  Lower            Medication List    STOP taking these medications        cyclobenzaprine 10 MG tablet  Commonly known as:  FLEXERIL      TAKE these medications        ALPRAZolam 1 MG tablet  Commonly known as:  XANAX  Take 1 mg by mouth at bedtime.     amLODipine 5 MG tablet  Commonly known as:  NORVASC  Take 1 tablet by mouth daily.     atorvastatin 20 MG tablet  Commonly known as:  LIPITOR  Take 20 mg by mouth daily at 6 (six) AM.     cetirizine 10 MG tablet  Commonly known as:  ZYRTEC  Take 10 mg by mouth daily.  esomeprazole 20 MG capsule  Commonly known as:  NEXIUM  Take 20 mg by mouth daily.     fluticasone 50 MCG/ACT nasal spray  Commonly known as:  FLONASE  Place 2 sprays into both nostrils 2 (two) times daily.     HYDROmorphone 2 MG tablet  Commonly known as:  DILAUDID  Take 1-3 tablets (2-6 mg total) by mouth every 3 (three) hours as needed for moderate pain or severe pain.     iron polysaccharides 150 MG capsule   Commonly known as:  NIFEREX  Take 1 capsule (150 mg total) by mouth 2 (two) times daily. Take twice a day for three weeks and then discontinue     leflunomide 20 MG tablet  Commonly known as:  ARAVA  Take 20 mg by mouth daily.     lisinopril 40 MG tablet  Commonly known as:  PRINIVIL,ZESTRIL  Take 40 mg by mouth daily.     methocarbamol 500 MG tablet  Commonly known as:  ROBAXIN  Take 1 tablet (500 mg total) by mouth every 6 (six) hours as needed for muscle spasms.     naloxegol oxalate 25 MG Tabs tablet  Commonly known as:  MOVANTIK  Take 1 tablet (25 mg total) by mouth daily.     nortriptyline 25 MG capsule  Commonly known as:  PAMELOR  Take 25 mg by mouth at bedtime.     Oxycodone HCl 10 MG Tabs  Take 10 mg by mouth every 4 (four) hours as needed (pain).     predniSONE 5 MG tablet  Commonly known as:  DELTASONE  Take 5 mg by mouth daily. Continuous for rheumatoid arthritis     promethazine 25 MG tablet  Commonly known as:  PHENERGAN  Take 1 tablet (25 mg total) by mouth every 4 (four) hours as needed for nausea or vomiting.     rivaroxaban 10 MG Tabs tablet  Commonly known as:  XARELTO  Take 1 tablet (10 mg total) by mouth daily with breakfast. Take Xarelto for two and a half more weeks, then discontinue Xarelto. Once the patient has completed the blood thinner regimen, then take a Baby 81 mg Aspirin daily for three more weeks.     sucralfate 1 g tablet  Commonly known as:  CARAFATE  Take 1 g by mouth 2 (two) times daily as needed (indigestion).     traMADol 50 MG tablet  Commonly known as:  ULTRAM  Take 1-2 tablets (50-100 mg total) by mouth every 6 (six) hours as needed (mild pain).           Follow-up Information    Follow up with [facility].   Why:  Nicki Reaper has been requested as your physical therapist   Contact information:   [address] [place] [address] [phone]       Follow up with Gearlean Alf, MD. Schedule  an appointment as soon as possible for a visit on 05/08/2015.   Specialty:  Orthopedic Surgery   Why:  Call office at [phone] to setup appointment on Tuesday 05/08/2015 with Dr. Wynelle Link.   Contact information:   [address] [place] [address] [phone]       Signed: [name], PA-C Orthopaedic Surgery 04/27/2015, 8:11 AM

## 2015-04-27 NOTE — Evaluation (Signed)
Occupational Therapy Evaluation Patient Details Name: Angie Olson MRN: [id number] DOB: [birth year] Today's Date: 04/27/2015    History of Present Illness Pt s/p L THR with hx of TIA(13), R TKR(15), and lumbar fusion (4/16)   Clinical Impression   Pt was admitted for the above surgery.  All education was completed.  No further OT is needed at this time    Follow Up Recommendations  No OT follow up    Equipment Recommendations  None recommended by OT    Recommendations for Other Services       Precautions / Restrictions Precautions Precautions: Fall Restrictions Weight Bearing Restrictions: No Other Position/Activity Restrictions: WBAT      Mobility Bed Mobility   Bed Mobility: Supine to Sit     Supine to sit: Supervision     General bed mobility comments: extra time  Transfers   Equipment used: Rolling walker (2 wheeled) Transfers: Sit to/from Stand Sit to Stand: Supervision         General transfer comment: cues for UE/LE placement    Balance                                            ADL Overall ADL's : Needs assistance/impaired     Grooming: Supervision/safety;Standing       Lower Body Bathing: Minimal assistance;Sit to/from stand       Lower Body Dressing: Moderate assistance;Sit to/from stand   Toilet Transfer: Min guard;Ambulation       Tub/ Shower Transfer: Min guard;Walk-in shower     General ADL Comments: simulated shower transfer, backing in; pt has 2" lip.  She can perform UB adls with set up.  Pt has reacher at home, but husband will also assist as needed     Vision     Perception     Praxis      Pertinent Vitals/Pain Pain Score: 4  Pain Location: L thigh Pain Descriptors / Indicators: Sore Pain Intervention(s): Limited activity within patient's tolerance;Monitored during session;Premedicated before session;Repositioned;Ice applied     Hand Dominance     Extremity/Trunk Assessment Upper  Extremity Assessment Upper Extremity Assessment: Overall WFL for tasks assessed           Communication Communication Communication: No difficulties   Cognition Arousal/Alertness: Awake/alert Behavior During Therapy: WFL for tasks assessed/performed Overall Cognitive Status: Within Functional Limits for tasks assessed                     General Comments       Exercises       Shoulder Instructions      Home Living Family/patient expects to be discharged to:: Private residence Living Arrangements: Spouse/significant other                 Bathroom Shower/Tub: Hospital doctor Toilet: Handicapped height     Home Equipment: Environmental consultant - 2 wheels;Bedside commode;Cane - single point;Adaptive equipment;Shower seat          Prior Functioning/Environment Level of Independence: Independent with assistive device(s)        Comments: RW for mobility. bath/dress herself    OT Diagnosis: Acute pain   OT Problem List:     OT Treatment/Interventions:      OT Goals(Current goals can be found in the care plan section) Acute Rehab OT Goals Patient Stated Goal: Be able to  ride my bike again OT Goal Formulation: All assessment and education complete, DC therapy  OT Frequency:     Barriers to D/C:            Co-evaluation              End of Session    Activity Tolerance: Patient tolerated treatment well Patient left: in chair;with call bell/phone within reach   Time: [id number] OT Time Calculation (min): 18 min Charges:  OT General Charges $OT Visit: 1 Procedure OT Evaluation $OT Eval Low Complexity: 1 Procedure G-Codes:    Sol Odor 05-05-2015, 9:21 AM  Lesle Chris, OTR/L [phone] 05-05-15

## 2015-04-27 NOTE — Progress Notes (Signed)
Subjective: 2 Days Post-Op Procedure(s) (LRB): LEFT TOTAL HIP ARTHROPLASTY ANTERIOR APPROACH (Left) Patient reports pain as moderate.   Patient seen in rounds with Dr. Wynelle Link.  Husband in room. Walked 150 feet yesterday.  Will resume therapy today and then home. Patient is well, but has had some minor complaints of pain in the hip, requiring pain medications Patient is ready to go home.  Objective: Vital signs in last 24 hours: Temp:  [97.8 F (36.6 C)-98.8 F (37.1 C)] 98.3 F (36.8 C) (02/10 0448) Pulse Rate:  [88-110] 99 (02/10 0448) Resp:  [18] 18 (02/10 0448) BP: (138-157)/(64-80) 155/73 mmHg (02/10 0448) SpO2:  [97 %-100 %] 99 % (02/10 0448)  Intake/Output from previous day:  Intake/Output Summary (Last 24 hours) at 04/27/15 0752 Last data filed at 04/27/15 0449  Gross per 24 hour  Intake    360 ml  Output   1850 ml  Net  -1490 ml     Labs:  Recent Labs  04/26/15 0401 04/27/15 0406  HGB 8.9* 8.5*    Recent Labs  04/26/15 0401 04/27/15 0406  WBC 12.2* 16.1*  RBC 3.38* 3.20*  HCT 28.1* 26.4*  PLT 354 362    Recent Labs  04/26/15 0401 04/27/15 0406  NA 140 142  K 3.9 3.7  CL 106 106  CO2 25 26  BUN 11 12  CREATININE 0.72 0.63  GLUCOSE 194* 141*  CALCIUM 8.7* 8.7*   No results for input(s): LABPT, INR in the last 72 hours.  EXAM: General - Patient is Alert, Appropriate and Oriented Extremity - Neurovascular intact Sensation intact distally Dorsiflexion/Plantar flexion intact Incision - clean, dry, no drainage Motor Function - intact, moving foot and toes well on exam.   Assessment/Plan: 2 Days Post-Op Procedure(s) (LRB): LEFT TOTAL HIP ARTHROPLASTY ANTERIOR APPROACH (Left) Procedure(s) (LRB): LEFT TOTAL HIP ARTHROPLASTY ANTERIOR APPROACH (Left) Past Medical History  Diagnosis Date  . Hypertension   . Acid reflux   . Bronchitis     CHRONIC   . Anxiety     PT'S SON DIED 06-28-10  . Pneumonia     X 3  - LAST TIME WAS 06-28-2011  . H/O  hiatal hernia   . Arthritis     RA AND OA  . Pain     RIGHT KNEE - TORN MENISCUS AND ACL  . Stroke ([place]) 03/2011    TIA--PT EXPERIENCED NUMBNESS RT HAND AND ARM , HEADACHE AND NAUSEA. NO RESIDUAL PROBLEMS  . Borderline hypercholesterolemia     on medication for TIA  . Environmental allergies   . PONV (postoperative nausea and vomiting) 09-2012    sore throat after ankle surgery 6 yrs ago and severe ponv 09-2012  . Allergic rhinitis   . GERD (gastroesophageal reflux disease)     on PPI and carafte prn  . IBS (irritable bowel syndrome)   . LBP (low back pain)   . Pulmonary nodule seen on imaging study   . OA (osteoarthritis)     Left Shoulder  . Colon polyp   . Shortness of breath     with activity,bronchitis   Principal Problem:   OA (osteoarthritis) of hip  Estimated body mass index is 31.28 kg/(m^2) as calculated from the following:   Height as of this encounter: 5\' 5"  (1.651 m).   Weight as of this encounter: 85.276 kg (188 lb). Up with therapy Discharge home with home health Diet - Cardiac diet Follow up - in 2 weeks on Tuesday 2/21 Activity -  WBAT Disposition - Home Condition Upon Discharge - Good D/C Meds - See DC Summary DVT Prophylaxis - Xarelto  [name], PA-C Orthopaedic Surgery 04/27/2015, 7:52 AM

## 2015-05-29 ENCOUNTER — Encounter: Payer: Self-pay | Admitting: Gastroenterology

## 2015-07-20 ENCOUNTER — Other Ambulatory Visit: Payer: Self-pay | Admitting: Neurosurgery

## 2015-07-20 DIAGNOSIS — M5137 Other intervertebral disc degeneration, lumbosacral region: Secondary | ICD-10-CM

## 2015-07-31 ENCOUNTER — Other Ambulatory Visit: Payer: 59

## 2015-07-31 ENCOUNTER — Ambulatory Visit
Admission: RE | Admit: 2015-07-31 | Discharge: 2015-07-31 | Disposition: A | Discharge status: Home / Self Care | Payer: 59 | Source: Ambulatory Visit | Attending: Neurosurgery | Admitting: Neurosurgery

## 2015-07-31 DIAGNOSIS — M5137 Other intervertebral disc degeneration, lumbosacral region: Secondary | ICD-10-CM

## 2015-11-27 DIAGNOSIS — M533 Sacrococcygeal disorders, not elsewhere classified: Secondary | ICD-10-CM | POA: Insufficient documentation

## 2016-03-20 ENCOUNTER — Other Ambulatory Visit: Payer: Self-pay | Admitting: Neurosurgery

## 2016-03-28 IMAGING — CT CT ABD-PELV W/O CM
2 of 4 series · 16 of 46 positions shown, 18 images · non-contrast
Comparison: none

CLINICAL DATA: Generalized abdominal pain starting last night. Back
surgery 2 weeks ago.

EXAM:
CT ABDOMEN AND PELVIS WITHOUT CONTRAST
TECHNIQUE: Multidetector CT imaging of the abdomen and pelvis was performed
following the standard protocol without IV contrast.

[Series 2: abd/ pelvis 5.0 i30f 1 · axial · 0.80mm/px · z∈[+904,+1394]mm · 13 of 108 slices shown, 15 images]
[im 5/108  soft-tissue]
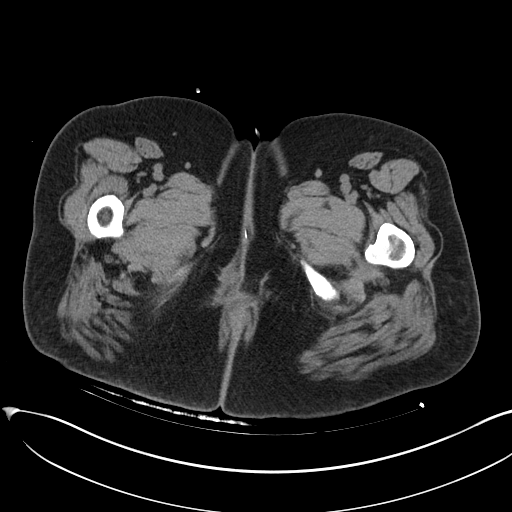
[im 5/108  bone]
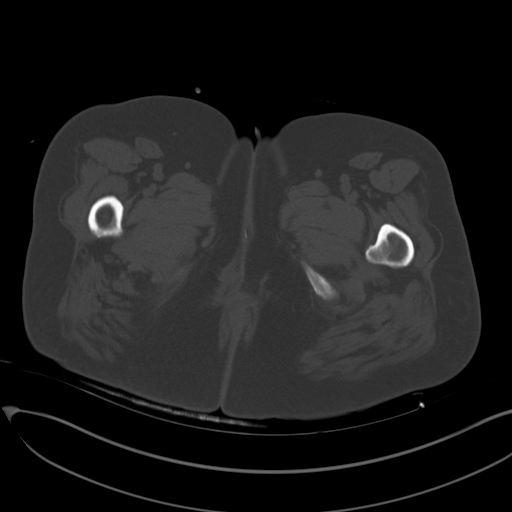
[im 13/108  soft-tissue]
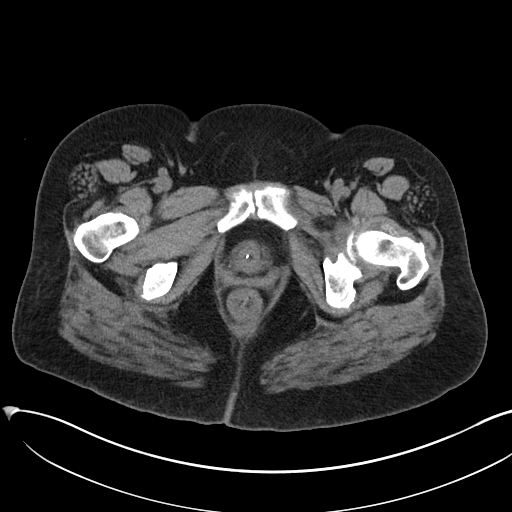
[im 22/108  soft-tissue]
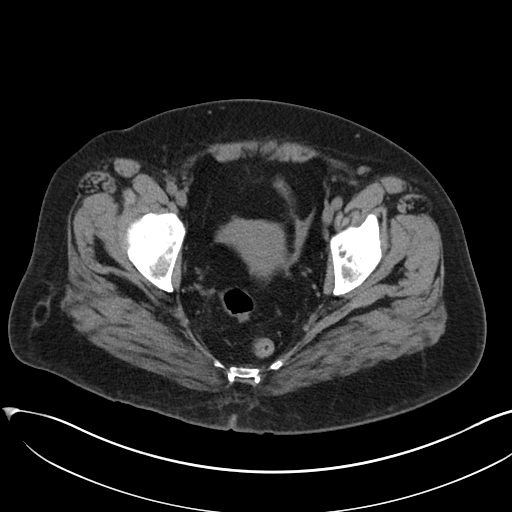
[im 30/108  soft-tissue]
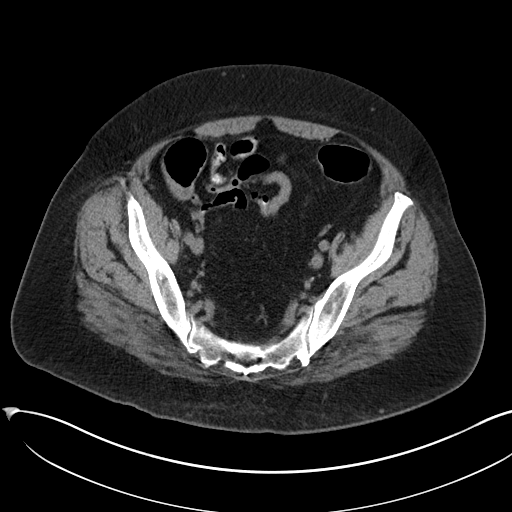
[im 39/108  soft-tissue]
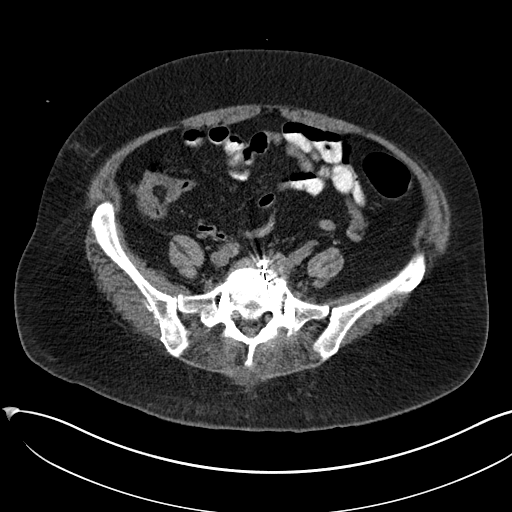
[im 48/108  soft-tissue]
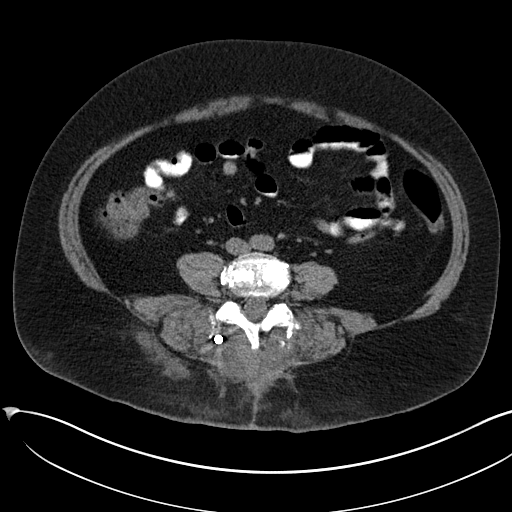
[im 56/108  soft-tissue]
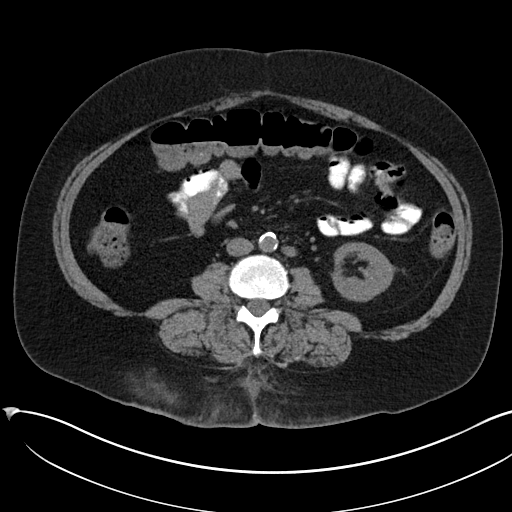
[im 60/108  soft-tissue]
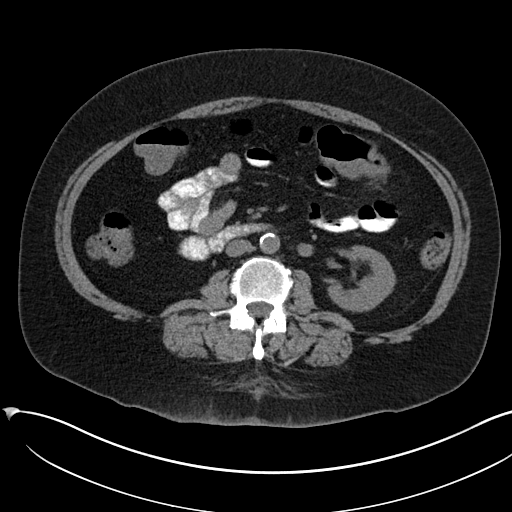
[im 69/108  soft-tissue]
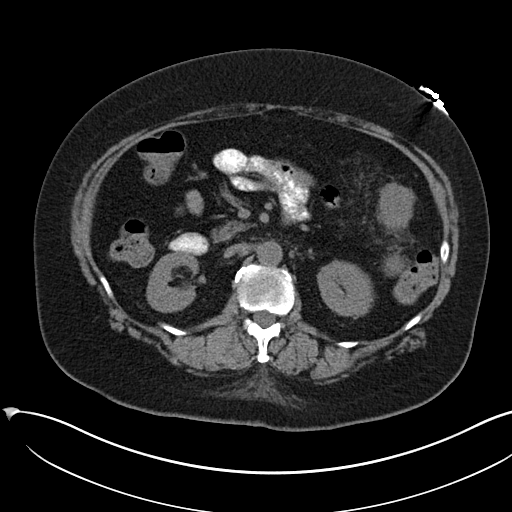
[im 69/108  bone]
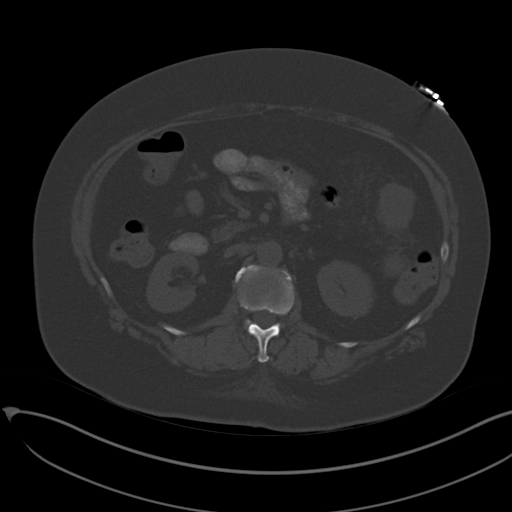
[im 78/108  soft-tissue]
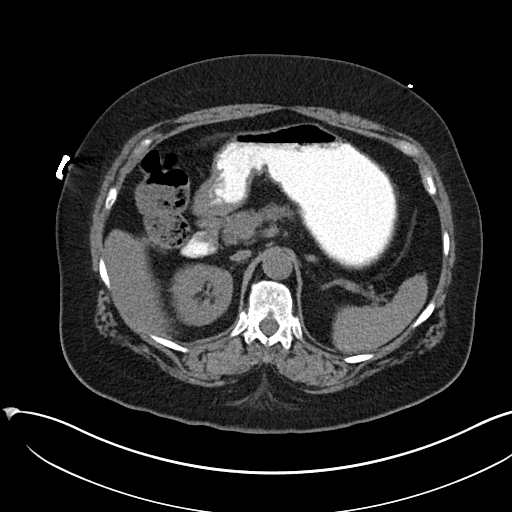
[im 86/108  soft-tissue]
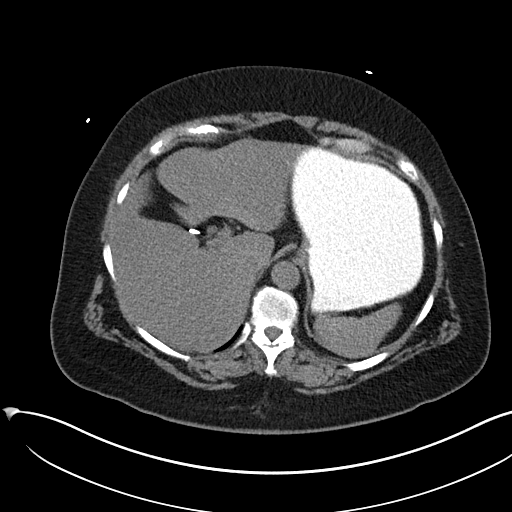
[im 95/108  soft-tissue]
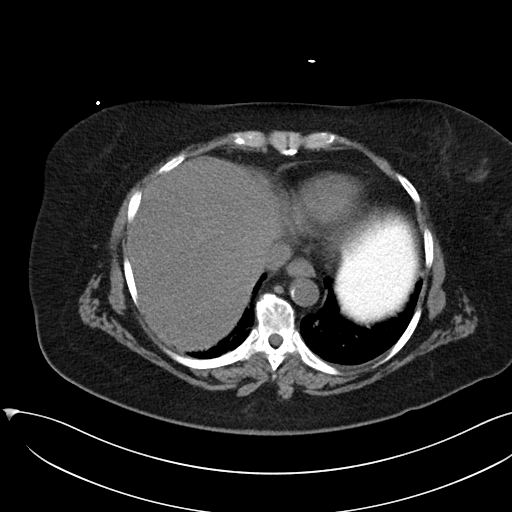
[im 103/108  soft-tissue]
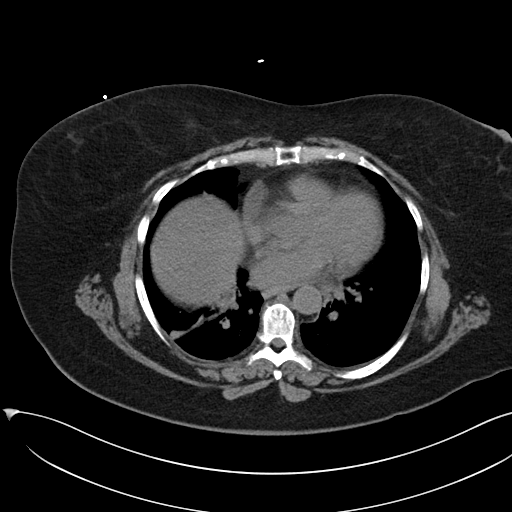

[Series 5: cor st · coronal · 0.85mm/px · 3 of 104 slices shown]
[im 35/104  soft-tissue]
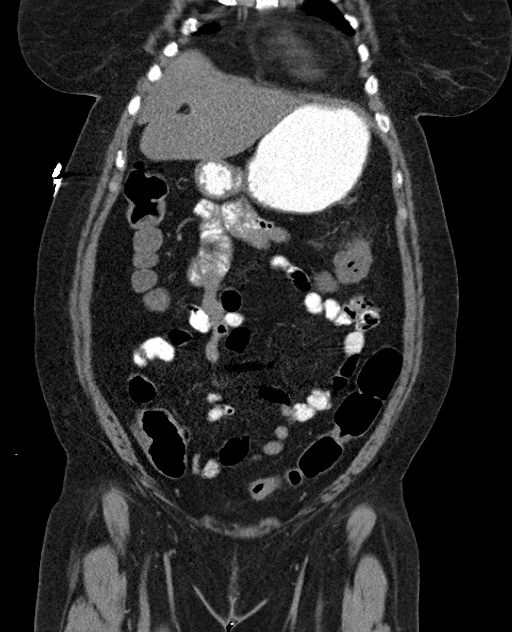
[im 46/104  soft-tissue]
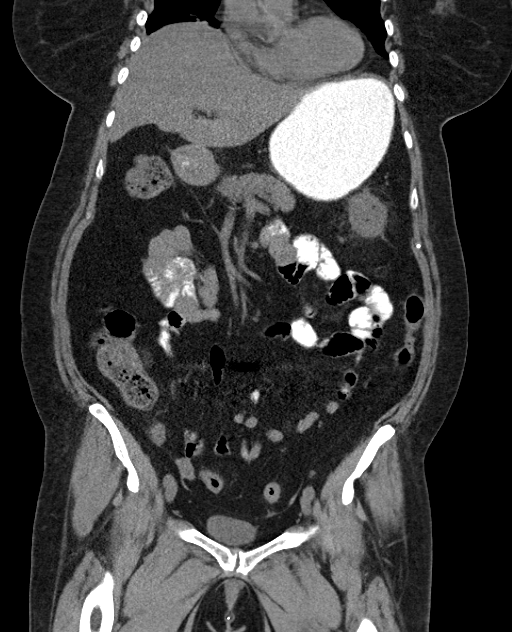
[im 58/104  soft-tissue]
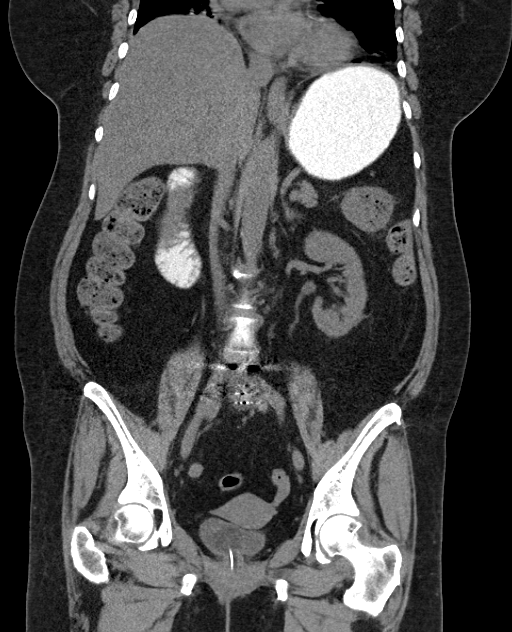

[16 of 46 positions shown; findings below may reference images not displayed]

FINDINGS: There is posterior decompression with instrumented fusion at L5-S1.
There is a small amount of fluid and soft tissue edema in the
incision. There is no soft tissue gas.

There is mild inflammatory stranding around the splenic flexure
without evidence of mass or diverticular disease. There is no
extraluminal air. There is no abscess. Remainder of the bowel is
unremarkable.

The abdominal aorta is normal in caliber with mild atherosclerotic
calcification.

There are unremarkable unenhanced appearances of the liver, bile
ducts, pancreas, spleen, adrenals and kidneys with the exception of
a 3 mm lower pole left renal collecting system calculus. There is no
hydronephrosis or ureteral dilatation.

There is mild atelectatic-appearing opacity in both lung bases
IMPRESSION: *Mild edema and fluid in the midline lower lumbar incision, within
the spectrum of expected postoperative findings.
*Mild inflammatory stranding around the splenic flexure. This could
represent minimal changes of colitis or ischemia. This is not the
expected appearance of diverticulitis.
*Nonobstructing lower pole left renal calculus.
*Mild basilar atelectatic-appearing opacities in both lungs

## 2016-03-30 IMAGING — CR DG ABDOMEN 2V
2 series · 2 of 2 positions shown · non-contrast
Comparison: CT abdomen and pelvis July 19, 2014

CLINICAL DATA: Abdominal pain, generalized, and weakness

EXAM:
ABDOMEN - 2 VIEW

[abdomen erect]
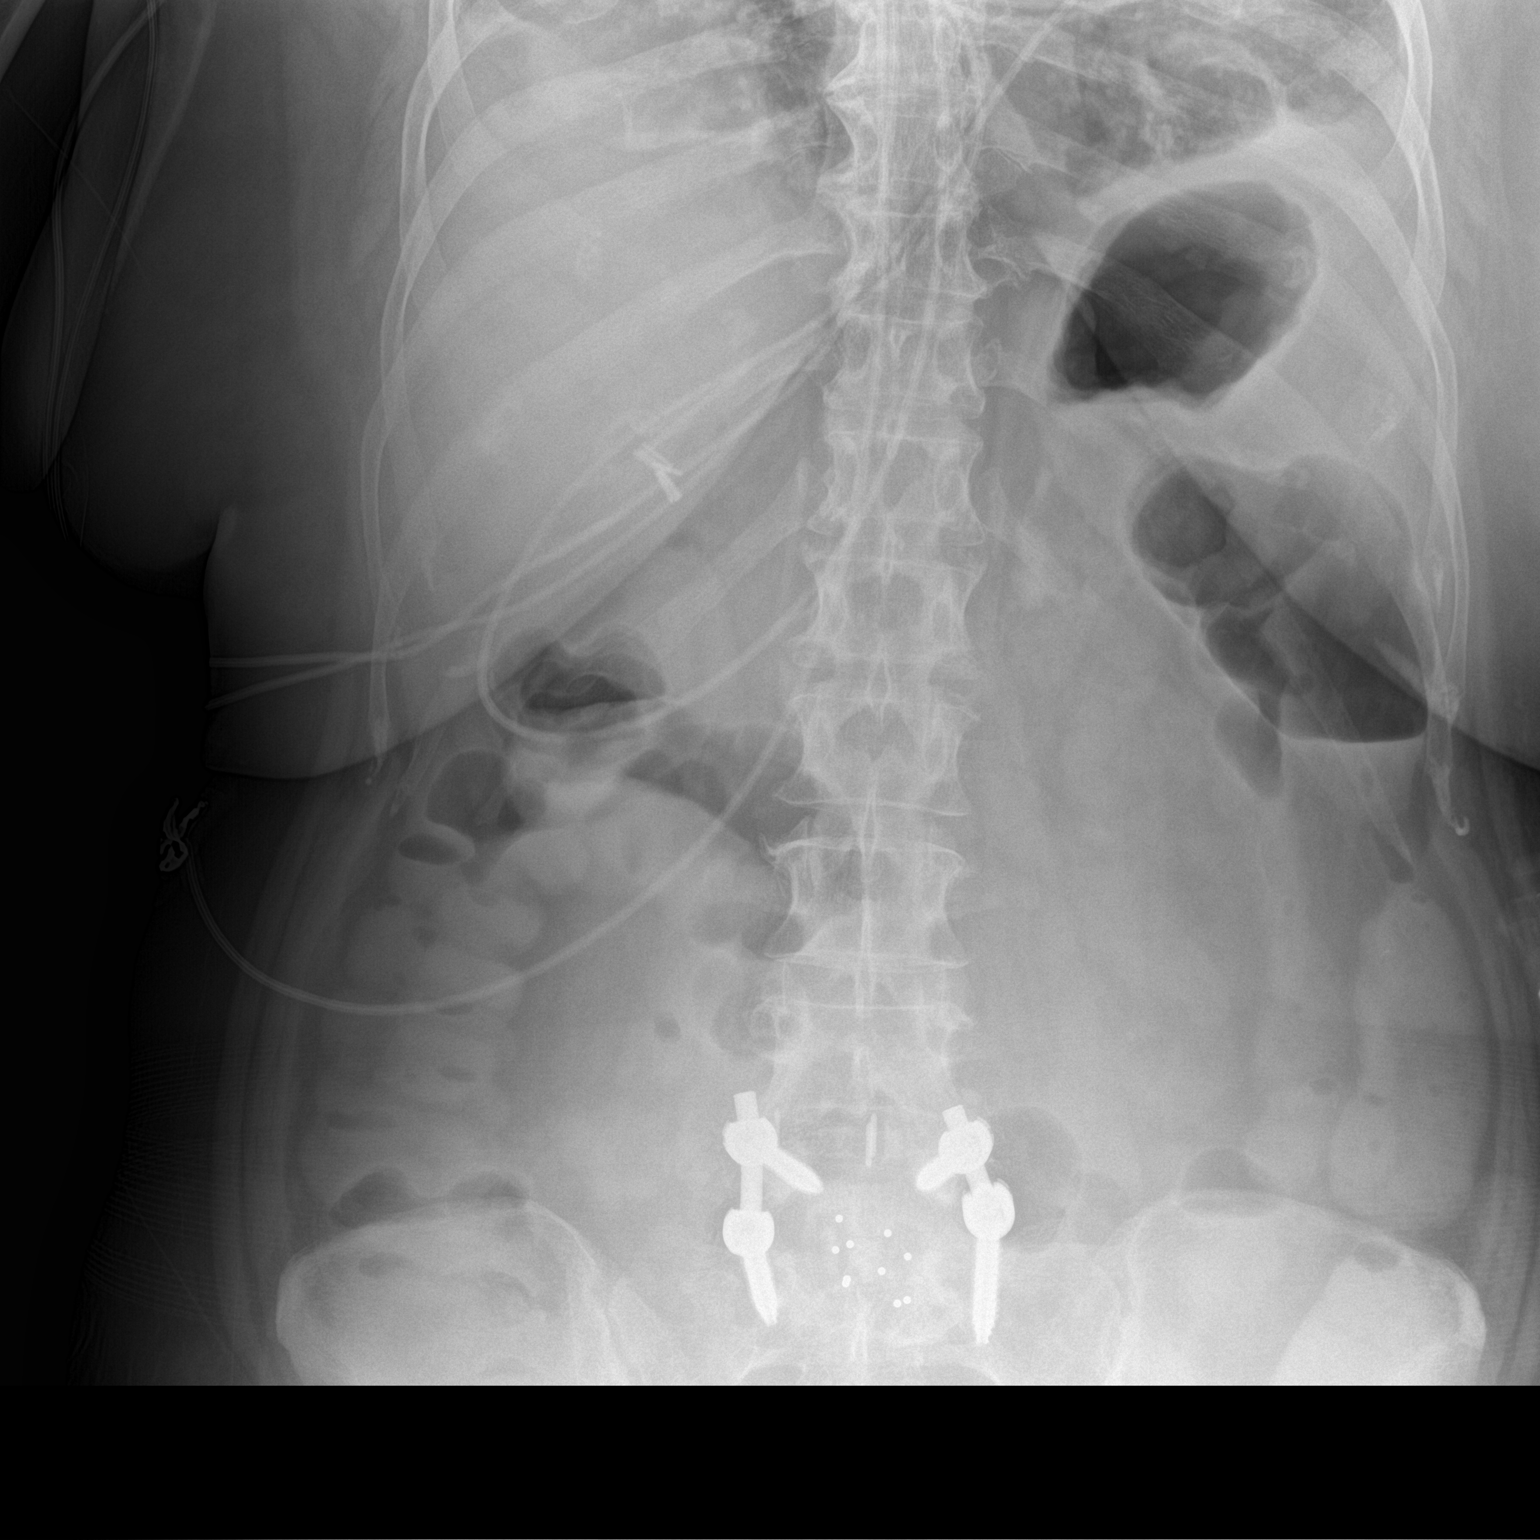

[abdomen supine]
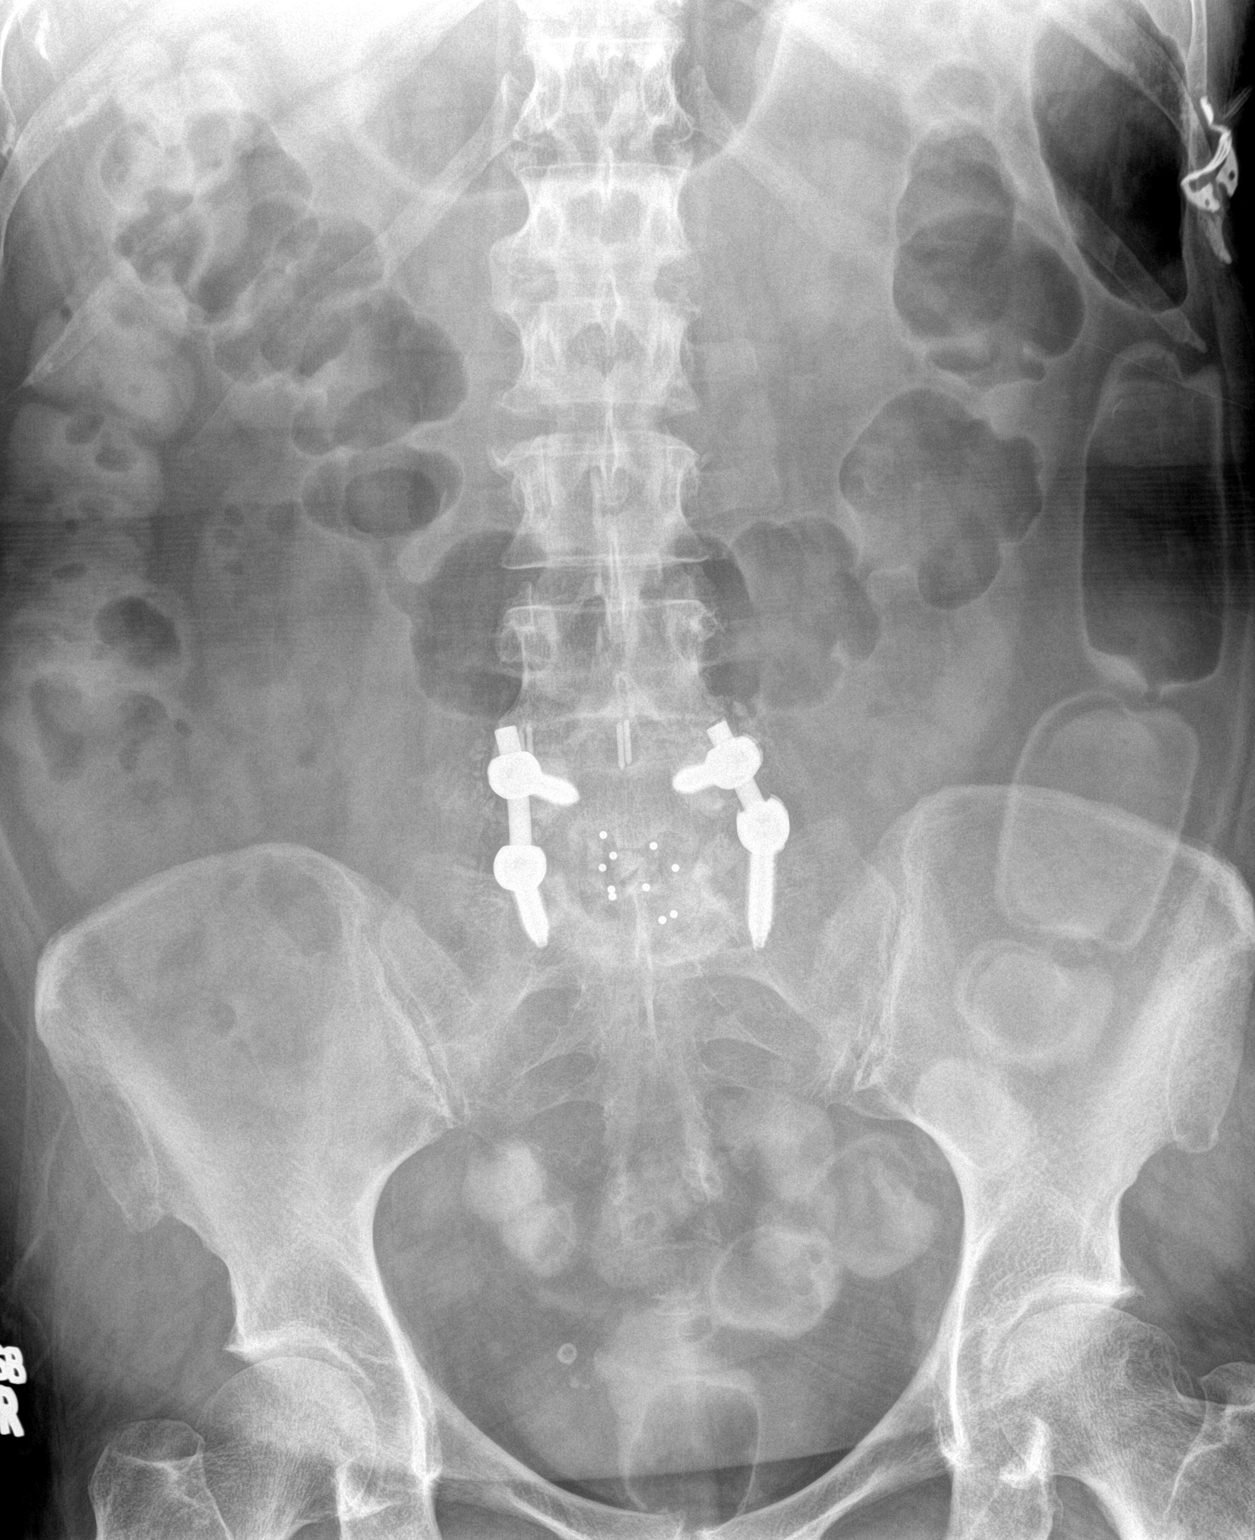

[2 of 2 positions shown; findings below may reference images not displayed]

FINDINGS: Supine and upright images were obtained. There is contrast in the
colon. There is no appreciable bowel dilatation or air-fluid level
suggesting obstruction. No free air. There are surgical clips in the
right upper quadrant. There is postoperative change in the lower
lumbar spine region.
IMPRESSION: Bowel gas pattern unremarkable. No obstruction or free air. Contrast
in large bowel.

## 2016-04-02 ENCOUNTER — Encounter (HOSPITAL_COMMUNITY): Payer: Self-pay

## 2016-04-02 ENCOUNTER — Inpatient Hospital Stay (HOSPITAL_COMMUNITY)
Admission: RE | Admit: 2016-04-02 | Discharge: 2016-04-02 | Disposition: A | Discharge status: Home / Self Care | Payer: 59 | Source: Ambulatory Visit

## 2016-04-02 NOTE — Progress Notes (Signed)
Patient verbalized understanding no further questions

## 2016-04-07 ENCOUNTER — Ambulatory Visit (HOSPITAL_COMMUNITY): Payer: Medicare Other | Admitting: Certified Registered Nurse Anesthetist

## 2016-04-07 ENCOUNTER — Ambulatory Visit (HOSPITAL_COMMUNITY): Payer: Medicare Other

## 2016-04-07 ENCOUNTER — Inpatient Hospital Stay (HOSPITAL_COMMUNITY)
Admission: AD | Admit: 2016-04-07 | Discharge: 2016-04-08 | DRG: 460 | Disposition: A | Discharge status: Home / Self Care | Payer: Medicare Other | Source: Ambulatory Visit | Attending: Neurosurgery | Admitting: Neurosurgery

## 2016-04-07 ENCOUNTER — Encounter (HOSPITAL_COMMUNITY): Payer: Self-pay | Admitting: *Deleted

## 2016-04-07 ENCOUNTER — Encounter (HOSPITAL_COMMUNITY)
Admission: AD | Disposition: A | Discharge status: Home / Self Care | Payer: Self-pay | Source: Ambulatory Visit | Attending: Neurosurgery

## 2016-04-07 DIAGNOSIS — T8484XA Pain due to internal orthopedic prosthetic devices, implants and grafts, initial encounter: Secondary | ICD-10-CM | POA: Diagnosis present

## 2016-04-07 DIAGNOSIS — Z6832 Body mass index (BMI) 32.0-32.9, adult: Secondary | ICD-10-CM

## 2016-04-07 DIAGNOSIS — Y831 Surgical operation with implant of artificial internal device as the cause of abnormal reaction of the patient, or of later complication, without mention of misadventure at the time of the procedure: Secondary | ICD-10-CM | POA: Diagnosis present

## 2016-04-07 DIAGNOSIS — Z885 Allergy status to narcotic agent status: Secondary | ICD-10-CM

## 2016-04-07 DIAGNOSIS — T84226A Displacement of internal fixation device of vertebrae, initial encounter: Secondary | ICD-10-CM | POA: Diagnosis present

## 2016-04-07 DIAGNOSIS — Z801 Family history of malignant neoplasm of trachea, bronchus and lung: Secondary | ICD-10-CM

## 2016-04-07 DIAGNOSIS — Z8249 Family history of ischemic heart disease and other diseases of the circulatory system: Secondary | ICD-10-CM | POA: Diagnosis not present

## 2016-04-07 DIAGNOSIS — M069 Rheumatoid arthritis, unspecified: Secondary | ICD-10-CM | POA: Diagnosis present

## 2016-04-07 DIAGNOSIS — M960 Pseudarthrosis after fusion or arthrodesis: Secondary | ICD-10-CM | POA: Diagnosis present

## 2016-04-07 DIAGNOSIS — Z888 Allergy status to other drugs, medicaments and biological substances status: Secondary | ICD-10-CM | POA: Diagnosis not present

## 2016-04-07 DIAGNOSIS — Z8673 Personal history of transient ischemic attack (TIA), and cerebral infarction without residual deficits: Secondary | ICD-10-CM

## 2016-04-07 DIAGNOSIS — E7800 Pure hypercholesterolemia, unspecified: Secondary | ICD-10-CM | POA: Diagnosis present

## 2016-04-07 DIAGNOSIS — Z7952 Long term (current) use of systemic steroids: Secondary | ICD-10-CM

## 2016-04-07 DIAGNOSIS — I10 Essential (primary) hypertension: Secondary | ICD-10-CM | POA: Diagnosis present

## 2016-04-07 DIAGNOSIS — K219 Gastro-esophageal reflux disease without esophagitis: Secondary | ICD-10-CM | POA: Diagnosis present

## 2016-04-07 DIAGNOSIS — S32009K Unspecified fracture of unspecified lumbar vertebra, subsequent encounter for fracture with nonunion: Secondary | ICD-10-CM | POA: Diagnosis present

## 2016-04-07 DIAGNOSIS — Z419 Encounter for procedure for purposes other than remedying health state, unspecified: Secondary | ICD-10-CM

## 2016-04-07 HISTORY — PX: HARDWARE REMOVAL: SHX979

## 2016-04-07 LAB — SURGICAL PCR SCREEN
MRSA, PCR: NEGATIVE
Staphylococcus aureus: NEGATIVE

## 2016-04-07 LAB — CBC
HEMATOCRIT: 38.1 % (ref 36.0–46.0)
HEMOGLOBIN: 12.3 g/dL (ref 12.0–15.0)
MCH: 26.6 pg (ref 26.0–34.0)
MCHC: 32.3 g/dL (ref 30.0–36.0)
MCV: 82.3 fL (ref 78.0–100.0)
PLATELETS: 324 10*3/uL (ref 150–400)
RBC: 4.63 MIL/uL (ref 3.87–5.11)
RDW: 15.6 % — ABNORMAL HIGH (ref 11.5–15.5)
WBC: 10.8 10*3/uL — AB (ref 4.0–10.5)

## 2016-04-07 LAB — BASIC METABOLIC PANEL
ANION GAP: 10 (ref 5–15)
BUN: 14 mg/dL (ref 6–20)
CHLORIDE: 106 mmol/L (ref 101–111)
CO2: 24 mmol/L (ref 22–32)
Calcium: 9.1 mg/dL (ref 8.9–10.3)
Creatinine, Ser: 0.84 mg/dL (ref 0.44–1.00)
GFR calc Af Amer: >60 mL/min (ref >60)
GLUCOSE: 112 mg/dL — AB (ref 65–99)
POTASSIUM: 3.7 mmol/L (ref 3.5–5.1)
Sodium: 140 mmol/L (ref 135–145)

## 2016-04-07 SURGERY — REMOVAL, HARDWARE
Anesthesia: General | Site: Back

## 2016-04-07 MED ORDER — ATORVASTATIN CALCIUM 20 MG PO TABS
20.0000 mg | ORAL_TABLET | Freq: Every day | ORAL | Status: DC
  Administered 2016-04-08: 20 mg via ORAL
  Filled 2016-04-07: qty 1

## 2016-04-07 MED ORDER — ALPRAZOLAM 0.5 MG PO TABS
0.5000 mg | ORAL_TABLET | Freq: Every day | ORAL | Status: DC
  Administered 2016-04-07: 0.5 mg via ORAL
  Filled 2016-04-07: qty 1

## 2016-04-07 MED ORDER — TRAMADOL HCL 50 MG PO TABS
50.0000 mg | ORAL_TABLET | Freq: Every day | ORAL | Status: DC
  Administered 2016-04-08: 100 mg via ORAL
  Filled 2016-04-07: qty 2

## 2016-04-07 MED ORDER — ONDANSETRON HCL 4 MG/2ML IJ SOLN
INTRAMUSCULAR | Status: AC
  Filled 2016-04-07: qty 2

## 2016-04-07 MED ORDER — MIDAZOLAM HCL 2 MG/2ML IJ SOLN
INTRAMUSCULAR | Status: AC
  Filled 2016-04-07: qty 2

## 2016-04-07 MED ORDER — LACTATED RINGERS IV SOLN
INTRAVENOUS | Status: DC
  Administered 2016-04-07: 50 mL/h via INTRAVENOUS

## 2016-04-07 MED ORDER — PROPOFOL 10 MG/ML IV BOLUS
INTRAVENOUS | Status: DC | PRN
  Administered 2016-04-07: 50 mg via INTRAVENOUS
  Administered 2016-04-07: 150 mg via INTRAVENOUS

## 2016-04-07 MED ORDER — BUPIVACAINE LIPOSOME 1.3 % IJ SUSP
INTRAMUSCULAR | Status: DC | PRN
  Administered 2016-04-07: 20 mL

## 2016-04-07 MED ORDER — MENTHOL 3 MG MT LOZG: 1.0000 | LOZENGE | OROMUCOSAL | Status: DC | PRN

## 2016-04-07 MED ORDER — CYCLOBENZAPRINE HCL 10 MG PO TABS: 10.0000 mg | ORAL_TABLET | Freq: Three times a day (TID) | ORAL | Status: DC | PRN

## 2016-04-07 MED ORDER — LEFLUNOMIDE 20 MG PO TABS: 20.0000 mg | ORAL_TABLET | Freq: Every day | ORAL | Status: DC

## 2016-04-07 MED ORDER — BUPIVACAINE HCL (PF) 0.25 % IJ SOLN
INTRAMUSCULAR | Status: AC
  Filled 2016-04-07: qty 30

## 2016-04-07 MED ORDER — VANCOMYCIN HCL 1000 MG IV SOLR
INTRAVENOUS | Status: DC | PRN
  Administered 2016-04-07: 1000 mg

## 2016-04-07 MED ORDER — HYDROMORPHONE HCL 2 MG/ML IJ SOLN
INTRAMUSCULAR | Status: AC
  Filled 2016-04-07: qty 1

## 2016-04-07 MED ORDER — HYDROMORPHONE HCL 1 MG/ML IJ SOLN
0.2500 mg | INTRAMUSCULAR | Status: DC | PRN
  Administered 2016-04-07 (×2): 0.5 mg via INTRAVENOUS

## 2016-04-07 MED ORDER — FENTANYL CITRATE (PF) 100 MCG/2ML IJ SOLN
INTRAMUSCULAR | Status: AC
  Filled 2016-04-07: qty 4

## 2016-04-07 MED ORDER — PHENOL 1.4 % MT LIQD: 1.0000 | OROMUCOSAL | Status: DC | PRN

## 2016-04-07 MED ORDER — CHLORHEXIDINE GLUCONATE CLOTH 2 % EX PADS: 6.0000 | MEDICATED_PAD | Freq: Once | CUTANEOUS | Status: DC

## 2016-04-07 MED ORDER — ROCURONIUM BROMIDE 50 MG/5ML IV SOSY
PREFILLED_SYRINGE | INTRAVENOUS | Status: AC
  Filled 2016-04-07: qty 5

## 2016-04-07 MED ORDER — PHENYLEPHRINE HCL 10 MG/ML IJ SOLN
INTRAMUSCULAR | Status: DC | PRN
  Administered 2016-04-07 (×2): 80 ug via INTRAVENOUS
  Administered 2016-04-07: 40 ug via INTRAVENOUS
  Administered 2016-04-07: 80 ug via INTRAVENOUS

## 2016-04-07 MED ORDER — SODIUM CHLORIDE 0.9% FLUSH
3.0000 mL | Freq: Two times a day (BID) | INTRAVENOUS | Status: DC
  Administered 2016-04-07: 3 mL via INTRAVENOUS

## 2016-04-07 MED ORDER — ONDANSETRON HCL 4 MG/2ML IJ SOLN: 4.0000 mg | INTRAMUSCULAR | Status: DC | PRN

## 2016-04-07 MED ORDER — VANCOMYCIN HCL 1000 MG IV SOLR
INTRAVENOUS | Status: AC
  Filled 2016-04-07: qty 1000

## 2016-04-07 MED ORDER — LIDOCAINE HCL (CARDIAC) 20 MG/ML IV SOLN
INTRAVENOUS | Status: DC | PRN
Start: 2016-04-07 — End: 2016-04-07
  Administered 2016-04-07: 20 mg via INTRAVENOUS

## 2016-04-07 MED ORDER — FENTANYL CITRATE (PF) 100 MCG/2ML IJ SOLN
INTRAMUSCULAR | Status: DC | PRN
  Administered 2016-04-07 (×4): 50 ug via INTRAVENOUS
  Administered 2016-04-07: 100 ug via INTRAVENOUS

## 2016-04-07 MED ORDER — SUGAMMADEX SODIUM 200 MG/2ML IV SOLN
INTRAVENOUS | Status: DC | PRN
  Administered 2016-04-07: 200 mg via INTRAVENOUS

## 2016-04-07 MED ORDER — ONDANSETRON HCL 4 MG/2ML IJ SOLN
INTRAMUSCULAR | Status: DC | PRN
  Administered 2016-04-07: 4 mg via INTRAVENOUS

## 2016-04-07 MED ORDER — BACITRACIN 50000 UNITS IM SOLR
INTRAMUSCULAR | Status: DC | PRN
  Administered 2016-04-07: 13:00:00

## 2016-04-07 MED ORDER — DIPHENHYDRAMINE HCL 50 MG/ML IJ SOLN
INTRAMUSCULAR | Status: DC | PRN
  Administered 2016-04-07: 12.5 mg via INTRAVENOUS

## 2016-04-07 MED ORDER — LIDOCAINE-EPINEPHRINE (PF) 2 %-1:200000 IJ SOLN
INTRAMUSCULAR | Status: AC
  Filled 2016-04-07: qty 20

## 2016-04-07 MED ORDER — LORATADINE 10 MG PO TABS
10.0000 mg | ORAL_TABLET | Freq: Every day | ORAL | Status: DC
  Administered 2016-04-08: 10 mg via ORAL
  Filled 2016-04-07: qty 1

## 2016-04-07 MED ORDER — PROMETHAZINE HCL 25 MG/ML IJ SOLN: 6.2500 mg | INTRAMUSCULAR | Status: DC | PRN

## 2016-04-07 MED ORDER — ACETAMINOPHEN 10 MG/ML IV SOLN
INTRAVENOUS | Status: DC | PRN
  Administered 2016-04-07: 1000 mg via INTRAVENOUS

## 2016-04-07 MED ORDER — PROMETHAZINE HCL 25 MG PO TABS
25.0000 mg | ORAL_TABLET | ORAL | Status: DC | PRN
Start: 2016-04-07 — End: 2016-04-08

## 2016-04-07 MED ORDER — MIDAZOLAM HCL 2 MG/2ML IJ SOLN: 0.5000 mg | Freq: Once | INTRAMUSCULAR | Status: DC | PRN

## 2016-04-07 MED ORDER — LACTATED RINGERS IV SOLN
INTRAVENOUS | Status: DC
  Administered 2016-04-07 (×2): via INTRAVENOUS

## 2016-04-07 MED ORDER — THROMBIN 5000 UNITS EX SOLR
CUTANEOUS | Status: DC | PRN
  Administered 2016-04-07 (×2): 5000 [IU] via TOPICAL

## 2016-04-07 MED ORDER — OXYCODONE-ACETAMINOPHEN 5-325 MG PO TABS
1.0000 | ORAL_TABLET | ORAL | Status: DC | PRN
  Administered 2016-04-07: 1 via ORAL
  Administered 2016-04-08: 2 via ORAL
  Filled 2016-04-07 (×2): qty 2

## 2016-04-07 MED ORDER — BUPIVACAINE LIPOSOME 1.3 % IJ SUSP
20.0000 mL | INTRAMUSCULAR | Status: DC
  Filled 2016-04-07: qty 20

## 2016-04-07 MED ORDER — SUCRALFATE 1 G PO TABS
1.0000 g | ORAL_TABLET | Freq: Two times a day (BID) | ORAL | Status: DC | PRN
Start: 2016-04-07 — End: 2016-04-08
  Filled 2016-04-07: qty 1

## 2016-04-07 MED ORDER — THROMBIN 5000 UNITS EX SOLR
CUTANEOUS | Status: AC
  Filled 2016-04-07: qty 10000

## 2016-04-07 MED ORDER — ACETAMINOPHEN 325 MG PO TABS: 650.0000 mg | ORAL_TABLET | ORAL | Status: DC | PRN

## 2016-04-07 MED ORDER — SODIUM CHLORIDE 0.9% FLUSH: 3.0000 mL | INTRAVENOUS | Status: DC | PRN

## 2016-04-07 MED ORDER — PREDNISONE 5 MG PO TABS
5.0000 mg | ORAL_TABLET | Freq: Every day | ORAL | Status: DC
  Administered 2016-04-08: 5 mg via ORAL
  Filled 2016-04-07: qty 1

## 2016-04-07 MED ORDER — SCOPOLAMINE 1 MG/3DAYS TD PT72
1.0000 | MEDICATED_PATCH | Freq: Once | TRANSDERMAL | Status: AC
  Administered 2016-04-07: 1 via TRANSDERMAL

## 2016-04-07 MED ORDER — NORTRIPTYLINE HCL 25 MG PO CAPS
25.0000 mg | ORAL_CAPSULE | Freq: Every day | ORAL | Status: DC
  Administered 2016-04-07: 25 mg via ORAL
  Filled 2016-04-07: qty 1

## 2016-04-07 MED ORDER — PROPOFOL 10 MG/ML IV BOLUS
INTRAVENOUS | Status: AC
  Filled 2016-04-07: qty 20

## 2016-04-07 MED ORDER — CEFAZOLIN SODIUM-DEXTROSE 2-4 GM/100ML-% IV SOLN
2.0000 g | INTRAVENOUS | Status: AC
  Administered 2016-04-07: 2 g via INTRAVENOUS
  Filled 2016-04-07: qty 100

## 2016-04-07 MED ORDER — LIDOCAINE-EPINEPHRINE (PF) 2 %-1:200000 IJ SOLN
INTRAMUSCULAR | Status: DC | PRN
  Administered 2016-04-07: 10 mL via INTRADERMAL

## 2016-04-07 MED ORDER — LISINOPRIL 20 MG PO TABS
40.0000 mg | ORAL_TABLET | Freq: Every day | ORAL | Status: DC
  Administered 2016-04-08: 40 mg via ORAL
  Filled 2016-04-07: qty 2

## 2016-04-07 MED ORDER — AMLODIPINE BESYLATE 5 MG PO TABS
5.0000 mg | ORAL_TABLET | Freq: Every day | ORAL | Status: DC
  Administered 2016-04-08: 5 mg via ORAL
  Filled 2016-04-07: qty 1

## 2016-04-07 MED ORDER — CEFAZOLIN SODIUM-DEXTROSE 2-4 GM/100ML-% IV SOLN
2.0000 g | Freq: Three times a day (TID) | INTRAVENOUS | Status: AC
  Administered 2016-04-07 – 2016-04-08 (×2): 2 g via INTRAVENOUS
  Filled 2016-04-07 (×2): qty 100

## 2016-04-07 MED ORDER — DEXAMETHASONE SODIUM PHOSPHATE 10 MG/ML IJ SOLN
INTRAMUSCULAR | Status: AC
Start: 2016-04-07 — End: 2016-04-07
  Filled 2016-04-07: qty 1

## 2016-04-07 MED ORDER — HYDROMORPHONE HCL 1 MG/ML IJ SOLN: 0.5000 mg | INTRAMUSCULAR | Status: DC | PRN

## 2016-04-07 MED ORDER — MUPIROCIN 2 % EX OINT
1.0000 "application " | TOPICAL_OINTMENT | Freq: Once | CUTANEOUS | Status: AC
  Administered 2016-04-07: 1 via TOPICAL
  Filled 2016-04-07: qty 22

## 2016-04-07 MED ORDER — ACETAMINOPHEN 10 MG/ML IV SOLN
INTRAVENOUS | Status: AC
  Filled 2016-04-07: qty 100

## 2016-04-07 MED ORDER — MEPERIDINE HCL 25 MG/ML IJ SOLN: 6.2500 mg | INTRAMUSCULAR | Status: DC | PRN

## 2016-04-07 MED ORDER — ALUM & MAG HYDROXIDE-SIMETH 200-200-20 MG/5ML PO SUSP: 30.0000 mL | Freq: Four times a day (QID) | ORAL | Status: DC | PRN

## 2016-04-07 MED ORDER — DEXAMETHASONE SODIUM PHOSPHATE 10 MG/ML IJ SOLN
10.0000 mg | INTRAMUSCULAR | Status: AC
  Administered 2016-04-07: 10 mg via INTRAVENOUS
  Filled 2016-04-07: qty 1

## 2016-04-07 MED ORDER — PANTOPRAZOLE SODIUM 40 MG IV SOLR: 40.0000 mg | Freq: Every day | INTRAVENOUS | Status: DC

## 2016-04-07 MED ORDER — ROCURONIUM BROMIDE 100 MG/10ML IV SOLN
INTRAVENOUS | Status: DC | PRN
  Administered 2016-04-07: 50 mg via INTRAVENOUS
  Administered 2016-04-07: 10 mg via INTRAVENOUS

## 2016-04-07 MED ORDER — PHENYLEPHRINE 40 MCG/ML (10ML) SYRINGE FOR IV PUSH (FOR BLOOD PRESSURE SUPPORT)
PREFILLED_SYRINGE | INTRAVENOUS | Status: AC
  Filled 2016-04-07: qty 10

## 2016-04-07 MED ORDER — VITAMIN D (ERGOCALCIFEROL) 1.25 MG (50000 UNIT) PO CAPS
50000.0000 [IU] | ORAL_CAPSULE | ORAL | Status: DC
  Administered 2016-04-07: 50000 [IU] via ORAL
  Filled 2016-04-07: qty 1

## 2016-04-07 MED ORDER — 0.9 % SODIUM CHLORIDE (POUR BTL) OPTIME
TOPICAL | Status: DC | PRN
  Administered 2016-04-07: 1000 mL

## 2016-04-07 MED ORDER — FLUTICASONE PROPIONATE 50 MCG/ACT NA SUSP
2.0000 | Freq: Every day | NASAL | Status: DC | PRN
  Filled 2016-04-07: qty 16

## 2016-04-07 MED ORDER — HEMOSTATIC AGENTS (NO CHARGE) OPTIME
TOPICAL | Status: DC | PRN
  Administered 2016-04-07: 1 via TOPICAL

## 2016-04-07 MED ORDER — OXYCODONE-ACETAMINOPHEN 5-325 MG PO TABS
ORAL_TABLET | ORAL | Status: AC
  Administered 2016-04-07: 1 via ORAL
  Filled 2016-04-07: qty 1

## 2016-04-07 MED ORDER — OXYCODONE-ACETAMINOPHEN 5-325 MG PO TABS
1.0000 | ORAL_TABLET | ORAL | Status: DC | PRN
  Administered 2016-04-07: 1 via ORAL

## 2016-04-07 MED ORDER — PANTOPRAZOLE SODIUM 40 MG PO TBEC
40.0000 mg | DELAYED_RELEASE_TABLET | Freq: Every day | ORAL | Status: DC
  Administered 2016-04-07 – 2016-04-08 (×2): 40 mg via ORAL
  Filled 2016-04-07 (×2): qty 1

## 2016-04-07 MED ORDER — MIDAZOLAM HCL 5 MG/5ML IJ SOLN
INTRAMUSCULAR | Status: DC | PRN
  Administered 2016-04-07: 2 mg via INTRAVENOUS

## 2016-04-07 MED ORDER — ACETAMINOPHEN 650 MG RE SUPP: 650.0000 mg | RECTAL | Status: DC | PRN

## 2016-04-07 MED ORDER — SUGAMMADEX SODIUM 200 MG/2ML IV SOLN
INTRAVENOUS | Status: AC
  Filled 2016-04-07: qty 2

## 2016-04-07 MED ORDER — FENTANYL CITRATE (PF) 100 MCG/2ML IJ SOLN
INTRAMUSCULAR | Status: AC
  Filled 2016-04-07: qty 2

## 2016-04-07 MED ORDER — DIPHENHYDRAMINE HCL 50 MG/ML IJ SOLN
INTRAMUSCULAR | Status: AC
  Filled 2016-04-07: qty 1

## 2016-04-07 MED ORDER — PHENYLEPHRINE HCL 10 MG/ML IJ SOLN
INTRAVENOUS | Status: DC | PRN
  Administered 2016-04-07: 25 ug/min via INTRAVENOUS

## 2016-04-07 SURGICAL SUPPLY — 61 items
ADH SKN CLS APL DERMABOND .7 (GAUZE/BANDAGES/DRESSINGS) ×1
APL SKNCLS STERI-STRIP NONHPOA (GAUZE/BANDAGES/DRESSINGS) ×2
BAG DECANTER FOR FLEXI CONT (MISCELLANEOUS) ×3 IMPLANT
BENZOIN TINCTURE PRP APPL 2/3 (GAUZE/BANDAGES/DRESSINGS) ×5 IMPLANT
BLADE CLIPPER SURG (BLADE) IMPLANT
BONE MATRIX OSTEOCEL PRO MED (Bone Implant) ×4 IMPLANT
BUR MATCHSTICK NEURO 3.0 LAGG (BURR) ×3 IMPLANT
BUR PRECISION FLUTE 6.0 (BURR) IMPLANT
CANISTER SUCT 3000ML PPV (MISCELLANEOUS) IMPLANT
CARTRIDGE OIL MAESTRO DRILL (MISCELLANEOUS) ×1 IMPLANT
CLOSURE WOUND 1/2 X4 (GAUZE/BANDAGES/DRESSINGS) ×2
DECANTER SPIKE VIAL GLASS SM (MISCELLANEOUS) ×3 IMPLANT
DERMABOND ADVANCED (GAUZE/BANDAGES/DRESSINGS) ×2
DERMABOND ADVANCED .7 DNX12 (GAUZE/BANDAGES/DRESSINGS) ×1 IMPLANT
DIFFUSER DRILL AIR PNEUMATIC (MISCELLANEOUS) ×3 IMPLANT
DRAPE HALF SHEET 40X57 (DRAPES) IMPLANT
DRAPE LAPAROTOMY 100X72X124 (DRAPES) ×3 IMPLANT
DRAPE POUCH INSTRU U-SHP 10X18 (DRAPES) ×3 IMPLANT
DRAPE SURG 17X23 STRL (DRAPES) ×3 IMPLANT
DRSG OPSITE 4X5.5 SM (GAUZE/BANDAGES/DRESSINGS) ×2 IMPLANT
DRSG OPSITE POSTOP 4X6 (GAUZE/BANDAGES/DRESSINGS) ×2 IMPLANT
ELECT BLADE 4.0 EZ CLEAN MEGAD (MISCELLANEOUS) ×6
ELECT REM PT RETURN 9FT ADLT (ELECTROSURGICAL) ×3
ELECTRODE BLDE 4.0 EZ CLN MEGD (MISCELLANEOUS) IMPLANT
ELECTRODE REM PT RTRN 9FT ADLT (ELECTROSURGICAL) ×1 IMPLANT
EVACUATOR 1/8 PVC DRAIN (DRAIN) ×2 IMPLANT
GAUZE SPONGE 4X4 12PLY STRL (GAUZE/BANDAGES/DRESSINGS) ×3 IMPLANT
GAUZE SPONGE 4X4 16PLY XRAY LF (GAUZE/BANDAGES/DRESSINGS) IMPLANT
GLOVE BIO SURGEON STRL SZ8 (GLOVE) ×3 IMPLANT
GLOVE EXAM NITRILE LRG STRL (GLOVE) IMPLANT
GLOVE EXAM NITRILE XL STR (GLOVE) IMPLANT
GLOVE EXAM NITRILE XS STR PU (GLOVE) IMPLANT
GLOVE INDICATOR 7.5 STRL GRN (GLOVE) ×4 IMPLANT
GLOVE INDICATOR 8.5 STRL (GLOVE) ×3 IMPLANT
GLOVE SS N UNI LF 7.5 STRL (GLOVE) ×2 IMPLANT
GLOVE SURG SS PI 7.0 STRL IVOR (GLOVE) ×2 IMPLANT
GOWN STRL REUS W/ TWL LRG LVL3 (GOWN DISPOSABLE) ×1 IMPLANT
GOWN STRL REUS W/ TWL XL LVL3 (GOWN DISPOSABLE) ×2 IMPLANT
GOWN STRL REUS W/TWL 2XL LVL3 (GOWN DISPOSABLE) IMPLANT
GOWN STRL REUS W/TWL LRG LVL3 (GOWN DISPOSABLE) ×6
GOWN STRL REUS W/TWL XL LVL3 (GOWN DISPOSABLE) ×3
KIT BASIN OR (CUSTOM PROCEDURE TRAY) ×3 IMPLANT
KIT INFUSE SMALL (Orthopedic Implant) ×2 IMPLANT
KIT ROOM TURNOVER OR (KITS) ×3 IMPLANT
NDL SPNL 22GX3.5 QUINCKE BK (NEEDLE) ×1 IMPLANT
NEEDLE HYPO 22GX1.5 SAFETY (NEEDLE) ×3 IMPLANT
NEEDLE SPNL 22GX3.5 QUINCKE BK (NEEDLE) ×3 IMPLANT
NS IRRIG 1000ML POUR BTL (IV SOLUTION) ×3 IMPLANT
OIL CARTRIDGE MAESTRO DRILL (MISCELLANEOUS) ×3
PACK LAMINECTOMY NEURO (CUSTOM PROCEDURE TRAY) ×3 IMPLANT
PENCIL BUTTON HOLSTER BLD 10FT (ELECTRODE) ×2 IMPLANT
SPONGE GAUZE 4X4 12PLY STER LF (GAUZE/BANDAGES/DRESSINGS) ×2 IMPLANT
SPONGE SURGIFOAM ABS GEL SZ50 (HEMOSTASIS) ×3 IMPLANT
STRIP CLOSURE SKIN 1/2X4 (GAUZE/BANDAGES/DRESSINGS) ×3 IMPLANT
SUT VIC AB 0 CT1 18XCR BRD8 (SUTURE) ×1 IMPLANT
SUT VIC AB 0 CT1 8-18 (SUTURE) ×3
SUT VIC AB 2-0 CT1 18 (SUTURE) ×3 IMPLANT
SUT VICRYL 4-0 PS2 18IN ABS (SUTURE) ×3 IMPLANT
TOWEL OR 17X24 6PK STRL BLUE (TOWEL DISPOSABLE) ×3 IMPLANT
TOWEL OR 17X26 10 PK STRL BLUE (TOWEL DISPOSABLE) ×3 IMPLANT
WATER STERILE IRR 1000ML POUR (IV SOLUTION) ×3 IMPLANT

## 2016-04-07 NOTE — Anesthesia Procedure Notes (Signed)
Procedure Name: Intubation Date/Time: 04/07/2016 1:37 PM Performed by: Merrilyn Puma B Pre-anesthesia Checklist: Patient identified, Emergency Drugs available, Suction available, Patient being monitored and Timeout performed Patient Re-evaluated:Patient Re-evaluated prior to inductionOxygen Delivery Method: Circle system utilized Preoxygenation: Pre-oxygenation with 100% oxygen Intubation Type: IV induction Ventilation: Mask ventilation without difficulty Laryngoscope Size: Mac and 3 Grade View: Grade II Tube type: Oral Number of attempts: 2 Airway Equipment and Method: Stylet and Bougie stylet Placement Confirmation: ETT inserted through vocal cords under direct vision,  positive ETCO2,  CO2 detector and breath sounds checked- equal and bilateral Secured at: 22 cm Tube secured with: Tape Dental Injury: Teeth and Oropharynx as per pre-operative assessment  Comments: DLx1 with MAC 3 - grade 1 view.  Unable to pass ETT through cords.  DLX1 by Dr. Glennon Mac ETT passed over bougie stylet.  +/= BBS.  + EtCO2.

## 2016-04-07 NOTE — H&P (Signed)
Angie Olson is an 66 y.o. female.   Chief Complaint: Back pain HPI: Patient is 66 year old female whose undergone previous L4-S1 fusion and did very well however over the last several months is a progress worsening back pain workup as revealed solid fusion posterior laterally at L5-S1 and interbody L5-S1 however there is a little bit of haloing around her S1 screws were 5 fusion appears to be solid so in the setting of solid fusion, with some halo around the screws hardware thickening in this may be toggling and C2 screws. I've extensively gone over the risks and benefits of that operation with her as well as perioperative course expectations of outcome and alternatives surgery and she understands and agrees to proceed forward.  Past Medical History:  Diagnosis Date  . Acid reflux   . Allergic rhinitis   . Anxiety    PT'S SON DIED 2010/06/10  . Arthritis    RA AND OA  . Borderline hypercholesterolemia    on medication for TIA  . Bronchitis    CHRONIC   . Colon polyp   . Environmental allergies   . GERD (gastroesophageal reflux disease)    on PPI and carafte prn  . H/O hiatal hernia   . Hypertension   . IBS (irritable bowel syndrome)   . LBP (low back pain)   . OA (osteoarthritis)    Left Shoulder  . Pain    RIGHT KNEE - TORN MENISCUS AND ACL  . Pneumonia    X 3  - LAST TIME WAS 2011/06/10  . PONV (postoperative nausea and vomiting) 09-2012   sore throat after ankle surgery 6 yrs ago and severe ponv 09-2012  . Pulmonary nodule seen on imaging study   . Shortness of breath    with activity,bronchitis  . Stroke ([place]) 03/2011   TIA--PT EXPERIENCED NUMBNESS RT HAND AND ARM , HEADACHE AND NAUSEA. NO RESIDUAL PROBLEMS    Past Surgical History:  Procedure Laterality Date  . ANKLE SURGERY  ~10 years ago   left  . BACK SURGERY  06/09/09   lower  . CHOLECYSTECTOMY  ~15 years ago  . COLONOSCOPY  [id number], 06-10-2011  . HARVEST BONE GRAFT N/A 07/05/2014   Procedure: HARVEST ILIAC BONE GRAFT;  Surgeon:  Kary Kos, MD;  Location: [facility];  Service: Neurosurgery;  Laterality: N/A;  . KNEE ARTHROSCOPY Right 10/13/2012   Procedure: RIGHT KNEE ARTHROSCOPY WITH DEBRIDEMENT, CHONDROPLASTY;  Surgeon: Gearlean Alf, MD;  Location: [facility];  Service: Orthopedics;  Laterality: Right;  . KNEE ARTHROSCOPY Right 03/02/2013   Procedure: RIGHT ARTHROSCOPY KNEE WITH MEDIAL MENISCAL  DEBRIDEMENT;  Surgeon: Gearlean Alf, MD;  Location: [facility];  Service: Orthopedics;  Laterality: Right;  . LUMBAR FUSION  07/05/2014  . LUMBAR WOUND DEBRIDEMENT N/A 01/11/2014   Procedure: Irrigation and Debridement Lumbar Wound for hematoma;  Surgeon: Elaina Hoops, MD;  Location: [facility];  Service: Neurosurgery;  Laterality: N/A;  . MAXIMUM ACCESS (MAS)POSTERIOR LUMBAR INTERBODY FUSION (PLIF) 1 LEVEL N/A 12/05/2013   Procedure: FOR MAXIMUM ACCESS (MAS) POSTERIOR LUMBAR INTERBODY FUSION (PLIF)LUMBAR FIVE-SACRAL-ONE,REMOVAL HARDWARE LUMBAR FOUR-FIVE;  Surgeon: Elaina Hoops, MD;  Location: [facility];  Service: Neurosurgery;  Laterality: N/A;  . Mohr's procedure  02/2015   face  . ROOT CANAL    . SHOULDER SURGERY Left ~14 years ago   "scope"  . thumb surgery Left ~14 years ago   x2  . TOTAL HIP ARTHROPLASTY Left 04/25/2015   Procedure: LEFT TOTAL  HIP ARTHROPLASTY ANTERIOR APPROACH;  Surgeon: Gaynelle Arabian, MD;  Location: [facility];  Service: Orthopedics;  Laterality: Left;  . TOTAL KNEE ARTHROPLASTY Right 07/25/2013   Procedure: RIGHT TOTAL KNEE ARTHROPLASTY;  Surgeon: Gearlean Alf, MD;  Location: [facility];  Service: Orthopedics;  Laterality: Right;  . TRIGGER FINGER RELEASE  ~6 years ago   X2 ON RIGHT HAND  . TUBAL LIGATION      Family History  Problem Relation Age of Onset  . Heart disease Sister   . Lung cancer Brother   . Heart disease Brother   . CAD Brother   . Heart attack Mother   . Emphysema Father   . Hypertension Daughter   . Colon cancer Neg Hx    Social History:  reports that she has never smoked. She  has never used smokeless tobacco. She reports that she does not drink alcohol or use drugs.  Allergies:  Allergies  Allergen Reactions  . Methotrexate Derivatives Other (See Comments)    Causes recurrent infections  . Pseudoephedrine Other (See Comments)    Makes jittery   . Reglan [Metoclopramide] Other (See Comments)    insomnia  . Vicodin [Hydrocodone-Acetaminophen] Other (See Comments)    "Makes me feel weird"    Medications Prior to Admission  Medication Sig Dispense Refill  . ALPRAZolam (XANAX) 1 MG tablet Take 0.5-1 mg by mouth at bedtime.     Marland Kitchen amLODipine (NORVASC) 5 MG tablet Take 5 mg by mouth daily.     Marland Kitchen atorvastatin (LIPITOR) 20 MG tablet Take 20 mg by mouth daily.     . cetirizine (ZYRTEC) 10 MG tablet Take 10 mg by mouth daily.    . cyclobenzaprine (FLEXERIL) 10 MG tablet Take 10 mg by mouth 3 (three) times daily as needed. For muscle spasms  0  . esomeprazole (NEXIUM) 20 MG capsule Take 20 mg by mouth daily.     . fluticasone (FLONASE) 50 MCG/ACT nasal spray Place 2 sprays into both nostrils daily as needed for allergies.     Marland Kitchen lisinopril (PRINIVIL,ZESTRIL) 40 MG tablet Take 40 mg by mouth daily.    . nortriptyline (PAMELOR) 25 MG capsule Take 25 mg by mouth at bedtime.   2  . predniSONE (DELTASONE) 5 MG tablet Take 5 mg by mouth daily. Continuous for rheumatoid arthritis    . promethazine (PHENERGAN) 25 MG tablet Take 1 tablet (25 mg total) by mouth every 4 (four) hours as needed for nausea or vomiting. 30 tablet 2  . sucralfate (CARAFATE) 1 G tablet Take 1 g by mouth 2 (two) times daily as needed (indigestion).     . traMADol (ULTRAM) 50 MG tablet Take 1-2 tablets (50-100 mg total) by mouth every 6 (six) hours as needed (mild pain). (Patient taking differently: Take 50-100 mg by mouth at bedtime. ) 80 tablet 1  . Vitamin D, Ergocalciferol, (DRISDOL) 50000 units CAPS capsule Take 50,000 Units by mouth every Monday.  0  . leflunomide (ARAVA) 20 MG tablet Take 20 mg by  mouth daily.    . naloxegol oxalate (MOVANTIK) 25 MG TABS tablet Take 1 tablet (25 mg total) by mouth daily. (Patient not taking: Reported on 03/27/2016) 30 tablet 1  . oxyCODONE-acetaminophen (PERCOCET/ROXICET) 5-325 MG tablet Take 1-2 tablets by mouth every 4 (four) hours as needed for pain.  0  . VOLTAREN 1 % GEL Apply 4 g topically 4 (four) times daily as needed. For pain  5    Results for orders placed or  performed during the hospital encounter of 04/07/16 (from the past 48 hour(s))  CBC     Status: Abnormal   Collection Time: 04/07/16 12:04 PM  Result Value Ref Range   WBC 10.8 (H) 4.0 - 10.5 K/uL   RBC 4.63 3.87 - 5.11 MIL/uL   Hemoglobin 12.3 12.0 - 15.0 g/dL   HCT 38.1 36.0 - 46.0 %   MCV 82.3 78.0 - 100.0 fL   MCH 26.6 26.0 - 34.0 pg   MCHC 32.3 30.0 - 36.0 g/dL   RDW 15.6 (H) 11.5 - 15.5 %   Platelets 324 150 - 400 K/uL  Basic metabolic panel     Status: Abnormal   Collection Time: 04/07/16 12:04 PM  Result Value Ref Range   Sodium 140 135 - 145 mmol/L   Potassium 3.7 3.5 - 5.1 mmol/L   Chloride 106 101 - 111 mmol/L   CO2 24 22 - 32 mmol/L   Glucose, Bld 112 (H) 65 - 99 mg/dL   BUN 14 6 - 20 mg/dL   Creatinine, Ser 0.84 0.44 - 1.00 mg/dL   Calcium 9.1 8.9 - 10.3 mg/dL   GFR calc non Af Amer >60 >60 mL/min   GFR calc Af Amer >60 >60 mL/min    Comment: (NOTE) The eGFR has been calculated using the CKD EPI equation. This calculation has not been validated in all clinical situations. eGFR's persistently <60 mL/min signify possible Chronic Kidney Disease.    Anion gap 10 5 - 15   No results found.  Review of Systems  Musculoskeletal: Positive for back pain and myalgias.    Blood pressure (!) 154/86, pulse 96, temperature 98 F (36.7 C), temperature source Oral, resp. rate 20, height 5' 5"  (1.651 m), weight 88.5 kg (195 lb), SpO2 98 %. Physical Exam  Constitutional: She is oriented to person, place, and time. She appears well-developed and well-nourished.  HENT:   Head: Normocephalic.  Eyes: Pupils are equal, round, and reactive to light.  Neck: Normal range of motion.  Respiratory: Effort normal.  GI: Soft. Bowel sounds are normal.  Neurological: She is alert and oriented to person, place, and time. She has normal strength. GCS eye subscore is 4. GCS verbal subscore is 5. GCS motor subscore is 6.  Strength 5 out of 5 iliopsoas, quads, hamstrings, gastric, into tibialis, and EHL.     Assessment/Plan 31 or female presents for hardware removal L5-S1  Ardel Jagger P, MD 04/07/2016, 1:15 PM

## 2016-04-07 NOTE — Transfer of Care (Signed)
Immediate Anesthesia Transfer of Care Note  Patient: Angie Olson  Procedure(s) Performed: Procedure(s): HARDWARE REMOVAL (N/A)  Patient Location: PACU  Anesthesia Type:General  Level of Consciousness: awake and alert   Airway & Oxygen Therapy: Patient Spontanous Breathing and Patient connected to face mask oxygen  Post-op Assessment: Report given to RN, Post -op Vital signs reviewed and stable and Patient moving all extremities X 4  Post vital signs: Reviewed and stable  Last Vitals:  Vitals:   04/07/16 1211 04/07/16 1557  BP: (!) 154/86   Pulse: 96 (P) 94  Resp: 20 (P) 17  Temp: 36.7 C (P) 36.5 C    Last Pain:  Vitals:   04/07/16 1211  TempSrc: Oral  PainSc:       Patients Stated Pain Goal: 3 ([id number])  Complications: No apparent anesthesia complications

## 2016-04-07 NOTE — Anesthesia Preprocedure Evaluation (Addendum)
Anesthesia Evaluation  Patient identified by MRN, date of birth, ID band Patient awake    Reviewed: Allergy & Precautions, NPO status , Patient's Chart, lab work & pertinent test results  History of Anesthesia Complications (+) PONV  Airway Mallampati: II  TM Distance: >3 FB Neck ROM: Full    Dental  (+) Dental Advisory Given, Chipped   Pulmonary neg pulmonary ROS,    breath sounds clear to auscultation       Cardiovascular hypertension, Pt. on medications (-) angina Rhythm:Regular Rate:Normal     Neuro/Psych Chronic back pain    GI/Hepatic Neg liver ROS, GERD  Medicated and Controlled,  Endo/Other  Morbid obesity  Renal/GU negative Renal ROS     Musculoskeletal  (+) Arthritis , steroids,    Abdominal (+) + obese,   Peds  Hematology negative hematology ROS (+)   Anesthesia Other Findings   Reproductive/Obstetrics                            Anesthesia Physical Anesthesia Plan  ASA: III  Anesthesia Plan: General   Post-op Pain Management:    Induction: Intravenous  Airway Management Planned: Oral ETT  Additional Equipment:   Intra-op Plan:   Post-operative Plan: Extubation in OR  Informed Consent: I have reviewed the patients History and Physical, chart, labs and discussed the procedure including the risks, benefits and alternatives for the proposed anesthesia with the patient or authorized representative who has indicated his/her understanding and acceptance.   Dental advisory given  Plan Discussed with: CRNA and Surgeon  Anesthesia Plan Comments: (Plan routine monitors GETA)        Anesthesia Quick Evaluation

## 2016-04-07 NOTE — Op Note (Signed)
Preoperative diagnosis: Painful hardware  Postoperative diagnosis: Painful hardware and pseudoarthrosis L5-S1  Procedure: #1 exploration of fusion removal of hardware L5-S1  #2 redo posterior lateral fusion L5-S1 utilizing BMP and osteo-cell pro  Surgeon: Dominica Severin Briley Sulton  Assistant: Marland Kitchen ditty  Anesthesia: Gen.  EBL: Middle  History of present illness: Patient is a very pleasant 66 year old denies a long-standing issues Robaxin previous undergone L4-S1 fusion she developed a pseudoarthrosis at L5-S1 underwent revision posterior lateral fusion L5-S1 and and was doing fairly well however had persistent back pain continued follow-up imaging looked like solid posterior lateral fusion at L5-S1 with stable loose screws at S1. So was felt the most. Persistent back pain could be related to loose hardware so recommend removal of hardware and exploration of her fusion. I extensively went over the risks and benefits of that operation with her as well as perioperative course expectations of outcome and alternatives of surgery and she understood and agreed to proceed forward.  Operative procedure: Patient brought into the or was induced under general anesthesia positioned prone on the Wilson frame her back was prepped and draped in routine sterile fashion. Her old incision was opened up and the scar tissues dissected free medially and quickly identified the right-sided L5 and S1 screws over the left-sided screws were very difficult to identify within scar tissue. So I pulled and AP fluoroscopy and got a landmark and dissected the L5-S1 screws out and I disconnected the knots remove the rods tested the screws it appeared to be at least a partial fusion L5-S1 but there was a trace amount of movement. As patient had already had to operations at L5-S1 14 L5-S1 fusion the other for pseudoarthrosis I elected this point to continue on with the plan of removal of hardware and do a redo posterior lateral fusion so  identified sacral alar and sacrum lateral facet complex and an TPs from L5 to the a lot on both sides and then I aggressively decorticated the laid down and BMP and osteo-cell pro impacted along the posterior laterally overlying the pedicle screw holes. I then spread of vancomycin powder and the wound injected X Burrell the fascia placed a medium Hemovac drain and closed with layers with interrupted Vicryl and a running 4 subcuticular Dermabond benzo and Steri-Strips and sterile dressing was applied patient recovered in stable condition. At the end of case all needle counts sponge counts were correct.

## 2016-04-08 ENCOUNTER — Encounter (HOSPITAL_COMMUNITY): Payer: Self-pay | Admitting: Neurosurgery

## 2016-04-08 MED ORDER — OXYCODONE-ACETAMINOPHEN 5-325 MG PO TABS: 1.0000 | ORAL_TABLET | ORAL | 0 refills | Status: DC | PRN

## 2016-04-08 NOTE — Discharge Instructions (Signed)

## 2016-04-08 NOTE — Discharge Summary (Signed)
Physician Discharge Summary  Patient ID: Angie Olson MRN: [id number] DOB/AGE: 1950-06-22 66 y.o.  Admit date: 04/07/2016 Discharge date: 04/08/2016  Admission Diagnoses:Painful hardware pseudoarthrosis L5-S1  Discharge Diagnoses: Same Active Problems:   Pseudoarthrosis of lumbar spine   Discharged Condition: good  Hospital Course: Patient is admitted hospital underwent reexploration of fusion we will hardware and redo posterior lateral fusion L5-S1 postop patient did very well recovered in the floor on the floor was angling and voiding spontaneous he tolerating her diet stable for discharge home.  Consults: Significant Diagnostic Studies: Treatments: Expiration of fusion we will hardware L5-S1 redo posterior lateral fusion L5-S1 Discharge Exam: Blood pressure 118/75, pulse 85, temperature 98.4 F (36.9 C), resp. rate 18, height 5\' 5"  (1.651 m), weight 88.5 kg (195 lb), SpO2 96 %. Strength 5 out of 5 wound clean dry and intact  Disposition: Home   Allergies as of 04/08/2016      Reactions   Methotrexate Derivatives Other (See Comments)   Causes recurrent infections   Pseudoephedrine Other (See Comments)   Makes jittery    Reglan [metoclopramide] Other (See Comments)   insomnia   Vicodin [hydrocodone-acetaminophen] Other (See Comments)   "Makes me feel weird"      Medication List    TAKE these medications   ALPRAZolam 1 MG tablet Commonly known as:  XANAX Take 0.5-1 mg by mouth at bedtime.   amLODipine 5 MG tablet Commonly known as:  NORVASC Take 5 mg by mouth daily.   atorvastatin 20 MG tablet Commonly known as:  LIPITOR Take 20 mg by mouth daily.   cetirizine 10 MG tablet Commonly known as:  ZYRTEC Take 10 mg by mouth daily.   cyclobenzaprine 10 MG tablet Commonly known as:  FLEXERIL Take 10 mg by mouth 3 (three) times daily as needed. For muscle spasms   esomeprazole 20 MG capsule Commonly known as:  NEXIUM Take 20 mg by mouth daily.    fluticasone 50 MCG/ACT nasal spray Commonly known as:  FLONASE Place 2 sprays into both nostrils daily as needed for allergies.   leflunomide 20 MG tablet Commonly known as:  ARAVA Take 20 mg by mouth daily.   lisinopril 40 MG tablet Commonly known as:  PRINIVIL,ZESTRIL Take 40 mg by mouth daily.   naloxegol oxalate 25 MG Tabs tablet Commonly known as:  MOVANTIK Take 1 tablet (25 mg total) by mouth daily.   nortriptyline 25 MG capsule Commonly known as:  PAMELOR Take 25 mg by mouth at bedtime.   oxyCODONE-acetaminophen 5-325 MG tablet Commonly known as:  PERCOCET/ROXICET Take 1-2 tablets by mouth every 4 (four) hours as needed for pain. What changed:  Another medication with the same name was added. Make sure you understand how and when to take each.   oxyCODONE-acetaminophen 5-325 MG tablet Commonly known as:  PERCOCET/ROXICET Take 1-2 tablets by mouth every 4 (four) hours as needed for moderate pain. What changed:  You were already taking a medication with the same name, and this prescription was added. Make sure you understand how and when to take each.   predniSONE 5 MG tablet Commonly known as:  DELTASONE Take 5 mg by mouth daily. Continuous for rheumatoid arthritis   promethazine 25 MG tablet Commonly known as:  PHENERGAN Take 1 tablet (25 mg total) by mouth every 4 (four) hours as needed for nausea or vomiting.   sucralfate 1 g tablet Commonly known as:  CARAFATE Take 1 g by mouth 2 (two) times daily as needed (indigestion).  traMADol 50 MG tablet Commonly known as:  ULTRAM Take 1-2 tablets (50-100 mg total) by mouth every 6 (six) hours as needed (mild pain). What changed:  when to take this   Vitamin D (Ergocalciferol) 50000 units Caps capsule Commonly known as:  DRISDOL Take 50,000 Units by mouth every Monday.   VOLTAREN 1 % Gel Generic drug:  diclofenac sodium Apply 4 g topically 4 (four) times daily as needed. For pain        Signed: Esdras Delair  P 04/08/2016, 7:57 AM

## 2016-04-08 NOTE — Anesthesia Postprocedure Evaluation (Addendum)
Anesthesia Post Note  Patient: CHAUNDRA WATTERSON  Procedure(s) Performed: Procedure(s) (LRB): HARDWARE REMOVAL (N/A)  Patient location during evaluation: PACU Anesthesia Type: General Level of consciousness: awake and alert Pain management: pain level controlled Vital Signs Assessment: post-procedure vital signs reviewed and stable Respiratory status: spontaneous breathing, nonlabored ventilation, respiratory function stable and patient connected to nasal cannula oxygen Cardiovascular status: blood pressure returned to baseline and stable Postop Assessment: no signs of nausea or vomiting Anesthetic complications: no       Last Vitals:  Vitals:   04/07/16 2254 04/08/16 0412  BP: (!) 143/78 138/85  Pulse: 90 95  Resp: 20 18  Temp: 36.9 C 36.5 C    Last Pain:  Vitals:   04/08/16 0412  TempSrc: Oral  PainSc:                  Effie Berkshire

## 2016-04-08 NOTE — Progress Notes (Signed)
Patient alert and oriented, mae's well, voiding adequate amount of urine, swallowing without difficulty, no c/o pain at time of discharge. Patient discharged home with family. Script and discharged instructions given to patient. Patient and family stated understanding of instructions given. Patient has an appointment with Dr. Cram 

## 2016-05-08 ENCOUNTER — Ambulatory Visit: Payer: Self-pay | Admitting: Orthopedic Surgery

## 2016-05-21 ENCOUNTER — Other Ambulatory Visit (HOSPITAL_COMMUNITY): Payer: Self-pay | Admitting: Emergency Medicine

## 2016-05-21 NOTE — Patient Instructions (Addendum)
Angie Olson  05/21/2016   Your procedure is scheduled on: 05-28-16  Report to [facility]  Entrance take [facility]  elevators to 3rd floor to  [place] at 1:00PM.  Call this number if you have problems the morning of surgery [phone]    Remember: ONLY 1 PERSON MAY GO WITH YOU TO SHORT STAY TO GET  READY MORNING OF [name]  Do not eat food After Midnight. You may have clear liquids from midnight until 10am day of surgery. Nothing by mouth after 10am!!     Take these medicines the morning of surgery with A SIP OF WATER: amlodipine(norvasc), prednisone(deltasone), atorvastatin(lipitor), esomeprazole(nexium), cetirizine(zyrtec), oxycodone as needed, nasal spray as needed               You may not have any metal on your body including hair pins and              piercings  Do not wear jewelry, make-up, lotions, powders or perfumes, deodorant             Do not wear nail polish.  Do not shave  48 hours prior to surgery.              Men may shave face and neck.   Do not bring valuables to the hospital. [place].  Contacts, dentures or bridgework may not be worn into surgery.  Leave suitcase in the car. After surgery it may be brought to your room.                 Please read over the following fact sheets you were given: _____________________________________________________________________                CLEAR LIQUID DIET   Foods Allowed                                                                     Foods Excluded  Coffee and tea, regular and decaf                             liquids that you cannot  Plain Jell-O in any flavor                                             see through such as: Fruit ices (not with fruit pulp)                                     milk, soups, orange juice  Iced Popsicles                                    All solid food Carbonated beverages, regular  and diet  Cranberry, grape and apple juices Sports drinks like Gatorade Lightly seasoned clear broth or consume(fat free) Sugar, honey syrup  Sample Menu Breakfast                                Lunch                                     Supper Cranberry juice                    Beef broth                            Chicken broth Jell-O                                     Grape juice                           Apple juice Coffee or tea                        Jell-O                                      Popsicle                                                Coffee or tea                        Coffee or tea  _____________________________________________________________________  [facility] - Preparing for Surgery Before surgery, you can play an important role.  Because skin is not sterile, your skin needs to be as free of germs as possible.  You can reduce the number of germs on your skin by washing with CHG (chlorahexidine gluconate) soap before surgery.  CHG is an antiseptic cleaner which kills germs and bonds with the skin to continue killing germs even after washing. Please DO NOT use if you have an allergy to CHG or antibacterial soaps.  If your skin becomes reddened/irritated stop using the CHG and inform your nurse when you arrive at Short Stay. Do not shave (including legs and underarms) for at least 48 hours prior to the first CHG shower.  You may shave your face/neck. Please follow these instructions carefully:  1.  Shower with CHG Soap the night before surgery and the  morning of Surgery.  2.  If you choose to wash your hair, wash your hair first as usual with your  normal  shampoo.  3.  After you shampoo, rinse your hair and body thoroughly to remove the  shampoo.                           4.  Use CHG as you would any other liquid soap.  You can apply chg directly  to the skin and wash  Gently with a scrungie or clean  washcloth.  5.  Apply the CHG Soap to your body ONLY FROM THE NECK DOWN.   Do not use on face/ open                           Wound or open sores. Avoid contact with eyes, ears mouth and genitals (private parts).                       Wash face,  Genitals (private parts) with your normal soap.             6.  Wash thoroughly, paying special attention to the area where your surgery  will be performed.  7.  Thoroughly rinse your body with warm water from the neck down.  8.  DO NOT shower/wash with your normal soap after using and rinsing off  the CHG Soap.                9.  Pat yourself dry with a clean towel.            10.  Wear clean pajamas.            11.  Place clean sheets on your bed the night of your first shower and do not  sleep with pets. Day of Surgery : Do not apply any lotions/deodorants the morning of surgery.  Please wear clean clothes to the hospital/surgery center.  FAILURE TO FOLLOW THESE INSTRUCTIONS MAY RESULT IN THE CANCELLATION OF YOUR SURGERY PATIENT SIGNATURE_________________________________  NURSE SIGNATURE__________________________________  ________________________________________________________________________   Angie Olson  An incentive spirometer is a tool that can help keep your lungs clear and active. This tool measures how well you are filling your lungs with each breath. Taking long deep breaths may help reverse or decrease the chance of developing breathing (pulmonary) problems (especially infection) following:  A long period of time when you are unable to move or be active. BEFORE THE PROCEDURE   If the spirometer includes an indicator to show your best effort, your nurse or respiratory therapist will set it to a desired goal.  If possible, sit up straight or lean slightly forward. Try not to slouch.  Hold the incentive spirometer in an upright position. INSTRUCTIONS FOR USE  1. Sit on the edge of your bed if possible, or sit up as far  as you can in bed or on a chair. 2. Hold the incentive spirometer in an upright position. 3. Breathe out normally. 4. Place the mouthpiece in your mouth and seal your lips tightly around it. 5. Breathe in slowly and as deeply as possible, raising the piston or the ball toward the top of the column. 6. Hold your breath for 3-5 seconds or for as long as possible. Allow the piston or ball to fall to the bottom of the column. 7. Remove the mouthpiece from your mouth and breathe out normally. 8. Rest for a few seconds and repeat Steps 1 through 7 at least 10 times every 1-2 hours when you are awake. Take your time and take a few normal breaths between deep breaths. 9. The spirometer may include an indicator to show your best effort. Use the indicator as a goal to work toward during each repetition. 10. After each set of 10 deep breaths, practice coughing to be sure your lungs are clear. If you have an incision (the cut made at the time of  surgery), support your incision when coughing by placing a pillow or rolled up towels firmly against it. Once you are able to get out of bed, walk around indoors and cough well. You may stop using the incentive spirometer when instructed by your caregiver.  RISKS AND COMPLICATIONS  Take your time so you do not get dizzy or light-headed.  If you are in pain, you may need to take or ask for pain medication before doing incentive spirometry. It is harder to take a deep breath if you are having pain. AFTER USE  Rest and breathe slowly and easily.  It can be helpful to keep track of a log of your progress. Your caregiver can provide you with a simple table to help with this. If you are using the spirometer at home, follow these instructions: [place] IF:   You are having difficultly using the spirometer.  You have trouble using the spirometer as often as instructed.  Your pain medication is not giving enough relief while using the spirometer.  You  develop fever of 100.5 F (38.1 C) or higher. SEEK IMMEDIATE MEDICAL CARE IF:   You cough up bloody sputum that had not been present before.  You develop fever of 102 F (38.9 C) or greater.  You develop worsening pain at or near the incision site. MAKE SURE YOU:   Understand these instructions.  Will watch your condition.  Will get help right away if you are not doing well or get worse. Document Released: 07/14/2006 Document Revised: 05/26/2011 Document Reviewed: 09/14/2006 Exit[employer] Patient Information 2014 [employer], [place].   ________________________________________________________________________  WHAT IS A BLOOD TRANSFUSION? Blood Transfusion Information  A transfusion is the replacement of blood or some of its parts. Blood is made up of multiple cells which provide different functions.  Red blood cells carry oxygen and are used for blood loss replacement.  White blood cells fight against infection.  Platelets control bleeding.  Plasma helps clot blood.  Other blood products are available for specialized needs, such as hemophilia or other clotting disorders. BEFORE THE TRANSFUSION  Who gives blood for transfusions?   Healthy volunteers who are fully evaluated to make sure their blood is safe. This is blood bank blood. Transfusion therapy is the safest it has ever been in the practice of medicine. Before blood is taken from a donor, a complete history is taken to make sure that person has no history of diseases nor engages in risky social behavior (examples are intravenous drug use or sexual activity with multiple partners). The donor's travel history is screened to minimize risk of transmitting infections, such as malaria. The donated blood is tested for signs of infectious diseases, such as HIV and hepatitis. The blood is then tested to be sure it is compatible with you in order to minimize the chance of a transfusion reaction. If you or a relative donates blood, this is  often done in anticipation of surgery and is not appropriate for emergency situations. It takes many days to process the donated blood. RISKS AND COMPLICATIONS Although transfusion therapy is very safe and saves many lives, the main dangers of transfusion include:   Getting an infectious disease.  Developing a transfusion reaction. This is an allergic reaction to something in the blood you were given. Every precaution is taken to prevent this. The decision to have a blood transfusion has been considered carefully by your caregiver before blood is given. Blood is not given unless the benefits outweigh the risks. AFTER THE  TRANSFUSION  Right after receiving a blood transfusion, you will usually feel much better and more energetic. This is especially true if your red blood cells have gotten low (anemic). The transfusion raises the level of the red blood cells which carry oxygen, and this usually causes an energy increase.  The nurse administering the transfusion will monitor you carefully for complications. HOME CARE INSTRUCTIONS  No special instructions are needed after a transfusion. You may find your energy is better. Speak with your caregiver about any limitations on activity for underlying diseases you may have. SEEK MEDICAL CARE IF:   Your condition is not improving after your transfusion.  You develop redness or irritation at the intravenous (IV) site. SEEK IMMEDIATE MEDICAL CARE IF:  Any of the following symptoms occur over the next 12 hours:  Shaking chills.  You have a temperature by mouth above 102 F (38.9 C), not controlled by medicine.  Chest, back, or muscle pain.  People around you feel you are not acting correctly or are confused.  Shortness of breath or difficulty breathing.  Dizziness and fainting.  You get a rash or develop hives.  You have a decrease in urine output.  Your urine turns a dark color or changes to pink, red, or brown. Any of the following  symptoms occur over the next 10 days:  You have a temperature by mouth above 102 F (38.9 C), not controlled by medicine.  Shortness of breath.  Weakness after normal activity.  The white part of the eye turns yellow (jaundice).  You have a decrease in the amount of urine or are urinating less often.  Your urine turns a dark color or changes to pink, red, or brown. Document Released: 02/29/2000 Document Revised: 05/26/2011 Document Reviewed: 10/18/2007 [facility] Patient Information 2014 [place], [place].  _______________________________________________________________________

## 2016-05-21 NOTE — Progress Notes (Signed)
LOV Dr Ardeth Perfect 07-09-15 on chart

## 2016-05-22 ENCOUNTER — Encounter (HOSPITAL_COMMUNITY): Payer: Self-pay

## 2016-05-22 ENCOUNTER — Encounter (INDEPENDENT_AMBULATORY_CARE_PROVIDER_SITE_OTHER): Payer: Self-pay

## 2016-05-22 ENCOUNTER — Encounter (HOSPITAL_COMMUNITY)
Admission: RE | Admit: 2016-05-22 | Discharge: 2016-05-22 | Disposition: A | Discharge status: Home / Self Care | Payer: Medicare Other | Source: Ambulatory Visit | Attending: Orthopedic Surgery | Admitting: Orthopedic Surgery

## 2016-05-22 DIAGNOSIS — M2351 Chronic instability of knee, right knee: Secondary | ICD-10-CM | POA: Diagnosis not present

## 2016-05-22 DIAGNOSIS — Z01818 Encounter for other preprocedural examination: Secondary | ICD-10-CM | POA: Insufficient documentation

## 2016-05-22 LAB — COMPREHENSIVE METABOLIC PANEL
ALK PHOS: 72 U/L (ref 38–126)
ALT: 30 U/L (ref 14–54)
AST: 30 U/L (ref 15–41)
Albumin: 3.9 g/dL (ref 3.5–5.0)
Anion gap: 6 (ref 5–15)
BILIRUBIN TOTAL: 0.4 mg/dL (ref 0.3–1.2)
BUN: 15 mg/dL (ref 6–20)
CALCIUM: 9.4 mg/dL (ref 8.9–10.3)
CO2: 27 mmol/L (ref 22–32)
CREATININE: 0.94 mg/dL (ref 0.44–1.00)
Chloride: 104 mmol/L (ref 101–111)
Glucose, Bld: 146 mg/dL — ABNORMAL HIGH (ref 65–99)
Potassium: 4.5 mmol/L (ref 3.5–5.1)
Sodium: 137 mmol/L (ref 135–145)
TOTAL PROTEIN: 6.8 g/dL (ref 6.5–8.1)

## 2016-05-22 LAB — PROTIME-INR
INR: 0.99
PROTHROMBIN TIME: 13.1 s (ref 11.4–15.2)

## 2016-05-22 LAB — CBC
HCT: 38 % (ref 36.0–46.0)
HEMOGLOBIN: 12.2 g/dL (ref 12.0–15.0)
MCH: 26 pg (ref 26.0–34.0)
MCHC: 32.1 g/dL (ref 30.0–36.0)
MCV: 81 fL (ref 78.0–100.0)
Platelets: 392 10*3/uL (ref 150–400)
RBC: 4.69 MIL/uL (ref 3.87–5.11)
RDW: 15.4 % (ref 11.5–15.5)
WBC: 9.3 10*3/uL (ref 4.0–10.5)

## 2016-05-22 LAB — SURGICAL PCR SCREEN
MRSA, PCR: NEGATIVE
Staphylococcus aureus: NEGATIVE

## 2016-05-22 LAB — APTT: aPTT: 28 seconds (ref 24–36)

## 2016-05-22 NOTE — Progress Notes (Signed)
Spoke with [place] front desk and consent and OR schedule are an ok match. No changes to be made

## 2016-05-27 ENCOUNTER — Ambulatory Visit: Payer: Self-pay | Admitting: Orthopedic Surgery

## 2016-05-27 NOTE — H&P (Signed)
Angie Olson DOB: May 21, 1950 Married / Language: Cleophus Molt / Race: White Female Date of Admission:  05/28/2016 CC:  Right Knee Instability History of Present Illness The patient is a 66 year old female who comes in for a preoperative History and Physical. The patient is scheduled for a right right knee polyethylene revision to be performed by Dr. Dione Plover. Aluisio, MD at [facility] on 05/28/2016. The patient is a 66 year old female who presented for follow up of their knee. Symptoms reported include: pain, aching and difficulty ambulating. The patient feels that they are doing poorly and report their pain level to be moderate to severe. The following medication has been used for pain control: Morphine. The patient has not gotten any relief of their symptoms with Cortisone injections. She states that the knee felt worse for over a week after the injection, and still bothers her more each day. She states that her right hip is bothering her more as well. In regards to her right knee, she is wanting to get scheduled for a poly exchange on the right knee. Her right knee is what has given her the most trouble. The right hip hurts, but not as bad as the knee. She has some instability in that total knee. We did a pes bursa injection because she had some symptoms consistent with bursitis, but it did help. Pain is worse getting up from a sitting position and doing stairs. We previously had discussed a possible polyethylene revision. She tried bracing, it did not help. I do feel we can get her more stable for sure doing a poly revision and with stability her swelling and pain should also improve. The pain part would be less predictable, but the stability part would be very predictable. We will go ahead and set her up for polyethylene versus total knee revision. They have been treated conservatively in the past for the above stated problem and despite conservative measures, they continue to have progressive  pain and severe functional limitations and dysfunction. They have failed non-operative management including home exercise, medications, bracing and injections. It is felt that they would benefit from undergoing revision of the total joint replacement. Risks and benefits of the procedure have been discussed with the patient and they elect to proceed with surgery. There are no active contraindications to surgery such as ongoing infection or rapidly progressive neurological disease.   Problem List/Past Medical Shoulder impingement, left (M75.42)  Status post left hip replacement (X38.182)  Shoulder pain (M25.519)  S/P Knee Arthroscopy (Z47.89)  Primary osteoarthritis of right hip (M16.11)  Status post total right knee replacement (X93.716)  Hypercholesterolemia  Irritable bowel syndrome  Rheumatoid Arthritis  Cerebrovascular Accident  Mini Stroke High blood pressure  Skin Cancer  Melanoma Carotid Arterial Disease  Shingles  Hiatal Hernia  Gastroesophageal Reflux Disease  Urinary Tract Infection  Measles  Mumps  Menopause   Allergies  Dust  Mold Spores  No Known Drug Allergies   Family History Hypertension  mother, father, sister and brother mother and father Congestive Heart Failure  First Degree Relatives. father Osteoarthritis  mother Chronic Obstructive Lung Disease  mother Cancer  brother and grandfather mothers side Diabetes Mellitus  father grandmother fathers side Depression  sister Heart Disease  First Degree Relatives. mother, father, sister and brother  Social History Current work status  retired Tobacco use  Never smoker. never smoker Previously in rehab  no Alcohol use  social only occasionally per week Drug/Alcohol Rehab (Currently)  no Exercise  Exercises daily; does running / walking Exercises daily; does running / walking and gym / weights Drug/Alcohol Rehab (Previously)  no Children  2 Illicit drug use  no Living  situation  live with spouse Marital status  married Number of flights of stairs before winded  2-3 Pain Contract  no Tobacco / smoke exposure  no Post-Surgical Plans   Medication History Flonase Active. ZyrTEC Allergy (10MG  Capsule, Oral) Active. NexIUM (40MG  Capsule DR, Oral) Active. Oxycodone Active. Cyclobenzaprine Active. ALPRAZolam (1MG  Tablet, Oral) Active. (1/2 qhs) Leflunomide (20MG  Tablet, Oral) Active. Atorvastatin Calcium (20MG  Tablet, Oral) Active. AmLODIPine Besylate (5MG  Tablet, Oral) Active. Sucralfate Active. Tramadol Active. Vitamin D Active. PredniSONE (5MG  Tablet, Oral) Active. Nortriptyline HCl (25MG  Capsule, Oral) Active. Lisinopril (40MG  Tablet, Oral) Active.   Past Surgical History  Spinal Fusion  lower back five times Total Hip Replacement - Left [04/25/2015]: Tubal Ligation  Other Orthopaedic Surgery  Gallbladder Surgery  laporoscopic Spinal Surgery  Ankle Surgery  left Other Surgery  Right hand trigger finger twice and left hand thumb fused Colon Polyp Removal - Colonoscopy  Arthroscopy of Shoulder  left Total Knee Replacement - Right  Date: 07/2013.   Review of Systems General Present- Fatigue and Night Sweats. Not Present- Chills, Fever, Memory Loss, Weight Gain and Weight Loss. Skin Not Present- Eczema, Hives, Itching, Lesions and Rash. HEENT Not Present- Dentures, Double Vision, Headache, Hearing Loss, Tinnitus and Visual Loss. Respiratory Not Present- Allergies, Chronic Cough, Coughing up blood, Shortness of breath at rest and Shortness of breath with exertion. Cardiovascular Not Present- Chest Pain, Difficulty Breathing Lying Down, Murmur, Palpitations, Racing/skipping heartbeats and Swelling. Gastrointestinal Present- Diarrhea, Heartburn and Nausea. Not Present- Abdominal Pain, Bloody Stool, Constipation, Difficulty Swallowing, Jaundice, Loss of appetitie and Vomiting. Female Genitourinary Not Present-  Blood in Urine, Discharge, Flank Pain, Incontinence, Painful Urination, Urgency, Urinary frequency, Urinary Retention, Urinating at Night and Weak urinary stream. Musculoskeletal Present- Back Pain and Joint Pain. Not Present- Joint Swelling, Morning Stiffness, Muscle Pain, Muscle Weakness and Spasms. Neurological Not Present- Blackout spells, Difficulty with balance, Dizziness, Paralysis, Tremor and Weakness. Psychiatric Not Present- Insomnia.  Vitals Weight: 190 lb Height: 65in Weight was reported by patient. Height was reported by patient. Body Surface Area: 1.94 m Body Mass Index: 31.62 kg/m  Pulse: 96 (Regular)  BP: 138/68 (Sitting, Left Arm, Standard)  Physical Exam General Mental Status -Alert, cooperative and good historian. General Appearance-pleasant, Not in acute distress. Orientation-Oriented X3. Build & Nutrition-Well nourished and Well developed.  Head and Neck Head-normocephalic, atraumatic . Neck Global Assessment - supple, no bruit auscultated on the right, no bruit auscultated on the left.  Eye Vision-Wears corrective lenses(readers only). Pupil - Bilateral-Regular and Round. Motion - Bilateral-EOMI.  Chest and Lung Exam Auscultation Breath sounds - clear at anterior chest wall and clear at posterior chest wall. Adventitious sounds - No Adventitious sounds.  Cardiovascular Auscultation Rhythm - Regular rate and rhythm. Heart Sounds - S1 WNL and S2 WNL. Murmurs & Other Heart Sounds - Auscultation of the heart reveals - No Murmurs.  Abdomen Palpation/Percussion Tenderness - Abdomen is non-tender to palpation. Rigidity (guarding) - Abdomen is soft. Auscultation Auscultation of the abdomen reveals - Bowel sounds normal.  Female Genitourinary Note: Not done, not pertinent to present illness   Musculoskeletal Note: She is in no distress. Her right knee shows no effusion. She hyperextends a few degrees and flexes to about 115.  She does have some varus, valgus and AP laxity.  Assessment & Plan Status post total right  knee replacement (B01.751) Unstable Right Total Knee Arthroplasty  Note:Surgical Plans: Right Total Knee Poly Revision  Disposition: Home, Wants to start with HHPT (out of [place]).  PCP: Dr. Velna Hatchet  Topical TXA - History of Stroke  Anesthesia Issues: Nausea in the past  Patient was instructed on what medications to stop prior to surgery.  Signed electronically by Ok Edwards, III PA-C

## 2016-05-28 ENCOUNTER — Inpatient Hospital Stay (HOSPITAL_COMMUNITY)
Admission: RE | Admit: 2016-05-28 | Discharge: 2016-05-30 | DRG: 489 | Disposition: A | Discharge status: Home Health | Payer: Medicare Other | Source: Ambulatory Visit | Attending: Orthopedic Surgery | Admitting: Orthopedic Surgery

## 2016-05-28 ENCOUNTER — Inpatient Hospital Stay (HOSPITAL_COMMUNITY): Payer: Medicare Other | Admitting: Certified Registered Nurse Anesthetist

## 2016-05-28 ENCOUNTER — Encounter (HOSPITAL_COMMUNITY)
Admission: RE | Disposition: A | Discharge status: Home Health | Payer: Self-pay | Source: Ambulatory Visit | Attending: Orthopedic Surgery

## 2016-05-28 ENCOUNTER — Encounter (HOSPITAL_COMMUNITY): Payer: Self-pay | Admitting: *Deleted

## 2016-05-28 DIAGNOSIS — J42 Unspecified chronic bronchitis: Secondary | ICD-10-CM | POA: Diagnosis present

## 2016-05-28 DIAGNOSIS — Z888 Allergy status to other drugs, medicaments and biological substances status: Secondary | ICD-10-CM

## 2016-05-28 DIAGNOSIS — Z8673 Personal history of transient ischemic attack (TIA), and cerebral infarction without residual deficits: Secondary | ICD-10-CM | POA: Diagnosis not present

## 2016-05-28 DIAGNOSIS — E7800 Pure hypercholesterolemia, unspecified: Secondary | ICD-10-CM | POA: Diagnosis present

## 2016-05-28 DIAGNOSIS — I2510 Atherosclerotic heart disease of native coronary artery without angina pectoris: Secondary | ICD-10-CM | POA: Diagnosis present

## 2016-05-28 DIAGNOSIS — K219 Gastro-esophageal reflux disease without esophagitis: Secondary | ICD-10-CM | POA: Diagnosis present

## 2016-05-28 DIAGNOSIS — M069 Rheumatoid arthritis, unspecified: Secondary | ICD-10-CM | POA: Diagnosis present

## 2016-05-28 DIAGNOSIS — Z96642 Presence of left artificial hip joint: Secondary | ICD-10-CM | POA: Diagnosis present

## 2016-05-28 DIAGNOSIS — Z7952 Long term (current) use of systemic steroids: Secondary | ICD-10-CM

## 2016-05-28 DIAGNOSIS — Z91048 Other nonmedicinal substance allergy status: Secondary | ICD-10-CM | POA: Diagnosis not present

## 2016-05-28 DIAGNOSIS — I10 Essential (primary) hypertension: Secondary | ICD-10-CM | POA: Diagnosis present

## 2016-05-28 DIAGNOSIS — E669 Obesity, unspecified: Secondary | ICD-10-CM | POA: Diagnosis present

## 2016-05-28 DIAGNOSIS — Z8261 Family history of arthritis: Secondary | ICD-10-CM | POA: Diagnosis not present

## 2016-05-28 DIAGNOSIS — M1611 Unilateral primary osteoarthritis, right hip: Secondary | ICD-10-CM | POA: Diagnosis present

## 2016-05-28 DIAGNOSIS — Z79891 Long term (current) use of opiate analgesic: Secondary | ICD-10-CM

## 2016-05-28 DIAGNOSIS — Z981 Arthrodesis status: Secondary | ICD-10-CM | POA: Diagnosis not present

## 2016-05-28 DIAGNOSIS — F419 Anxiety disorder, unspecified: Secondary | ICD-10-CM | POA: Diagnosis present

## 2016-05-28 DIAGNOSIS — Z79899 Other long term (current) drug therapy: Secondary | ICD-10-CM

## 2016-05-28 DIAGNOSIS — Y792 Prosthetic and other implants, materials and accessory orthopedic devices associated with adverse incidents: Secondary | ICD-10-CM | POA: Diagnosis present

## 2016-05-28 DIAGNOSIS — T84022A Instability of internal right knee prosthesis, initial encounter: Secondary | ICD-10-CM | POA: Diagnosis present

## 2016-05-28 DIAGNOSIS — Z885 Allergy status to narcotic agent status: Secondary | ICD-10-CM | POA: Diagnosis not present

## 2016-05-28 DIAGNOSIS — Z96659 Presence of unspecified artificial knee joint: Secondary | ICD-10-CM

## 2016-05-28 DIAGNOSIS — T84018S Broken internal joint prosthesis, other site, sequela: Secondary | ICD-10-CM

## 2016-05-28 DIAGNOSIS — Z6832 Body mass index (BMI) 32.0-32.9, adult: Secondary | ICD-10-CM | POA: Diagnosis not present

## 2016-05-28 DIAGNOSIS — T84018A Broken internal joint prosthesis, other site, initial encounter: Secondary | ICD-10-CM

## 2016-05-28 HISTORY — DX: Presence of unspecified artificial knee joint: T84.018A

## 2016-05-28 HISTORY — PX: TOTAL KNEE ARTHROPLASTY WITH REVISION COMPONENTS: SHX6198

## 2016-05-28 LAB — TYPE AND SCREEN
ABO/RH(D): A NEG
Antibody Screen: NEGATIVE

## 2016-05-28 SURGERY — TOTAL KNEE ARTHROPLASTY WITH REVISION COMPONENTS
Anesthesia: Regional | Site: Knee | Laterality: Right

## 2016-05-28 MED ORDER — HYDROMORPHONE HCL 1 MG/ML IJ SOLN
0.2500 mg | INTRAMUSCULAR | Status: DC | PRN
  Administered 2016-05-28 (×6): 0.5 mg via INTRAVENOUS

## 2016-05-28 MED ORDER — PROMETHAZINE HCL 25 MG/ML IJ SOLN: 6.2500 mg | INTRAMUSCULAR | Status: DC | PRN

## 2016-05-28 MED ORDER — PREDNISONE 5 MG PO TABS
5.0000 mg | ORAL_TABLET | Freq: Two times a day (BID) | ORAL | Status: DC
  Administered 2016-05-30: 10:00:00 5 mg via ORAL
  Filled 2016-05-28: qty 1

## 2016-05-28 MED ORDER — LORATADINE 10 MG PO TABS
10.0000 mg | ORAL_TABLET | Freq: Every day | ORAL | Status: DC
  Administered 2016-05-29 – 2016-05-30 (×2): 10 mg via ORAL
  Filled 2016-05-28 (×2): qty 1

## 2016-05-28 MED ORDER — POLYETHYLENE GLYCOL 3350 17 G PO PACK: 17.0000 g | PACK | Freq: Every day | ORAL | Status: DC | PRN

## 2016-05-28 MED ORDER — SODIUM CHLORIDE 0.9 % IJ SOLN
INTRAMUSCULAR | Status: AC
  Filled 2016-05-28: qty 50

## 2016-05-28 MED ORDER — FLEET ENEMA 7-19 GM/118ML RE ENEM: 1.0000 | ENEMA | Freq: Once | RECTAL | Status: DC | PRN

## 2016-05-28 MED ORDER — FENTANYL CITRATE (PF) 100 MCG/2ML IJ SOLN
50.0000 ug | INTRAMUSCULAR | Status: DC | PRN
  Administered 2016-05-28: 100 ug via INTRAVENOUS

## 2016-05-28 MED ORDER — DEXAMETHASONE SODIUM PHOSPHATE 10 MG/ML IJ SOLN
10.0000 mg | Freq: Once | INTRAMUSCULAR | Status: AC
  Administered 2016-05-28: 10 mg via INTRAVENOUS

## 2016-05-28 MED ORDER — PROPOFOL 10 MG/ML IV BOLUS
INTRAVENOUS | Status: AC
  Filled 2016-05-28: qty 40

## 2016-05-28 MED ORDER — HYDROMORPHONE HCL 2 MG/ML IJ SOLN
INTRAMUSCULAR | Status: AC
  Filled 2016-05-28: qty 1

## 2016-05-28 MED ORDER — FLUTICASONE PROPIONATE 50 MCG/ACT NA SUSP
2.0000 | Freq: Every day | NASAL | Status: DC | PRN
  Filled 2016-05-28: qty 16

## 2016-05-28 MED ORDER — CEFAZOLIN SODIUM-DEXTROSE 2-4 GM/100ML-% IV SOLN
2.0000 g | INTRAVENOUS | Status: AC
  Administered 2016-05-28: 2 g via INTRAVENOUS
  Filled 2016-05-28: qty 100

## 2016-05-28 MED ORDER — ONDANSETRON HCL 4 MG/2ML IJ SOLN
INTRAMUSCULAR | Status: DC | PRN
  Administered 2016-05-28: 4 mg via INTRAVENOUS

## 2016-05-28 MED ORDER — PREDNISONE 5 MG PO TABS
10.0000 mg | ORAL_TABLET | Freq: Two times a day (BID) | ORAL | Status: AC
  Administered 2016-05-29 (×2): 10 mg via ORAL
  Filled 2016-05-28 (×2): qty 2

## 2016-05-28 MED ORDER — PREDNISONE 5 MG PO TABS: 5.0000 mg | ORAL_TABLET | Freq: Every day | ORAL | Status: DC

## 2016-05-28 MED ORDER — METHOCARBAMOL 500 MG PO TABS
500.0000 mg | ORAL_TABLET | Freq: Four times a day (QID) | ORAL | Status: DC | PRN
  Administered 2016-05-29 (×3): 500 mg via ORAL
  Filled 2016-05-28 (×4): qty 1

## 2016-05-28 MED ORDER — ONDANSETRON HCL 4 MG PO TABS
4.0000 mg | ORAL_TABLET | Freq: Four times a day (QID) | ORAL | Status: DC | PRN
  Administered 2016-05-29: 4 mg via ORAL
  Filled 2016-05-28: qty 1

## 2016-05-28 MED ORDER — ONDANSETRON HCL 4 MG/2ML IJ SOLN: 4.0000 mg | Freq: Four times a day (QID) | INTRAMUSCULAR | Status: DC | PRN

## 2016-05-28 MED ORDER — CEFAZOLIN SODIUM-DEXTROSE 2-4 GM/100ML-% IV SOLN
2.0000 g | Freq: Four times a day (QID) | INTRAVENOUS | Status: AC
  Administered 2016-05-28 – 2016-05-29 (×2): 2 g via INTRAVENOUS
  Filled 2016-05-28 (×2): qty 100

## 2016-05-28 MED ORDER — ACETAMINOPHEN 10 MG/ML IV SOLN
1000.0000 mg | Freq: Once | INTRAVENOUS | Status: AC
  Administered 2016-05-28: 1000 mg via INTRAVENOUS
  Filled 2016-05-28: qty 100

## 2016-05-28 MED ORDER — AMLODIPINE BESYLATE 5 MG PO TABS
5.0000 mg | ORAL_TABLET | Freq: Every day | ORAL | Status: DC
  Administered 2016-05-29 – 2016-05-30 (×2): 5 mg via ORAL
  Filled 2016-05-28 (×2): qty 1

## 2016-05-28 MED ORDER — TRANEXAMIC ACID 1000 MG/10ML IV SOLN
INTRAVENOUS | Status: DC | PRN
  Administered 2016-05-28: 2000 mg via TOPICAL

## 2016-05-28 MED ORDER — SODIUM CHLORIDE 0.9 % IV SOLN
INTRAVENOUS | Status: DC
  Administered 2016-05-28: 23:00:00 via INTRAVENOUS

## 2016-05-28 MED ORDER — PHENOL 1.4 % MT LIQD: 1.0000 | OROMUCOSAL | Status: DC | PRN

## 2016-05-28 MED ORDER — SUCRALFATE 1 G PO TABS
1.0000 g | ORAL_TABLET | Freq: Two times a day (BID) | ORAL | Status: DC | PRN
  Administered 2016-05-29: 1 g via ORAL
  Filled 2016-05-28: qty 1

## 2016-05-28 MED ORDER — DIPHENHYDRAMINE HCL 50 MG/ML IJ SOLN
12.5000 mg | Freq: Once | INTRAMUSCULAR | Status: AC
  Administered 2016-05-28: 12.5 mg via INTRAVENOUS

## 2016-05-28 MED ORDER — METHOCARBAMOL 1000 MG/10ML IJ SOLN
500.0000 mg | Freq: Four times a day (QID) | INTRAVENOUS | Status: DC | PRN
  Administered 2016-05-28: 500 mg via INTRAVENOUS
  Filled 2016-05-28: qty 550
  Filled 2016-05-28: qty 5

## 2016-05-28 MED ORDER — ALPRAZOLAM 0.5 MG PO TABS
0.5000 mg | ORAL_TABLET | Freq: Every day | ORAL | Status: DC
  Administered 2016-05-28 – 2016-05-29 (×2): 0.5 mg via ORAL
  Filled 2016-05-28 (×2): qty 1

## 2016-05-28 MED ORDER — PROPOFOL 10 MG/ML IV BOLUS
INTRAVENOUS | Status: AC
  Filled 2016-05-28: qty 20

## 2016-05-28 MED ORDER — BUPIVACAINE IN DEXTROSE 0.75-8.25 % IT SOLN
INTRATHECAL | Status: DC | PRN
  Administered 2016-05-28: 1.6 mL via INTRATHECAL

## 2016-05-28 MED ORDER — MEPERIDINE HCL 50 MG/ML IJ SOLN: 6.2500 mg | INTRAMUSCULAR | Status: DC | PRN

## 2016-05-28 MED ORDER — MORPHINE SULFATE (PF) 4 MG/ML IV SOLN
1.0000 mg | INTRAVENOUS | Status: DC | PRN
  Administered 2016-05-28 – 2016-05-30 (×3): 1 mg via INTRAVENOUS
  Filled 2016-05-28 (×3): qty 1

## 2016-05-28 MED ORDER — OXYCODONE HCL 5 MG PO TABS
5.0000 mg | ORAL_TABLET | ORAL | Status: DC | PRN
  Administered 2016-05-28: 5 mg via ORAL
  Administered 2016-05-28: 23:00:00 10 mg via ORAL
  Administered 2016-05-29 (×2): 15 mg via ORAL
  Administered 2016-05-29: 22:00:00 10 mg via ORAL
  Administered 2016-05-29 (×3): 15 mg via ORAL
  Filled 2016-05-28: qty 3
  Filled 2016-05-28: qty 2
  Filled 2016-05-28: qty 3
  Filled 2016-05-28 (×2): qty 1
  Filled 2016-05-28 (×2): qty 3
  Filled 2016-05-28: qty 2
  Filled 2016-05-28 (×2): qty 3

## 2016-05-28 MED ORDER — BUPIVACAINE LIPOSOME 1.3 % IJ SUSP
INTRAMUSCULAR | Status: DC | PRN
  Administered 2016-05-28: 20 mL

## 2016-05-28 MED ORDER — PREDNISONE 20 MG PO TABS
20.0000 mg | ORAL_TABLET | Freq: Once | ORAL | Status: AC
  Administered 2016-05-28: 20:00:00 20 mg via ORAL
  Filled 2016-05-28: qty 1

## 2016-05-28 MED ORDER — LACTATED RINGERS IV SOLN
INTRAVENOUS | Status: DC
  Administered 2016-05-28 (×2): via INTRAVENOUS

## 2016-05-28 MED ORDER — ACETAMINOPHEN 500 MG PO TABS
1000.0000 mg | ORAL_TABLET | Freq: Four times a day (QID) | ORAL | Status: AC
  Administered 2016-05-28 – 2016-05-29 (×4): 1000 mg via ORAL
  Filled 2016-05-28 (×4): qty 2

## 2016-05-28 MED ORDER — NORTRIPTYLINE HCL 25 MG PO CAPS
25.0000 mg | ORAL_CAPSULE | Freq: Every day | ORAL | Status: DC
  Administered 2016-05-28 – 2016-05-29 (×2): 25 mg via ORAL
  Filled 2016-05-28 (×2): qty 1

## 2016-05-28 MED ORDER — SODIUM CHLORIDE 0.9 % IR SOLN
Status: DC | PRN
  Administered 2016-05-28: 1000 mL

## 2016-05-28 MED ORDER — ONDANSETRON HCL 4 MG/2ML IJ SOLN
INTRAMUSCULAR | Status: AC
  Filled 2016-05-28: qty 2

## 2016-05-28 MED ORDER — MENTHOL 3 MG MT LOZG: 1.0000 | LOZENGE | OROMUCOSAL | Status: DC | PRN

## 2016-05-28 MED ORDER — 0.9 % SODIUM CHLORIDE (POUR BTL) OPTIME
TOPICAL | Status: DC | PRN
  Administered 2016-05-28: 1000 mL

## 2016-05-28 MED ORDER — TRANEXAMIC ACID 1000 MG/10ML IV SOLN
2000.0000 mg | Freq: Once | INTRAVENOUS | Status: DC
  Filled 2016-05-28: qty 20

## 2016-05-28 MED ORDER — ATORVASTATIN CALCIUM 20 MG PO TABS
20.0000 mg | ORAL_TABLET | Freq: Every day | ORAL | Status: DC
  Administered 2016-05-29 – 2016-05-30 (×2): 20 mg via ORAL
  Filled 2016-05-28 (×2): qty 1

## 2016-05-28 MED ORDER — SODIUM CHLORIDE 0.9 % IJ SOLN
INTRAMUSCULAR | Status: DC | PRN
  Administered 2016-05-28: 30 mL

## 2016-05-28 MED ORDER — PHENYLEPHRINE HCL 10 MG/ML IJ SOLN
INTRAMUSCULAR | Status: DC | PRN
  Administered 2016-05-28 (×4): 80 ug via INTRAVENOUS

## 2016-05-28 MED ORDER — PROMETHAZINE HCL 25 MG PO TABS: 25.0000 mg | ORAL_TABLET | ORAL | Status: DC | PRN

## 2016-05-28 MED ORDER — ACETAMINOPHEN 325 MG PO TABS: 650.0000 mg | ORAL_TABLET | Freq: Four times a day (QID) | ORAL | Status: DC | PRN

## 2016-05-28 MED ORDER — DIPHENHYDRAMINE HCL 50 MG/ML IJ SOLN
INTRAMUSCULAR | Status: AC
  Filled 2016-05-28: qty 1

## 2016-05-28 MED ORDER — HYDROMORPHONE HCL 1 MG/ML IJ SOLN: 0.2500 mg | INTRAMUSCULAR | Status: DC | PRN

## 2016-05-28 MED ORDER — DIPHENHYDRAMINE HCL 12.5 MG/5ML PO ELIX
12.5000 mg | ORAL_SOLUTION | ORAL | Status: DC | PRN
  Administered 2016-05-30: 25 mg via ORAL
  Filled 2016-05-28: qty 10

## 2016-05-28 MED ORDER — PROPOFOL 500 MG/50ML IV EMUL
INTRAVENOUS | Status: DC | PRN
  Administered 2016-05-28: 100 ug/kg/min via INTRAVENOUS

## 2016-05-28 MED ORDER — ACETAMINOPHEN 650 MG RE SUPP
650.0000 mg | Freq: Four times a day (QID) | RECTAL | Status: DC | PRN
Start: 2016-05-29 — End: 2016-05-30

## 2016-05-28 MED ORDER — DOCUSATE SODIUM 100 MG PO CAPS
100.0000 mg | ORAL_CAPSULE | Freq: Two times a day (BID) | ORAL | Status: DC
  Administered 2016-05-28 – 2016-05-30 (×4): 100 mg via ORAL
  Filled 2016-05-28 (×4): qty 1

## 2016-05-28 MED ORDER — ASPIRIN EC 325 MG PO TBEC
325.0000 mg | DELAYED_RELEASE_TABLET | Freq: Two times a day (BID) | ORAL | Status: DC
  Administered 2016-05-29 – 2016-05-30 (×3): 325 mg via ORAL
  Filled 2016-05-28 (×3): qty 1

## 2016-05-28 MED ORDER — DEXAMETHASONE SODIUM PHOSPHATE 10 MG/ML IJ SOLN
INTRAMUSCULAR | Status: AC
  Filled 2016-05-28: qty 1

## 2016-05-28 MED ORDER — BISACODYL 10 MG RE SUPP: 10.0000 mg | Freq: Every day | RECTAL | Status: DC | PRN

## 2016-05-28 MED ORDER — PANTOPRAZOLE SODIUM 40 MG PO TBEC
40.0000 mg | DELAYED_RELEASE_TABLET | Freq: Every day | ORAL | Status: DC
  Administered 2016-05-29: 08:00:00 40 mg via ORAL
  Filled 2016-05-28: qty 1

## 2016-05-28 MED ORDER — FENTANYL CITRATE (PF) 100 MCG/2ML IJ SOLN
INTRAMUSCULAR | Status: AC
  Filled 2016-05-28: qty 2

## 2016-05-28 MED ORDER — CHLORHEXIDINE GLUCONATE 4 % EX LIQD
60.0000 mL | Freq: Once | CUTANEOUS | Status: AC
  Administered 2016-05-28: 4 via TOPICAL

## 2016-05-28 MED ORDER — PROPOFOL 10 MG/ML IV BOLUS
INTRAVENOUS | Status: AC
Start: 2016-05-28 — End: 2016-05-28
  Filled 2016-05-28: qty 40

## 2016-05-28 MED ORDER — BUPIVACAINE LIPOSOME 1.3 % IJ SUSP
20.0000 mL | Freq: Once | INTRAMUSCULAR | Status: DC
  Filled 2016-05-28: qty 20

## 2016-05-28 SURGICAL SUPPLY — 47 items
BAG DECANTER FOR FLEXI CONT (MISCELLANEOUS) ×2 IMPLANT
BAG SPEC THK2 15X12 ZIP CLS (MISCELLANEOUS) ×1
BAG ZIPLOCK 12X15 (MISCELLANEOUS) ×1 IMPLANT
BANDAGE ACE 6X5 VEL STRL LF (GAUZE/BANDAGES/DRESSINGS) ×2 IMPLANT
BLADE SAG 18X100X1.27 (BLADE) ×2 IMPLANT
BLADE SAW SGTL 11.0X1.19X90.0M (BLADE) ×2 IMPLANT
CLOTH BEACON ORANGE TIMEOUT ST (SAFETY) ×2 IMPLANT
CUFF TOURN SGL QUICK 34 (TOURNIQUET CUFF) ×2
CUFF TRNQT CYL 34X4X40X1 (TOURNIQUET CUFF) ×1 IMPLANT
DRAPE U-SHAPE 47X51 STRL (DRAPES) ×2 IMPLANT
DRSG ADAPTIC 3X8 NADH LF (GAUZE/BANDAGES/DRESSINGS) ×2 IMPLANT
DRSG PAD ABDOMINAL 8X10 ST (GAUZE/BANDAGES/DRESSINGS) ×2 IMPLANT
DURAPREP 26ML APPLICATOR (WOUND CARE) ×2 IMPLANT
ELECT REM PT RETURN 9FT ADLT (ELECTROSURGICAL) ×2
ELECTRODE REM PT RTRN 9FT ADLT (ELECTROSURGICAL) ×1 IMPLANT
EVACUATOR 1/8 PVC DRAIN (DRAIN) ×2 IMPLANT
GAUZE SPONGE 4X4 12PLY STRL (GAUZE/BANDAGES/DRESSINGS) ×2 IMPLANT
GLOVE BIO SURGEON STRL SZ7.5 (GLOVE) IMPLANT
GLOVE BIO SURGEON STRL SZ8 (GLOVE) ×2 IMPLANT
GLOVE BIOGEL PI IND STRL 8 (GLOVE) ×1 IMPLANT
GLOVE BIOGEL PI INDICATOR 8 (GLOVE) ×1
GLOVE SURG SS PI 6.5 STRL IVOR (GLOVE) IMPLANT
GOWN STRL REUS W/TWL LRG LVL3 (GOWN DISPOSABLE) ×2 IMPLANT
GOWN STRL REUS W/TWL XL LVL3 (GOWN DISPOSABLE) IMPLANT
HANDPIECE INTERPULSE COAX TIP (DISPOSABLE) ×2
IMMOBILIZER KNEE 20 (SOFTGOODS) ×2
IMMOBILIZER KNEE 20 THIGH 36 (SOFTGOODS) ×1 IMPLANT
INSERT KNEE ATTUNE SZ4 14MM (Insert) ×1 IMPLANT
MANIFOLD NEPTUNE II (INSTRUMENTS) ×2 IMPLANT
NS IRRIG 1000ML POUR BTL (IV SOLUTION) ×2 IMPLANT
PACK TOTAL KNEE CUSTOM (KITS) ×2 IMPLANT
PADDING CAST COTTON 6X4 STRL (CAST SUPPLIES) ×4 IMPLANT
POSITIONER SURGICAL ARM (MISCELLANEOUS) ×2 IMPLANT
SET HNDPC FAN SPRY TIP SCT (DISPOSABLE) ×1 IMPLANT
SUT MNCRL AB 4-0 PS2 18 (SUTURE) ×1 IMPLANT
SUT VIC AB 2-0 CT1 27 (SUTURE) ×6
SUT VIC AB 2-0 CT1 TAPERPNT 27 (SUTURE) ×3 IMPLANT
SUT VLOC 180 0 24IN GS25 (SUTURE) ×2 IMPLANT
SWAB COLLECTION DEVICE MRSA (MISCELLANEOUS) IMPLANT
SWAB CULTURE ESWAB REG 1ML (MISCELLANEOUS) IMPLANT
SYR 50ML LL SCALE MARK (SYRINGE) ×4 IMPLANT
TOWER CARTRIDGE SMART MIX (DISPOSABLE) ×1 IMPLANT
TRAY FOLEY W/METER SILVER 16FR (SET/KITS/TRAYS/PACK) ×2 IMPLANT
TUBE KAMVAC SUCTION (TUBING) IMPLANT
WATER STERILE IRR 1000ML POUR (IV SOLUTION) ×1 IMPLANT
WATER STERILE IRR 1500ML POUR (IV SOLUTION) ×2 IMPLANT
WRAP KNEE MAXI GEL POST OP (GAUZE/BANDAGES/DRESSINGS) ×1 IMPLANT

## 2016-05-28 NOTE — Transfer of Care (Signed)
Immediate Anesthesia Transfer of Care Note  Patient: Angie Olson  Procedure(s) Performed: Procedure(s) with comments: RIGHT TIBIAL POLYETHYLENE KNEE REVISION (Right) - requests 67mns  Patient Location: PACU  Anesthesia Type:Spinal and MAC combined with regional for post-op pain  Level of Consciousness:  sedated, patient cooperative and responds to stimulation  Airway & Oxygen Therapy:Patient Spontanous Breathing and Patient connected to face mask oxgen  Post-op Assessment:  Report given to PACU RN and Post -op Vital signs reviewed and stable  Post vital signs:  Reviewed and stable  Last Vitals:  Vitals:   05/28/16 1524 05/28/16 1525  BP:    Pulse: 97 (!) 101  Resp: 15 20  Temp:      Complications: No apparent anesthesia complications

## 2016-05-28 NOTE — Interval H&P Note (Signed)
History and Physical Interval Note:  05/28/2016 3:24 PM  Angie Olson  has presented today for surgery, with the diagnosis of unstable Right Total Knee arthroplasty   The various methods of treatment have been discussed with the patient and family. After consideration of risks, benefits and other options for treatment, the patient has consented to  Procedure(s) with comments: Right Total Knee arthroplasty polyethylene revision (Right) - requests 31mins as a surgical intervention .  The patient's history has been reviewed, patient examined, no change in status, stable for surgery.  I have reviewed the patient's chart and labs.  Questions were answered to the patient's satisfaction.     Gearlean Alf

## 2016-05-28 NOTE — Anesthesia Preprocedure Evaluation (Signed)
Anesthesia Evaluation  Patient identified by MRN, date of birth, ID band Patient awake    Reviewed: Allergy & Precautions, H&P , NPO status , Patient's Chart, lab work & pertinent test results  History of Anesthesia Complications (+) PONV and history of anesthetic complications  Airway Mallampati: II  TM Distance: >3 FB Neck ROM: Full    Dental no notable dental hx.    Pulmonary shortness of breath, pneumonia, resolved,    Pulmonary exam normal breath sounds clear to auscultation       Cardiovascular Exercise Tolerance: Good hypertension, Pt. on medications + DOE  negative cardio ROS Normal cardiovascular exam Rhythm:Regular Rate:Normal     Neuro/Psych Anxiety CVA, No Residual Symptoms    GI/Hepatic Neg liver ROS, hiatal hernia, GERD  Medicated,  Endo/Other  negative endocrine ROS  Renal/GU Renal disease     Musculoskeletal negative musculoskeletal ROS (+) Arthritis ,   Abdominal (+) + obese,   Peds  Hematology negative hematology ROS (+)   Anesthesia Other Findings   Reproductive/Obstetrics negative OB ROS                             Anesthesia Physical Anesthesia Plan  ASA: III  Anesthesia Plan: Spinal and Regional   Post-op Pain Management:  Regional for Post-op pain   Induction:   Airway Management Planned:   Additional Equipment:   Intra-op Plan:   Post-operative Plan:   Informed Consent: I have reviewed the patients History and Physical, chart, labs and discussed the procedure including the risks, benefits and alternatives for the proposed anesthesia with the patient or authorized representative who has indicated his/her understanding and acceptance.   Dental advisory given  Plan Discussed with: CRNA  Anesthesia Plan Comments:        Anesthesia Quick Evaluation

## 2016-05-28 NOTE — Anesthesia Procedure Notes (Signed)
Anesthesia Regional Block: Adductor canal block   Pre-Anesthetic Checklist: ,, timeout performed, Correct Patient, Correct Site, Correct Laterality, Correct Procedure, Correct Position, site marked, Risks and benefits discussed,  Surgical consent,  Pre-op evaluation,  At surgeon's request and post-op pain management  Laterality: Right  Prep: chloraprep       Needles:  Injection technique: Single-shot  Needle Type: Stimiplex     Needle Length: 9cm  Needle Gauge: 21     Additional Needles:   Procedures: ultrasound guided,,,,,,,,  Narrative:  Start time: 05/28/2016 2:58 PM End time: 05/28/2016 3:01 PM Injection made incrementally with aspirations every 5 mL.  Performed by: Personally  Anesthesiologist: Nolon Nations  Additional Notes: BP cuff, EKG monitors applied. Sedation begun. Artery and nerve location verified with U/S and anesthetic injected incrementally, slowly, and after negative aspirations under direct u/s guidance. Good fascial /perineural spread. Tolerated well.

## 2016-05-28 NOTE — H&P (View-Only) (Signed)
Angie Olson DOB: 04-13-1950 Married / Language: Cleophus Molt / Race: White Female Date of Admission:  05/28/2016 CC:  Right Knee Instability History of Present Illness The patient is a 66 year old female who comes in for a preoperative History and Physical. The patient is scheduled for a right right knee polyethylene revision to be performed by Dr. Dione Plover. Aluisio, MD at [facility] on 05/28/2016. The patient is a 66 year old female who presented for follow up of their knee. Symptoms reported include: pain, aching and difficulty ambulating. The patient feels that they are doing poorly and report their pain level to be moderate to severe. The following medication has been used for pain control: Morphine. The patient has not gotten any relief of their symptoms with Cortisone injections. She states that the knee felt worse for over a week after the injection, and still bothers her more each day. She states that her right hip is bothering her more as well. In regards to her right knee, she is wanting to get scheduled for a poly exchange on the right knee. Her right knee is what has given her the most trouble. The right hip hurts, but not as bad as the knee. She has some instability in that total knee. We did a pes bursa injection because she had some symptoms consistent with bursitis, but it did help. Pain is worse getting up from a sitting position and doing stairs. We previously had discussed a possible polyethylene revision. She tried bracing, it did not help. I do feel we can get her more stable for sure doing a poly revision and with stability her swelling and pain should also improve. The pain part would be less predictable, but the stability part would be very predictable. We will go ahead and set her up for polyethylene versus total knee revision. They have been treated conservatively in the past for the above stated problem and despite conservative measures, they continue to have progressive  pain and severe functional limitations and dysfunction. They have failed non-operative management including home exercise, medications, bracing and injections. It is felt that they would benefit from undergoing revision of the total joint replacement. Risks and benefits of the procedure have been discussed with the patient and they elect to proceed with surgery. There are no active contraindications to surgery such as ongoing infection or rapidly progressive neurological disease.   Problem List/Past Medical Shoulder impingement, left (M75.42)  Status post left hip replacement (G38.756)  Shoulder pain (M25.519)  S/P Knee Arthroscopy (Z47.89)  Primary osteoarthritis of right hip (M16.11)  Status post total right knee replacement (E33.295)  Hypercholesterolemia  Irritable bowel syndrome  Rheumatoid Arthritis  Cerebrovascular Accident  Mini Stroke High blood pressure  Skin Cancer  Melanoma Carotid Arterial Disease  Shingles  Hiatal Hernia  Gastroesophageal Reflux Disease  Urinary Tract Infection  Measles  Mumps  Menopause   Allergies  Dust  Mold Spores  No Known Drug Allergies   Family History Hypertension  mother, father, sister and brother mother and father Congestive Heart Failure  First Degree Relatives. father Osteoarthritis  mother Chronic Obstructive Lung Disease  mother Cancer  brother and grandfather mothers side Diabetes Mellitus  father grandmother fathers side Depression  sister Heart Disease  First Degree Relatives. mother, father, sister and brother  Social History Current work status  retired Tobacco use  Never smoker. never smoker Previously in rehab  no Alcohol use  social only occasionally per week Drug/Alcohol Rehab (Currently)  no Exercise  Exercises daily; does running / walking Exercises daily; does running / walking and gym / weights Drug/Alcohol Rehab (Previously)  no Children  2 Illicit drug use  no Living  situation  live with spouse Marital status  married Number of flights of stairs before winded  2-3 Pain Contract  no Tobacco / smoke exposure  no Post-Surgical Plans   Medication History Flonase Active. ZyrTEC Allergy (10MG  Capsule, Oral) Active. NexIUM (40MG  Capsule DR, Oral) Active. Oxycodone Active. Cyclobenzaprine Active. ALPRAZolam (1MG  Tablet, Oral) Active. (1/2 qhs) Leflunomide (20MG  Tablet, Oral) Active. Atorvastatin Calcium (20MG  Tablet, Oral) Active. AmLODIPine Besylate (5MG  Tablet, Oral) Active. Sucralfate Active. Tramadol Active. Vitamin D Active. PredniSONE (5MG  Tablet, Oral) Active. Nortriptyline HCl (25MG  Capsule, Oral) Active. Lisinopril (40MG  Tablet, Oral) Active.   Past Surgical History  Spinal Fusion  lower back five times Total Hip Replacement - Left [04/25/2015]: Tubal Ligation  Other Orthopaedic Surgery  Gallbladder Surgery  laporoscopic Spinal Surgery  Ankle Surgery  left Other Surgery  Right hand trigger finger twice and left hand thumb fused Colon Polyp Removal - Colonoscopy  Arthroscopy of Shoulder  left Total Knee Replacement - Right  Date: 07/2013.   Review of Systems General Present- Fatigue and Night Sweats. Not Present- Chills, Fever, Memory Loss, Weight Gain and Weight Loss. Skin Not Present- Eczema, Hives, Itching, Lesions and Rash. HEENT Not Present- Dentures, Double Vision, Headache, Hearing Loss, Tinnitus and Visual Loss. Respiratory Not Present- Allergies, Chronic Cough, Coughing up blood, Shortness of breath at rest and Shortness of breath with exertion. Cardiovascular Not Present- Chest Pain, Difficulty Breathing Lying Down, Murmur, Palpitations, Racing/skipping heartbeats and Swelling. Gastrointestinal Present- Diarrhea, Heartburn and Nausea. Not Present- Abdominal Pain, Bloody Stool, Constipation, Difficulty Swallowing, Jaundice, Loss of appetitie and Vomiting. Female Genitourinary Not Present-  Blood in Urine, Discharge, Flank Pain, Incontinence, Painful Urination, Urgency, Urinary frequency, Urinary Retention, Urinating at Night and Weak urinary stream. Musculoskeletal Present- Back Pain and Joint Pain. Not Present- Joint Swelling, Morning Stiffness, Muscle Pain, Muscle Weakness and Spasms. Neurological Not Present- Blackout spells, Difficulty with balance, Dizziness, Paralysis, Tremor and Weakness. Psychiatric Not Present- Insomnia.  Vitals Weight: 190 lb Height: 65in Weight was reported by patient. Height was reported by patient. Body Surface Area: 1.94 m Body Mass Index: 31.62 kg/m  Pulse: 96 (Regular)  BP: 138/68 (Sitting, Left Arm, Standard)  Physical Exam General Mental Status -Alert, cooperative and good historian. General Appearance-pleasant, Not in acute distress. Orientation-Oriented X3. Build & Nutrition-Well nourished and Well developed.  Head and Neck Head-normocephalic, atraumatic . Neck Global Assessment - supple, no bruit auscultated on the right, no bruit auscultated on the left.  Eye Vision-Wears corrective lenses(readers only). Pupil - Bilateral-Regular and Round. Motion - Bilateral-EOMI.  Chest and Lung Exam Auscultation Breath sounds - clear at anterior chest wall and clear at posterior chest wall. Adventitious sounds - No Adventitious sounds.  Cardiovascular Auscultation Rhythm - Regular rate and rhythm. Heart Sounds - S1 WNL and S2 WNL. Murmurs & Other Heart Sounds - Auscultation of the heart reveals - No Murmurs.  Abdomen Palpation/Percussion Tenderness - Abdomen is non-tender to palpation. Rigidity (guarding) - Abdomen is soft. Auscultation Auscultation of the abdomen reveals - Bowel sounds normal.  Female Genitourinary Note: Not done, not pertinent to present illness   Musculoskeletal Note: She is in no distress. Her right knee shows no effusion. She hyperextends a few degrees and flexes to about 115.  She does have some varus, valgus and AP laxity.  Assessment & Plan Status post total right  knee replacement (C16.384) Unstable Right Total Knee Arthroplasty  Note:Surgical Plans: Right Total Knee Poly Revision  Disposition: Home, Wants to start with HHPT (out of [place]).  PCP: Dr. Velna Hatchet  Topical TXA - History of Stroke  Anesthesia Issues: Nausea in the past  Patient was instructed on what medications to stop prior to surgery.  Signed electronically by Ok Edwards, III PA-C

## 2016-05-28 NOTE — Anesthesia Procedure Notes (Signed)
Spinal  Patient location during procedure: OR Staffing Anesthesiologist: Nolon Nations Performed: anesthesiologist  Preanesthetic Checklist Completed: patient identified, site marked, surgical consent, pre-op evaluation, timeout performed, IV checked, risks and benefits discussed and monitors and equipment checked Spinal Block Patient position: sitting Prep: site prepped and draped and DuraPrep Patient monitoring: heart rate, continuous pulse ox, blood pressure and cardiac monitor Approach: left paramedian Location: L2-3 Injection technique: single-shot Needle Needle type: Sprotte  Needle gauge: 24 G Needle length: 9 cm Additional Notes Expiration date of kit checked and confirmed. Patient tolerated procedure well, without complications.

## 2016-05-28 NOTE — Progress Notes (Signed)
Assisted Dr. Germeroth with right, ultrasound guided, adductor canal block. Side rails up, monitors on throughout procedure. See vital signs in flow sheet. Tolerated Procedure well. 

## 2016-05-28 NOTE — Anesthesia Postprocedure Evaluation (Signed)
Anesthesia Post Note  Patient: Angie Olson  Procedure(s) Performed: Procedure(s) (LRB): RIGHT TIBIAL POLYETHYLENE KNEE REVISION (Right)  Patient location during evaluation: PACU Anesthesia Type: Regional and Spinal Level of consciousness: awake and alert Pain management: pain level controlled Vital Signs Assessment: post-procedure vital signs reviewed and stable Respiratory status: spontaneous breathing and respiratory function stable Cardiovascular status: blood pressure returned to baseline and stable Postop Assessment: spinal receding Anesthetic complications: no       Last Vitals:  Vitals:   05/28/16 1845 05/28/16 1940  BP: (!) 141/79 129/83  Pulse: 83 86  Resp: 15 16  Temp: 36.6 C 36.7 C    Last Pain:  Vitals:   05/28/16 1950  TempSrc:   PainSc: [place]

## 2016-05-28 NOTE — Brief Op Note (Signed)
05/28/2016  4:54 PM  PATIENT:  Angie Olson  66 y.o. female  PRE-OPERATIVE DIAGNOSIS:  unstable Right Total Knee arthroplasty   POST-OPERATIVE DIAGNOSIS:  unstable Right Total Knee arthroplasty   PROCEDURE:  Procedure(s) with comments: RIGHT TIBIAL POLYETHYLENE KNEE REVISION (Right) - requests 105mins  SURGEON:  Surgeon(s) and Role:    * Gaynelle Arabian, MD - Primary  PHYSICIAN ASSISTANT:   ASSISTANTS: [name], PA-C   ANESTHESIA:   Adductor canal block and spinal  EBL:  Total I/O In: 1000 [I.V.:1000] Out: -   BLOOD ADMINISTERED:none  DRAINS: (Medium) Hemovact drain(s) in the right knee with  Suction Open   LOCAL MEDICATIONS USED:  OTHER Expare  COUNTS:  YES  TOURNIQUET:   Total Tourniquet Time Documented: Thigh (Right) - 20 minutes Total: Thigh (Right) - 20 minutes   DICTATION: .Other Dictation: Dictation Number [id number]  PLAN OF CARE: Admit to inpatient   PATIENT DISPOSITION:  PACU - hemodynamically stable.

## 2016-05-29 ENCOUNTER — Encounter (HOSPITAL_COMMUNITY): Payer: Self-pay | Admitting: Orthopedic Surgery

## 2016-05-29 LAB — BASIC METABOLIC PANEL
Anion gap: 7 (ref 5–15)
BUN: 16 mg/dL (ref 6–20)
CALCIUM: 9.3 mg/dL (ref 8.9–10.3)
CO2: 28 mmol/L (ref 22–32)
CREATININE: 0.84 mg/dL (ref 0.44–1.00)
Chloride: 105 mmol/L (ref 101–111)
GFR calc Af Amer: >60 mL/min (ref >60)
GLUCOSE: 236 mg/dL — AB (ref 65–99)
POTASSIUM: 4.7 mmol/L (ref 3.5–5.1)
Sodium: 140 mmol/L (ref 135–145)

## 2016-05-29 LAB — CBC
HCT: 35.2 % — ABNORMAL LOW (ref 36.0–46.0)
Hemoglobin: 11.2 g/dL — ABNORMAL LOW (ref 12.0–15.0)
MCH: 25.5 pg — AB (ref 26.0–34.0)
MCHC: 31.8 g/dL (ref 30.0–36.0)
MCV: 80 fL (ref 78.0–100.0)
PLATELETS: 335 10*3/uL (ref 150–400)
RBC: 4.4 MIL/uL (ref 3.87–5.11)
RDW: 15.1 % (ref 11.5–15.5)
WBC: 8.7 10*3/uL (ref 4.0–10.5)

## 2016-05-29 MED ORDER — OXYCODONE HCL 5 MG PO TABS: 5.0000 mg | ORAL_TABLET | ORAL | 0 refills | Status: DC | PRN

## 2016-05-29 MED ORDER — ESOMEPRAZOLE MAGNESIUM 20 MG PO CPDR
20.0000 mg | DELAYED_RELEASE_CAPSULE | Freq: Every day | ORAL | Status: DC
  Administered 2016-05-30: 10:00:00 20 mg via ORAL
  Filled 2016-05-29 (×2): qty 1

## 2016-05-29 MED ORDER — NON FORMULARY: 20.0000 mg | Freq: Every day | Status: DC

## 2016-05-29 MED ORDER — ASPIRIN 325 MG PO TBEC: 325.0000 mg | DELAYED_RELEASE_TABLET | Freq: Two times a day (BID) | ORAL | 0 refills | Status: DC

## 2016-05-29 MED ORDER — TRAMADOL HCL 50 MG PO TABS: 50.0000 mg | ORAL_TABLET | Freq: Four times a day (QID) | ORAL | 1 refills | Status: DC | PRN

## 2016-05-29 NOTE — Care Management Note (Addendum)
Case Management Note  Patient Details  Name: ANDREINA OUTTEN MRN: [id number] Date of Birth: 28-Apr-1950  Subjective/Objective:                  RIGHT TIBIAL POLYETHYLENE KNEE REVISION (Right) Action/Plan: Discharge planning Expected Discharge Date:                  Expected Discharge Plan:     In-House Referral:     Discharge planning Services  CM Consult  Post Acute Care Choice:    Choice offered to:     DME Arranged:    DME Agency:     HH Arranged:    [place] Agency:     Status of Service:  In process, will continue to follow  If discussed at Long Length of Stay Meetings, dates discussed:    Additional Comments: 15:37 [facility] now accepts pt for HHPT. CM notified Charge Rns and pt and has reqeusted face to face and HHPT order.  NO other CM needs were communicated.   CM met with pt to offer choice and pt chooses [facility]. Unfortunately, [facility] is not accepting referral as insurance is not accepted.  CM has contacted [facility], [facility], [facility], [facility] and no agency accepts referral at this time.  CM has made aware and Cm has revisited with [facility] rep, Tim in case staffing opens up with [facility]. CM has reported off to [place] and [facility] and has left a sticky note for MD. CM will follow. Pt has no DME needs. Dellie Catholic, RN 05/29/2016, 3:35 PM

## 2016-05-29 NOTE — Discharge Instructions (Signed)
° °Dr. Frank Aluisio °Total Joint Specialist °[facility] °[address] °[place], [place] [address] °[phone] ° °TOTAL KNEE REPLACEMENT POSTOPERATIVE DIRECTIONS ° °Knee Rehabilitation, Guidelines Following Surgery  °Results after knee surgery are often greatly improved when you follow the exercise, range of motion and muscle strengthening exercises prescribed by your doctor. Safety measures are also important to protect the knee from further injury. Any time any of these exercises cause you to have increased pain or swelling in your knee joint, decrease the amount until you are comfortable again and slowly increase them. If you have problems or questions, call your caregiver or physical therapist for advice.  ° °HOME CARE INSTRUCTIONS  °Remove items at home which could result in a fall. This includes throw rugs or furniture in walking pathways.  °· ICE to the affected knee every three hours for 30 minutes at a time and then as needed for pain and swelling.  Continue to use ice on the knee for pain and swelling from surgery. You may notice swelling that will progress down to the foot and ankle.  This is normal after surgery.  Elevate the leg when you are not up walking on it.   °· Continue to use the breathing machine which will help keep your temperature down.  It is common for your temperature to cycle up and down following surgery, especially at night when you are not up moving around and exerting yourself.  The breathing machine keeps your lungs expanded and your temperature down. °· Do not place pillow under knee, focus on keeping the knee straight while resting ° °DIET °You may resume your previous home diet once your are discharged from the hospital. ° °DRESSING / WOUND CARE / SHOWERING °You may shower 3 days after surgery, but keep the wounds dry during showering.  You may use an occlusive plastic wrap (Press'n Seal for example), NO SOAKING/SUBMERGING IN THE BATHTUB.  If the  bandage gets wet, change with a clean dry gauze.  If the incision gets wet, pat the wound dry with a clean towel. °You may start showering once you are discharged home but do not submerge the incision under water. Just pat the incision dry and apply a dry gauze dressing on daily. °Change the surgical dressing daily and reapply a dry dressing each time. ° °ACTIVITY °Walk with your walker as instructed. °Use walker as long as suggested by your caregivers. °Avoid periods of inactivity such as sitting longer than an hour when not asleep. This helps prevent blood clots.  °You may resume a sexual relationship in one month or when given the OK by your doctor.  °You may return to work once you are cleared by your doctor.  °Do not drive a car for 6 weeks or until released by you surgeon.  °Do not drive while taking narcotics. ° °WEIGHT BEARING °Weight bearing as tolerated with assist device (walker, cane, etc) as directed, use it as long as suggested by your surgeon or therapist, typically at least 4-6 weeks. ° °POSTOPERATIVE CONSTIPATION PROTOCOL °Constipation - defined medically as fewer than three stools per week and severe constipation as less than one stool per week. ° °One of the most common issues patients have following surgery is constipation.  Even if you have a regular bowel pattern at home, your normal regimen is likely to be disrupted due to multiple reasons following surgery.  Combination of anesthesia, postoperative narcotics, change in appetite and fluid intake all can affect your bowels.    In order to avoid complications following surgery, here are some recommendations in order to help you during your recovery period. ° °Colace (docusate) - Pick up an over-the-counter form of Colace or another stool softener and take twice a day as long as you are requiring postoperative pain medications.  Take with a full glass of water daily.  If you experience loose stools or diarrhea, hold the colace until you stool forms  back up.  If your symptoms do not get better within 1 week or if they get worse, check with your doctor. ° °Dulcolax (bisacodyl) - Pick up over-the-counter and take as directed by the product packaging as needed to assist with the movement of your bowels.  Take with a full glass of water.  Use this product as needed if not relieved by Colace only.  ° °MiraLax (polyethylene glycol) - Pick up over-the-counter to have on hand.  MiraLax is a solution that will increase the amount of water in your bowels to assist with bowel movements.  Take as directed and can mix with a glass of water, juice, soda, coffee, or tea.  Take if you go more than two days without a movement. °Do not use MiraLax more than once per day. Call your doctor if you are still constipated or irregular after using this medication for 7 days in a row. ° °If you continue to have problems with postoperative constipation, please contact the office for further assistance and recommendations.  If you experience "the worst abdominal pain ever" or develop nausea or vomiting, please contact the office immediatly for further recommendations for treatment. ° °ITCHING ° If you experience itching with your medications, try taking only a single pain pill, or even half a pain pill at a time.  You can also use Benadryl over the counter for itching or also to help with sleep.  ° °TED HOSE STOCKINGS °Wear the elastic stockings on both legs for three weeks following surgery during the day but you may remove then at night for sleeping. ° °MEDICATIONS °See your medication summary on the “After Visit Summary” that the nursing staff will review with you prior to discharge.  You may have some home medications which will be placed on hold until you complete the course of blood thinner medication.  It is important for you to complete the blood thinner medication as prescribed by your surgeon.  Continue your approved medications as instructed at time of  discharge. ° °PRECAUTIONS °If you experience chest pain or shortness of breath - call 911 immediately for transfer to the hospital emergency department.  °If you develop a fever greater that 101 F, purulent drainage from wound, increased redness or drainage from wound, foul odor from the wound/dressing, or calf pain - CONTACT YOUR SURGEON.   °                                                °FOLLOW-UP APPOINTMENTS °Make sure you keep all of your appointments after your operation with your surgeon and caregivers. You should call the office at the above phone number and make an appointment for approximately two weeks after the date of your surgery or on the date instructed by your surgeon outlined in the "After Visit Summary". ° ° °RANGE OF MOTION AND STRENGTHENING EXERCISES  °Rehabilitation of the knee is important following a knee injury or   an operation. After just a few days of immobilization, the muscles of the thigh which control the knee become weakened and shrink (atrophy). Knee exercises are designed to build up the tone and strength of the thigh muscles and to improve knee motion. Often times heat used for twenty to thirty minutes before working out will loosen up your tissues and help with improving the range of motion but do not use heat for the first two weeks following surgery. These exercises can be done on a training (exercise) mat, on the floor, on a table or on a bed. Use what ever works the best and is most comfortable for you Knee exercises include:  Leg Lifts - While your knee is still immobilized in a splint or cast, you can do straight leg raises. Lift the leg to 60 degrees, hold for 3 sec, and slowly lower the leg. Repeat 10-20 times 2-3 times daily. Perform this exercise against resistance later as your knee gets better.  Quad and Hamstring Sets - Tighten up the muscle on the front of the thigh (Quad) and hold for 5-10 sec. Repeat this 10-20 times hourly. Hamstring sets are done by pushing the  foot backward against an object and holding for 5-10 sec. Repeat as with quad sets.   Leg Slides: Lying on your back, slowly slide your foot toward your buttocks, bending your knee up off the floor (only go as far as is comfortable). Then slowly slide your foot back down until your leg is flat on the floor again.  Angel Wings: Lying on your back spread your legs to the side as far apart as you can without causing discomfort.  A rehabilitation program following serious knee injuries can speed recovery and prevent re-injury in the future due to weakened muscles. Contact your doctor or a physical therapist for more information on knee rehabilitation.   IF YOU ARE TRANSFERRED TO A SKILLED REHAB FACILITY If the patient is transferred to a skilled rehab facility following release from the hospital, a list of the current medications will be sent to the facility for the patient to continue.  When discharged from the skilled rehab facility, please have the facility set up the patient's [place] prior to being released. Also, the skilled facility will be responsible for providing the patient with their medications at time of release from the facility to include their pain medication, the muscle relaxants, and their blood thinner medication. If the patient is still at the rehab facility at time of the two week follow up appointment, the skilled rehab facility will also need to assist the patient in arranging follow up appointment in our office and any transportation needs.  MAKE SURE YOU:  Understand these instructions.  Get help right away if you are not doing well or get worse.    Pick up stool softner and laxative for home use following surgery while on pain medications. Do not submerge incision under water. Please use good hand washing techniques while changing dressing each day. May shower starting three days after surgery. Please use a clean towel to pat the incision dry following  showers. Continue to use ice for pain and swelling after surgery. Do not use any lotions or creams on the incision until instructed by your surgeon.  Take a full dose 325 mg Aspirin twice a day for four weeks.

## 2016-05-29 NOTE — Progress Notes (Signed)
  Physical Therapy Treatment Patient Details Name: Angie Olson MRN: [id number] DOB: [birth year] Today's Date: 05/29/2016    History of Present Illness Pt is a 66 y/o female s/p R TKA. 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    PT Comments    The patient is progressing well. Plans DC tomorrow.   Follow Up Recommendations  Home health PT     Equipment Recommendations  None recommended by PT    Recommendations for Other Services       Precautions / Restrictions Precautions Precautions: Fall;Knee;Back Precaution Comments: HAS A BACK BRace but does not wear Required Braces or Orthoses: Knee Immobilizer - Right Knee Immobilizer - Right: Discontinue once straight leg raise with < 10 degree lag    Mobility  Bed Mobility   Bed Mobility: Supine to Sit;Sit to Supine     Supine to sit: Supervision Sit to supine: Min assist   General bed mobility comments: assist the right leg onto bed  Transfers Overall transfer level: Needs assistance Equipment used: Rolling walker (2 wheeled) Transfers: Sit to/from Stand Sit to Stand: Min guard         General transfer comment: cues for technique during functional mobility and transfers w/ RW.   Ambulation/Gait Ambulation/Gait assistance: Min guard Ambulation Distance (Feet): 150 Feet Assistive device: Rolling walker (2 wheeled) Gait Pattern/deviations: Step-to pattern;Step-through pattern     General Gait Details: cues for sequence   Stairs            Wheelchair Mobility    Modified Rankin (Stroke Patients Only)       Balance                                    Cognition Arousal/Alertness: Awake/alert                          Exercises      General Comments        Pertinent Vitals/Pain Pain Score: 8  Pain Location: R knee Pain Descriptors /  Indicators: Aching Pain Intervention(s): Patient requesting pain meds-RN notified;Monitored during session    Home Living                      Prior Function            PT Goals (current goals can now be found in the care plan section) Progress towards PT goals: Progressing toward goals    Frequency    7X/week      PT Plan Current plan remains appropriate    Co-evaluation             End of Session   Activity Tolerance: Patient tolerated treatment well Patient left: in bed;with call bell/phone within reach Nurse Communication: Mobility status PT Visit Diagnosis: Muscle weakness (generalized) (M62.81);Pain Pain - Right/Left: Right Pain - part of body: Knee     Time: 1962-2297 PT Time Calculation (min) (ACUTE ONLY): 21 min  Charges:  $Gait Training: 8-22 mins                    G Codes:       Claretha Cooper 05/29/2016, 3:06 PM Tresa Endo PT [phone]

## 2016-05-29 NOTE — Progress Notes (Signed)
Subjective: 1 Day Post-Op Procedure(s) (LRB): RIGHT TIBIAL POLYETHYLENE KNEE REVISION (Right) Patient reports pain as mild and moderate.   Patient seen in rounds with Dr. Wynelle Link.  Not much sleep last night Patient is well, but has had some minor complaints of pain in the knee, requiring pain medications We will start therapy today.  Plan is to go Home after hospital stay.  She wants to use HHPT (an agency out of [place]).  Objective: Vital signs in last 24 hours: Temp:  [97.5 F (36.4 C)-98.1 F (36.7 C)] 97.5 F (36.4 C) (03/15 0543) Pulse Rate:  [76-102] 84 (03/15 0543) Resp:  [11-23] 16 (03/15 0543) BP: (116-161)/(61-96) 144/79 (03/15 0543) SpO2:  [95 %-100 %] 99 % (03/15 0543) Weight:  [88.9 kg (196 lb)] 88.9 kg (196 lb) (03/14 1328)  Intake/Output from previous day:  Intake/Output Summary (Last 24 hours) at 05/29/16 0918 Last data filed at 05/29/16 0600  Gross per 24 hour  Intake           2717.5 ml  Output             1170 ml  Net           1547.5 ml    Intake/Output this shift: No intake/output data recorded.  Labs:  Recent Labs  05/29/16 0423  HGB 11.2*    Recent Labs  05/29/16 0423  WBC 8.7  RBC 4.40  HCT 35.2*  PLT 335    Recent Labs  05/29/16 0423  NA 140  K 4.7  CL 105  CO2 28  BUN 16  CREATININE 0.84  GLUCOSE 236*  CALCIUM 9.3   No results for input(s): LABPT, INR in the last 72 hours.  EXAM General - Patient is Alert, Appropriate and Oriented Extremity - Neurovascular intact Sensation intact distally Intact pulses distally Dorsiflexion/Plantar flexion intact Dressing - dressing C/D/I Motor Function - intact, moving foot and toes well on exam.  Hemovac pulled without difficulty.  Past Medical History:  Diagnosis Date  . Acid reflux   . Allergic rhinitis   . Anxiety    PT'S SON DIED 10-Jul-2010  . Arthritis    RA AND OA  . Borderline hypercholesterolemia    on medication for TIA  . Bronchitis    CHRONIC   . Colon polyp     . Environmental allergies   . GERD (gastroesophageal reflux disease)    on PPI and carafte prn  . H/O hiatal hernia   . Hypertension   . IBS (irritable bowel syndrome)   . LBP (low back pain)   . OA (osteoarthritis)    Left Shoulder  . Pain    RIGHT KNEE - TORN MENISCUS AND ACL  . Pneumonia    X 3  - LAST TIME WAS 07/10/11  . PONV (postoperative nausea and vomiting) 09-2012   sore throat after ankle surgery 6 yrs ago and severe ponv 09-2012  . Pulmonary nodule seen on imaging study   . Shortness of breath    with activity,bronchitis  . Stroke ([place]) 03/2011   TIA--PT EXPERIENCED NUMBNESS RT HAND AND ARM , HEADACHE AND NAUSEA. NO RESIDUAL PROBLEMS    Assessment/Plan: 1 Day Post-Op Procedure(s) (LRB): RIGHT TIBIAL POLYETHYLENE KNEE REVISION (Right) Principal Problem:   Failed total knee arthroplasty, sequela Active Problems:   Failed total knee arthroplasty ([place])  Estimated body mass index is 32.62 kg/m as calculated from the following:   Height as of this encounter: 5\' 5"  (1.651 m).   Weight  as of this encounter: 88.9 kg (196 lb). Advance diet Up with therapy Plan for discharge tomorrow Discharge home with home health  DVT Prophylaxis - Aspirin 325 mg BID for four weeks. Weight-Bearing as tolerated to right leg D/C O2 and Pulse OX and try on Room Air  [name], PA-C Orthopaedic Surgery 05/29/2016, 9:18 AM

## 2016-05-29 NOTE — Op Note (Signed)
Angie Olson, Angie Olson NO.:  [id number]  MEDICAL RECORD NO.:  [id number]  LOCATION:                                 FACILITY:  PHYSICIAN:  Gaynelle Arabian, M.D.         DATE OF BIRTH:  DATE OF PROCEDURE:  05/28/2016 DATE OF DISCHARGE:                              OPERATIVE REPORT   PREOPERATIVE DIAGNOSIS:  Unstable right total knee arthroplasty.  POSTOPERATIVE DIAGNOSIS:  Unstable right total knee arthroplasty.  PROCEDURE:  Right knee tibial polyethylene revision.  SURGEON:  Gaynelle Arabian, M.D.  ASSISTANT:  Alexzandrew L. Perkins, PA-C.  ANESTHESIA:  Adductor canal block and spinal.  ESTIMATED BLOOD LOSS:  Minimal.  DRAINS:  Hemovac x1.  TOURNIQUET TIME:  20 minutes at 300 mmHg.  COMPLICATIONS:  None.  CONDITION:  Stable to Recovery.  BRIEF CLINICAL NOTE:  Angie Olson is a 66 year old female, who had a total knee arthroplasty done several years ago.  She did well initially, then she developed progressive pain and instability.  She has had worsening symptoms and presents now for polyethylene versus total knee revision for the instability.  PROCEDURE IN DETAIL:  After successful administration of adductor canal block and spinal, exam under anesthesia was performed.  She has significant varus-valgus laxity in extension, AP laxity, and 90+ degrees of flexion.  We then placed a tourniquet high on her right thigh and her right lower extremity was prepped and draped in the usual sterile fashion.  Extremities wrapped in Esmarch, and tourniquet inflated to 300 mmHg.  A midline incision was made with a 10 blade through subcutaneous tissue to the level of the extensor mechanism.  A fresh blade was used to make a medial parapatellar arthrotomy.  Soft tissue on the proximal medial tibia subperiosteally elevated to the joint line with a knife and into the semimembranosus bursa with a Cobb elevator.  The soft tissues elevated laterally with attention being paid  to avoiding the patellar tendon on tibial tubercle.  Patella was everted, knee flexed 90 degrees. I was not able to dislocate the tibia from the femur but I was able to remove the tibial polyethylene by cutting the cone with an oscillating saw.  The polyethylene was removed.  We inspected the metal components, they were in excellent position, but no signs of any loosening.  The thickness of the polyethylene that we removed was 10-mm thick for a size 4 DePuy attune femur.  We then went up to a 14-mm trial which is 2 sizes bigger.  With the trial in place, we reduced the knee.  Full extension was achieved with excellent varus-valgus and anterior-posterior balance throughout full range of motion.  I was very satisfied with the 14 and its stability.  We removed the trial and then placed a permanent 14-mm rotating platform posterior stabilized insert for the DePuy attune size 4.  The knee was reduced with the same stability parameters.  The wound was then copiously irrigated with saline solution and the arthrotomy closed over Hemovac drain with a running #1 Stratafix suture.  Flexion against gravity to 135 degrees.  Patella tracks normally.  Tourniquets released for a tourniquet time of 20 minutes.  Subcutaneous tissues then closed with interrupted 2-0 Vicryl and subcuticular running 4-0 Monocryl.  The incision was then cleaned and dried and Steri-Strips and a bulky sterile dressing are applied.  She was then placed into a knee immobilizer, awakened, and transported to Recovery in stable condition.     Gaynelle Arabian, M.D.     FA/MEDQ  D:  05/28/2016  T:  05/28/2016  Job:  [id number]

## 2016-05-29 NOTE — Evaluation (Signed)
Occupational Therapy Evaluation Patient Details Name: Angie Olson MRN: [id number] DOB: Oct 08, 1950 Today's Date: 05/29/2016    History of Present Illness Pt is a 66 y/o female s/p R TKA. 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   Clinical Impression   Pt is overall currently min guard A following R TKA. After assessment, she participated in ADL retraining session to consist of grooming/bathing standing at sink and sitting in chair. Toileting, bed mobility and shower transfer simulation. Pt has DME and a/e at home and states that her husband/family can assist PRN at d/c. She denies any other acute OT needs at this time, will sign off.    Follow Up Recommendations  No OT follow up    Equipment Recommendations  None recommended by OT (Pt has DME and A/E)    Recommendations for Other Services       Precautions / Restrictions Precautions Precautions: Fall;Knee Required Braces or Orthoses: Knee Immobilizer - Right Restrictions Weight Bearing Restrictions: Yes RLE Weight Bearing: Weight bearing as tolerated      Mobility Bed Mobility Overal bed mobility: Modified Independent             General bed mobility comments: HOB elevated, no physical assist  Transfers Overall transfer level: Needs assistance Equipment used: Rolling walker (2 wheeled) Transfers: Sit to/from [facility] Sit to Stand: Min guard Stand pivot transfers: Min guard       General transfer comment: Pt using good technique during functional mobility and transfers w/ RW.     Balance Overall balance assessment: No apparent balance deficits (not formally assessed)                                          ADL Overall ADL's : Needs assistance/impaired Eating/Feeding: Independent;Sitting   Grooming: Wash/dry  hands;Wash/dry face;Oral care;Applying deodorant;Brushing hair;Set up;Sitting   Upper Body Bathing: Set up;Sitting   Lower Body Bathing: Set up;Min guard;Sit to/from stand Lower Body Bathing Details (indicate cue type and reason): Has a/e at home also states that her husband can assist PRN Upper Body Dressing : Set up;Sitting   Lower Body Dressing: Min guard;Sit to/from stand Lower Body Dressing Details (indicate cue type and reason): Assist to [facility] socks. Has LH A/e at home states "My husband helped me last time, he can help" Toilet Transfer: Min guard;Ambulation;BSC;RW (3:1 over toilet) Toilet Transfer Details (indicate cue type and reason): Pt used good technique during functional mobility and toilet transfer using RW and 3:1 over toilet Toileting- Clothing Manipulation and Hygiene: Sit to/from stand;Supervision/safety;Min guard   Tub/ Shower Transfer: Walk-in shower;Min guard;Ambulation;Rolling walker;Shower seat (Simulated shower transfer in pt room) Tub/Shower Transfer Details (indicate cue type and reason): Pt has shower seat at home and husband has assisted PRN in the past Functional mobility during ADLs: Supervision/safety;Min guard;Rolling walker General ADL Comments: Pt was educated in role of OT, assessment was performed and she participated in ADL retraining session to consist of grooming standing at sink and sitting in chair. Toileting, bed mobility, bathing and shower transfer simulation. Pt has DME and a/e at home and states that her husband/family can assist PRN at d/c. She denies any other acute OT needs at this time.     Vision  Baseline Vision/History: Wears glasses Wears Glasses: Reading only Patient Visual Report: No change from baseline       Perception     Praxis      Pertinent Vitals/Pain Pain Assessment: 0-10 Pain Score: 3  Pain Location: R knee Pain Descriptors / Indicators: Sore;Aching Pain Intervention(s): Limited activity within patient's  tolerance;Monitored during session;Premedicated before session;Repositioned;Ice applied     Hand Dominance Right   Extremity/Trunk Assessment Upper Extremity Assessment Upper Extremity Assessment: Overall WFL for tasks assessed   Lower Extremity Assessment Lower Extremity Assessment: Defer to PT evaluation       Communication Communication Communication: No difficulties   Cognition Arousal/Alertness: Awake/alert Behavior During Therapy: WFL for tasks assessed/performed Overall Cognitive Status: Within Functional Limits for tasks assessed                     General Comments       Exercises       Shoulder Instructions      Home Living Family/patient expects to be discharged to:: Private residence Living Arrangements: Spouse/significant other Available Help at Discharge: Family;Available 24 hours/day Type of Home: House Home Access: Stairs to enter [employer] of Steps: 3 Entrance Stairs-Rails: Left Home Layout: Multi-level;Able to live on main level with bedroom/bathroom     Bathroom Shower/Tub: Occupational psychologist: Handicapped height Bathroom Accessibility: Yes   Home Equipment: Environmental consultant - 2 wheels;Walker - 4 wheels;Bedside commode;Cane - single point;Shower seat Adaptive Equipment: Reacher;Long-handled sponge        Prior Functioning/Environment Level of Independence: Independent with assistive device(s)        Comments: Bathes and dresses herself. She does her own house keeping bur "leaves what doesn't need to be done" Husband able to assist PRN (He does have dx Parkinsons).        OT Problem List:        OT Treatment/Interventions:      OT Goals(Current goals can be found in the care plan section) Acute Rehab OT Goals Patient Stated Goal: Go home with spouse PRN assist OT Goal Formulation: All assessment and education complete, DC therapy  OT Frequency:     Barriers to D/C:            Co-evaluation               End of Session Equipment Utilized During Treatment: Rolling walker;Gait belt;Right knee immobilizer Nurse Communication: Mobility status;Other (comment) (ADl/grooming completed, pt used rest room)  Activity Tolerance: Patient tolerated treatment well Patient left: in chair;with call bell/phone within reach;with chair alarm set  OT Visit Diagnosis: Pain;Muscle weakness (generalized) (M62.81) (Pain in R LE/Knee) Pain - Right/Left: Right Pain - part of body: Leg (Knee)                ADL either performed or assessed with clinical judgement  Time: 0865-7846 OT Time Calculation (min): 39 min Charges:  OT General Charges $OT Visit: 1 Procedure OT Evaluation $OT Eval Low Complexity: 1 Procedure OT Treatments $Self Care/Home Management : 8-22 mins G-Codes: OT G-codes **NOT FOR INPATIENT CLASS** Functional Assessment Tool Used: Clinical judgement;AM-PAC 6 Clicks Daily Activity Functional Limitation: Self care Self Care Current Status (N6295): At least 20 percent but less than 40 percent impaired, limited or restricted Self Care Goal Status (M8413): At least 20 percent but less than 40 percent impaired, limited or restricted Self Care Discharge Status [phone]): At least 20 percent but less than 40 percent impaired, limited or restricted  Carlynn Herald Sylvie Mifsud Beth Dixon, OTR/L 05/29/2016, 8:55 AM

## 2016-05-29 NOTE — Evaluation (Signed)
Physical Therapy Evaluation Patient Details Name: Angie Olson MRN: [id number] DOB: [birth year] Today's Date: 05/29/2016   History of Present Illness  Pt is a 66 y/o female s/p R TKA. 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  Clinical Impression  The patient is progressing well. Plans DC home. Pt admitted with above diagnosis. Pt currently with functional limitations due to the deficits listed below (see PT Problem List).  Pt will benefit from skilled PT to increase their independence and safety with mobility to allow discharge to the venue listed below.       Follow Up Recommendations Home health PT;Supervision - Intermittent    Equipment Recommendations  None recommended by PT    Recommendations for Other Services       Precautions / Restrictions Precautions Precautions: Fall;Knee;Back Precaution Comments: HAS A BACK BRace but does not wear Required Braces or Orthoses: Knee Immobilizer - Right Knee Immobilizer - Right: Discontinue once straight leg raise with < 10 degree lag Restrictions Weight Bearing Restrictions: Yes RLE Weight Bearing: Weight bearing as tolerated      Mobility  Bed Mobility Overal bed mobility: Needs Assistance Bed Mobility: Sit to Supine       Sit to supine: Min assist   General bed mobility comments: assist the right leg onto bed  Transfers Overall transfer level: Needs assistance Equipment used: Rolling walker (2 wheeled) Transfers: Sit to/from [facility] Sit to Stand: Min guard Stand pivot transfers: Min guard       General transfer comment: cues for technique during functional mobility and transfers w/ RW.   Ambulation/Gait Ambulation/Gait assistance: Min guard Ambulation Distance (Feet): 150 Feet Assistive device: Rolling walker (2 wheeled) Gait Pattern/deviations: Step-to  pattern;Step-through pattern     General Gait Details: cues for sequence  Stairs            Wheelchair Mobility    Modified Rankin (Stroke Patients Only)       Balance Overall balance assessment: No apparent balance deficits (not formally assessed)                                           Pertinent Vitals/Pain Pain Assessment: 0-10 Pain Score: 5  Pain Location: R knee Pain Descriptors / Indicators: Aching Pain Intervention(s): Monitored during session;Patient requesting pain meds-RN notified;Repositioned;Ice applied    Home Living Family/patient expects to be discharged to:: Private residence Living Arrangements: Spouse/significant other Available Help at Discharge: Family;Available 24 hours/day Type of Home: House Home Access: Stairs to enter Entrance Stairs-Rails: Left Entrance Stairs-Number of Steps: 3 Home Layout: Multi-level;Able to live on main level with bedroom/bathroom Home Equipment: Gilford Rile - 2 wheels;Walker - 4 wheels;Bedside commode;Cane - single point;Shower seat      Prior Function Level of Independence: Independent with assistive device(s)         Comments: Bathes and dresses herself. She does her own house keeping bur "leaves what doesn't need to be done" Husband able to assist PRN (He does have dx Parkinsons).     Hand Dominance   Dominant Hand: Right    Extremity/Trunk Assessment   Upper Extremity Assessment Upper Extremity Assessment: Defer to OT evaluation    Lower Extremity Assessment Lower Extremity Assessment: RLE deficits/detail RLE Deficits / Details: 15-60 knee  flexion, SLR withh min assist    Cervical / Trunk Assessment Cervical / Trunk Assessment: Normal  Communication   Communication: No difficulties  Cognition Arousal/Alertness: Awake/alert Behavior During Therapy: WFL for tasks assessed/performed Overall Cognitive Status: Within Functional Limits for tasks assessed                       General Comments      Exercises Total Joint Exercises Ankle Circles/Pumps: AROM;Both;10 reps Quad Sets: AROM;Right;10 reps Heel Slides: AAROM;Right;10 reps Straight Leg Raises: AAROM;Right;10 reps   Assessment/Plan    PT Assessment Patient needs continued PT services  PT Problem List Decreased strength;Decreased range of motion;Decreased activity tolerance;Decreased mobility;Decreased knowledge of precautions;Decreased safety awareness;Decreased knowledge of use of DME;Pain       PT Treatment Interventions DME instruction;Gait training;Stair training;Therapeutic activities;Patient/family education    PT Goals (Current goals can be found in the Care Plan section)  Acute Rehab PT Goals Patient Stated Goal: Go home with spouse PRN assist PT Goal Formulation: With patient Time For Goal Achievement: 06/01/16 Potential to Achieve Goals: Good    Frequency 7X/week   Barriers to discharge        Co-evaluation               End of Session Equipment Utilized During Treatment: Right knee immobilizer Activity Tolerance: Patient tolerated treatment well Patient left: in bed;with call bell/phone within reach Nurse Communication: Mobility status PT Visit Diagnosis: Muscle weakness (generalized) (M62.81);Pain Pain - Right/Left: Right Pain - part of body: Knee         Time: 1308-6578 PT Time Calculation (min) (ACUTE ONLY): 38 min   Charges:   PT Evaluation $PT Eval Low Complexity: 1 Procedure PT Treatments $Gait Training: 8-22 mins $Therapeutic Exercise: 8-22 mins   PT G Codes:         Claretha Cooper 05/29/2016, 9:49 AM Tresa Endo PT [phone]

## 2016-05-29 NOTE — Discharge Summary (Signed)
Physician Discharge Summary   Patient ID: Angie Olson MRN: [id number] DOB/AGE: 1951/02/18 66 y.o.  Admit date: 05/28/2016 Discharge date: 05/30/2016  Primary Diagnosis:  Unstable right total knee arthroplasty. Admission Diagnoses:  Past Medical History:  Diagnosis Date  . Acid reflux   . Allergic rhinitis   . Anxiety    PT'S SON DIED 2010/05/26  . Arthritis    RA AND OA  . Borderline hypercholesterolemia    on medication for TIA  . Bronchitis    CHRONIC   . Colon polyp   . Environmental allergies   . GERD (gastroesophageal reflux disease)    on PPI and carafte prn  . H/O hiatal hernia   . Hypertension   . IBS (irritable bowel syndrome)   . LBP (low back pain)   . OA (osteoarthritis)    Left Shoulder  . Pain    RIGHT KNEE - TORN MENISCUS AND ACL  . Pneumonia    X 3  - LAST TIME WAS 2011/05/26  . PONV (postoperative nausea and vomiting) 09-2012   sore throat after ankle surgery 6 yrs ago and severe ponv 09-2012  . Pulmonary nodule seen on imaging study   . Shortness of breath    with activity,bronchitis  . Stroke ([place]) 03/2011   TIA--PT EXPERIENCED NUMBNESS RT HAND AND ARM , HEADACHE AND NAUSEA. NO RESIDUAL PROBLEMS   Discharge Diagnoses:   Principal Problem:   Failed total knee arthroplasty, sequela Active Problems:   Failed total knee arthroplasty ([place])  Estimated body mass index is 32.62 kg/m as calculated from the following:   Height as of this encounter: '5\' 5"'$  (1.651 m).   Weight as of this encounter: 88.9 kg (196 lb).  Procedure:  Procedure(s) (LRB): RIGHT TIBIAL POLYETHYLENE KNEE REVISION (Right)   Consults: None  HPI:  Laboratory Data: Admission on 05/28/2016  Component Date Value Ref Range Status  . WBC 05/29/2016 8.7  4.0 - 10.5 K/uL Final  . RBC 05/29/2016 4.40  3.87 - 5.11 MIL/uL Final  . Hemoglobin 05/29/2016 11.2* 12.0 - 15.0 g/dL Final  . HCT 05/29/2016 35.2* 36.0 - 46.0 % Final  . MCV 05/29/2016 80.0  78.0 - 100.0 fL Final  . MCH 05/29/2016  25.5* 26.0 - 34.0 pg Final  . MCHC 05/29/2016 31.8  30.0 - 36.0 g/dL Final  . RDW 05/29/2016 15.1  11.5 - 15.5 % Final  . Platelets 05/29/2016 335  150 - 400 K/uL Final  . Sodium 05/29/2016 140  135 - 145 mmol/L Final  . Potassium 05/29/2016 4.7  3.5 - 5.1 mmol/L Final  . Chloride 05/29/2016 105  101 - 111 mmol/L Final  . CO2 05/29/2016 28  22 - 32 mmol/L Final  . Glucose, Bld 05/29/2016 236* 65 - 99 mg/dL Final  . BUN 05/29/2016 16  6 - 20 mg/dL Final  . Creatinine, Ser 05/29/2016 0.84  0.44 - 1.00 mg/dL Final  . Calcium 05/29/2016 9.3  8.9 - 10.3 mg/dL Final  . GFR calc non Af Amer 05/29/2016 >60  >60 mL/min Final  . GFR calc Af Amer 05/29/2016 >60  >60 mL/min Final   Comment: (NOTE) The eGFR has been calculated using the CKD EPI equation. This calculation has not been validated in all clinical situations. eGFR's persistently <60 mL/min signify possible Chronic Kidney Disease.   . Anion gap 05/29/2016 7  5 - 15 Final  . WBC 05/30/2016 15.6* 4.0 - 10.5 K/uL Final  . RBC 05/30/2016 3.96  3.87 - 5.11 MIL/uL  Final  . Hemoglobin 05/30/2016 10.6* 12.0 - 15.0 g/dL Final  . HCT 05/30/2016 31.9* 36.0 - 46.0 % Final  . MCV 05/30/2016 80.6  78.0 - 100.0 fL Final  . MCH 05/30/2016 26.8  26.0 - 34.0 pg Final  . MCHC 05/30/2016 33.2  30.0 - 36.0 g/dL Final  . RDW 05/30/2016 15.5  11.5 - 15.5 % Final  . Platelets 05/30/2016 348  150 - 400 K/uL Final  . Sodium 05/30/2016 138  135 - 145 mmol/L Final  . Potassium 05/30/2016 4.2  3.5 - 5.1 mmol/L Final  . Chloride 05/30/2016 104  101 - 111 mmol/L Final  . CO2 05/30/2016 29  22 - 32 mmol/L Final  . Glucose, Bld 05/30/2016 196* 65 - 99 mg/dL Final  . BUN 05/30/2016 19  6 - 20 mg/dL Final  . Creatinine, Ser 05/30/2016 0.78  0.44 - 1.00 mg/dL Final  . Calcium 05/30/2016 8.7* 8.9 - 10.3 mg/dL Final  . GFR calc non Af Amer 05/30/2016 >60  >60 mL/min Final  . GFR calc Af Amer 05/30/2016 >60  >60 mL/min Final   Comment: (NOTE) The eGFR has been  calculated using the CKD EPI equation. This calculation has not been validated in all clinical situations. eGFR's persistently <60 mL/min signify possible Chronic Kidney Disease.   Georgiann Hahn gap 05/30/2016 5  5 - 15 Final  Hospital Outpatient Visit on 05/22/2016  Component Date Value Ref Range Status  . aPTT 05/22/2016 28  24 - 36 seconds Final  . WBC 05/22/2016 9.3  4.0 - 10.5 K/uL Final  . RBC 05/22/2016 4.69  3.87 - 5.11 MIL/uL Final  . Hemoglobin 05/22/2016 12.2  12.0 - 15.0 g/dL Final  . HCT 05/22/2016 38.0  36.0 - 46.0 % Final  . MCV 05/22/2016 81.0  78.0 - 100.0 fL Final  . MCH 05/22/2016 26.0  26.0 - 34.0 pg Final  . MCHC 05/22/2016 32.1  30.0 - 36.0 g/dL Final  . RDW 05/22/2016 15.4  11.5 - 15.5 % Final  . Platelets 05/22/2016 392  150 - 400 K/uL Final  . Sodium 05/22/2016 137  135 - 145 mmol/L Final  . Potassium 05/22/2016 4.5  3.5 - 5.1 mmol/L Final  . Chloride 05/22/2016 104  101 - 111 mmol/L Final  . CO2 05/22/2016 27  22 - 32 mmol/L Final  . Glucose, Bld 05/22/2016 146* 65 - 99 mg/dL Final  . BUN 05/22/2016 15  6 - 20 mg/dL Final  . Creatinine, Ser 05/22/2016 0.94  0.44 - 1.00 mg/dL Final  . Calcium 05/22/2016 9.4  8.9 - 10.3 mg/dL Final  . Total Protein 05/22/2016 6.8  6.5 - 8.1 g/dL Final  . Albumin 05/22/2016 3.9  3.5 - 5.0 g/dL Final  . AST 05/22/2016 30  15 - 41 U/L Final  . ALT 05/22/2016 30  14 - 54 U/L Final  . Alkaline Phosphatase 05/22/2016 72  38 - 126 U/L Final  . Total Bilirubin 05/22/2016 0.4  0.3 - 1.2 mg/dL Final  . GFR calc non Af Amer 05/22/2016 >60  >60 mL/min Final  . GFR calc Af Amer 05/22/2016 >60  >60 mL/min Final   Comment: (NOTE) The eGFR has been calculated using the CKD EPI equation. This calculation has not been validated in all clinical situations. eGFR's persistently <60 mL/min signify possible Chronic Kidney Disease.   . Anion gap 05/22/2016 6  5 - 15 Final  . Prothrombin Time 05/22/2016 13.1  11.4 - 15.2 seconds Final  .  INR  05/22/2016 0.99   Final  . ABO/RH(D) 05/22/2016 A NEG   Final  . Antibody Screen 05/22/2016 NEG   Final  . Sample Expiration 05/22/2016 05/31/2016   Final  . Extend sample reason 05/22/2016 NO TRANSFUSIONS OR PREGNANCY IN THE PAST 3 MONTHS   Final  . MRSA, PCR 05/22/2016 NEGATIVE  NEGATIVE Final  . Staphylococcus aureus 05/22/2016 NEGATIVE  NEGATIVE Final   Comment:        The Xpert SA Assay (FDA approved for NASAL specimens in patients over 72 years of age), is one component of a comprehensive surveillance program.  Test performance has been validated by [facility] for patients greater than or equal to 38 year old. It is not intended to diagnose infection nor to guide or monitor treatment.   Admission on 04/07/2016, Discharged on 04/08/2016  Component Date Value Ref Range Status  . MRSA, PCR 04/07/2016 NEGATIVE  NEGATIVE Final  . Staphylococcus aureus 04/07/2016 NEGATIVE  NEGATIVE Final   Comment:        The Xpert SA Assay (FDA approved for NASAL specimens in patients over 63 years of age), is one component of a comprehensive surveillance program.  Test performance has been validated by [facility] for patients greater than or equal to 53 year old. It is not intended to diagnose infection nor to guide or monitor treatment.   . WBC 04/07/2016 10.8* 4.0 - 10.5 K/uL Final  . RBC 04/07/2016 4.63  3.87 - 5.11 MIL/uL Final  . Hemoglobin 04/07/2016 12.3  12.0 - 15.0 g/dL Final  . HCT 04/07/2016 38.1  36.0 - 46.0 % Final  . MCV 04/07/2016 82.3  78.0 - 100.0 fL Final  . MCH 04/07/2016 26.6  26.0 - 34.0 pg Final  . MCHC 04/07/2016 32.3  30.0 - 36.0 g/dL Final  . RDW 04/07/2016 15.6* 11.5 - 15.5 % Final  . Platelets 04/07/2016 324  150 - 400 K/uL Final  . Sodium 04/07/2016 140  135 - 145 mmol/L Final  . Potassium 04/07/2016 3.7  3.5 - 5.1 mmol/L Final  . Chloride 04/07/2016 106  101 - 111 mmol/L Final  . CO2 04/07/2016 24  22 - 32 mmol/L Final  . Glucose, Bld 04/07/2016 112*  65 - 99 mg/dL Final  . BUN 04/07/2016 14  6 - 20 mg/dL Final  . Creatinine, Ser 04/07/2016 0.84  0.44 - 1.00 mg/dL Final  . Calcium 04/07/2016 9.1  8.9 - 10.3 mg/dL Final  . GFR calc non Af Amer 04/07/2016 >60  >60 mL/min Final  . GFR calc Af Amer 04/07/2016 >60  >60 mL/min Final   Comment: (NOTE) The eGFR has been calculated using the CKD EPI equation. This calculation has not been validated in all clinical situations. eGFR's persistently <60 mL/min signify possible Chronic Kidney Disease.   . Anion gap 04/07/2016 10  5 - 15 Final     X-Rays:No results found.  EKG: Orders placed or performed during the hospital encounter of 04/07/16  . EKG 12-Lead  . EKG 12-Lead  . EKG 12-Lead  . EKG 12-Lead     Hospital Course: Angie Olson is a 66 y.o. who was admitted to [facility]. They were brought to the operating room on 05/28/2016 and underwent Procedure(s): RIGHT TIBIAL [place].  Patient tolerated the procedure well and was later transferred to the recovery room and then to the orthopaedic floor for postoperative care.  They were given PO and IV analgesics for pain control following  their surgery.  They were given 24 hours of postoperative antibiotics of  Anti-infectives    Start     Dose/Rate Route Frequency Ordered Stop   05/29/16 0600  ceFAZolin (ANCEF) IVPB 2g/100 mL premix     2 g 200 mL/hr over 30 Minutes Intravenous On call to O.R. 05/28/16 1257 05/28/16 1625   05/28/16 2200  ceFAZolin (ANCEF) IVPB 2g/100 mL premix     2 g 200 mL/hr over 30 Minutes Intravenous Every 6 hours 05/28/16 1847 05/29/16 0510     and started on DVT prophylaxis in the form of Aspirin.   PT and OT were ordered for total joint protocol.  Discharge planning consulted to help with postop disposition and equipment needs.  Patient had a decent night on the evening of surgery.  They started to get up OOB with therapy on day one. Hemovac drain was pulled without difficulty.   Continued to work with therapy into day two.  Dressing was changed on day two and the incision was healing well. Patient was seen in rounds on POD 2 and was ready to go home.  Discharge home with home health Diet - Cardiac diet Follow up - in 2 weeks Activity - WBAT Disposition - Home Condition Upon Discharge - Good D/C Meds - See DC Summary DVT Prophylaxis - Aspirin325 mg BID for four weeks.   Discharge Instructions    Call MD / Call 911    Complete by:  As directed    If you experience chest pain or shortness of breath, CALL 911 and be transported to the hospital emergency room.  If you develope a fever above 101 F, pus (white drainage) or increased drainage or redness at the wound, or calf pain, call your surgeon's office.   Change dressing    Complete by:  As directed    Change dressing daily with sterile 4 x 4 inch gauze dressing and apply TED hose. Do not submerge the incision under water.   Constipation Prevention    Complete by:  As directed    Drink plenty of fluids.  Prune juice may be helpful.  You may use a stool softener, such as Colace (over the counter) 100 mg twice a day.  Use MiraLax (over the counter) for constipation as needed.   Diet - low sodium heart healthy    Complete by:  As directed    Discharge instructions    Complete by:  As directed    Pick up stool softner and laxative for home use following surgery while on pain medications. Do not submerge incision under water. Please use good hand washing techniques while changing dressing each day. May shower starting three days after surgery. Please use a clean towel to pat the incision dry following showers. Continue to use ice for pain and swelling after surgery. Do not use any lotions or creams on the incision until instructed by your surgeon.  Wear both TED hose on both legs during the day every day for three weeks, but may have off at night at home.  Postoperative Constipation Protocol  Constipation -  defined medically as fewer than three stools per week and severe constipation as less than one stool per week.  One of the most common issues patients have following surgery is constipation.  Even if you have a regular bowel pattern at home, your normal regimen is likely to be disrupted due to multiple reasons following surgery.  Combination of anesthesia, postoperative narcotics, change in appetite and fluid  intake all can affect your bowels.  In order to avoid complications following surgery, here are some recommendations in order to help you during your recovery period.  Colace (docusate) - Pick up an over-the-counter form of Colace or another stool softener and take twice a day as long as you are requiring postoperative pain medications.  Take with a full glass of water daily.  If you experience loose stools or diarrhea, hold the colace until you stool forms back up.  If your symptoms do not get better within 1 week or if they get worse, check with your doctor.  Dulcolax (bisacodyl) - Pick up over-the-counter and take as directed by the product packaging as needed to assist with the movement of your bowels.  Take with a full glass of water.  Use this product as needed if not relieved by Colace only.   MiraLax (polyethylene glycol) - Pick up over-the-counter to have on hand.  MiraLax is a solution that will increase the amount of water in your bowels to assist with bowel movements.  Take as directed and can mix with a glass of water, juice, soda, coffee, or tea.  Take if you go more than two days without a movement. Do not use MiraLax more than once per day. Call your doctor if you are still constipated or irregular after using this medication for 7 days in a row.  If you continue to have problems with postoperative constipation, please contact the office for further assistance and recommendations.  If you experience "the worst abdominal pain ever" or develop nausea or vomiting, please contact the  office immediatly for further recommendations for treatment.   Take a 325 mg Aspirin twice a day for four weeks.   Do not put a pillow under the knee. Place it under the heel.    Complete by:  As directed    Do not sit on low chairs, stoools or toilet seats, as it may be difficult to get up from low surfaces    Complete by:  As directed    Driving restrictions    Complete by:  As directed    No driving until released by the physician.   Increase activity slowly as tolerated    Complete by:  As directed    Lifting restrictions    Complete by:  As directed    No lifting until released by the physician.   Patient may shower    Complete by:  As directed    You may shower without a dressing once there is no drainage.  Do not wash over the wound.  If drainage remains, do not shower until drainage stops.   TED hose    Complete by:  As directed    Use stockings (TED hose) for 3 weeks on both leg(s).  You may remove them at night for sleeping.   Weight bearing as tolerated    Complete by:  As directed    Laterality:  right   Extremity:  Lower     Allergies as of 05/30/2016      Reactions   Methotrexate Derivatives Other (See Comments)   Causes recurrent infections   Pseudoephedrine Other (See Comments)   Makes jittery    Reglan [metoclopramide] Other (See Comments)   insomnia   Vicodin [hydrocodone-acetaminophen] Other (See Comments)   "Makes me feel weird"      Medication List    STOP taking these medications   oxyCODONE-acetaminophen 5-325 MG tablet Commonly known as:  PERCOCET/ROXICET  Vitamin D (Ergocalciferol) 50000 units Caps capsule Commonly known as:  DRISDOL   VOLTAREN 1 % Gel Generic drug:  diclofenac sodium     TAKE these medications   ALPRAZolam 1 MG tablet Commonly known as:  XANAX Take 0.5-1 mg by mouth at bedtime.   amLODipine 5 MG tablet Commonly known as:  NORVASC Take 5 mg by mouth daily.   aspirin 325 MG EC tablet Take 1 tablet (325 mg total)  by mouth 2 (two) times daily.   atorvastatin 20 MG tablet Commonly known as:  LIPITOR Take 20 mg by mouth daily.   cetirizine 10 MG tablet Commonly known as:  ZYRTEC Take 10 mg by mouth daily.   cyclobenzaprine 10 MG tablet Commonly known as:  FLEXERIL Take 10 mg by mouth 3 (three) times daily as needed. For muscle spasms   esomeprazole 20 MG capsule Commonly known as:  NEXIUM Take 20 mg by mouth daily.   fluticasone 50 MCG/ACT nasal spray Commonly known as:  FLONASE Place 2 sprays into both nostrils daily as needed for allergies.   HYDROmorphone 2 MG tablet Commonly known as:  DILAUDID Take 1-2 tablets (2-4 mg total) by mouth every 4 (four) hours as needed for moderate pain or severe pain.   leflunomide 20 MG tablet Commonly known as:  ARAVA Take 20 mg by mouth daily.   lisinopril 40 MG tablet Commonly known as:  PRINIVIL,ZESTRIL Take 40 mg by mouth daily.   naloxegol oxalate 12.5 MG Tabs tablet Commonly known as:  MOVANTIK Take 1 tablet (12.5 mg total) by mouth daily.   nortriptyline 25 MG capsule Commonly known as:  PAMELOR Take 25 mg by mouth at bedtime.   predniSONE 5 MG tablet Commonly known as:  DELTASONE Take 5 mg by mouth daily. Continuous for rheumatoid arthritis   promethazine 25 MG tablet Commonly known as:  PHENERGAN Take 1 tablet (25 mg total) by mouth every 4 (four) hours as needed for nausea or vomiting.   sucralfate 1 g tablet Commonly known as:  CARAFATE Take 1 g by mouth 2 (two) times daily as needed (indigestion).   traMADol 50 MG tablet Commonly known as:  ULTRAM Take 1-2 tablets (50-100 mg total) by mouth every 6 (six) hours as needed (mild pain). What changed:  when to take this  reasons to take this      Follow-up Information    Gearlean Alf, MD. Schedule an appointment as soon as possible for a visit on 06/10/2016.   Specialty:  Orthopedic Surgery Contact information: [address] [place]  [address] [phone]           Signed: [name], PA-C Orthopaedic Surgery 05/30/2016, 8:07 AM

## 2016-05-30 LAB — BASIC METABOLIC PANEL
Anion gap: 5 (ref 5–15)
BUN: 19 mg/dL (ref 6–20)
CO2: 29 mmol/L (ref 22–32)
CREATININE: 0.78 mg/dL (ref 0.44–1.00)
Calcium: 8.7 mg/dL — ABNORMAL LOW (ref 8.9–10.3)
Chloride: 104 mmol/L (ref 101–111)
GFR calc Af Amer: >60 mL/min (ref >60)
GLUCOSE: 196 mg/dL — AB (ref 65–99)
Potassium: 4.2 mmol/L (ref 3.5–5.1)
SODIUM: 138 mmol/L (ref 135–145)

## 2016-05-30 LAB — CBC
HCT: 31.9 % — ABNORMAL LOW (ref 36.0–46.0)
Hemoglobin: 10.6 g/dL — ABNORMAL LOW (ref 12.0–15.0)
MCH: 26.8 pg (ref 26.0–34.0)
MCHC: 33.2 g/dL (ref 30.0–36.0)
MCV: 80.6 fL (ref 78.0–100.0)
PLATELETS: 348 10*3/uL (ref 150–400)
RBC: 3.96 MIL/uL (ref 3.87–5.11)
RDW: 15.5 % (ref 11.5–15.5)
WBC: 15.6 10*3/uL — ABNORMAL HIGH (ref 4.0–10.5)

## 2016-05-30 MED ORDER — NALOXEGOL OXALATE 12.5 MG PO TABS
12.5000 mg | ORAL_TABLET | Freq: Every day | ORAL | Status: DC
  Filled 2016-05-30: qty 1

## 2016-05-30 MED ORDER — HYDROMORPHONE HCL 2 MG PO TABS
2.0000 mg | ORAL_TABLET | ORAL | Status: DC | PRN
  Administered 2016-05-30: 2 mg via ORAL
  Filled 2016-05-30: qty 1

## 2016-05-30 MED ORDER — HYDROMORPHONE HCL 2 MG PO TABS: 2.0000 mg | ORAL_TABLET | ORAL | 0 refills | Status: DC | PRN

## 2016-05-30 MED ORDER — NALOXEGOL OXALATE 12.5 MG PO TABS
12.5000 mg | ORAL_TABLET | Freq: Every day | ORAL | 0 refills | Status: DC
Start: 2016-05-30 — End: 2019-01-07

## 2016-05-30 MED ORDER — ASPIRIN 325 MG PO TBEC: 325.0000 mg | DELAYED_RELEASE_TABLET | Freq: Two times a day (BID) | ORAL | 0 refills | Status: DC

## 2016-05-30 MED ORDER — TRAMADOL HCL 50 MG PO TABS: 50.0000 mg | ORAL_TABLET | Freq: Four times a day (QID) | ORAL | 1 refills | Status: DC | PRN

## 2016-05-30 NOTE — Progress Notes (Signed)
Subjective: 2 Days Post-Op Procedure(s) (LRB): RIGHT TIBIAL POLYETHYLENE KNEE REVISION (Right) Patient reports pain as mild.   Patient seen in rounds for Dr. Wynelle Link. Patient is well, and has had no acute complaints or problems Patient is ready to go home  Objective: Vital signs in last 24 hours: Temp:  [97.8 F (36.6 C)-98.4 F (36.9 C)] 98.4 F (36.9 C) (03/16 0500) Pulse Rate:  [89-100] 98 (03/16 0500) Resp:  [19-20] 19 (03/16 0500) BP: (140-169)/(78-93) 162/78 (03/16 0500) SpO2:  [98 %-99 %] 99 % (03/16 0500)  Intake/Output from previous day:  Intake/Output Summary (Last 24 hours) at 05/30/16 0804 Last data filed at 05/30/16 0600  Gross per 24 hour  Intake              705 ml  Output              650 ml  Net               55 ml    Intake/Output this shift: No intake/output data recorded.  Labs:  Recent Labs  05/29/16 0423 05/30/16 0424  HGB 11.2* 10.6*    Recent Labs  05/29/16 0423 05/30/16 0424  WBC 8.7 15.6*  RBC 4.40 3.96  HCT 35.2* 31.9*  PLT 335 348    Recent Labs  05/29/16 0423 05/30/16 0424  NA 140 138  K 4.7 4.2  CL 105 104  CO2 28 29  BUN 16 19  CREATININE 0.84 0.78  GLUCOSE 236* 196*  CALCIUM 9.3 8.7*   No results for input(s): LABPT, INR in the last 72 hours.  EXAM: General - Patient is Alert, Appropriate and Oriented Extremity - Neurovascular intact Sensation intact distally Intact pulses distally Incision - clean, dry, no drainage Motor Function - intact, moving foot and toes well on exam.   Assessment/Plan: 2 Days Post-Op Procedure(s) (LRB): RIGHT TIBIAL POLYETHYLENE KNEE REVISION (Right) Procedure(s) (LRB): RIGHT TIBIAL POLYETHYLENE KNEE REVISION (Right) Past Medical History:  Diagnosis Date  . Acid reflux   . Allergic rhinitis   . Anxiety    PT'S SON DIED 06-21-2010  . Arthritis    RA AND OA  . Borderline hypercholesterolemia    on medication for TIA  . Bronchitis    CHRONIC   . Colon polyp   . Environmental  allergies   . GERD (gastroesophageal reflux disease)    on PPI and carafte prn  . H/O hiatal hernia   . Hypertension   . IBS (irritable bowel syndrome)   . LBP (low back pain)   . OA (osteoarthritis)    Left Shoulder  . Pain    RIGHT KNEE - TORN MENISCUS AND ACL  . Pneumonia    X 3  - LAST TIME WAS June 21, 2011  . PONV (postoperative nausea and vomiting) 09-2012   sore throat after ankle surgery 6 yrs ago and severe ponv 09-2012  . Pulmonary nodule seen on imaging study   . Shortness of breath    with activity,bronchitis  . Stroke ([place]) 03/2011   TIA--PT EXPERIENCED NUMBNESS RT HAND AND ARM , HEADACHE AND NAUSEA. NO RESIDUAL PROBLEMS   Principal Problem:   Failed total knee arthroplasty, sequela Active Problems:   Failed total knee arthroplasty ([place])  Estimated body mass index is 32.62 kg/m as calculated from the following:   Height as of this encounter: 5\' 5"  (1.651 m).   Weight as of this encounter: 88.9 kg (196 lb). Up with therapy Discharge home with home health Diet -  Cardiac diet Follow up - in 2 weeks Activity - WBAT Disposition - Home Condition Upon Discharge - Good D/C Meds - See DC Summary DVT Prophylaxis - Aspirin 325 mg BID for four weeks.  [name], PA-C Orthopaedic Surgery 05/30/2016, 8:04 AM

## 2016-05-30 NOTE — Progress Notes (Signed)
Physical Therapy Treatment Patient Details Name: Angie Olson MRN: [id number] DOB: Oct 17, 1950 Today's Date: 05/30/2016    History of Present Illness Pt is a 66 y/o female s/p R TKA. 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    PT Comments    The patient practiced steps, reviewed HEP. Ready for DC.   Follow Up Recommendations  Home health PT     Equipment Recommendations  None recommended by PT    Recommendations for Other Services       Precautions / Restrictions Precautions Precautions: Fall;Knee;Back Precaution Comments: HAS A BACK BRace but does not wear Restrictions Weight Bearing Restrictions: No RLE Weight Bearing: Weight bearing as tolerated    Mobility  Bed Mobility Overal bed mobility: Modified Independent                Transfers   Equipment used: Rolling walker (2 wheeled) Transfers: Sit to/from Stand Sit to Stand: Supervision            Ambulation/Gait Ambulation/Gait assistance: Supervision Ambulation Distance (Feet): 100 Feet Assistive device: Rolling walker (2 wheeled) Gait Pattern/deviations: Step-to pattern;Step-through pattern;Decreased step length - right;Decreased stance time - right     General Gait Details: cues for sequence   Stairs Stairs: Yes   Stair Management: One rail Left;Step to pattern;Forwards;With cane Number of Stairs: 2 General stair comments: cues for safety  Wheelchair Mobility    Modified Rankin (Stroke Patients Only)       Balance                                    Cognition Arousal/Alertness: Awake/alert                          Exercises Total Joint Exercises Ankle Circles/Pumps: AROM;Both;10 reps Quad Sets: AROM;Right;10 reps Towel Squeeze: AROM;Right;10 reps Heel Slides: AAROM;Right;10 reps Hip ABduction/ADduction:  AROM Straight Leg Raises: AAROM;Right;10 reps    General Comments        Pertinent Vitals/Pain Pain Score: 7  Pain Location: R knee Pain Descriptors / Indicators: Aching Pain Intervention(s): Premedicated before session;Repositioned;Ice applied    Home Living                      Prior Function            PT Goals (current goals can now be found in the care plan section) Progress towards PT goals: Progressing toward goals    Frequency           PT Plan Current plan remains appropriate    Co-evaluation             End of Session   Activity Tolerance: Patient tolerated treatment well Patient left: in bed;with call bell/phone within reach;with family/visitor present Nurse Communication: Mobility status PT Visit Diagnosis: Muscle weakness (generalized) (M62.81);Pain Pain - Right/Left: Right Pain - part of body: Knee     Time: 1000-1046 PT Time Calculation (min) (ACUTE ONLY): 46 min  Charges:  $Gait Training: 8-22 mins $Therapeutic Exercise: 8-22 mins $Self Care/Home Management: 2022-11-24                    G Codes:       Claretha Cooper 05/30/2016, 1:16 PM

## 2016-05-30 NOTE — Progress Notes (Signed)
Rn reviewed discharge instructions with patient and family. All questions answered.   Paperwork and prescriptions given to patient.   NT rolled down with all belongings to family car.

## 2016-08-15 NOTE — Addendum Note (Signed)
Addendum  created 08/15/16 [id number] by Effie Berkshire, MD   Sign clinical note

## 2016-09-19 ENCOUNTER — Telehealth: Payer: Self-pay

## 2016-09-19 NOTE — Telephone Encounter (Signed)
Received surgical clearance request from [place] for R hip THA w/wo autograft/allograft to be completed 10/01/16. Patient has not been seen since 2016 and had a negative work-up. She was instructed to follow-up with Cardiology PRN.  Left message with [facility] to discuss if cardiac clearance is necessary.  Phone: [phone] Fax: [phone]

## 2016-09-19 NOTE — Telephone Encounter (Signed)
Angie Olson returned call.  Informed her that cardiac work up was negative in 2016 when patient was cleared for hip surgery and was told to follow-up PRN. She has not contacted Cardiology since.  Angie Olson states she does not think cardiac clearance will be necessary. She will call back if it is needed. She was grateful for assistance.

## 2016-09-19 NOTE — Progress Notes (Signed)
Please place orders in EPIC as patient is being scheduled for a pre-op appointment! Thank you! 

## 2016-09-23 ENCOUNTER — Ambulatory Visit: Payer: Self-pay | Admitting: Orthopedic Surgery

## 2016-09-24 NOTE — Patient Instructions (Addendum)
Angie Olson  09/25/2016   Your procedure is scheduled on: 10/01/16  Report to [facility] Main  Entrance              Report to admitting at       745AM   Call this number if you have problems the morning of surgery  [phone]   Remember: ONLY 1 PERSON MAY GO WITH YOU TO SHORT STAY TO GET  READY MORNING OF YOUR SURGERY.  Do not eat food or drink liquids :After Midnight.     Take these medicines the morning of surgery with A SIP OF WATER: Leflunomide, nexium, zyrtec, atorvastatin,              amlodipine                                You may not have any metal on your body including hair pins and              piercings  Do not wear jewelry, make-up, lotions, powders or perfumes, deodorant             Do not wear nail polish.  Do not shave  48 hours prior to surgery.               Do not bring valuables to the hospital. [place].  Contacts, dentures or bridgework may not be worn into surgery.  Leave suitcase in the car. After surgery it may be brought to your room.               Please read over the following fact sheets you were given: _____________________________________________________________________            [facility] - Preparing for Surgery Before surgery, you can play an important role.  Because skin is not sterile, your skin needs to be as free of germs as possible.  You can reduce the number of germs on your skin by washing with CHG (chlorahexidine gluconate) soap before surgery.  CHG is an antiseptic cleaner which kills germs and bonds with the skin to continue killing germs even after washing. Please DO NOT use if you have an allergy to CHG or antibacterial soaps.  If your skin becomes reddened/irritated stop using the CHG and inform your nurse when you arrive at Short Stay. Do not shave (including legs and underarms) for at least 48 hours prior to the first CHG shower.  You may  shave your face/neck. Please follow these instructions carefully:  1.  Shower with CHG Soap the night before surgery and the  morning of Surgery.  2.  If you choose to wash your hair, wash your hair first as usual with your  normal  shampoo.  3.  After you shampoo, rinse your hair and body thoroughly to remove the  shampoo.                           4.  Use CHG as you would any other liquid soap.  You can apply chg directly  to the skin and wash  Gently with a scrungie or clean washcloth.  5.  Apply the CHG Soap to your body ONLY FROM THE NECK DOWN.   Do not use on face/ open                           Wound or open sores. Avoid contact with eyes, ears mouth and genitals (private parts).                       Wash face,  Genitals (private parts) with your normal soap.             6.  Wash thoroughly, paying special attention to the area where your surgery  will be performed.  7.  Thoroughly rinse your body with warm water from the neck down.  8.  DO NOT shower/wash with your normal soap after using and rinsing off  the CHG Soap.                9.  Pat yourself dry with a clean towel.            10.  Wear clean pajamas.            11.  Place clean sheets on your bed the night of your first shower and do not  sleep with pets. Day of Surgery : Do not apply any lotions/deodorants the morning of surgery.  Please wear clean clothes to the hospital/surgery center.  FAILURE TO FOLLOW THESE INSTRUCTIONS MAY RESULT IN THE CANCELLATION OF YOUR SURGERY PATIENT SIGNATURE_________________________________  NURSE SIGNATURE__________________________________  ________________________________________________________________________  WHAT IS A BLOOD TRANSFUSION? Blood Transfusion Information  A transfusion is the replacement of blood or some of its parts. Blood is made up of multiple cells which provide different functions.  Red blood cells carry oxygen and are used for blood loss  replacement.  White blood cells fight against infection.  Platelets control bleeding.  Plasma helps clot blood.  Other blood products are available for specialized needs, such as hemophilia or other clotting disorders. BEFORE THE TRANSFUSION  Who gives blood for transfusions?   Healthy volunteers who are fully evaluated to make sure their blood is safe. This is blood bank blood. Transfusion therapy is the safest it has ever been in the practice of medicine. Before blood is taken from a donor, a complete history is taken to make sure that person has no history of diseases nor engages in risky social behavior (examples are intravenous drug use or sexual activity with multiple partners). The donor's travel history is screened to minimize risk of transmitting infections, such as malaria. The donated blood is tested for signs of infectious diseases, such as HIV and hepatitis. The blood is then tested to be sure it is compatible with you in order to minimize the chance of a transfusion reaction. If you or a relative donates blood, this is often done in anticipation of surgery and is not appropriate for emergency situations. It takes many days to process the donated blood. RISKS AND COMPLICATIONS Although transfusion therapy is very safe and saves many lives, the main dangers of transfusion include:   Getting an infectious disease.  Developing a transfusion reaction. This is an allergic reaction to something in the blood you were given. Every precaution is taken to prevent this. The decision to have a blood transfusion has been considered carefully by your caregiver before blood is given. Blood is not given unless the benefits outweigh  the risks. AFTER THE TRANSFUSION  Right after receiving a blood transfusion, you will usually feel much better and more energetic. This is especially true if your red blood cells have gotten low (anemic). The transfusion raises the level of the red blood cells which  carry oxygen, and this usually causes an energy increase.  The nurse administering the transfusion will monitor you carefully for complications. HOME CARE INSTRUCTIONS  No special instructions are needed after a transfusion. You may find your energy is better. Speak with your caregiver about any limitations on activity for underlying diseases you may have. SEEK MEDICAL CARE IF:   Your condition is not improving after your transfusion.  You develop redness or irritation at the intravenous (IV) site. SEEK IMMEDIATE MEDICAL CARE IF:  Any of the following symptoms occur over the next 12 hours:  Shaking chills.  You have a temperature by mouth above 102 F (38.9 C), not controlled by medicine.  Chest, back, or muscle pain.  People around you feel you are not acting correctly or are confused.  Shortness of breath or difficulty breathing.  Dizziness and fainting.  You get a rash or develop hives.  You have a decrease in urine output.  Your urine turns a dark color or changes to pink, red, or brown. Any of the following symptoms occur over the next 10 days:  You have a temperature by mouth above 102 F (38.9 C), not controlled by medicine.  Shortness of breath.  Weakness after normal activity.  The white part of the eye turns yellow (jaundice).  You have a decrease in the amount of urine or are urinating less often.  Your urine turns a dark color or changes to pink, red, or brown. Document Released: 02/29/2000 Document Revised: 05/26/2011 Document Reviewed: 10/18/2007 ExitCare Patient Information 2014 [place].  _______________________________________________________________________  Incentive Spirometer  An incentive spirometer is a tool that can help keep your lungs clear and active. This tool measures how well you are filling your lungs with each breath. Taking long deep breaths may help reverse or decrease the chance of developing breathing (pulmonary) problems  (especially infection) following:  A long period of time when you are unable to move or be active. BEFORE THE PROCEDURE   If the spirometer includes an indicator to show your best effort, your nurse or respiratory therapist will set it to a desired goal.  If possible, sit up straight or lean slightly forward. Try not to slouch.  Hold the incentive spirometer in an upright position. INSTRUCTIONS FOR USE  1. Sit on the edge of your bed if possible, or sit up as far as you can in bed or on a chair. 2. Hold the incentive spirometer in an upright position. 3. Breathe out normally. 4. Place the mouthpiece in your mouth and seal your lips tightly around it. 5. Breathe in slowly and as deeply as possible, raising the piston or the ball toward the top of the column. 6. Hold your breath for 3-5 seconds or for as long as possible. Allow the piston or ball to fall to the bottom of the column. 7. Remove the mouthpiece from your mouth and breathe out normally. 8. Rest for a few seconds and repeat Steps 1 through 7 at least 10 times every 1-2 hours when you are awake. Take your time and take a few normal breaths between deep breaths. 9. The spirometer may include an indicator to show your best effort. Use the indicator as a goal to work  toward during each repetition. 10. After each set of 10 deep breaths, practice coughing to be sure your lungs are clear. If you have an incision (the cut made at the time of surgery), support your incision when coughing by placing a pillow or rolled up towels firmly against it. Once you are able to get out of bed, walk around indoors and cough well. You may stop using the incentive spirometer when instructed by your caregiver.  RISKS AND COMPLICATIONS  Take your time so you do not get dizzy or light-headed.  If you are in pain, you may need to take or ask for pain medication before doing incentive spirometry. It is harder to take a deep breath if you are having  pain. AFTER USE  Rest and breathe slowly and easily.  It can be helpful to keep track of a log of your progress. Your caregiver can provide you with a simple table to help with this. If you are using the spirometer at home, follow these instructions: [place] IF:   You are having difficultly using the spirometer.  You have trouble using the spirometer as often as instructed.  Your pain medication is not giving enough relief while using the spirometer.  You develop fever of 100.5 F (38.1 C) or higher. SEEK IMMEDIATE MEDICAL CARE IF:   You cough up bloody sputum that had not been present before.  You develop fever of 102 F (38.9 C) or greater.  You develop worsening pain at or near the incision site. MAKE SURE YOU:   Understand these instructions.  Will watch your condition.  Will get help right away if you are not doing well or get worse. Document Released: 07/14/2006 Document Revised: 05/26/2011 Document Reviewed: 09/14/2006 [facility] Patient Information 2014 [place], [place].   ________________________________________________________________________

## 2016-09-24 NOTE — Progress Notes (Addendum)
Prior note entered in error.

## 2016-09-25 ENCOUNTER — Encounter (HOSPITAL_COMMUNITY): Payer: Self-pay

## 2016-09-26 ENCOUNTER — Encounter (HOSPITAL_COMMUNITY)
Admission: RE | Admit: 2016-09-26 | Discharge: 2016-09-26 | Disposition: A | Discharge status: Home / Self Care | Payer: Medicare Other | Source: Ambulatory Visit | Attending: Orthopedic Surgery | Admitting: Orthopedic Surgery

## 2016-09-26 ENCOUNTER — Encounter (HOSPITAL_COMMUNITY): Payer: Self-pay

## 2016-09-26 ENCOUNTER — Encounter (INDEPENDENT_AMBULATORY_CARE_PROVIDER_SITE_OTHER): Payer: Self-pay

## 2016-09-26 DIAGNOSIS — Z01818 Encounter for other preprocedural examination: Secondary | ICD-10-CM | POA: Insufficient documentation

## 2016-09-26 DIAGNOSIS — M1611 Unilateral primary osteoarthritis, right hip: Secondary | ICD-10-CM | POA: Diagnosis not present

## 2016-09-26 HISTORY — DX: Personal history of urinary calculi: Z87.442

## 2016-09-26 LAB — CBC
HEMATOCRIT: 38.1 % (ref 36.0–46.0)
Hemoglobin: 12.3 g/dL (ref 12.0–15.0)
MCH: 25.9 pg — ABNORMAL LOW (ref 26.0–34.0)
MCHC: 32.3 g/dL (ref 30.0–36.0)
MCV: 80.4 fL (ref 78.0–100.0)
PLATELETS: 349 10*3/uL (ref 150–400)
RBC: 4.74 MIL/uL (ref 3.87–5.11)
RDW: 16 % — AB (ref 11.5–15.5)
WBC: 8.5 10*3/uL (ref 4.0–10.5)

## 2016-09-26 LAB — SURGICAL PCR SCREEN
MRSA, PCR: NEGATIVE
Staphylococcus aureus: NEGATIVE

## 2016-09-26 LAB — COMPREHENSIVE METABOLIC PANEL
ALBUMIN: 4 g/dL (ref 3.5–5.0)
ALT: 22 U/L (ref 14–54)
ANION GAP: 6 (ref 5–15)
AST: 23 U/L (ref 15–41)
Alkaline Phosphatase: 66 U/L (ref 38–126)
BILIRUBIN TOTAL: 0.4 mg/dL (ref 0.3–1.2)
BUN: 22 mg/dL — ABNORMAL HIGH (ref 6–20)
CHLORIDE: 108 mmol/L (ref 101–111)
CO2: 30 mmol/L (ref 22–32)
Calcium: 9.3 mg/dL (ref 8.9–10.3)
Creatinine, Ser: 0.94 mg/dL (ref 0.44–1.00)
GFR calc Af Amer: >60 mL/min (ref >60)
GFR calc non Af Amer: >60 mL/min (ref >60)
GLUCOSE: 167 mg/dL — AB (ref 65–99)
POTASSIUM: 4.7 mmol/L (ref 3.5–5.1)
Sodium: 144 mmol/L (ref 135–145)
TOTAL PROTEIN: 6.9 g/dL (ref 6.5–8.1)

## 2016-09-26 LAB — APTT: aPTT: 26 seconds (ref 24–36)

## 2016-09-26 LAB — PROTIME-INR
INR: 1
Prothrombin Time: 13.2 seconds (ref 11.4–15.2)

## 2016-09-26 NOTE — Progress Notes (Signed)
cmp done 09/26/16 routed to Dr. Wynelle Link via epic

## 2016-09-26 NOTE — Progress Notes (Signed)
EKG 04/07/2016 in epic

## 2016-09-26 NOTE — Progress Notes (Signed)
Per Dr. Ola Spurr no EKG needs to be repeated since pt. Had one 04/07/16 with no change since last EKG. And no CXR  needed per clearance recomendations unless pt. Shows changes.

## 2016-09-30 NOTE — H&P (Signed)
TOTAL HIP ADMISSION H&P  Patient is admitted for right total hip arthroplasty.  Subjective:  Chief Complaint: right hip pain  HPI: Angie Olson, 66 y.o. female, has a history of pain and functional disability in the right hip(s) due to arthritis and patient has failed non-surgical conservative treatments for greater than 12 weeks to include NSAID's and/or analgesics, corticosteriod injections, flexibility and strengthening excercises, use of assistive devices and activity modification.  Onset of symptoms was gradual starting 2 years ago with gradually worsening course since that time.The patient noted no past surgery on the right hip(s).  Patient currently rates pain in the right hip at 8 out of 10 with activity. Patient has night pain, worsening of pain with activity and weight bearing, pain that interfers with activities of daily living and pain with passive range of motion. Patient has evidence of subchondral sclerosis, periarticular osteophytes and joint space narrowing by imaging studies. This condition presents safety issues increasing the risk of falls. There is no current active infection.  Patient Active Problem List   Diagnosis Date Noted  . 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 Failed total knee arthroplasty ([place]) 05/28/2016  . 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 OA (osteoarthritis) of hip 04/25/2015  . Colitis 07/19/2014  . Lactic acidosis 07/19/2014  . SIRS (systemic inflammatory response syndrome) ([place]) 07/19/2014  . Hypotension arterial 07/18/2014  . Hypovolemia dehydration 07/18/2014  . AKI (acute kidney injury) ([place]) 07/18/2014  . Chronic pain 07/18/2014  . Hypotension (arterial) 07/18/2014  . Lumbar pseudoarthrosis 07/05/2014  . Epidural hematoma ([place]) 01/11/2014  . HNP (herniated nucleus pulposus), lumbar 12/05/2013  . OA (osteoarthritis) of knee 07/25/2013  . DOE (dyspnea on exertion) 06/23/2013  . HTN (hypertension) 06/23/2013  . Acute  medial meniscal tear 10/13/2012  . SINUSITIS 12/24/2007  . PULMONARY NODULE 12/24/2007  . COUGH 12/24/2007  . COLONIC POLYPS, ADENOMATOUS, HX OF 10/19/2007  . ALLERGIC RHINITIS 12/07/2006  . GERD 12/07/2006   Past Medical History:  Diagnosis Date  . Acid reflux   . Allergic rhinitis   . Anxiety    PT'S SON DIED 2010-07-01  . Arthritis    RA AND OA  . Borderline hypercholesterolemia    on medication for TIA  . Bronchitis    CHRONIC   . Colon polyp   . Environmental allergies   . GERD (gastroesophageal reflux disease)    on PPI and carafte prn  . H/O hiatal hernia   . History of kidney stones   . Hypertension   . IBS (irritable bowel syndrome)   . LBP (low back pain)   . OA (osteoarthritis)    Left Shoulder  . Pain    RIGHT KNEE - TORN MENISCUS AND ACL  . Pneumonia    X 3  - LAST TIME WAS 01-Jul-2011  . PONV (postoperative nausea and vomiting) 09-2012   sore throat after ankle surgery 6 yrs ago and severe ponv 09-2012  . Pulmonary nodule seen on imaging study   . Shortness of breath    with activity,bronchitis  . Stroke ([place]) 03/2011   TIA--PT EXPERIENCED NUMBNESS RT HAND AND ARM , HEADACHE AND NAUSEA. NO RESIDUAL PROBLEMS    Past Surgical History:  Procedure Laterality Date  . ANKLE SURGERY  ~10 years ago   left  . BACK SURGERY  30-Jun-2009   lower  . CHOLECYSTECTOMY  ~15 years ago  . COLONOSCOPY  [id number], 2011-07-01  . HARDWARE REMOVAL N/A 04/07/2016   Procedure: HARDWARE REMOVAL;  Surgeon: Kary Kos, MD;  Location: [facility];  Service: Neurosurgery;  Laterality: N/A;  . HARVEST BONE GRAFT N/A 07/05/2014   Procedure: HARVEST ILIAC BONE GRAFT;  Surgeon: Kary Kos, MD;  Location: [place] [facility];  Service: Neurosurgery;  Laterality: N/A;  . KNEE ARTHROSCOPY Right 10/13/2012   Procedure: RIGHT KNEE ARTHROSCOPY WITH DEBRIDEMENT, CHONDROPLASTY;  Surgeon: Gearlean Alf, MD;  Location: [facility];  Service: Orthopedics;  Laterality: Right;  . KNEE ARTHROSCOPY Right 03/02/2013   Procedure: RIGHT  ARTHROSCOPY KNEE WITH MEDIAL MENISCAL  DEBRIDEMENT;  Surgeon: Gearlean Alf, MD;  Location: [facility];  Service: Orthopedics;  Laterality: Right;  . LUMBAR FUSION  07/05/2014  . LUMBAR WOUND DEBRIDEMENT N/A 01/11/2014   Procedure: Irrigation and Debridement Lumbar Wound for hematoma;  Surgeon: Elaina Hoops, MD;  Location: [place] [facility];  Service: Neurosurgery;  Laterality: N/A;  . MAXIMUM ACCESS (MAS)POSTERIOR LUMBAR INTERBODY FUSION (PLIF) 1 LEVEL N/A 12/05/2013   Procedure: FOR MAXIMUM ACCESS (MAS) POSTERIOR LUMBAR INTERBODY FUSION (PLIF)LUMBAR FIVE-SACRAL-ONE,REMOVAL HARDWARE LUMBAR FOUR-FIVE;  Surgeon: Elaina Hoops, MD;  Location: [place] [facility];  Service: Neurosurgery;  Laterality: N/A;  . Mohr's procedure  02/2015   face  . Right Hip arthroplasty     10/01/16 Dr. Wynelle Link  . ROOT CANAL    . SHOULDER SURGERY Left ~14 years ago   "scope"  . thumb surgery Left ~14 years ago   x2  . TOTAL HIP ARTHROPLASTY Left 04/25/2015   Procedure: LEFT TOTAL HIP ARTHROPLASTY ANTERIOR APPROACH;  Surgeon: Gaynelle Arabian, MD;  Location: [facility];  Service: Orthopedics;  Laterality: Left;  . TOTAL KNEE ARTHROPLASTY Right 07/25/2013   Procedure: RIGHT TOTAL KNEE ARTHROPLASTY;  Surgeon: Gearlean Alf, MD;  [facility];  Service: Orthopedics;  Laterality: Right;  . TOTAL KNEE ARTHROPLASTY WITH REVISION COMPONENTS Right 05/28/2016   Procedure: RIGHT TIBIAL POLYETHYLENE KNEE REVISION;  Surgeon: Gaynelle Arabian, MD;  Location: [facility];  Service: Orthopedics;  Laterality: Right;  requests 33mins  . TRIGGER FINGER RELEASE  ~6 years ago   X2 ON RIGHT HAND  . TUBAL LIGATION        Current Outpatient Prescriptions:  .  acetaminophen (TYLENOL 8 HOUR ARTHRITIS PAIN) 650 MG CR tablet, Take 1,300 mg by mouth every 8 (eight) hours as needed for pain., Disp: , Rfl:  .  ALPRAZolam (XANAX) 1 MG tablet, Take 0.5-1 mg by mouth at bedtime. , Disp: , Rfl:  .  amLODipine (NORVASC) 5 MG tablet, Take 5 mg by mouth daily. , Disp: , Rfl:  .   atorvastatin (LIPITOR) 20 MG tablet, Take 20 mg by mouth daily. , Disp: , Rfl:  .  cetirizine (ZYRTEC) 10 MG tablet, Take 10 mg by mouth daily., Disp: , Rfl:  .  esomeprazole (NEXIUM) 20 MG capsule, Take 20 mg by mouth daily before breakfast. , Disp: , Rfl:  .  fluticasone (FLONASE) 50 MCG/ACT nasal spray, Place 2 sprays into both nostrils daily as needed for allergies. , Disp: , Rfl:  .  ibuprofen (ADVIL,MOTRIN) 200 MG tablet, Take 400 mg by mouth every 8 (eight) hours as needed (for pain.)., Disp: , Rfl:  .  leflunomide (ARAVA) 20 MG tablet, Take 20 mg by mouth daily., Disp: , Rfl:  .  lisinopril (PRINIVIL,ZESTRIL) 40 MG tablet, Take 40 mg by mouth daily., Disp: , Rfl:  .  nortriptyline (PAMELOR) 25 MG capsule, Take 25 mg by mouth at bedtime. , Disp: , Rfl: 2 .  predniSONE (DELTASONE) 5 MG  tablet, Take 5 mg by mouth daily. Continuous for rheumatoid arthritis,  .  sucralfate (CARAFATE) 1 G tablet, Take 1 g by mouth 3 (three) times daily as needed (indigestion). ,  .  traMADol (ULTRAM) 50 MG tablet, Take 1-2 tablets (50-100 mg total) by mouth every 6 (six) hours as needed (mild pain)., Disp: 56 tablet, Rfl: 1    Allergies  Allergen Reactions  . Methotrexate Derivatives Other (See Comments)    Causes recurrent infections  . Pseudoephedrine Other (See Comments)    Makes jittery   . Reglan [Metoclopramide] Other (See Comments)    insomnia  . Vicodin [Hydrocodone-Acetaminophen] Other (See Comments)    "Makes me feel weird"  . Oxycodone Itching and Rash    Patient tolerated hydromorphone without reaction.    Social History  Substance Use Topics  . Smoking status: Never Smoker  . Smokeless tobacco: Never Used  . Alcohol use No    Family History  Problem Relation Age of Onset  . Heart disease Sister   . Lung cancer Brother   . Heart disease Brother   . CAD Brother   . Heart attack Mother   . Emphysema Father   . Hypertension Daughter   . Colon cancer Neg Hx      Review of  Systems  Constitutional: Positive for diaphoresis and malaise/fatigue. Negative for chills, fever and weight loss.  HENT: Negative.   Eyes: Negative.   Respiratory: Negative.   Cardiovascular: Negative.   Gastrointestinal: Positive for diarrhea and heartburn. Negative for abdominal pain, blood in stool, constipation, melena, nausea and vomiting.  Musculoskeletal: Positive for back pain, joint pain and myalgias. Negative for falls and neck pain.  Skin: Negative.   Neurological: Negative.  Negative for weakness.  Endo/Heme/Allergies: Positive for environmental allergies. Negative for polydipsia. Does not bruise/bleed easily.  Psychiatric/Behavioral: Negative.     Objective:  Physical Exam  Constitutional: She is oriented to person, place, and time. She appears well-developed. No distress.  Overweight  HENT:  Head: Normocephalic and atraumatic.  Right Ear: External ear normal.  Left Ear: External ear normal.  Nose: Nose normal.  Mouth/Throat: Oropharynx is clear and moist.  Eyes: Conjunctivae and EOM are normal.  Neck: Normal range of motion. Neck supple.  Cardiovascular: Normal rate, regular rhythm, normal heart sounds and intact distal pulses.   No murmur heard. Respiratory: Effort normal and breath sounds normal. No respiratory distress. She has no wheezes.  GI: Soft. Bowel sounds are normal. She exhibits no distension. There is no tenderness.  Musculoskeletal:       Right hip: She exhibits decreased range of motion and decreased strength.       Left hip: Normal.  Neurological: She is alert and oriented to person, place, and time. She has normal strength. No sensory deficit.  Skin: No rash noted. She is not diaphoretic. No erythema.  Psychiatric: She has a normal mood and affect. Her behavior is normal.    Vitals  Weight: 190 lb Height: 65in Body Surface Area: 1.94 m Body Mass Index: 31.62 kg/m  Pulse: 88 (Regular)  BP: 122/70 (Sitting, Left Arm,  Standard)  Imaging Review Plain radiographs demonstrate severe degenerative joint disease of the right hip(s). The bone quality appears to be good for age and reported activity level.  Assessment/Plan:  End stage primary osteoarthritis, right hip(s)  The patient history, physical examination, clinical judgement of the provider and imaging studies are consistent with end stage degenerative joint disease of the right hip(s) and total hip  arthroplasty is deemed medically necessary. The treatment options including medical management, injection therapy, arthroscopy and arthroplasty were discussed at length. The risks and benefits of total hip arthroplasty were presented and reviewed. The risks due to aseptic loosening, infection, stiffness, dislocation/subluxation,  thromboembolic complications and other imponderables were discussed.  The patient acknowledged the explanation, agreed to proceed with the plan and consent was signed. Patient is being admitted for inpatient treatment for surgery, pain control, PT, OT, prophylactic antibiotics, VTE prophylaxis, progressive ambulation and ADL's and discharge planning.The patient is planning to be discharged home with home health services vs home exercise program depending on progress in hospital.   PCP: Dr. Velna Hatchet Therapy Plans: HHPT vs HEP Home with husband DME: has all equipment Other: can tolerate Dilaudid    Ardeen Jourdain, PA-C

## 2016-10-01 ENCOUNTER — Inpatient Hospital Stay (HOSPITAL_COMMUNITY): Payer: Medicare Other

## 2016-10-01 ENCOUNTER — Inpatient Hospital Stay (HOSPITAL_COMMUNITY): Payer: Medicare Other | Admitting: Anesthesiology

## 2016-10-01 ENCOUNTER — Encounter (HOSPITAL_COMMUNITY)
Admission: RE | Disposition: A | Discharge status: Home Health | Payer: Self-pay | Source: Ambulatory Visit | Attending: Orthopedic Surgery

## 2016-10-01 ENCOUNTER — Encounter (HOSPITAL_COMMUNITY): Payer: Self-pay | Admitting: *Deleted

## 2016-10-01 ENCOUNTER — Inpatient Hospital Stay (HOSPITAL_COMMUNITY)
Admission: RE | Admit: 2016-10-01 | Discharge: 2016-10-03 | DRG: 470 | Disposition: A | Discharge status: Home Health | Payer: Medicare Other | Source: Ambulatory Visit | Attending: Orthopedic Surgery | Admitting: Orthopedic Surgery

## 2016-10-01 DIAGNOSIS — Z419 Encounter for procedure for purposes other than remedying health state, unspecified: Secondary | ICD-10-CM

## 2016-10-01 DIAGNOSIS — Z96642 Presence of left artificial hip joint: Secondary | ICD-10-CM | POA: Diagnosis present

## 2016-10-01 DIAGNOSIS — Z6831 Body mass index (BMI) 31.0-31.9, adult: Secondary | ICD-10-CM

## 2016-10-01 DIAGNOSIS — Z7951 Long term (current) use of inhaled steroids: Secondary | ICD-10-CM | POA: Diagnosis not present

## 2016-10-01 DIAGNOSIS — K219 Gastro-esophageal reflux disease without esophagitis: Secondary | ICD-10-CM | POA: Diagnosis present

## 2016-10-01 DIAGNOSIS — I10 Essential (primary) hypertension: Secondary | ICD-10-CM | POA: Diagnosis present

## 2016-10-01 DIAGNOSIS — E7800 Pure hypercholesterolemia, unspecified: Secondary | ICD-10-CM | POA: Diagnosis present

## 2016-10-01 DIAGNOSIS — E663 Overweight: Secondary | ICD-10-CM | POA: Diagnosis present

## 2016-10-01 DIAGNOSIS — Z7952 Long term (current) use of systemic steroids: Secondary | ICD-10-CM | POA: Diagnosis not present

## 2016-10-01 DIAGNOSIS — Z96649 Presence of unspecified artificial hip joint: Secondary | ICD-10-CM

## 2016-10-01 DIAGNOSIS — Z96651 Presence of right artificial knee joint: Secondary | ICD-10-CM | POA: Diagnosis present

## 2016-10-01 DIAGNOSIS — Z8673 Personal history of transient ischemic attack (TIA), and cerebral infarction without residual deficits: Secondary | ICD-10-CM | POA: Diagnosis not present

## 2016-10-01 DIAGNOSIS — Z79899 Other long term (current) drug therapy: Secondary | ICD-10-CM | POA: Diagnosis not present

## 2016-10-01 DIAGNOSIS — M069 Rheumatoid arthritis, unspecified: Secondary | ICD-10-CM | POA: Diagnosis present

## 2016-10-01 DIAGNOSIS — M169 Osteoarthritis of hip, unspecified: Secondary | ICD-10-CM | POA: Diagnosis present

## 2016-10-01 DIAGNOSIS — M1611 Unilateral primary osteoarthritis, right hip: Secondary | ICD-10-CM | POA: Diagnosis present

## 2016-10-01 HISTORY — PX: TOTAL HIP ARTHROPLASTY: SHX124

## 2016-10-01 LAB — TYPE AND SCREEN
ABO/RH(D): A NEG
ANTIBODY SCREEN: NEGATIVE

## 2016-10-01 SURGERY — ARTHROPLASTY, HIP, TOTAL, ANTERIOR APPROACH
Anesthesia: Spinal | Site: Hip | Laterality: Right

## 2016-10-01 MED ORDER — LIDOCAINE HCL (CARDIAC) 20 MG/ML IV SOLN
INTRAVENOUS | Status: DC | PRN
  Administered 2016-10-01: 50 mg via INTRAVENOUS

## 2016-10-01 MED ORDER — DOCUSATE SODIUM 100 MG PO CAPS
100.0000 mg | ORAL_CAPSULE | Freq: Two times a day (BID) | ORAL | Status: DC
  Administered 2016-10-01 – 2016-10-03 (×4): 100 mg via ORAL
  Filled 2016-10-01 (×4): qty 1

## 2016-10-01 MED ORDER — HYDROMORPHONE HCL-NACL 0.5-0.9 MG/ML-% IV SOSY
PREFILLED_SYRINGE | INTRAVENOUS | Status: AC
  Filled 2016-10-01: qty 2

## 2016-10-01 MED ORDER — CEFAZOLIN SODIUM-DEXTROSE 2-4 GM/100ML-% IV SOLN
2.0000 g | Freq: Four times a day (QID) | INTRAVENOUS | Status: AC
  Administered 2016-10-01 (×2): 2 g via INTRAVENOUS
  Filled 2016-10-01 (×2): qty 100

## 2016-10-01 MED ORDER — ACETAMINOPHEN 10 MG/ML IV SOLN
1000.0000 mg | Freq: Once | INTRAVENOUS | Status: AC
  Administered 2016-10-01: 1000 mg via INTRAVENOUS

## 2016-10-01 MED ORDER — PHENYLEPHRINE HCL 10 MG/ML IJ SOLN
INTRAVENOUS | Status: DC | PRN
  Administered 2016-10-01: 50 ug/min via INTRAVENOUS

## 2016-10-01 MED ORDER — HYDROMORPHONE HCL-NACL 0.5-0.9 MG/ML-% IV SOSY
0.2500 mg | PREFILLED_SYRINGE | INTRAVENOUS | Status: DC | PRN
  Administered 2016-10-01 (×2): 0.5 mg via INTRAVENOUS

## 2016-10-01 MED ORDER — PROPOFOL 10 MG/ML IV BOLUS
INTRAVENOUS | Status: AC
  Filled 2016-10-01: qty 20

## 2016-10-01 MED ORDER — PREDNISONE 10 MG PO TABS
10.0000 mg | ORAL_TABLET | Freq: Two times a day (BID) | ORAL | Status: AC
  Administered 2016-10-02 (×2): 10 mg via ORAL
  Filled 2016-10-01 (×2): qty 1
  Filled 2016-10-01 (×2): qty 2

## 2016-10-01 MED ORDER — DIPHENHYDRAMINE HCL 50 MG/ML IJ SOLN
INTRAMUSCULAR | Status: AC
  Filled 2016-10-01: qty 1

## 2016-10-01 MED ORDER — PHENYLEPHRINE HCL 10 MG/ML IJ SOLN
INTRAMUSCULAR | Status: AC
  Filled 2016-10-01: qty 1

## 2016-10-01 MED ORDER — POLYETHYLENE GLYCOL 3350 17 G PO PACK
17.0000 g | PACK | Freq: Every day | ORAL | Status: DC | PRN
Start: 2016-10-01 — End: 2016-10-03
  Filled 2016-10-01: qty 1

## 2016-10-01 MED ORDER — CHLORHEXIDINE GLUCONATE 4 % EX LIQD: 60.0000 mL | Freq: Once | CUTANEOUS | Status: DC

## 2016-10-01 MED ORDER — CEFAZOLIN SODIUM-DEXTROSE 2-4 GM/100ML-% IV SOLN
2.0000 g | INTRAVENOUS | Status: AC
  Administered 2016-10-01: 2 g via INTRAVENOUS

## 2016-10-01 MED ORDER — ONDANSETRON HCL 4 MG/2ML IJ SOLN
INTRAMUSCULAR | Status: AC
  Filled 2016-10-01: qty 2

## 2016-10-01 MED ORDER — FLEET ENEMA 7-19 GM/118ML RE ENEM: 1.0000 | ENEMA | Freq: Once | RECTAL | Status: DC | PRN

## 2016-10-01 MED ORDER — PHENYLEPHRINE 40 MCG/ML (10ML) SYRINGE FOR IV PUSH (FOR BLOOD PRESSURE SUPPORT)
PREFILLED_SYRINGE | INTRAVENOUS | Status: AC
  Filled 2016-10-01: qty 10

## 2016-10-01 MED ORDER — 0.9 % SODIUM CHLORIDE (POUR BTL) OPTIME
TOPICAL | Status: DC | PRN
  Administered 2016-10-01: 250 mL

## 2016-10-01 MED ORDER — PROPOFOL 10 MG/ML IV BOLUS
INTRAVENOUS | Status: DC | PRN
  Administered 2016-10-01: 10 mg via INTRAVENOUS
  Administered 2016-10-01: 20 mg via INTRAVENOUS
  Administered 2016-10-01: 30 mg via INTRAVENOUS

## 2016-10-01 MED ORDER — EPHEDRINE SULFATE 50 MG/ML IJ SOLN
INTRAMUSCULAR | Status: DC | PRN
  Administered 2016-10-01 (×2): 10 mg via INTRAVENOUS
  Administered 2016-10-01: 15 mg via INTRAVENOUS
  Administered 2016-10-01 (×2): 10 mg via INTRAVENOUS
  Administered 2016-10-01: 5 mg via INTRAVENOUS

## 2016-10-01 MED ORDER — CEFAZOLIN SODIUM-DEXTROSE 2-4 GM/100ML-% IV SOLN
INTRAVENOUS | Status: AC
  Filled 2016-10-01: qty 100

## 2016-10-01 MED ORDER — DEXAMETHASONE SODIUM PHOSPHATE 10 MG/ML IJ SOLN
INTRAMUSCULAR | Status: AC
  Filled 2016-10-01: qty 1

## 2016-10-01 MED ORDER — ONDANSETRON HCL 4 MG/2ML IJ SOLN
INTRAMUSCULAR | Status: DC | PRN
  Administered 2016-10-01: 4 mg via INTRAVENOUS

## 2016-10-01 MED ORDER — ONDANSETRON HCL 4 MG/2ML IJ SOLN: 4.0000 mg | Freq: Four times a day (QID) | INTRAMUSCULAR | Status: DC | PRN

## 2016-10-01 MED ORDER — HYDROMORPHONE HCL-NACL 0.5-0.9 MG/ML-% IV SOSY
0.5000 mg | PREFILLED_SYRINGE | INTRAVENOUS | Status: DC | PRN
  Administered 2016-10-01 (×2): 0.5 mg via INTRAVENOUS
  Filled 2016-10-01 (×2): qty 1

## 2016-10-01 MED ORDER — PHENYLEPHRINE HCL 10 MG/ML IJ SOLN
INTRAMUSCULAR | Status: DC | PRN
  Administered 2016-10-01 (×2): 80 ug via INTRAVENOUS

## 2016-10-01 MED ORDER — ACETAMINOPHEN 500 MG PO TABS
1000.0000 mg | ORAL_TABLET | Freq: Four times a day (QID) | ORAL | Status: AC
Start: 2016-10-01 — End: 2016-10-02
  Administered 2016-10-01 – 2016-10-02 (×4): 1000 mg via ORAL
  Filled 2016-10-01 (×4): qty 2

## 2016-10-01 MED ORDER — ACETAMINOPHEN 10 MG/ML IV SOLN
INTRAVENOUS | Status: AC
  Filled 2016-10-01: qty 100

## 2016-10-01 MED ORDER — METOCLOPRAMIDE HCL 5 MG PO TABS: 5.0000 mg | ORAL_TABLET | Freq: Three times a day (TID) | ORAL | Status: DC | PRN

## 2016-10-01 MED ORDER — MIDAZOLAM HCL 5 MG/5ML IJ SOLN
INTRAMUSCULAR | Status: DC | PRN
  Administered 2016-10-01 (×2): 1 mg via INTRAVENOUS

## 2016-10-01 MED ORDER — SODIUM CHLORIDE 0.9 % IV SOLN
INTRAVENOUS | Status: DC
  Administered 2016-10-01: 15:00:00 via INTRAVENOUS

## 2016-10-01 MED ORDER — FLUTICASONE PROPIONATE 50 MCG/ACT NA SUSP: 2.0000 | Freq: Every day | NASAL | Status: DC | PRN

## 2016-10-01 MED ORDER — METHOCARBAMOL 500 MG PO TABS
500.0000 mg | ORAL_TABLET | Freq: Four times a day (QID) | ORAL | Status: DC | PRN
  Administered 2016-10-01 – 2016-10-02 (×4): 500 mg via ORAL
  Filled 2016-10-01 (×4): qty 1

## 2016-10-01 MED ORDER — ACETAMINOPHEN 650 MG RE SUPP: 650.0000 mg | Freq: Four times a day (QID) | RECTAL | Status: DC | PRN

## 2016-10-01 MED ORDER — PREDNISONE 20 MG PO TABS
20.0000 mg | ORAL_TABLET | Freq: Every day | ORAL | Status: AC
  Administered 2016-10-01: 20 mg via ORAL
  Filled 2016-10-01: qty 1

## 2016-10-01 MED ORDER — PHENOL 1.4 % MT LIQD: 1.0000 | OROMUCOSAL | Status: DC | PRN

## 2016-10-01 MED ORDER — EPHEDRINE 5 MG/ML INJ
INTRAVENOUS | Status: AC
  Filled 2016-10-01: qty 20

## 2016-10-01 MED ORDER — TRANEXAMIC ACID 1000 MG/10ML IV SOLN
1000.0000 mg | Freq: Once | INTRAVENOUS | Status: AC
  Administered 2016-10-01: 16:00:00 1000 mg via INTRAVENOUS
  Filled 2016-10-01: qty 1100

## 2016-10-01 MED ORDER — DEXAMETHASONE SODIUM PHOSPHATE 10 MG/ML IJ SOLN
10.0000 mg | Freq: Once | INTRAMUSCULAR | Status: AC
  Administered 2016-10-01: 10 mg via INTRAVENOUS

## 2016-10-01 MED ORDER — LORATADINE 10 MG PO TABS
10.0000 mg | ORAL_TABLET | Freq: Every day | ORAL | Status: DC
  Administered 2016-10-02 – 2016-10-03 (×2): 10 mg via ORAL
  Filled 2016-10-01 (×2): qty 1

## 2016-10-01 MED ORDER — PANTOPRAZOLE SODIUM 40 MG PO TBEC
40.0000 mg | DELAYED_RELEASE_TABLET | Freq: Every day | ORAL | Status: DC
  Administered 2016-10-02 – 2016-10-03 (×2): 40 mg via ORAL
  Filled 2016-10-01 (×2): qty 1

## 2016-10-01 MED ORDER — SUCRALFATE 1 G PO TABS: 1.0000 g | ORAL_TABLET | Freq: Three times a day (TID) | ORAL | Status: DC | PRN

## 2016-10-01 MED ORDER — TRANEXAMIC ACID 1000 MG/10ML IV SOLN
1000.0000 mg | INTRAVENOUS | Status: AC
  Administered 2016-10-01: 1000 mg via INTRAVENOUS
  Filled 2016-10-01: qty 1100

## 2016-10-01 MED ORDER — BUPIVACAINE HCL (PF) 0.25 % IJ SOLN
INTRAMUSCULAR | Status: DC | PRN
  Administered 2016-10-01: 30 mL

## 2016-10-01 MED ORDER — DEXAMETHASONE SODIUM PHOSPHATE 10 MG/ML IJ SOLN
10.0000 mg | Freq: Once | INTRAMUSCULAR | Status: AC
  Administered 2016-10-02: 10 mg via INTRAVENOUS
  Filled 2016-10-01: qty 1

## 2016-10-01 MED ORDER — BUPIVACAINE IN DEXTROSE 0.75-8.25 % IT SOLN
INTRATHECAL | Status: DC | PRN
  Administered 2016-10-01: 1.8 mL via INTRATHECAL

## 2016-10-01 MED ORDER — NORTRIPTYLINE HCL 25 MG PO CAPS
25.0000 mg | ORAL_CAPSULE | Freq: Every day | ORAL | Status: DC
  Administered 2016-10-01 – 2016-10-02 (×2): 25 mg via ORAL
  Filled 2016-10-01 (×2): qty 1

## 2016-10-01 MED ORDER — AMLODIPINE BESYLATE 5 MG PO TABS
5.0000 mg | ORAL_TABLET | Freq: Every day | ORAL | Status: DC
  Administered 2016-10-02 – 2016-10-03 (×2): 5 mg via ORAL
  Filled 2016-10-01 (×2): qty 1

## 2016-10-01 MED ORDER — HYDROMORPHONE HCL 2 MG PO TABS
2.0000 mg | ORAL_TABLET | ORAL | Status: DC | PRN
  Administered 2016-10-01 – 2016-10-03 (×9): 4 mg via ORAL
  Filled 2016-10-01 (×9): qty 2

## 2016-10-01 MED ORDER — RIVAROXABAN 10 MG PO TABS
10.0000 mg | ORAL_TABLET | Freq: Every day | ORAL | Status: DC
  Administered 2016-10-02 – 2016-10-03 (×2): 10 mg via ORAL
  Filled 2016-10-01 (×2): qty 1

## 2016-10-01 MED ORDER — MIDAZOLAM HCL 2 MG/2ML IJ SOLN
INTRAMUSCULAR | Status: AC
  Filled 2016-10-01: qty 2

## 2016-10-01 MED ORDER — LACTATED RINGERS IV SOLN
INTRAVENOUS | Status: DC
Start: 2016-10-01 — End: 2016-10-01
  Administered 2016-10-01 (×2): via INTRAVENOUS

## 2016-10-01 MED ORDER — MENTHOL 3 MG MT LOZG: 1.0000 | LOZENGE | OROMUCOSAL | Status: DC | PRN

## 2016-10-01 MED ORDER — BUPIVACAINE HCL (PF) 0.25 % IJ SOLN
INTRAMUSCULAR | Status: AC
  Filled 2016-10-01: qty 30

## 2016-10-01 MED ORDER — PROPOFOL 500 MG/50ML IV EMUL
INTRAVENOUS | Status: DC | PRN
  Administered 2016-10-01: 50 ug/kg/min via INTRAVENOUS

## 2016-10-01 MED ORDER — PREDNISONE 5 MG PO TABS
5.0000 mg | ORAL_TABLET | Freq: Two times a day (BID) | ORAL | Status: DC
  Administered 2016-10-03: 5 mg via ORAL
  Filled 2016-10-01 (×2): qty 1

## 2016-10-01 MED ORDER — METOCLOPRAMIDE HCL 5 MG/ML IJ SOLN: 5.0000 mg | Freq: Three times a day (TID) | INTRAMUSCULAR | Status: DC | PRN

## 2016-10-01 MED ORDER — BISACODYL 10 MG RE SUPP: 10.0000 mg | Freq: Every day | RECTAL | Status: DC | PRN

## 2016-10-01 MED ORDER — ONDANSETRON HCL 4 MG PO TABS: 4.0000 mg | ORAL_TABLET | Freq: Four times a day (QID) | ORAL | Status: DC | PRN

## 2016-10-01 MED ORDER — PREDNISONE 5 MG PO TABS
5.0000 mg | ORAL_TABLET | Freq: Every day | ORAL | Status: DC
  Filled 2016-10-01: qty 1

## 2016-10-01 MED ORDER — HYDROMORPHONE HCL-NACL 0.5-0.9 MG/ML-% IV SOSY
0.2500 mg | PREFILLED_SYRINGE | INTRAVENOUS | Status: DC | PRN
  Administered 2016-10-01 (×4): 0.5 mg via INTRAVENOUS

## 2016-10-01 MED ORDER — ACETAMINOPHEN 325 MG PO TABS: 650.0000 mg | ORAL_TABLET | Freq: Four times a day (QID) | ORAL | Status: DC | PRN

## 2016-10-01 MED ORDER — ALPRAZOLAM 0.5 MG PO TABS
0.5000 mg | ORAL_TABLET | Freq: Every day | ORAL | Status: DC
  Administered 2016-10-01 – 2016-10-02 (×2): 1 mg via ORAL
  Filled 2016-10-01 (×2): qty 2

## 2016-10-01 MED ORDER — DIPHENHYDRAMINE HCL 12.5 MG/5ML PO ELIX: 12.5000 mg | ORAL_SOLUTION | ORAL | Status: DC | PRN

## 2016-10-01 MED ORDER — DIPHENHYDRAMINE HCL 50 MG/ML IJ SOLN
12.5000 mg | INTRAMUSCULAR | Status: AC | PRN
  Administered 2016-10-01 (×2): 12.5 mg via INTRAVENOUS

## 2016-10-01 MED ORDER — METHOCARBAMOL 1000 MG/10ML IJ SOLN
500.0000 mg | Freq: Four times a day (QID) | INTRAVENOUS | Status: DC | PRN
  Administered 2016-10-01: 500 mg via INTRAVENOUS
  Filled 2016-10-01: qty 550

## 2016-10-01 MED ORDER — ATORVASTATIN CALCIUM 20 MG PO TABS
20.0000 mg | ORAL_TABLET | Freq: Every day | ORAL | Status: DC
  Administered 2016-10-02 – 2016-10-03 (×2): 20 mg via ORAL
  Filled 2016-10-01 (×2): qty 1

## 2016-10-01 SURGICAL SUPPLY — 36 items
BAG DECANTER FOR FLEXI CONT (MISCELLANEOUS) ×1 IMPLANT
BAG SPEC THK2 15X12 ZIP CLS (MISCELLANEOUS) ×1
BAG ZIPLOCK 12X15 (MISCELLANEOUS) ×2 IMPLANT
BLADE SAG 18X100X1.27 (BLADE) ×3 IMPLANT
CAPT HIP TOTAL 2 ×2 IMPLANT
CLOSURE WOUND 1/2 X4 (GAUZE/BANDAGES/DRESSINGS) ×2
CLOTH BEACON ORANGE TIMEOUT ST (SAFETY) ×3 IMPLANT
COVER PERINEAL POST (MISCELLANEOUS) ×3 IMPLANT
COVER SURGICAL LIGHT HANDLE (MISCELLANEOUS) ×3 IMPLANT
DECANTER SPIKE VIAL GLASS SM (MISCELLANEOUS) ×1 IMPLANT
DRAPE STERI IOBAN 125X83 (DRAPES) ×3 IMPLANT
DRAPE U-SHAPE 47X51 STRL (DRAPES) ×6 IMPLANT
DRSG ADAPTIC 3X8 NADH LF (GAUZE/BANDAGES/DRESSINGS) ×3 IMPLANT
DRSG MEPILEX BORDER 4X4 (GAUZE/BANDAGES/DRESSINGS) ×3 IMPLANT
DRSG MEPILEX BORDER 4X8 (GAUZE/BANDAGES/DRESSINGS) ×3 IMPLANT
DURAPREP 26ML APPLICATOR (WOUND CARE) ×3 IMPLANT
ELECT REM PT RETURN 15FT ADLT (MISCELLANEOUS) ×3 IMPLANT
EVACUATOR 1/8 PVC DRAIN (DRAIN) ×3 IMPLANT
GLOVE BIO SURGEON STRL SZ7.5 (GLOVE) ×3 IMPLANT
GLOVE BIO SURGEON STRL SZ8 (GLOVE) ×6 IMPLANT
GLOVE BIOGEL PI IND STRL 8 (GLOVE) ×2 IMPLANT
GLOVE BIOGEL PI INDICATOR 8 (GLOVE) ×4
GOWN STRL REUS W/TWL LRG LVL3 (GOWN DISPOSABLE) ×3 IMPLANT
GOWN STRL REUS W/TWL XL LVL3 (GOWN DISPOSABLE) ×3 IMPLANT
PACK ANTERIOR HIP CUSTOM (KITS) ×3 IMPLANT
STRIP CLOSURE SKIN 1/2X4 (GAUZE/BANDAGES/DRESSINGS) ×3 IMPLANT
SUT ETHIBOND NAB CT1 #1 30IN (SUTURE) ×3 IMPLANT
SUT MNCRL AB 4-0 PS2 18 (SUTURE) ×3 IMPLANT
SUT STRATAFIX 0 PDS 27 VIOLET (SUTURE) ×3
SUT VIC AB 2-0 CT1 27 (SUTURE) ×6
SUT VIC AB 2-0 CT1 TAPERPNT 27 (SUTURE) ×2 IMPLANT
SUTURE STRATFX 0 PDS 27 VIOLET (SUTURE) ×1 IMPLANT
SYR 50ML LL SCALE MARK (SYRINGE) IMPLANT
TRAY FOLEY CATH SILVER 14FR (SET/KITS/TRAYS/PACK) ×2 IMPLANT
TRAY FOLEY W/METER SILVER 16FR (SET/KITS/TRAYS/PACK) ×1 IMPLANT
YANKAUER SUCT BULB TIP 10FT TU (MISCELLANEOUS) ×3 IMPLANT

## 2016-10-01 NOTE — Op Note (Signed)
OPERATIVE REPORT- TOTAL HIP ARTHROPLASTY   PREOPERATIVE DIAGNOSIS: Osteoarthritis of the Right hip.   POSTOPERATIVE DIAGNOSIS: Osteoarthritis of the Right  hip.   PROCEDURE: Right total hip arthroplasty, anterior approach.   SURGEON: Gaynelle Arabian, MD   ASSISTANT: Molli Barrows, PA-C  ANESTHESIA:  Spinal  ESTIMATED BLOOD LOSS:-200 ml    DRAINS: Hemovac x1.   COMPLICATIONS: None   CONDITION: PACU - hemodynamically stable.   BRIEF CLINICAL NOTE: Angie Olson is a 66 y.o. female who has advanced end-  stage arthritis of their Right  hip with progressively worsening pain and  dysfunction.The patient has failed nonoperative management and presents for  total hip arthroplasty.   PROCEDURE IN DETAIL: After successful administration of spinal  anesthetic, the traction boots for the [facility] bed were placed on both  feet and the patient was placed onto the [facility] bed, boots placed into the leg  holders. The Right hip was then isolated from the perineum with plastic  drapes and prepped and draped in the usual sterile fashion. ASIS and  greater trochanter were marked and a oblique incision was made, starting  at about 1 cm lateral and 2 cm distal to the ASIS and coursing towards  the anterior cortex of the femur. The skin was cut with a 10 blade  through subcutaneous tissue to the level of the fascia overlying the  tensor fascia lata muscle. The fascia was then incised in line with the  incision at the junction of the anterior third and posterior 2/3rd. The  muscle was teased off the fascia and then the interval between the TFL  and the rectus was developed. The Hohmann retractor was then placed at  the top of the femoral neck over the capsule. The vessels overlying the  capsule were cauterized and the fat on top of the capsule was removed.  A Hohmann retractor was then placed anterior underneath the rectus  femoris to give exposure to the entire anterior capsule. A T-shaped   capsulotomy was performed. The edges were tagged and the femoral head  was identified.       Osteophytes are removed off the superior acetabulum.  The femoral neck was then cut in situ with an oscillating saw. Traction  was then applied to the left lower extremity utilizing the [facility]  traction. The femoral head was then removed. Retractors were placed  around the acetabulum and then circumferential removal of the labrum was  performed. Osteophytes were also removed. Reaming starts at 45 mm to  medialize and  Increased in 2 mm increments to 47 mm. We reamed in  approximately 40 degrees of abduction, 20 degrees anteversion. A 48 mm  pinnacle acetabular shell was then impacted in anatomic position under  fluoroscopic guidance with excellent purchase. We did not need to place  any additional dome screws. A 28 mm neutral + 4 marathon liner was then  placed into the acetabular shell.       The femoral lift was then placed along the lateral aspect of the femur  just distal to the vastus ridge. The leg was  externally rotated and capsule  was stripped off the inferior aspect of the femoral neck down to the  level of the lesser trochanter, this was done with electrocautery. The femur was lifted after this was performed. The  leg was then placed in an extended and adducted position essentially delivering the femur. We also removed the capsule superiorly and the piriformis from the piriformis  fossa to gain excellent exposure of the  proximal femur. Rongeur was used to remove some cancellous bone to get  into the lateral portion of the proximal femur for placement of the  initial starter reamer. The starter broaches was placed  the starter broach  and was shown to go down the center of the canal. Broaching  with the  Corail system was then performed starting at size 8, coursing  Up to size 10. A size 10 had excellent torsional and rotational  and axial stability. The trial high offset neck was then  placed  with a 28 + 1.5 trial head. The hip was then reduced. We confirmed that  the stem was in the canal both on AP and lateral x-rays. It also has excellent sizing. The hip was reduced with outstanding stability through full extension and full external rotation.. AP pelvis was taken and the leg lengths were measured and found to be equal. Hip was then dislocated again and the femoral head and neck removed. The  femoral broach was removed. Size 10 Corail stem with a high offset  neck was then impacted into the femur following native anteversion. Has  excellent purchase in the canal. Excellent torsional and rotational and  axial stability. It is confirmed to be in the canal on AP and lateral  fluoroscopic views. The 28 + 15 ceramic head was placed and the hip  reduced with outstanding stability. Again AP pelvis was taken and it  confirmed that the leg lengths were equal. The wound was then copiously  irrigated with saline solution and the capsule reattached and repaired  with Ethibond suture. 30 ml of .25% Bupivicaine was  injected into the capsule and into the edge of the tensor fascia lata as well as subcutaneous tissue. The fascia overlying the tensor fascia lata was then closed with a running #1 V-Loc. Subcu was closed with interrupted 2-0 Vicryl and subcuticular running 4-0 Monocryl. Incision was cleaned  and dried. Steri-Strips and a bulky sterile dressing applied. Hemovac  drain was hooked to suction and then the patient was awakened and transported to  recovery in stable condition.        Please note that a surgical assistant was a medical necessity for this procedure to perform it in a safe and expeditious manner. Assistant was necessary to provide appropriate retraction of vital neurovascular structures and to prevent femoral fracture and allow for anatomic placement of the prosthesis.  Gaynelle Arabian, M.D.

## 2016-10-01 NOTE — Anesthesia Preprocedure Evaluation (Signed)
Anesthesia Evaluation  Patient identified by MRN, date of birth, ID band Patient awake    Reviewed: Allergy & Precautions, H&P , NPO status , Patient's Chart, lab work & pertinent test results  History of Anesthesia Complications (+) PONV and history of anesthetic complications  Airway Mallampati: II   Neck ROM: full    Dental   Pulmonary shortness of breath,    breath sounds clear to auscultation       Cardiovascular hypertension, + DOE   Rhythm:regular Rate:Normal     Neuro/Psych PSYCHIATRIC DISORDERS Anxiety CVA, No Residual Symptoms    GI/Hepatic hiatal hernia, GERD  ,  Endo/Other    Renal/GU      Musculoskeletal  (+) Arthritis ,   Abdominal   Peds  Hematology   Anesthesia Other Findings   Reproductive/Obstetrics                             Anesthesia Physical Anesthesia Plan  ASA: III  Anesthesia Plan: Spinal   Post-op Pain Management:    Induction: Intravenous  PONV Risk Score and Plan: 3 and Ondansetron, Dexamethasone, Propofol, Midazolam and Treatment may vary due to age or medical condition  Airway Management Planned: Simple Face Mask  Additional Equipment:   Intra-op Plan:   Post-operative Plan:   Informed Consent: I have reviewed the patients History and Physical, chart, labs and discussed the procedure including the risks, benefits and alternatives for the proposed anesthesia with the patient or authorized representative who has indicated his/her understanding and acceptance.     Plan Discussed with: CRNA, Anesthesiologist and Surgeon  Anesthesia Plan Comments:         Anesthesia Quick Evaluation

## 2016-10-01 NOTE — Interval H&P Note (Signed)
History and Physical Interval Note:  10/01/2016 8:17 AM  Angie Olson  has presented today for surgery, with the diagnosis of Osteoarthritis Right Hip  The various methods of treatment have been discussed with the patient and family. After consideration of risks, benefits and other options for treatment, the patient has consented to  Procedure(s): RIGHT TOTAL HIP ARTHROPLASTY ANTERIOR APPROACH (Right) as a surgical intervention .  The patient's history has been reviewed, patient examined, no change in status, stable for surgery.  I have reviewed the patient's chart and labs.  Questions were answered to the patient's satisfaction.     Gearlean Alf

## 2016-10-01 NOTE — Transfer of Care (Signed)
Immediate Anesthesia Transfer of Care Note  Patient: KELLSEY SANSONE  Procedure(s) Performed: Procedure(s): RIGHT TOTAL HIP ARTHROPLASTY ANTERIOR APPROACH (Right)  Patient Location: PACU  Anesthesia Type:Spinal  Level of Consciousness: awake, alert , oriented and patient cooperative  Airway & Oxygen Therapy: Patient Spontanous Breathing and Patient connected to face mask oxygen  Post-op Assessment: Report given to RN and Post -op Vital signs reviewed and stable  Post vital signs: Reviewed and stable  Last Vitals:  Vitals:   10/01/16 0741 10/01/16 0742  BP:  (!) 152/87  Pulse:  96  Resp: 18   Temp: 36.7 C     Last Pain:  Vitals:   10/01/16 0800  TempSrc:   PainSc: 8       Patients Stated Pain Goal: 5 (88/71/95 [id number])  Complications: No apparent anesthesia complications

## 2016-10-01 NOTE — Anesthesia Postprocedure Evaluation (Signed)
Anesthesia Post Note  Patient: Angie Olson  Procedure(s) Performed: Procedure(s) (LRB): RIGHT TOTAL HIP ARTHROPLASTY ANTERIOR APPROACH (Right)     Patient location during evaluation: PACU Anesthesia Type: Spinal Level of consciousness: oriented and awake and alert Pain management: pain level controlled Vital Signs Assessment: post-procedure vital signs reviewed and stable Respiratory status: spontaneous breathing, respiratory function stable and patient connected to nasal cannula oxygen Cardiovascular status: blood pressure returned to baseline and stable Postop Assessment: no headache and no backache Anesthetic complications: no    Last Vitals:  Vitals:   10/01/16 1215 10/01/16 1230  BP: 110/65 113/81  Pulse: 80 87  Resp: 12 11  Temp:      Last Pain:  Vitals:   10/01/16 1200  TempSrc:   PainSc: 7                  Wesson Stith S

## 2016-10-01 NOTE — Anesthesia Procedure Notes (Signed)
Spinal  Patient location during procedure: OR Start time: 10/01/2016 9:35 AM End time: 10/01/2016 9:40 AM Staffing Anesthesiologist: Marcie Bal, Esme Freund Performed: anesthesiologist  Preanesthetic Checklist Completed: patient identified, surgical consent, pre-op evaluation, timeout performed, IV checked, risks and benefits discussed and monitors and equipment checked Spinal Block Patient position: sitting Prep: DuraPrep Patient monitoring: cardiac monitor, continuous pulse ox and blood pressure Approach: midline Location: L2-3 Injection technique: single-shot Needle Needle type: Pencan  Needle gauge: 24 G Needle length: 9 cm Assessment Sensory level: T10 Additional Notes Functioning IV was confirmed and monitors were applied. Sterile prep and drape, including hand hygiene and sterile gloves were used. The patient was positioned and the spine was prepped. The skin was anesthetized with lidocaine.  Free flow of clear CSF was obtained prior to injecting local anesthetic into the CSF.  The spinal needle aspirated freely following injection.  The needle was carefully withdrawn.  The patient tolerated the procedure well.

## 2016-10-01 NOTE — Anesthesia Procedure Notes (Signed)
Procedure Name: MAC Date/Time: 10/01/2016 9:30 AM Performed by: Deliah Boston Pre-anesthesia Checklist: Patient identified, Emergency Drugs available, Suction available, Patient being monitored and Timeout performed Patient Re-evaluated:Patient Re-evaluated prior to induction Oxygen Delivery Method: Simple face mask Placement Confirmation: positive ETCO2 and breath sounds checked- equal and bilateral

## 2016-10-01 NOTE — Discharge Instructions (Addendum)
° °Dr. Frank Aluisio °Total Joint Specialist °[facility] °[address] °[place], [place] [address] °[phone] ° °ANTERIOR APPROACH TOTAL HIP REPLACEMENT POSTOPERATIVE DIRECTIONS ° ° °Hip Rehabilitation, Guidelines Following Surgery  °The results of a hip operation are greatly improved after range of motion and muscle strengthening exercises. Follow all safety measures which are given to protect your hip. If any of these exercises cause increased pain or swelling in your joint, decrease the amount until you are comfortable again. Then slowly increase the exercises. Call your caregiver if you have problems or questions.  ° °HOME CARE INSTRUCTIONS  °Remove items at home which could result in a fall. This includes throw rugs or furniture in walking pathways.  °· ICE to the affected hip every three hours for 30 minutes at a time and then as needed for pain and swelling.  Continue to use ice on the hip for pain and swelling from surgery. You may notice swelling that will progress down to the foot and ankle.  This is normal after surgery.  Elevate the leg when you are not up walking on it.   °· Continue to use the breathing machine which will help keep your temperature down.  It is common for your temperature to cycle up and down following surgery, especially at night when you are not up moving around and exerting yourself.  The breathing machine keeps your lungs expanded and your temperature down. ° ° °DIET °You may resume your previous home diet once your are discharged from the hospital. ° °DRESSING / WOUND CARE / SHOWERING °You may shower 3 days after surgery, but keep the wounds dry during showering.  You may use an occlusive plastic wrap (Press'n Seal for example), NO SOAKING/SUBMERGING IN THE BATHTUB.  If the bandage gets wet, change with a clean dry gauze.  If the incision gets wet, pat the wound dry with a clean towel. °You may start showering once you are discharged home but do not  submerge the incision under water. Just pat the incision dry and apply a dry gauze dressing on daily. °Change the surgical dressing daily and reapply a dry dressing each time. ° °ACTIVITY °Walk with your walker as instructed. °Use walker as long as suggested by your caregivers. °Avoid periods of inactivity such as sitting longer than an hour when not asleep. This helps prevent blood clots.  °You may resume a sexual relationship in one month or when given the OK by your doctor.  °You may return to work once you are cleared by your doctor.  °Do not drive a car for 6 weeks or until released by you surgeon.  °Do not drive while taking narcotics. ° °WEIGHT BEARING °Weight bearing as tolerated with assist device (walker, cane, etc) as directed, use it as long as suggested by your surgeon or therapist, typically at least 4-6 weeks. ° °POSTOPERATIVE CONSTIPATION PROTOCOL °Constipation - defined medically as fewer than three stools per week and severe constipation as less than one stool per week. ° °One of the most common issues patients have following surgery is constipation.  Even if you have a regular bowel pattern at home, your normal regimen is likely to be disrupted due to multiple reasons following surgery.  Combination of anesthesia, postoperative narcotics, change in appetite and fluid intake all can affect your bowels.  In order to avoid complications following surgery, here are some recommendations in order to help you during your recovery period. ° °Colace (docusate) - Pick up an over-the-counter   form of Colace or another stool softener and take twice a day as long as you are requiring postoperative pain medications.  Take with a full glass of water daily.  If you experience loose stools or diarrhea, hold the colace until you stool forms back up.  If your symptoms do not get better within 1 week or if they get worse, check with your doctor. ° °Dulcolax (bisacodyl) - Pick up over-the-counter and take as directed  by the product packaging as needed to assist with the movement of your bowels.  Take with a full glass of water.  Use this product as needed if not relieved by Colace only.  ° °MiraLax (polyethylene glycol) - Pick up over-the-counter to have on hand.  MiraLax is a solution that will increase the amount of water in your bowels to assist with bowel movements.  Take as directed and can mix with a glass of water, juice, soda, coffee, or tea.  Take if you go more than two days without a movement. °Do not use MiraLax more than once per day. Call your doctor if you are still constipated or irregular after using this medication for 7 days in a row. ° °If you continue to have problems with postoperative constipation, please contact the office for further assistance and recommendations.  If you experience "the worst abdominal pain ever" or develop nausea or vomiting, please contact the office immediatly for further recommendations for treatment. ° °ITCHING ° If you experience itching with your medications, try taking only a single pain pill, or even half a pain pill at a time.  You can also use Benadryl over the counter for itching or also to help with sleep.  ° °TED HOSE STOCKINGS °Wear the elastic stockings on both legs for three weeks following surgery during the day but you may remove then at night for sleeping. ° °MEDICATIONS °See your medication summary on the “After Visit Summary” that the nursing staff will review with you prior to discharge.  You may have some home medications which will be placed on hold until you complete the course of blood thinner medication.  It is important for you to complete the blood thinner medication as prescribed by your surgeon.  Continue your approved medications as instructed at time of discharge. ° °PRECAUTIONS °If you experience chest pain or shortness of breath - call 911 immediately for transfer to the hospital emergency department.  °If you develop a fever greater that 101 F,  purulent drainage from wound, increased redness or drainage from wound, foul odor from the wound/dressing, or calf pain - CONTACT YOUR SURGEON.   °                                                °FOLLOW-UP APPOINTMENTS °Make sure you keep all of your appointments after your operation with your surgeon and caregivers. You should call the office at the above phone number and make an appointment for approximately two weeks after the date of your surgery or on the date instructed by your surgeon outlined in the "After Visit Summary". ° °RANGE OF MOTION AND STRENGTHENING EXERCISES  °These exercises are designed to help you keep full movement of your hip joint. Follow your caregiver's or physical therapist's instructions. Perform all exercises about fifteen times, three times per day or as directed. Exercise both hips, even if you   have had only one joint replacement. These exercises can be done on a training (exercise) mat, on the floor, on a table or on a bed. Use whatever works the best and is most comfortable for you. Use music or television while you are exercising so that the exercises are a pleasant break in your day. This will make your life better with the exercises acting as a break in routine you can look forward to.  Lying on your back, slowly slide your foot toward your buttocks, raising your knee up off the floor. Then slowly slide your foot back down until your leg is straight again.  Lying on your back spread your legs as far apart as you can without causing discomfort.  Lying on your side, raise your upper leg and foot straight up from the floor as far as is comfortable. Slowly lower the leg and repeat.  Lying on your back, tighten up the muscle in the front of your thigh (quadriceps muscles). You can do this by keeping your leg straight and trying to raise your heel off the floor. This helps strengthen the largest muscle supporting your knee.  Lying on your back, tighten up the muscles of your  buttocks both with the legs straight and with the knee bent at a comfortable angle while keeping your heel on the floor.   IF YOU ARE TRANSFERRED TO A SKILLED REHAB FACILITY If the patient is transferred to a skilled rehab facility following release from the hospital, a list of the current medications will be sent to the facility for the patient to continue.  When discharged from the skilled rehab facility, please have the facility set up the patient's [place] prior to being released. Also, the skilled facility will be responsible for providing the patient with their medications at time of release from the facility to include their pain medication, the muscle relaxants, and their blood thinner medication. If the patient is still at the rehab facility at time of the two week follow up appointment, the skilled rehab facility will also need to assist the patient in arranging follow up appointment in our office and any transportation needs.  MAKE SURE YOU:  Understand these instructions.  Get help right away if you are not doing well or get worse.    Pick up stool softner and laxative for home use following surgery while on pain medications. Do not submerge incision under water. Please use good hand washing techniques while changing dressing each day. May shower starting three days after surgery. Please use a clean towel to pat the incision dry following showers. Continue to use ice for pain and swelling after surgery. Do not use any lotions or creams on the incision until instructed by your surgeon.  Take Xarelto for two and a half more weeks following discharge from the hospital, then discontinue Xarelto. Once the patient has completed the Xarelto, they may resume the 325 mg Aspirin.    Information on my medicine - XARELTO (Rivaroxaban)  This medication education was reviewed with me or my healthcare representative as part of my discharge preparation.   Why was Xarelto  prescribed for you? Xarelto was prescribed for you to reduce the risk of blood clots forming after orthopedic surgery. The medical term for these abnormal blood clots is venous thromboembolism (VTE).  What do you need to know about xarelto ? Take your Xarelto ONCE DAILY at the same time every day. You may take it either with or without food.  If you have difficulty swallowing the tablet whole, you may crush it and mix in applesauce just prior to taking your dose.  Take Xarelto exactly as prescribed by your doctor and DO NOT stop taking Xarelto without talking to the doctor who prescribed the medication.  Stopping without other VTE prevention medication to take the place of Xarelto may increase your risk of developing a clot.  After discharge, you should have regular check-up appointments with your healthcare provider that is prescribing your Xarelto.    What do you do if you miss a dose? If you miss a dose, take it as soon as you remember on the same day then continue your regularly scheduled once daily regimen the next day. Do not take two doses of Xarelto on the same day.   Important Safety Information A possible side effect of Xarelto is bleeding. You should call your healthcare provider right away if you experience any of the following: ? Bleeding from an injury or your nose that does not stop. ? Unusual colored urine (red or dark brown) or unusual colored stools (red or black). ? Unusual bruising for unknown reasons. ? A serious fall or if you hit your head (even if there is no bleeding).  Some medicines may interact with Xarelto and might increase your risk of bleeding while on Xarelto. To help avoid this, consult your healthcare provider or pharmacist prior to using any new prescription or non-prescription medications, including herbals, vitamins, non-steroidal anti-inflammatory drugs (NSAIDs) and supplements.  This website has more information on Xarelto:  [email or web address]

## 2016-10-02 LAB — BASIC METABOLIC PANEL
Anion gap: 9 (ref 5–15)
BUN: 18 mg/dL (ref 6–20)
CALCIUM: 8.8 mg/dL — AB (ref 8.9–10.3)
CO2: 25 mmol/L (ref 22–32)
CREATININE: 0.84 mg/dL (ref 0.44–1.00)
Chloride: 105 mmol/L (ref 101–111)
GFR calc Af Amer: >60 mL/min (ref >60)
GLUCOSE: 241 mg/dL — AB (ref 65–99)
Potassium: 4.3 mmol/L (ref 3.5–5.1)
Sodium: 139 mmol/L (ref 135–145)

## 2016-10-02 LAB — CBC
HEMATOCRIT: 31.5 % — AB (ref 36.0–46.0)
Hemoglobin: 10.4 g/dL — ABNORMAL LOW (ref 12.0–15.0)
MCH: 26.6 pg (ref 26.0–34.0)
MCHC: 33 g/dL (ref 30.0–36.0)
MCV: 80.6 fL (ref 78.0–100.0)
PLATELETS: 291 10*3/uL (ref 150–400)
RBC: 3.91 MIL/uL (ref 3.87–5.11)
RDW: 16 % — AB (ref 11.5–15.5)
WBC: 13.6 10*3/uL — AB (ref 4.0–10.5)

## 2016-10-02 NOTE — Progress Notes (Signed)
Discharge planning, spoke with patient and spouse at beside. Chose [facility] for HH services, contacted [facility] for referral. Has RW and 3-n-1. [phone] 

## 2016-10-02 NOTE — Progress Notes (Signed)
   Subjective: 1 Day Post-Op Procedure(s) (LRB): RIGHT TOTAL HIP ARTHROPLASTY ANTERIOR APPROACH (Right) Patient reports pain as mild.   Patient seen in rounds with Dr. Wynelle Link. Patient is well, and has had no acute complaints or problems other than soreness in the right leg. No issues overnight. No SOB or chest pain.    Objective: Vital signs in last 24 hours: Temp:  [95 F (35 C)-98.8 F (37.1 C)] 98.4 F (36.9 C) (07/19 0500) Pulse Rate:  [80-94] 94 (07/19 0500) Resp:  [10-23] 19 (07/19 0500) BP: (97-136)/(59-82) 124/76 (07/19 0500) SpO2:  [94 %-100 %] 99 % (07/19 0500)  Intake/Output from previous day:  Intake/Output Summary (Last 24 hours) at 10/02/16 0805 Last data filed at 10/02/16 0600  Gross per 24 hour  Intake          3981.41 ml  Output             07/03/78 ml  Net          1801.41 ml     Labs:  Recent Labs  10/02/16 0506  HGB 10.4*    Recent Labs  10/02/16 0506  WBC 13.6*  RBC 3.91  HCT 31.5*  PLT 291    Recent Labs  10/02/16 0506  NA 139  K 4.3  CL 105  CO2 25  BUN 18  CREATININE 0.84  GLUCOSE 241*  CALCIUM 8.8*    EXAM General - Patient is Alert and Oriented Extremity - Neurologically intact Intact pulses distally Dorsiflexion/Plantar flexion intact No cellulitis present Compartment soft Dressing - dressing C/D/I Motor Function - intact, moving foot and toes well on exam.  Hemovac pulled without difficulty.  Past Medical History:  Diagnosis Date  . Acid reflux   . Allergic rhinitis   . Anxiety    PT'S SON DIED July 03, 2010  . Arthritis    RA AND OA  . Borderline hypercholesterolemia    on medication for TIA  . Bronchitis    CHRONIC   . Colon polyp   . Environmental allergies   . GERD (gastroesophageal reflux disease)    on PPI and carafte prn  . H/O hiatal hernia   . History of kidney stones   . Hypertension   . IBS (irritable bowel syndrome)   . LBP (low back pain)   . OA (osteoarthritis)    Left Shoulder  . Pain    RIGHT KNEE - TORN MENISCUS AND ACL  . Pneumonia    X 3  - LAST TIME WAS 07/03/11  . PONV (postoperative nausea and vomiting) 09-2012   sore throat after ankle surgery 6 yrs ago and severe ponv 09-2012  . Pulmonary nodule seen on imaging study   . Shortness of breath    with activity,bronchitis  . Stroke ([place]) 03/2011   TIA--PT EXPERIENCED NUMBNESS RT HAND AND ARM , HEADACHE AND NAUSEA. NO RESIDUAL PROBLEMS    Assessment/Plan: 1 Day Post-Op Procedure(s) (LRB): RIGHT TOTAL HIP ARTHROPLASTY ANTERIOR APPROACH (Right) Principal Problem:   OA (osteoarthritis) of hip  Estimated body mass index is 31.62 kg/m as calculated from the following:   Height as of this encounter: 5\' 5"  (1.651 m).   Weight as of this encounter: 86.2 kg (190 lb). Advance diet Up with therapy D/C IV fluids when tolerating POs well  DVT Prophylaxis - Xarelto Weight Bearing As Tolerated   Plan for therapy today. Hopeful for DC tomorrow if she progresses well.  Ardeen Jourdain, PA-C Orthopaedic Surgery 10/02/2016, 8:05 AM

## 2016-10-02 NOTE — Evaluation (Signed)
Physical Therapy Evaluation Patient Details Name: Angie Olson MRN: [id number] DOB: 27-Aug-1950 Today's Date: 10/02/2016   History of Present Illness  R direct anterior THA, H/O L THA and bil TKA with revisions  Clinical Impression  The patient reports feeling exhausted. Assisted to ambulate  In room. Plans Dc tomorrow. Pt admitted with above diagnosis. Pt currently with functional limitations due to the deficits listed below (see PT Problem List).  Pt will benefit from skilled PT to increase their independence and safety with mobility to allow discharge to the venue listed below.       Follow Up Recommendations DC plan and follow up therapy as arranged by surgeon    Equipment Recommendations  None recommended by PT    Recommendations for Other Services       Precautions / Restrictions Precautions Precautions: Fall Restrictions Weight Bearing Restrictions: No      Mobility  Bed Mobility Overal bed mobility: Needs Assistance Bed Mobility: Supine to Sit     Supine to sit: HOB elevated;Min assist     General bed mobility comments: assist right leg  Transfers Overall transfer level: Needs assistance Equipment used: Rolling walker (2 wheeled) Transfers: Sit to/from Stand Sit to Stand: Min assist         General transfer comment: cues for hand and right leg postion  Ambulation/Gait Ambulation/Gait assistance: Min assist Ambulation Distance (Feet): 20 Feet Assistive device: Rolling walker (2 wheeled) Gait Pattern/deviations: Step-to pattern     General Gait Details: cues for sequence  Stairs            Wheelchair Mobility    Modified Rankin (Stroke Patients Only)       Balance                                             Pertinent Vitals/Pain Pain Assessment: 0-10 Pain Score: 6  Pain Location: right hip Pain Descriptors / Indicators: Sore Pain Intervention(s): RN gave pain meds during session;Monitored during  session;Premedicated before session    Home Living Family/patient expects to be discharged to:: Private residence Living Arrangements: Spouse/significant other Available Help at Discharge: Family;Available 24 hours/day Type of Home: House Home Access: Stairs to enter Entrance Stairs-Rails: Left Entrance Stairs-Number of Steps: 3 Home Layout: Multi-level;Able to live on main level with bedroom/bathroom Home Equipment: Gilford Rile - 2 wheels;Walker - 4 wheels;Bedside commode;Cane - single point;Shower seat      Prior Function Level of Independence: Independent with assistive device(s)         Comments: [place] and dresses herself. She does her own house keeping bur "leaves what doesn't need to be done" Husband able to assist PRN (He does have dx Parkinsons).     Hand Dominance   Dominant Hand: Right    Extremity/Trunk Assessment   Upper Extremity Assessment Upper Extremity Assessment: Overall WFL for tasks assessed    Lower Extremity Assessment Lower Extremity Assessment: RLE deficits/detail RLE Deficits / Details: advances the leg       Communication   Communication: No difficulties  Cognition Arousal/Alertness: Awake/alert Behavior During Therapy: WFL for tasks assessed/performed Overall Cognitive Status: Within Functional Limits for tasks assessed                                        General  Comments      Exercises     Assessment/Plan    PT Assessment Patient needs continued PT services  PT Problem List Decreased strength;Decreased range of motion;Decreased activity tolerance;Decreased mobility;Decreased knowledge of precautions;Decreased safety awareness;Decreased knowledge of use of DME;Pain       PT Treatment Interventions DME instruction;Gait training;Stair training;Functional mobility training;Therapeutic activities;Therapeutic exercise;Patient/family education    PT Goals (Current goals can be found in the Care Plan section)  Acute  Rehab PT Goals Patient Stated Goal: to get sleep and go home PT Goal Formulation: With patient Time For Goal Achievement: 10/05/16 Potential to Achieve Goals: Good    Frequency 7X/week   Barriers to discharge        Co-evaluation               AM-PAC PT "6 Clicks" Daily Activity  Outcome Measure Difficulty turning over in bed (including adjusting bedclothes, sheets and blankets)?: Total Difficulty moving from lying on back to sitting on the side of the bed? : Total Difficulty sitting down on and standing up from a chair with arms (e.g., wheelchair, bedside commode, etc,.)?: Total Help needed moving to and from a bed to chair (including a wheelchair)?: A Lot Help needed walking in hospital room?: A Lot Help needed climbing 3-5 steps with a railing? : Total 6 Click Score: 8    End of Session   Activity Tolerance: Patient tolerated treatment well Patient left: in chair;with call bell/phone within reach Nurse Communication: Mobility status PT Visit Diagnosis: Difficulty in walking, not elsewhere classified (R26.2);Pain Pain - Right/Left: Right Pain - part of body: Hip    Time: 6815-9470 PT Time Calculation (min) (ACUTE ONLY): 28 min   Charges:   PT Evaluation $PT Eval Low Complexity: 1 Procedure PT Treatments $Gait Training: 8-22 mins   PT G CodesTresa Endo PT [phone]   Claretha Cooper 10/02/2016, 10:03 AM

## 2016-10-02 NOTE — Progress Notes (Signed)
Physical Therapy Treatment Patient Details Name: Angie Olson MRN: [id number] DOB: April 24, 1950 Today's Date: 10/02/2016    History of Present Illness R direct anterior THA, H/O L THA and bil TKA with revisions    PT Comments    Progressing with mobility. Plans Dc tomorrow.   Follow Up Recommendations  Home health PT     Equipment Recommendations  None recommended by PT    Recommendations for Other Services       Precautions / Restrictions Precautions Precautions: Fall    Mobility  Bed Mobility   Bed Mobility: Sit to Supine       Sit to supine: Min guard   General bed mobility comments: patient self assited leg  Transfers Overall transfer level: Needs assistance Equipment used: Rolling walker (2 wheeled) Transfers: Sit to/from Stand Sit to Stand: Min guard         General transfer comment: cues for hand and right leg postion  Ambulation/Gait Ambulation/Gait assistance: Min assist Ambulation Distance (Feet): 80 Feet Assistive device: Rolling walker (2 wheeled) Gait Pattern/deviations: Step-to pattern;Step-through pattern;Antalgic     General Gait Details: cues for sequence   Stairs            Wheelchair Mobility    Modified Rankin (Stroke Patients Only)       Balance                                            Cognition Arousal/Alertness: Awake/alert                                            Exercises Total Joint Exercises Long Arc Quad: AROM;Right;10 reps;Seated    General Comments        Pertinent Vitals/Pain Pain Score: 4  Pain Location: right hip Pain Descriptors / Indicators: Sore Pain Intervention(s): Premedicated before session;Ice applied    Home Living                      Prior Function            PT Goals (current goals can now be found in the care plan section) Progress towards PT goals: Progressing toward goals    Frequency    7X/week      PT Plan  Current plan remains appropriate    Co-evaluation              AM-PAC PT "6 Clicks" Daily Activity  Outcome Measure  Difficulty turning over in bed (including adjusting bedclothes, sheets and blankets)?: A Little Difficulty moving from lying on back to sitting on the side of the bed? : A Little Difficulty sitting down on and standing up from a chair with arms (e.g., wheelchair, bedside commode, etc,.)?: Total Help needed moving to and from a bed to chair (including a wheelchair)?: Total Help needed walking in hospital room?: Total Help needed climbing 3-5 steps with a railing? : Total 6 Click Score: 10    End of Session   Activity Tolerance: Patient tolerated treatment well Patient left: in bed;with call bell/phone within reach Nurse Communication: Mobility status;Patient requests pain meds PT Visit Diagnosis: Difficulty in walking, not elsewhere classified (R26.2);Pain Pain - Right/Left: Right Pain - part of body: Hip     Time:  2707-8675 PT Time Calculation (min) (ACUTE ONLY): 17 min  Charges:  $Gait Training: 8-22 mins                    G CodesTresa Endo PT [phone]    Claretha Cooper 10/02/2016, 3:58 PM

## 2016-10-03 LAB — BASIC METABOLIC PANEL
Anion gap: 11 (ref 5–15)
BUN: 19 mg/dL (ref 6–20)
CHLORIDE: 105 mmol/L (ref 101–111)
CO2: 26 mmol/L (ref 22–32)
Calcium: 9.3 mg/dL (ref 8.9–10.3)
Creatinine, Ser: 0.74 mg/dL (ref 0.44–1.00)
GFR calc Af Amer: >60 mL/min (ref >60)
GFR calc non Af Amer: >60 mL/min (ref >60)
GLUCOSE: 180 mg/dL — AB (ref 65–99)
POTASSIUM: 4.3 mmol/L (ref 3.5–5.1)
Sodium: 142 mmol/L (ref 135–145)

## 2016-10-03 LAB — CBC
HCT: 33.1 % — ABNORMAL LOW (ref 36.0–46.0)
HEMOGLOBIN: 10.9 g/dL — AB (ref 12.0–15.0)
MCH: 26.3 pg (ref 26.0–34.0)
MCHC: 32.9 g/dL (ref 30.0–36.0)
MCV: 79.8 fL (ref 78.0–100.0)
Platelets: 324 10*3/uL (ref 150–400)
RBC: 4.15 MIL/uL (ref 3.87–5.11)
RDW: 16.2 % — ABNORMAL HIGH (ref 11.5–15.5)
WBC: 15.9 10*3/uL — ABNORMAL HIGH (ref 4.0–10.5)

## 2016-10-03 MED ORDER — TRAMADOL HCL 50 MG PO TABS: 50.0000 mg | ORAL_TABLET | Freq: Four times a day (QID) | ORAL | 1 refills | Status: DC | PRN

## 2016-10-03 MED ORDER — RIVAROXABAN 10 MG PO TABS: 10.0000 mg | ORAL_TABLET | Freq: Every day | ORAL | 0 refills | Status: DC

## 2016-10-03 MED ORDER — HYDROMORPHONE HCL 2 MG PO TABS: 2.0000 mg | ORAL_TABLET | ORAL | 0 refills | Status: DC | PRN

## 2016-10-03 MED ORDER — METHOCARBAMOL 500 MG PO TABS: 500.0000 mg | ORAL_TABLET | Freq: Four times a day (QID) | ORAL | 0 refills | Status: DC | PRN

## 2016-10-03 NOTE — Progress Notes (Signed)
Subjective: 2 Days Post-Op Procedure(s) (LRB): RIGHT TOTAL HIP ARTHROPLASTY ANTERIOR APPROACH (Right) Patient reports pain as mild.   Patient seen in rounds with Dr. Wynelle Link.  Family in room. Patient is well, but has had some minor complaints of pain in the hip, requiring pain medications Patient is ready to go home later today following therapy.  Objective: Vital signs in last 24 hours: Temp:  [97.8 F (36.6 C)-98.7 F (37.1 C)] 98.6 F (37 C) (07/20 0558) Pulse Rate:  [95-112] 108 (07/20 0558) Resp:  [17-20] 17 (07/20 0558) BP: (150-174)/(79-91) 150/88 (07/20 0558) SpO2:  [94 %-100 %] 99 % (07/20 0558)  Intake/Output from previous day:  Intake/Output Summary (Last 24 hours) at 10/03/16 0752 Last data filed at 10/03/16 0600  Gross per 24 hour  Intake             1275 ml  Output             1275 ml  Net                0 ml    Intake/Output this shift: No intake/output data recorded.  Labs:  Recent Labs  10/02/16 0506 10/03/16 0616  HGB 10.4* 10.9*    Recent Labs  10/02/16 0506 10/03/16 0616  WBC 13.6* 15.9*  RBC 3.91 4.15  HCT 31.5* 33.1*  PLT 291 324    Recent Labs  10/02/16 0506 10/03/16 0616  NA 139 142  K 4.3 4.3  CL 105 105  CO2 25 26  BUN 18 19  CREATININE 0.84 0.74  GLUCOSE 241* 180*  CALCIUM 8.8* 9.3   No results for input(s): LABPT, INR in the last 72 hours.  EXAM: General - Patient is Alert, Appropriate and Oriented Extremity - Neurovascular intact Sensation intact distally Intact pulses distally Dorsiflexion/Plantar flexion intact Incision - clean, dry, no drainage Motor Function - intact, moving foot and toes well on exam.   Assessment/Plan: 2 Days Post-Op Procedure(s) (LRB): RIGHT TOTAL HIP ARTHROPLASTY ANTERIOR APPROACH (Right) Procedure(s) (LRB): RIGHT TOTAL HIP ARTHROPLASTY ANTERIOR APPROACH (Right) Past Medical History:  Diagnosis Date  . Acid reflux   . Allergic rhinitis   . Anxiety    PT'S SON DIED 06-24-2010  .  Arthritis    RA AND OA  . Borderline hypercholesterolemia    on medication for TIA  . Bronchitis    CHRONIC   . Colon polyp   . Environmental allergies   . GERD (gastroesophageal reflux disease)    on PPI and carafte prn  . H/O hiatal hernia   . History of kidney stones   . Hypertension   . IBS (irritable bowel syndrome)   . LBP (low back pain)   . OA (osteoarthritis)    Left Shoulder  . Pain    RIGHT KNEE - TORN MENISCUS AND ACL  . Pneumonia    X 3  - LAST TIME WAS 2011-06-24  . PONV (postoperative nausea and vomiting) 09-2012   sore throat after ankle surgery 6 yrs ago and severe ponv 09-2012  . Pulmonary nodule seen on imaging study   . Shortness of breath    with activity,bronchitis  . Stroke ([place]) 03/2011   TIA--PT EXPERIENCED NUMBNESS RT HAND AND ARM , HEADACHE AND NAUSEA. NO RESIDUAL PROBLEMS   Principal Problem:   OA (osteoarthritis) of hip  Estimated body mass index is 31.62 kg/m as calculated from the following:   Height as of this encounter: 5\' 5"  (1.651 m).   Weight as  of this encounter: 86.2 kg (190 lb). Up with therapy Discharge home with home health Diet - Cardiac diet Follow up - in 2 weeks Activity - WBAT Disposition - Home Condition Upon Discharge - Stable D/C Meds - See DC Summary DVT Prophylaxis - Xarelto  [name], PA-C Orthopaedic Surgery 10/03/2016, 7:52 AM

## 2016-10-03 NOTE — Progress Notes (Signed)
Pt to d/c home with [facility] for [facility] PT. No DME needs. AVS reviewed and "My Chart" discussed with pt. Pt capable of verbalizing medications, dressing changes, signs and symptoms of infection, and follow-up appointments. Remains hemodynamically stable. No signs and symptoms of distress. Educated pt to return to ER in the case of SOB, dizziness, or chest pain.

## 2016-10-03 NOTE — Discharge Summary (Signed)
Physician Discharge Summary   Patient ID: Angie Olson MRN: [id number] DOB/AGE: February 18, 1951 66 y.o.  Admit date: 10/01/2016 Discharge date: 10/03/2016  Primary Diagnosis:  Osteoarthritis of the Right hip.   Admission Diagnoses:  Past Medical History:  Diagnosis Date  . Acid reflux   . Allergic rhinitis   . Anxiety    PT'S SON DIED Jun 10, 2010  . Arthritis    RA AND OA  . Borderline hypercholesterolemia    on medication for TIA  . Bronchitis    CHRONIC   . Colon polyp   . Environmental allergies   . GERD (gastroesophageal reflux disease)    on PPI and carafte prn  . H/O hiatal hernia   . History of kidney stones   . Hypertension   . IBS (irritable bowel syndrome)   . LBP (low back pain)   . OA (osteoarthritis)    Left Shoulder  . Pain    RIGHT KNEE - TORN MENISCUS AND ACL  . Pneumonia    X 3  - LAST TIME WAS 2011-06-11  . PONV (postoperative nausea and vomiting) 09-2012   sore throat after ankle surgery 6 yrs ago and severe ponv 09-2012  . Pulmonary nodule seen on imaging study   . Shortness of breath    with activity,bronchitis  . Stroke ([place]) 03/2011   TIA--PT EXPERIENCED NUMBNESS RT HAND AND ARM , HEADACHE AND NAUSEA. NO RESIDUAL PROBLEMS   Discharge Diagnoses:   Principal Problem:   OA (osteoarthritis) of hip  Estimated body mass index is 31.62 kg/m as calculated from the following:   Height as of this encounter: 5' 5"  (1.651 m).   Weight as of this encounter: 86.2 kg (190 lb).  Procedure(s) (LRB): RIGHT TOTAL HIP ARTHROPLASTY ANTERIOR APPROACH (Right)   Consults: None  HPI: Angie Olson is a 66 y.o. female who has advanced end-  stage arthritis of their Right  hip with progressively worsening pain and  dysfunction.The patient has failed nonoperative management and presents for  total hip arthroplasty.   Laboratory Data: Admission on 10/01/2016  Component Date Value Ref Range Status  . WBC 10/02/2016 13.6* 4.0 - 10.5 K/uL Final  . RBC 10/02/2016 3.91  3.87  - 5.11 MIL/uL Final  . Hemoglobin 10/02/2016 10.4* 12.0 - 15.0 g/dL Final  . HCT 10/02/2016 31.5* 36.0 - 46.0 % Final  . MCV 10/02/2016 80.6  78.0 - 100.0 fL Final  . MCH 10/02/2016 26.6  26.0 - 34.0 pg Final  . MCHC 10/02/2016 33.0  30.0 - 36.0 g/dL Final  . RDW 10/02/2016 16.0* 11.5 - 15.5 % Final  . Platelets 10/02/2016 291  150 - 400 K/uL Final  . Sodium 10/02/2016 139  135 - 145 mmol/L Final  . Potassium 10/02/2016 4.3  3.5 - 5.1 mmol/L Final  . Chloride 10/02/2016 105  101 - 111 mmol/L Final  . CO2 10/02/2016 25  22 - 32 mmol/L Final  . Glucose, Bld 10/02/2016 241* 65 - 99 mg/dL Final  . BUN 10/02/2016 18  6 - 20 mg/dL Final  . Creatinine, Ser 10/02/2016 0.84  0.44 - 1.00 mg/dL Final  . Calcium 10/02/2016 8.8* 8.9 - 10.3 mg/dL Final  . GFR calc non Af Amer 10/02/2016 >60  >60 mL/min Final  . GFR calc Af Amer 10/02/2016 >60  >60 mL/min Final   Comment: (NOTE) The eGFR has been calculated using the CKD EPI equation. This calculation has not been validated in all clinical situations. eGFR's persistently <60 mL/min signify  possible Chronic Kidney Disease.   . Anion gap 10/02/2016 9  5 - 15 Final  . WBC 10/03/2016 15.9* 4.0 - 10.5 K/uL Final  . RBC 10/03/2016 4.15  3.87 - 5.11 MIL/uL Final  . Hemoglobin 10/03/2016 10.9* 12.0 - 15.0 g/dL Final  . HCT 10/03/2016 33.1* 36.0 - 46.0 % Final  . MCV 10/03/2016 79.8  78.0 - 100.0 fL Final  . MCH 10/03/2016 26.3  26.0 - 34.0 pg Final  . MCHC 10/03/2016 32.9  30.0 - 36.0 g/dL Final  . RDW 10/03/2016 16.2* 11.5 - 15.5 % Final  . Platelets 10/03/2016 324  150 - 400 K/uL Final  . Sodium 10/03/2016 142  135 - 145 mmol/L Final  . Potassium 10/03/2016 4.3  3.5 - 5.1 mmol/L Final  . Chloride 10/03/2016 105  101 - 111 mmol/L Final  . CO2 10/03/2016 26  22 - 32 mmol/L Final  . Glucose, Bld 10/03/2016 180* 65 - 99 mg/dL Final  . BUN 10/03/2016 19  6 - 20 mg/dL Final  . Creatinine, Ser 10/03/2016 0.74  0.44 - 1.00 mg/dL Final  . Calcium  10/03/2016 9.3  8.9 - 10.3 mg/dL Final  . GFR calc non Af Amer 10/03/2016 >60  >60 mL/min Final  . GFR calc Af Amer 10/03/2016 >60  >60 mL/min Final   Comment: (NOTE) The eGFR has been calculated using the CKD EPI equation. This calculation has not been validated in all clinical situations. eGFR's persistently <60 mL/min signify possible Chronic Kidney Disease.   Georgiann Hahn gap 10/03/2016 11  5 - 15 Final  Hospital Outpatient Visit on 09/26/2016  Component Date Value Ref Range Status  . aPTT 09/26/2016 26  24 - 36 seconds Final  . WBC 09/26/2016 8.5  4.0 - 10.5 K/uL Final  . RBC 09/26/2016 4.74  3.87 - 5.11 MIL/uL Final  . Hemoglobin 09/26/2016 12.3  12.0 - 15.0 g/dL Final  . HCT 09/26/2016 38.1  36.0 - 46.0 % Final  . MCV 09/26/2016 80.4  78.0 - 100.0 fL Final  . MCH 09/26/2016 25.9* 26.0 - 34.0 pg Final  . MCHC 09/26/2016 32.3  30.0 - 36.0 g/dL Final  . RDW 09/26/2016 16.0* 11.5 - 15.5 % Final  . Platelets 09/26/2016 349  150 - 400 K/uL Final  . Sodium 09/26/2016 144  135 - 145 mmol/L Final  . Potassium 09/26/2016 4.7  3.5 - 5.1 mmol/L Final  . Chloride 09/26/2016 108  101 - 111 mmol/L Final  . CO2 09/26/2016 30  22 - 32 mmol/L Final  . Glucose, Bld 09/26/2016 167* 65 - 99 mg/dL Final  . BUN 09/26/2016 22* 6 - 20 mg/dL Final  . Creatinine, Ser 09/26/2016 0.94  0.44 - 1.00 mg/dL Final  . Calcium 09/26/2016 9.3  8.9 - 10.3 mg/dL Final  . Total Protein 09/26/2016 6.9  6.5 - 8.1 g/dL Final  . Albumin 09/26/2016 4.0  3.5 - 5.0 g/dL Final  . AST 09/26/2016 23  15 - 41 U/L Final  . ALT 09/26/2016 22  14 - 54 U/L Final  . Alkaline Phosphatase 09/26/2016 66  38 - 126 U/L Final  . Total Bilirubin 09/26/2016 0.4  0.3 - 1.2 mg/dL Final  . GFR calc non Af Amer 09/26/2016 >60  >60 mL/min Final  . GFR calc Af Amer 09/26/2016 >60  >60 mL/min Final   Comment: (NOTE) The eGFR has been calculated using the CKD EPI equation. This calculation has not been validated in all clinical  situations. eGFR's  persistently <60 mL/min signify possible Chronic Kidney Disease.   . Anion gap 09/26/2016 6  5 - 15 Final  . Prothrombin Time 09/26/2016 13.2  11.4 - 15.2 seconds Final  . INR 09/26/2016 1.00   Final  . ABO/RH(D) 09/26/2016 A NEG   Final  . Antibody Screen 09/26/2016 NEG   Final  . Sample Expiration 09/26/2016 10/04/2016   Final  . Extend sample reason 09/26/2016 NO TRANSFUSIONS OR PREGNANCY IN THE PAST 3 MONTHS   Final  . MRSA, PCR 09/26/2016 NEGATIVE  NEGATIVE Final  . Staphylococcus aureus 09/26/2016 NEGATIVE  NEGATIVE Final   Comment:        The Xpert SA Assay (FDA approved for NASAL specimens in patients over 2 years of age), is one component of a comprehensive surveillance program.  Test performance has been validated by [facility] for patients greater than or equal to 73 year old. It is not intended to diagnose infection nor to guide or monitor treatment.      X-Rays:Dg Pelvis Portable  Result Date: 10/01/2016 CLINICAL DATA:  Right hip replacement . EXAM: PORTABLE PELVIS 1-2 VIEWS COMPARISON:  04/25/2015. FINDINGS: Total right hip replacement. Hardware intact. Anatomic alignment noted on AP view. Prior left hip replacement . IMPRESSION: Total right hip replacement with anatomic alignment . Electronically Signed   By: Marcello Moores  Register   On: 10/01/2016 11:58   Dg C-arm 1-60 Min-no Report  Result Date: 10/01/2016 Fluoroscopy was utilized by the requesting physician.  No radiographic interpretation.    EKG: Orders placed or performed during the hospital encounter of 04/07/16  . EKG 12-Lead  . EKG 12-Lead  . EKG 12-Lead  . EKG 12-Lead     Hospital Course: Patient was admitted to [facility] and taken to the OR and underwent the above state procedure without complications.  Patient tolerated the procedure well and was later transferred to the recovery room and then to the orthopaedic floor for postoperative care.  They were given PO and IV  analgesics for pain control following their surgery.  They were given 24 hours of postoperative antibiotics of  Anti-infectives    Start     Dose/Rate Route Frequency Ordered Stop   10/01/16 1600  ceFAZolin (ANCEF) IVPB 2g/100 mL premix     2 g 200 mL/hr over 30 Minutes Intravenous Every 6 hours 10/01/16 1432 10/01/16 2213   10/01/16 0746  ceFAZolin (ANCEF) 2-4 GM/100ML-% IVPB    Comments:  Harvell, Gwendolyn  : cabinet override      10/01/16 0746 10/01/16 0942   10/01/16 0741  ceFAZolin (ANCEF) IVPB 2g/100 mL premix     2 g 200 mL/hr over 30 Minutes Intravenous On call to O.R. 10/01/16 5945 10/01/16 0942     and started on DVT prophylaxis in the form of Xarelto.   PT and OT were ordered for total hip protocol.  The patient was allowed to be WBAT with therapy. Discharge planning was consulted to help with postop disposition and equipment needs.  Patient had a decent night on the evening of surgery.  They started to get up OOB with therapy on day one.  Hemovac drain was pulled without difficulty.  Continued to work with therapy into day two.  Dressing was changed on day two and the incision was healing well.  Patient was seen in rounds by Dr. Wynelle Link and was ready to go home.  Discharge home with home health Diet - Cardiac diet Follow up - in 2 weeks Activity -  WBAT Disposition - Home Condition Upon Discharge - Stable D/C Meds - See DC Summary DVT Prophylaxis - Xarelto   Discharge Instructions    Call MD / Call 911    Complete by:  As directed    If you experience chest pain or shortness of breath, CALL 911 and be transported to the hospital emergency room.  If you develope a fever above 101 F, pus (white drainage) or increased drainage or redness at the wound, or calf pain, call your surgeon's office.   Change dressing    Complete by:  As directed    You may change your dressing dressing daily with sterile 4 x 4 inch gauze dressing and paper tape.  Do not submerge the incision under  water.   Constipation Prevention    Complete by:  As directed    Drink plenty of fluids.  Prune juice may be helpful.  You may use a stool softener, such as Colace (over the counter) 100 mg twice a day.  Use MiraLax (over the counter) for constipation as needed.   Diet - low sodium heart healthy    Complete by:  As directed    Discharge instructions    Complete by:  As directed    Take Xarelto for two and a half more weeks, then discontinue Xarelto. Once the patient has completed the Xarelto, they may resume the 325 mg Aspirin.   Pick up stool softner and laxative for home use following surgery while on pain medications. Do not submerge incision under water. Please use good hand washing techniques while changing dressing each day. May shower starting three days after surgery. Please use a clean towel to pat the incision dry following showers. Continue to use ice for pain and swelling after surgery. Do not use any lotions or creams on the incision until instructed by your surgeon.  Wear both TED hose on both legs during the day every day for three weeks, but may remove the TED hose at night at home.  Postoperative Constipation Protocol  Constipation - defined medically as fewer than three stools per week and severe constipation as less than one stool per week.  One of the most common issues patients have following surgery is constipation.  Even if you have a regular bowel pattern at home, your normal regimen is likely to be disrupted due to multiple reasons following surgery.  Combination of anesthesia, postoperative narcotics, change in appetite and fluid intake all can affect your bowels.  In order to avoid complications following surgery, here are some recommendations in order to help you during your recovery period.  Colace (docusate) - Pick up an over-the-counter form of Colace or another stool softener and take twice a day as long as you are requiring postoperative pain medications.   Take with a full glass of water daily.  If you experience loose stools or diarrhea, hold the colace until you stool forms back up.  If your symptoms do not get better within 1 week or if they get worse, check with your doctor.  Dulcolax (bisacodyl) - Pick up over-the-counter and take as directed by the product packaging as needed to assist with the movement of your bowels.  Take with a full glass of water.  Use this product as needed if not relieved by Colace only.   MiraLax (polyethylene glycol) - Pick up over-the-counter to have on hand.  MiraLax is a solution that will increase the amount of water in your bowels to assist with bowel  movements.  Take as directed and can mix with a glass of water, juice, soda, coffee, or tea.  Take if you go more than two days without a movement. Do not use MiraLax more than once per day. Call your doctor if you are still constipated or irregular after using this medication for 7 days in a row.  If you continue to have problems with postoperative constipation, please contact the office for further assistance and recommendations.  If you experience "the worst abdominal pain ever" or develop nausea or vomiting, please contact the office immediatly for further recommendations for treatment.   Do not sit on low chairs, stoools or toilet seats, as it may be difficult to get up from low surfaces    Complete by:  As directed    Driving restrictions    Complete by:  As directed    No driving until released by the physician.   Increase activity slowly as tolerated    Complete by:  As directed    Lifting restrictions    Complete by:  As directed    No lifting until released by the physician.   Patient may shower    Complete by:  As directed    You may shower without a dressing once there is no drainage.  Do not wash over the wound.  If drainage remains, do not shower until drainage stops.   TED hose    Complete by:  As directed    Use stockings (TED hose) for 3 weeks  on both leg(s).  You may remove them at night for sleeping.   Weight bearing as tolerated    Complete by:  As directed    Laterality:  right   Extremity:  Lower     Allergies as of 10/03/2016      Reactions   Methotrexate Derivatives Other (See Comments)   Causes recurrent infections   Pseudoephedrine Other (See Comments)   Makes jittery    Reglan [metoclopramide] Other (See Comments)   insomnia   Vicodin [hydrocodone-acetaminophen] Other (See Comments)   "Makes me feel weird"   Oxycodone Itching, Rash   Patient tolerated hydromorphone without reaction.      Medication List    STOP taking these medications   aspirin 325 MG EC tablet   ibuprofen 200 MG tablet Commonly known as:  ADVIL,MOTRIN   leflunomide 20 MG tablet Commonly known as:  ARAVA     TAKE these medications   ALPRAZolam 1 MG tablet Commonly known as:  XANAX Take 0.5-1 mg by mouth at bedtime.   amLODipine 5 MG tablet Commonly known as:  NORVASC Take 5 mg by mouth daily.   atorvastatin 20 MG tablet Commonly known as:  LIPITOR Take 20 mg by mouth daily.   cetirizine 10 MG tablet Commonly known as:  ZYRTEC Take 10 mg by mouth daily.   esomeprazole 20 MG capsule Commonly known as:  NEXIUM Take 20 mg by mouth daily before breakfast.   fluticasone 50 MCG/ACT nasal spray Commonly known as:  FLONASE Place 2 sprays into both nostrils daily as needed for allergies.   HYDROmorphone 2 MG tablet Commonly known as:  DILAUDID Take 1-2 tablets (2-4 mg total) by mouth every 4 (four) hours as needed for moderate pain or severe pain.   lisinopril 40 MG tablet Commonly known as:  PRINIVIL,ZESTRIL Take 40 mg by mouth daily.   methocarbamol 500 MG tablet Commonly known as:  ROBAXIN Take 1 tablet (500 mg total) by mouth every 6 (  six) hours as needed for muscle spasms.   naloxegol oxalate 12.5 MG Tabs tablet Commonly known as:  MOVANTIK Take 1 tablet (12.5 mg total) by mouth daily.   nortriptyline 25 MG  capsule Commonly known as:  PAMELOR Take 25 mg by mouth at bedtime.   predniSONE 5 MG tablet Commonly known as:  DELTASONE Take 5 mg by mouth daily. Continuous for rheumatoid arthritis   rivaroxaban 10 MG Tabs tablet Commonly known as:  XARELTO Take 1 tablet (10 mg total) by mouth daily with breakfast. Take Xarelto for two and a half more weeks following discharge from the hospital, then discontinue Xarelto. Once the patient has completed the Xarelto, they may resume the 325 mg Aspirin.   sucralfate 1 g tablet Commonly known as:  CARAFATE Take 1 g by mouth 3 (three) times daily as needed (indigestion).   traMADol 50 MG tablet Commonly known as:  ULTRAM Take 1-2 tablets (50-100 mg total) by mouth every 6 (six) hours as needed (mild pain).   TYLENOL 8 HOUR ARTHRITIS PAIN 650 MG CR tablet Generic drug:  acetaminophen Take 1,300 mg by mouth every 8 (eight) hours as needed for pain.      Follow-up Information    [facility] Follow [facility].   Why:  physical therapy Contact information: [address] [place] [address] [phone]        Gaynelle Arabian, MD. Schedule an appointment as soon as possible for a visit on 10/14/2016.   Specialty:  Orthopedic Surgery Contact information: [address] [place] [address] [phone]           Signed: [name], PA-C Orthopaedic Surgery 10/03/2016, 8:07 AM

## 2016-10-03 NOTE — Progress Notes (Signed)
Physical Therapy Treatment Patient Details Name: Angie Olson MRN: [id number] DOB: January 03, 1951 Today's Date: 10/03/2016    History of Present Illness R direct anterior THA, H/O L THA and bil TKA with revisions    PT Comments    Ready for DC.   Follow Up Recommendations  Home health PT     Equipment Recommendations  None recommended by PT    Recommendations for Other Services       Precautions / Restrictions Precautions Precautions: Fall    Mobility  Bed Mobility               General bed mobility comments: patient self assited leg  Transfers   Equipment used: Rolling walker (2 wheeled) Transfers: Sit to/from Stand Sit to Stand: Min guard         General transfer comment: cues for hand and right leg postion  Ambulation/Gait Ambulation/Gait assistance: Min guard Ambulation Distance (Feet): 90 Feet Assistive device: Rolling walker (2 wheeled) Gait Pattern/deviations: Step-to pattern;Step-through pattern;Antalgic     General Gait Details: cues for sequence   Stairs     Stair Management: One rail Left;Step to pattern;Forwards Number of Stairs: 2 General stair comments: cues for sequence  Wheelchair Mobility    Modified Rankin (Stroke Patients Only)       Balance                                            Cognition Arousal/Alertness: Awake/alert                                            Exercises Total Joint Exercises Ankle Circles/Pumps: AROM;Left;10 reps Short Arc Quad: AROM;Left;10 reps Heel Slides: AROM;Left;10 reps Hip ABduction/ADduction: AROM;Left;10 reps    General Comments        Pertinent Vitals/Pain Pain Score: 4  Pain Location: right hip Pain Descriptors / Indicators: Sore Pain Intervention(s): Premedicated before session    Home Living                      Prior Function            PT Goals (current goals can now be found in the care plan section) Progress  towards PT goals: Progressing toward goals    Frequency    7X/week      PT Plan Current plan remains appropriate    Co-evaluation              AM-PAC PT "6 Clicks" Daily Activity  Outcome Measure  Difficulty turning over in bed (including adjusting bedclothes, sheets and blankets)?: A Little Difficulty moving from lying on back to sitting on the side of the bed? : A Little Difficulty sitting down on and standing up from a chair with arms (e.g., wheelchair, bedside commode, etc,.)?: Total Help needed moving to and from a bed to chair (including a wheelchair)?: Total Help needed walking in hospital room?: Total Help needed climbing 3-5 steps with a railing? : Total 6 Click Score: 10    End of Session   Activity Tolerance: Patient tolerated treatment well Patient left: in bed;with call bell/phone within reach;with family/visitor present Nurse Communication: Mobility status PT Visit Diagnosis: Difficulty in walking, not elsewhere classified (R26.2);Pain Pain - Right/Left: Right Pain -  part of body: Hip     Time: 1292-9090 PT Time Calculation (min) (ACUTE ONLY): 20 min  Charges:  $Gait Training: 8-22 mins                    G CodesTresa Endo PT [phone]   Claretha Cooper 10/03/2016, 12:48 PM

## 2016-10-10 ENCOUNTER — Encounter (HOSPITAL_COMMUNITY): Payer: Self-pay | Admitting: Orthopedic Surgery

## 2017-05-23 DIAGNOSIS — Z96651 Presence of right artificial knee joint: Secondary | ICD-10-CM | POA: Insufficient documentation

## 2017-07-16 ENCOUNTER — Other Ambulatory Visit: Payer: Self-pay | Admitting: Internal Medicine

## 2017-07-16 DIAGNOSIS — I6522 Occlusion and stenosis of left carotid artery: Secondary | ICD-10-CM

## 2017-07-16 DIAGNOSIS — G459 Transient cerebral ischemic attack, unspecified: Secondary | ICD-10-CM

## 2017-07-20 ENCOUNTER — Ambulatory Visit
Admission: RE | Admit: 2017-07-20 | Discharge: 2017-07-20 | Disposition: A | Discharge status: Home / Self Care | Payer: Medicare Other | Source: Ambulatory Visit | Attending: Internal Medicine | Admitting: Internal Medicine

## 2017-07-20 DIAGNOSIS — G459 Transient cerebral ischemic attack, unspecified: Secondary | ICD-10-CM

## 2017-07-20 DIAGNOSIS — I6522 Occlusion and stenosis of left carotid artery: Secondary | ICD-10-CM

## 2018-06-11 IMAGING — DX DG PORTABLE PELVIS
1 series · 1 of 1 positions shown · non-contrast
Comparison: 04/25/2015.

CLINICAL DATA: Right hip replacement .

EXAM:
PORTABLE PELVIS 1-2 VIEWS

[pelvis ap]
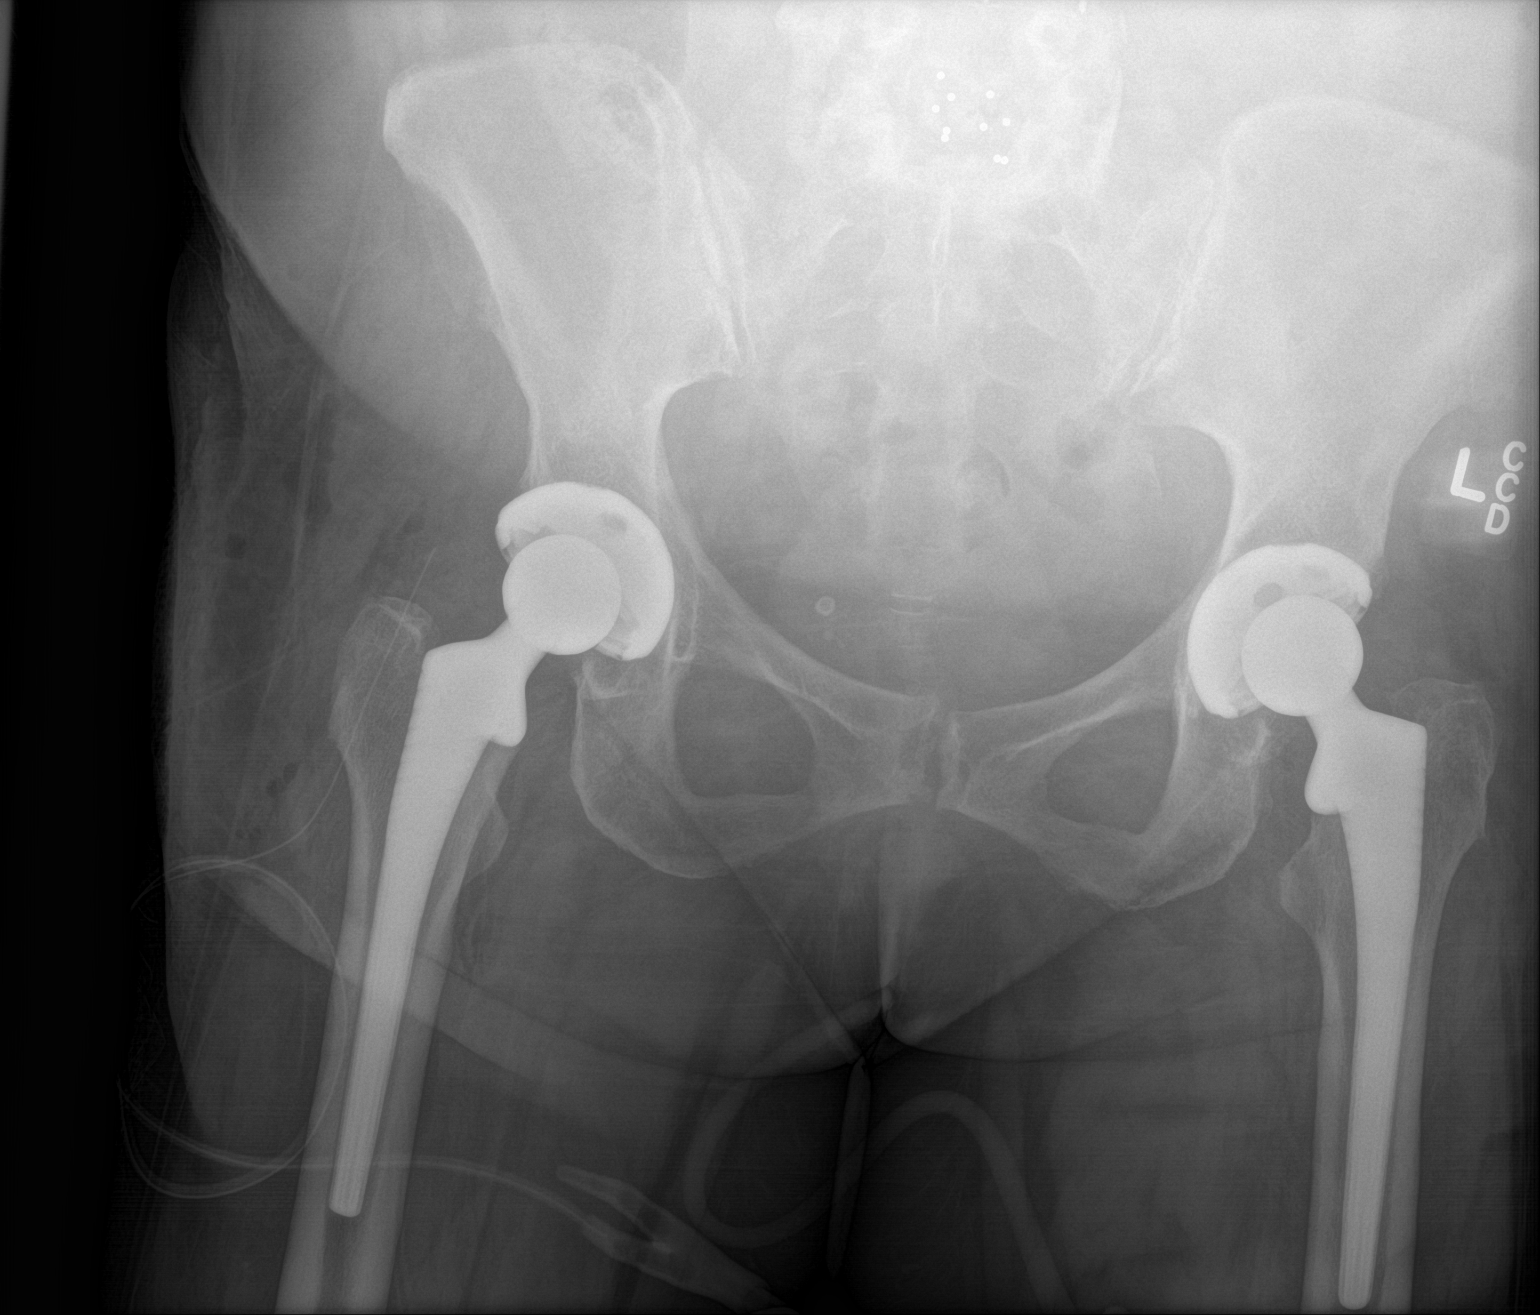

[1 of 1 positions shown; findings below may reference images not displayed]

FINDINGS: Total right hip replacement. Hardware intact. Anatomic alignment
noted on AP view. Prior left hip replacement .
IMPRESSION: Total right hip replacement with anatomic alignment .

## 2018-11-11 ENCOUNTER — Other Ambulatory Visit: Payer: Self-pay | Admitting: Neurosurgery

## 2018-11-11 DIAGNOSIS — M5137 Other intervertebral disc degeneration, lumbosacral region: Secondary | ICD-10-CM

## 2018-12-01 ENCOUNTER — Ambulatory Visit
Admission: RE | Admit: 2018-12-01 | Discharge: 2018-12-01 | Disposition: A | Discharge status: Home / Self Care | Payer: Medicare Other | Source: Ambulatory Visit | Attending: Neurosurgery | Admitting: Neurosurgery

## 2018-12-01 ENCOUNTER — Other Ambulatory Visit: Payer: Self-pay

## 2018-12-01 DIAGNOSIS — M5137 Other intervertebral disc degeneration, lumbosacral region: Secondary | ICD-10-CM

## 2018-12-01 MED ORDER — GADOBENATE DIMEGLUMINE 529 MG/ML IV SOLN
15.0000 mL | Freq: Once | INTRAVENOUS | Status: AC | PRN
  Administered 2018-12-01: 15 mL via INTRAVENOUS

## 2018-12-10 ENCOUNTER — Other Ambulatory Visit: Payer: Medicare Other

## 2018-12-13 ENCOUNTER — Other Ambulatory Visit: Payer: Self-pay | Admitting: Neurosurgery

## 2019-01-07 ENCOUNTER — Other Ambulatory Visit: Payer: Self-pay

## 2019-01-07 ENCOUNTER — Encounter (HOSPITAL_COMMUNITY): Payer: Self-pay | Admitting: *Deleted

## 2019-01-07 NOTE — Progress Notes (Signed)
Spoke with pt for pre-op call. Pt denies cardiac history and Diabetes. Pt is treated for HTN. Pt's PCP is Dr. Velna Hatchet. Pt was admitted to a hospital close to her home for pain control, just discharged today. Pt will go to the [facility] on Monday to get her Covid test done.

## 2019-01-10 ENCOUNTER — Inpatient Hospital Stay (HOSPITAL_COMMUNITY): Payer: Medicare Other | Admitting: Certified Registered"

## 2019-01-10 ENCOUNTER — Other Ambulatory Visit (HOSPITAL_COMMUNITY)
Admission: RE | Admit: 2019-01-10 | Discharge: 2019-01-10 | Disposition: A | Discharge status: Home / Self Care | Payer: Medicare Other | Source: Ambulatory Visit | Attending: Neurosurgery | Admitting: Neurosurgery

## 2019-01-10 ENCOUNTER — Other Ambulatory Visit: Payer: Self-pay

## 2019-01-10 ENCOUNTER — Inpatient Hospital Stay (HOSPITAL_COMMUNITY)
Admission: RE | Admit: 2019-01-10 | Discharge: 2019-01-11 | DRG: 460 | Disposition: A | Discharge status: Home / Self Care | Payer: Medicare Other | Attending: Neurosurgery | Admitting: Neurosurgery

## 2019-01-10 ENCOUNTER — Encounter (HOSPITAL_COMMUNITY)
Admission: RE | Disposition: A | Discharge status: Home / Self Care | Payer: Self-pay | Source: Home / Self Care | Attending: Neurosurgery

## 2019-01-10 ENCOUNTER — Inpatient Hospital Stay (HOSPITAL_COMMUNITY): Payer: Medicare Other

## 2019-01-10 ENCOUNTER — Encounter (HOSPITAL_COMMUNITY): Payer: Self-pay | Admitting: Certified Registered"

## 2019-01-10 DIAGNOSIS — M79604 Pain in right leg: Secondary | ICD-10-CM | POA: Diagnosis present

## 2019-01-10 DIAGNOSIS — F419 Anxiety disorder, unspecified: Secondary | ICD-10-CM | POA: Diagnosis present

## 2019-01-10 DIAGNOSIS — M069 Rheumatoid arthritis, unspecified: Secondary | ICD-10-CM | POA: Diagnosis not present

## 2019-01-10 DIAGNOSIS — Z8673 Personal history of transient ischemic attack (TIA), and cerebral infarction without residual deficits: Secondary | ICD-10-CM

## 2019-01-10 DIAGNOSIS — E7800 Pure hypercholesterolemia, unspecified: Secondary | ICD-10-CM | POA: Diagnosis not present

## 2019-01-10 DIAGNOSIS — K219 Gastro-esophageal reflux disease without esophagitis: Secondary | ICD-10-CM | POA: Diagnosis present

## 2019-01-10 DIAGNOSIS — Z885 Allergy status to narcotic agent status: Secondary | ICD-10-CM

## 2019-01-10 DIAGNOSIS — Z825 Family history of asthma and other chronic lower respiratory diseases: Secondary | ICD-10-CM

## 2019-01-10 DIAGNOSIS — Z7952 Long term (current) use of systemic steroids: Secondary | ICD-10-CM | POA: Diagnosis not present

## 2019-01-10 DIAGNOSIS — M532X6 Spinal instabilities, lumbar region: Secondary | ICD-10-CM | POA: Diagnosis not present

## 2019-01-10 DIAGNOSIS — Z85820 Personal history of malignant melanoma of skin: Secondary | ICD-10-CM | POA: Diagnosis not present

## 2019-01-10 DIAGNOSIS — Z96651 Presence of right artificial knee joint: Secondary | ICD-10-CM | POA: Diagnosis present

## 2019-01-10 DIAGNOSIS — Z801 Family history of malignant neoplasm of trachea, bronchus and lung: Secondary | ICD-10-CM | POA: Diagnosis not present

## 2019-01-10 DIAGNOSIS — Z981 Arthrodesis status: Secondary | ICD-10-CM | POA: Diagnosis not present

## 2019-01-10 DIAGNOSIS — Z79899 Other long term (current) drug therapy: Secondary | ICD-10-CM

## 2019-01-10 DIAGNOSIS — K589 Irritable bowel syndrome without diarrhea: Secondary | ICD-10-CM | POA: Diagnosis not present

## 2019-01-10 DIAGNOSIS — Z888 Allergy status to other drugs, medicaments and biological substances status: Secondary | ICD-10-CM | POA: Diagnosis not present

## 2019-01-10 DIAGNOSIS — Z419 Encounter for procedure for purposes other than remedying health state, unspecified: Secondary | ICD-10-CM

## 2019-01-10 DIAGNOSIS — M1990 Unspecified osteoarthritis, unspecified site: Secondary | ICD-10-CM | POA: Diagnosis not present

## 2019-01-10 DIAGNOSIS — M5126 Other intervertebral disc displacement, lumbar region: Secondary | ICD-10-CM | POA: Diagnosis not present

## 2019-01-10 DIAGNOSIS — Z96643 Presence of artificial hip joint, bilateral: Secondary | ICD-10-CM | POA: Diagnosis not present

## 2019-01-10 DIAGNOSIS — Z8249 Family history of ischemic heart disease and other diseases of the circulatory system: Secondary | ICD-10-CM

## 2019-01-10 DIAGNOSIS — Z20828 Contact with and (suspected) exposure to other viral communicable diseases: Secondary | ICD-10-CM | POA: Diagnosis not present

## 2019-01-10 DIAGNOSIS — G894 Chronic pain syndrome: Secondary | ICD-10-CM

## 2019-01-10 HISTORY — DX: Malignant (primary) neoplasm, unspecified: C80.1

## 2019-01-10 LAB — BASIC METABOLIC PANEL
Anion gap: 11 (ref 5–15)
BUN: 21 mg/dL (ref 8–23)
CO2: 23 mmol/L (ref 22–32)
Calcium: 8.8 mg/dL — ABNORMAL LOW (ref 8.9–10.3)
Chloride: 106 mmol/L (ref 98–111)
Creatinine, Ser: 0.7 mg/dL (ref 0.44–1.00)
GFR calc Af Amer: >60 mL/min (ref >60)
GFR calc non Af Amer: >60 mL/min (ref >60)
Glucose, Bld: 142 mg/dL — ABNORMAL HIGH (ref 70–99)
Potassium: 3.6 mmol/L (ref 3.5–5.1)
Sodium: 140 mmol/L (ref 135–145)

## 2019-01-10 LAB — TYPE AND SCREEN
ABO/RH(D): A NEG
Antibody Screen: NEGATIVE

## 2019-01-10 LAB — SARS CORONAVIRUS 2 BY RT PCR (HOSPITAL ORDER, PERFORMED IN ~~LOC~~ HOSPITAL LAB): SARS Coronavirus 2: NEGATIVE

## 2019-01-10 LAB — CBC
HCT: 37.1 % (ref 36.0–46.0)
Hemoglobin: 11.9 g/dL — ABNORMAL LOW (ref 12.0–15.0)
MCH: 28.1 pg (ref 26.0–34.0)
MCHC: 32.1 g/dL (ref 30.0–36.0)
MCV: 87.5 fL (ref 80.0–100.0)
Platelets: 292 10*3/uL (ref 150–400)
RBC: 4.24 MIL/uL (ref 3.87–5.11)
RDW: 13.7 % (ref 11.5–15.5)
WBC: 7.9 10*3/uL (ref 4.0–10.5)
nRBC: 0 % (ref 0.0–0.2)

## 2019-01-10 SURGERY — POSTERIOR LUMBAR FUSION 1 LEVEL
Anesthesia: General | Site: Spine Lumbar

## 2019-01-10 MED ORDER — LACTATED RINGERS IV SOLN
INTRAVENOUS | Status: DC
  Administered 2019-01-10 (×2): via INTRAVENOUS

## 2019-01-10 MED ORDER — SODIUM CHLORIDE 0.9 % IV SOLN: 250.0000 mL | INTRAVENOUS | Status: DC

## 2019-01-10 MED ORDER — HEPARIN SODIUM (PORCINE) 1000 UNIT/ML IJ SOLN
INTRAMUSCULAR | Status: AC
  Filled 2019-01-10: qty 1

## 2019-01-10 MED ORDER — BUPIVACAINE HCL (PF) 0.25 % IJ SOLN
INTRAMUSCULAR | Status: AC
  Filled 2019-01-10: qty 30

## 2019-01-10 MED ORDER — FENTANYL CITRATE (PF) 100 MCG/2ML IJ SOLN
25.0000 ug | INTRAMUSCULAR | Status: DC | PRN
  Administered 2019-01-10 (×3): 25 ug via INTRAVENOUS

## 2019-01-10 MED ORDER — VITAMIN D (ERGOCALCIFEROL) 1.25 MG (50000 UNIT) PO CAPS: 50000.0000 [IU] | ORAL_CAPSULE | ORAL | Status: DC

## 2019-01-10 MED ORDER — THROMBIN 20000 UNITS EX SOLR
CUTANEOUS | Status: DC | PRN
  Administered 2019-01-10: 16:00:00 via TOPICAL

## 2019-01-10 MED ORDER — HYDROXYCHLOROQUINE SULFATE 200 MG PO TABS
200.0000 mg | ORAL_TABLET | Freq: Two times a day (BID) | ORAL | Status: DC
  Administered 2019-01-10 – 2019-01-11 (×2): 200 mg via ORAL
  Filled 2019-01-10 (×3): qty 1

## 2019-01-10 MED ORDER — ALUM & MAG HYDROXIDE-SIMETH 200-200-20 MG/5ML PO SUSP
30.0000 mL | Freq: Four times a day (QID) | ORAL | Status: DC | PRN
  Administered 2019-01-11: 30 mL via ORAL
  Filled 2019-01-10: qty 30

## 2019-01-10 MED ORDER — CEFAZOLIN SODIUM-DEXTROSE 2-4 GM/100ML-% IV SOLN
2.0000 g | Freq: Three times a day (TID) | INTRAVENOUS | Status: AC
  Administered 2019-01-10 – 2019-01-11 (×2): 2 g via INTRAVENOUS
  Filled 2019-01-10 (×2): qty 100

## 2019-01-10 MED ORDER — LIDOCAINE-EPINEPHRINE 1 %-1:100000 IJ SOLN
INTRAMUSCULAR | Status: DC | PRN
  Administered 2019-01-10: 10 mL

## 2019-01-10 MED ORDER — LISINOPRIL 20 MG PO TABS
40.0000 mg | ORAL_TABLET | Freq: Every day | ORAL | Status: DC
  Administered 2019-01-11: 40 mg via ORAL
  Filled 2019-01-10: qty 2

## 2019-01-10 MED ORDER — SUCRALFATE 1 G PO TABS
1.0000 g | ORAL_TABLET | Freq: Three times a day (TID) | ORAL | Status: DC | PRN
  Filled 2019-01-10: qty 1

## 2019-01-10 MED ORDER — PANTOPRAZOLE SODIUM 40 MG IV SOLR: 40.0000 mg | Freq: Every day | INTRAVENOUS | Status: DC

## 2019-01-10 MED ORDER — ONDANSETRON HCL 4 MG PO TABS: 4.0000 mg | ORAL_TABLET | Freq: Four times a day (QID) | ORAL | Status: DC | PRN

## 2019-01-10 MED ORDER — ARTHREX ANGEL - ACD-A SOLUTION (CHARTING ONLY) OPTIME
TOPICAL | Status: DC | PRN
  Administered 2019-01-10: 16:00:00 10 mL via TOPICAL

## 2019-01-10 MED ORDER — ACETAMINOPHEN ER 650 MG PO TBCR: 1300.0000 mg | EXTENDED_RELEASE_TABLET | Freq: Three times a day (TID) | ORAL | Status: DC | PRN

## 2019-01-10 MED ORDER — CYCLOBENZAPRINE HCL 10 MG PO TABS: 10.0000 mg | ORAL_TABLET | Freq: Three times a day (TID) | ORAL | Status: DC | PRN

## 2019-01-10 MED ORDER — PROPOFOL 10 MG/ML IV BOLUS
INTRAVENOUS | Status: DC | PRN
  Administered 2019-01-10: 150 mg via INTRAVENOUS
  Administered 2019-01-10: 20 mg via INTRAVENOUS

## 2019-01-10 MED ORDER — SUFENTANIL CITRATE 50 MCG/ML IV SOLN
INTRAVENOUS | Status: AC
  Filled 2019-01-10: qty 1

## 2019-01-10 MED ORDER — HYDRALAZINE HCL 20 MG/ML IJ SOLN
10.0000 mg | Freq: Once | INTRAMUSCULAR | Status: AC
  Administered 2019-01-10: 13:00:00 10 mg via INTRAVENOUS

## 2019-01-10 MED ORDER — CHLORHEXIDINE GLUCONATE CLOTH 2 % EX PADS: 6.0000 | MEDICATED_PAD | Freq: Once | CUTANEOUS | Status: DC

## 2019-01-10 MED ORDER — MIDAZOLAM HCL 5 MG/5ML IJ SOLN
INTRAMUSCULAR | Status: DC | PRN
  Administered 2019-01-10: 2 mg via INTRAVENOUS

## 2019-01-10 MED ORDER — SODIUM CHLORIDE 0.9% FLUSH: 3.0000 mL | INTRAVENOUS | Status: DC | PRN

## 2019-01-10 MED ORDER — ONDANSETRON HCL 4 MG/2ML IJ SOLN: 4.0000 mg | Freq: Once | INTRAMUSCULAR | Status: DC | PRN

## 2019-01-10 MED ORDER — ATORVASTATIN CALCIUM 10 MG PO TABS
20.0000 mg | ORAL_TABLET | Freq: Every day | ORAL | Status: DC
  Administered 2019-01-11: 20 mg via ORAL
  Filled 2019-01-10: qty 2

## 2019-01-10 MED ORDER — CEFAZOLIN SODIUM-DEXTROSE 2-4 GM/100ML-% IV SOLN
INTRAVENOUS | Status: AC
  Filled 2019-01-10: qty 100

## 2019-01-10 MED ORDER — SODIUM CHLORIDE 0.9 % IV SOLN
INTRAVENOUS | Status: DC | PRN
  Administered 2019-01-10: 16:00:00

## 2019-01-10 MED ORDER — NALOXONE HCL 0.4 MG/ML IJ SOLN
0.4000 mg | INTRAMUSCULAR | Status: DC | PRN
  Administered 2019-01-10: 40 ug via INTRAVENOUS
  Administered 2019-01-10: 20 ug via INTRAVENOUS
  Administered 2019-01-10: 80 ug via INTRAVENOUS

## 2019-01-10 MED ORDER — ACETAMINOPHEN 10 MG/ML IV SOLN
INTRAVENOUS | Status: AC
  Filled 2019-01-10: qty 100

## 2019-01-10 MED ORDER — DEXAMETHASONE SODIUM PHOSPHATE 10 MG/ML IJ SOLN
INTRAMUSCULAR | Status: AC
  Filled 2019-01-10: qty 1

## 2019-01-10 MED ORDER — THROMBIN 20000 UNITS EX SOLR
CUTANEOUS | Status: AC
  Filled 2019-01-10: qty 20000

## 2019-01-10 MED ORDER — PREDNISONE 5 MG PO TABS: 5.0000 mg | ORAL_TABLET | Freq: Every day | ORAL | Status: DC

## 2019-01-10 MED ORDER — HYDROMORPHONE HCL 1 MG/ML IJ SOLN
1.0000 mg | INTRAMUSCULAR | Status: DC | PRN
  Administered 2019-01-10: 1 mg via INTRAVENOUS
  Filled 2019-01-10: qty 1

## 2019-01-10 MED ORDER — CEFAZOLIN SODIUM-DEXTROSE 2-4 GM/100ML-% IV SOLN
2.0000 g | INTRAVENOUS | Status: AC
  Administered 2019-01-10: 2 g via INTRAVENOUS

## 2019-01-10 MED ORDER — 0.9 % SODIUM CHLORIDE (POUR BTL) OPTIME
TOPICAL | Status: DC | PRN
  Administered 2019-01-10: 16:00:00 1000 mL

## 2019-01-10 MED ORDER — HYDROMORPHONE HCL 2 MG PO TABS
2.0000 mg | ORAL_TABLET | ORAL | Status: DC | PRN
  Administered 2019-01-10 – 2019-01-11 (×6): 2 mg via ORAL
  Filled 2019-01-10 (×6): qty 1

## 2019-01-10 MED ORDER — SODIUM CHLORIDE 0.9% FLUSH
3.0000 mL | Freq: Two times a day (BID) | INTRAVENOUS | Status: DC
  Administered 2019-01-11: 3 mL via INTRAVENOUS

## 2019-01-10 MED ORDER — NALOXONE HCL 0.4 MG/ML IJ SOLN
INTRAMUSCULAR | Status: AC
  Filled 2019-01-10: qty 1

## 2019-01-10 MED ORDER — ONDANSETRON HCL 4 MG/2ML IJ SOLN
INTRAMUSCULAR | Status: AC
  Filled 2019-01-10: qty 2

## 2019-01-10 MED ORDER — PROPOFOL 10 MG/ML IV BOLUS
INTRAVENOUS | Status: AC
  Filled 2019-01-10: qty 20

## 2019-01-10 MED ORDER — DEXAMETHASONE SODIUM PHOSPHATE 10 MG/ML IJ SOLN
INTRAMUSCULAR | Status: DC | PRN
  Administered 2019-01-10: 10 mg via INTRAVENOUS

## 2019-01-10 MED ORDER — ACETAMINOPHEN 10 MG/ML IV SOLN
1000.0000 mg | Freq: Four times a day (QID) | INTRAVENOUS | Status: DC
  Administered 2019-01-10: 1000 mg via INTRAVENOUS

## 2019-01-10 MED ORDER — ACETAMINOPHEN 650 MG RE SUPP: 650.0000 mg | RECTAL | Status: DC | PRN

## 2019-01-10 MED ORDER — NORTRIPTYLINE HCL 25 MG PO CAPS
25.0000 mg | ORAL_CAPSULE | Freq: Every day | ORAL | Status: DC
  Administered 2019-01-10: 25 mg via ORAL
  Filled 2019-01-10 (×2): qty 1

## 2019-01-10 MED ORDER — PANTOPRAZOLE SODIUM 40 MG PO TBEC
40.0000 mg | DELAYED_RELEASE_TABLET | Freq: Every day | ORAL | Status: DC
  Administered 2019-01-11: 40 mg via ORAL
  Filled 2019-01-10: qty 1

## 2019-01-10 MED ORDER — MIDAZOLAM HCL 2 MG/2ML IJ SOLN
INTRAMUSCULAR | Status: AC
  Filled 2019-01-10: qty 2

## 2019-01-10 MED ORDER — SUFENTANIL CITRATE 50 MCG/ML IV SOLN
INTRAVENOUS | Status: DC | PRN
  Administered 2019-01-10 (×2): 10 ug via INTRAVENOUS
  Administered 2019-01-10: 20 ug via INTRAVENOUS
  Administered 2019-01-10 (×6): 10 ug via INTRAVENOUS

## 2019-01-10 MED ORDER — ROCURONIUM BROMIDE 10 MG/ML (PF) SYRINGE
PREFILLED_SYRINGE | INTRAVENOUS | Status: AC
  Filled 2019-01-10: qty 10

## 2019-01-10 MED ORDER — HYDRALAZINE HCL 20 MG/ML IJ SOLN
INTRAMUSCULAR | Status: AC
  Administered 2019-01-10: 10 mg via INTRAVENOUS
  Filled 2019-01-10: qty 1

## 2019-01-10 MED ORDER — PHENYLEPHRINE 40 MCG/ML (10ML) SYRINGE FOR IV PUSH (FOR BLOOD PRESSURE SUPPORT)
PREFILLED_SYRINGE | INTRAVENOUS | Status: AC
  Filled 2019-01-10: qty 10

## 2019-01-10 MED ORDER — HEPARIN SODIUM (PORCINE) 1000 UNIT/ML IJ SOLN
INTRAMUSCULAR | Status: DC | PRN
  Administered 2019-01-10: 10000 [IU] via INTRAVENOUS

## 2019-01-10 MED ORDER — PROPOFOL 500 MG/50ML IV EMUL
INTRAVENOUS | Status: DC | PRN
  Administered 2019-01-10: 60 ug/kg/min via INTRAVENOUS

## 2019-01-10 MED ORDER — LORATADINE 10 MG PO TABS
10.0000 mg | ORAL_TABLET | Freq: Every day | ORAL | Status: DC
  Administered 2019-01-11: 10 mg via ORAL
  Filled 2019-01-10: qty 1

## 2019-01-10 MED ORDER — PREDNISONE 5 MG PO TABS
5.0000 mg | ORAL_TABLET | Freq: Every day | ORAL | Status: DC
  Filled 2019-01-10: qty 1

## 2019-01-10 MED ORDER — HYDROCODONE-ACETAMINOPHEN 10-325 MG PO TABS: 1.0000 | ORAL_TABLET | ORAL | Status: DC | PRN

## 2019-01-10 MED ORDER — ROCURONIUM BROMIDE 10 MG/ML (PF) SYRINGE
PREFILLED_SYRINGE | INTRAVENOUS | Status: DC | PRN
  Administered 2019-01-10: 10 mg via INTRAVENOUS
  Administered 2019-01-10: 70 mg via INTRAVENOUS
  Administered 2019-01-10 (×2): 10 mg via INTRAVENOUS

## 2019-01-10 MED ORDER — PHENOL 1.4 % MT LIQD: 1.0000 | OROMUCOSAL | Status: DC | PRN

## 2019-01-10 MED ORDER — LEFLUNOMIDE 20 MG PO TABS
20.0000 mg | ORAL_TABLET | Freq: Every day | ORAL | Status: DC
  Administered 2019-01-11: 20 mg via ORAL
  Filled 2019-01-10: qty 1

## 2019-01-10 MED ORDER — PREDNISONE 20 MG PO TABS
20.0000 mg | ORAL_TABLET | Freq: Three times a day (TID) | ORAL | Status: DC
  Administered 2019-01-11: 20 mg via ORAL
  Filled 2019-01-10 (×2): qty 1

## 2019-01-10 MED ORDER — BUPIVACAINE LIPOSOME 1.3 % IJ SUSP
20.0000 mL | Freq: Once | INTRAMUSCULAR | Status: AC
  Administered 2019-01-10: 17:00:00 20 mL
  Filled 2019-01-10: qty 20

## 2019-01-10 MED ORDER — ACETAMINOPHEN 325 MG PO TABS: 650.0000 mg | ORAL_TABLET | ORAL | Status: DC | PRN

## 2019-01-10 MED ORDER — MENTHOL 3 MG MT LOZG: 1.0000 | LOZENGE | OROMUCOSAL | Status: DC | PRN

## 2019-01-10 MED ORDER — ONDANSETRON HCL 4 MG/2ML IJ SOLN
INTRAMUSCULAR | Status: DC | PRN
  Administered 2019-01-10: 4 mg via INTRAVENOUS

## 2019-01-10 MED ORDER — AMLODIPINE BESYLATE 5 MG PO TABS
5.0000 mg | ORAL_TABLET | Freq: Every day | ORAL | Status: DC
  Administered 2019-01-11: 5 mg via ORAL
  Filled 2019-01-10: qty 1

## 2019-01-10 MED ORDER — LIDOCAINE 2% (20 MG/ML) 5 ML SYRINGE
INTRAMUSCULAR | Status: AC
  Filled 2019-01-10: qty 5

## 2019-01-10 MED ORDER — FENTANYL CITRATE (PF) 100 MCG/2ML IJ SOLN
INTRAMUSCULAR | Status: AC
  Filled 2019-01-10: qty 2

## 2019-01-10 MED ORDER — METHOCARBAMOL 750 MG PO TABS
750.0000 mg | ORAL_TABLET | ORAL | Status: DC
  Administered 2019-01-10 – 2019-01-11 (×6): 750 mg via ORAL
  Filled 2019-01-10 (×6): qty 1

## 2019-01-10 MED ORDER — GABAPENTIN 300 MG PO CAPS
300.0000 mg | ORAL_CAPSULE | Freq: Three times a day (TID) | ORAL | Status: DC
  Administered 2019-01-10 – 2019-01-11 (×3): 300 mg via ORAL
  Filled 2019-01-10 (×3): qty 1

## 2019-01-10 MED ORDER — ONDANSETRON HCL 4 MG/2ML IJ SOLN
4.0000 mg | Freq: Four times a day (QID) | INTRAMUSCULAR | Status: DC | PRN
  Administered 2019-01-10: 4 mg via INTRAVENOUS
  Filled 2019-01-10: qty 2

## 2019-01-10 MED ORDER — ALPRAZOLAM 0.5 MG PO TABS
1.0000 mg | ORAL_TABLET | Freq: Two times a day (BID) | ORAL | Status: DC | PRN
  Administered 2019-01-10: 1 mg via ORAL
  Filled 2019-01-10: qty 2

## 2019-01-10 MED ORDER — LIDOCAINE 2% (20 MG/ML) 5 ML SYRINGE
INTRAMUSCULAR | Status: DC | PRN
  Administered 2019-01-10: 80 mg via INTRAVENOUS

## 2019-01-10 MED ORDER — LIDOCAINE-EPINEPHRINE 1 %-1:100000 IJ SOLN
INTRAMUSCULAR | Status: AC
  Filled 2019-01-10: qty 1

## 2019-01-10 MED ORDER — PROPOFOL 1000 MG/100ML IV EMUL
INTRAVENOUS | Status: AC
  Filled 2019-01-10: qty 100

## 2019-01-10 SURGICAL SUPPLY — 74 items
ADH SKN CLS APL DERMABOND .7 (GAUZE/BANDAGES/DRESSINGS) ×1
APL SKNCLS STERI-STRIP NONHPOA (GAUZE/BANDAGES/DRESSINGS) ×1
BAG DECANTER FOR FLEXI CONT (MISCELLANEOUS) ×3 IMPLANT
BENZOIN TINCTURE PRP APPL 2/3 (GAUZE/BANDAGES/DRESSINGS) ×3 IMPLANT
BLADE CLIPPER SURG (BLADE) IMPLANT
BLADE SURG 11 STRL SS (BLADE) ×3 IMPLANT
BUR CUTTER 7.0 ROUND (BURR) ×3 IMPLANT
BUR MATCHSTICK NEURO 3.0 LAGG (BURR) ×3 IMPLANT
CANISTER SUCT 3000ML PPV (MISCELLANEOUS) ×3 IMPLANT
CAP LOCKING (Cap) ×8 IMPLANT
CAP LOCKING 5.5 CREO (Cap) IMPLANT
CARTRIDGE OIL MAESTRO DRILL (MISCELLANEOUS) ×1 IMPLANT
CLOSURE WOUND 1/2 X4 (GAUZE/BANDAGES/DRESSINGS) ×1
CONT SPEC 4OZ CLIKSEAL STRL BL (MISCELLANEOUS) ×3 IMPLANT
COVER BACK TABLE 60X90IN (DRAPES) ×3 IMPLANT
COVER WAND RF STERILE (DRAPES) ×3 IMPLANT
DECANTER SPIKE VIAL GLASS SM (MISCELLANEOUS) ×3 IMPLANT
DERMABOND ADVANCED (GAUZE/BANDAGES/DRESSINGS) ×2
DERMABOND ADVANCED .7 DNX12 (GAUZE/BANDAGES/DRESSINGS) ×1 IMPLANT
DIFFUSER DRILL AIR PNEUMATIC (MISCELLANEOUS) ×3 IMPLANT
DRAPE C-ARM 42X72 X-RAY (DRAPES) ×6 IMPLANT
DRAPE C-ARMOR (DRAPES) IMPLANT
DRAPE HALF SHEET 40X57 (DRAPES) IMPLANT
DRAPE LAPAROTOMY 100X72X124 (DRAPES) ×3 IMPLANT
DRAPE SURG 17X23 STRL (DRAPES) ×3 IMPLANT
DRSG OPSITE 4X5.5 SM (GAUZE/BANDAGES/DRESSINGS) ×3 IMPLANT
DRSG OPSITE POSTOP 3X4 (GAUZE/BANDAGES/DRESSINGS) ×2 IMPLANT
DRSG OPSITE POSTOP 4X6 (GAUZE/BANDAGES/DRESSINGS) ×3 IMPLANT
DURAPREP 26ML APPLICATOR (WOUND CARE) ×3 IMPLANT
ELECT REM PT RETURN 9FT ADLT (ELECTROSURGICAL) ×3
ELECTRODE REM PT RTRN 9FT ADLT (ELECTROSURGICAL) ×1 IMPLANT
EVACUATOR 1/8 PVC DRAIN (DRAIN) ×2 IMPLANT
EVACUATOR 3/16  PVC DRAIN (DRAIN)
EVACUATOR 3/16 PVC DRAIN (DRAIN) IMPLANT
GAUZE 4X4 16PLY RFD (DISPOSABLE) IMPLANT
GAUZE SPONGE 4X4 12PLY STRL (GAUZE/BANDAGES/DRESSINGS) ×3 IMPLANT
GLOVE BIO SURGEON STRL SZ7 (GLOVE) IMPLANT
GLOVE BIO SURGEON STRL SZ8 (GLOVE) ×6 IMPLANT
GLOVE BIOGEL PI IND STRL 7.0 (GLOVE) IMPLANT
GLOVE BIOGEL PI INDICATOR 7.0 (GLOVE)
GLOVE EXAM NITRILE XL STR (GLOVE) IMPLANT
GLOVE INDICATOR 8.5 STRL (GLOVE) ×6 IMPLANT
GOWN STRL REUS W/ TWL LRG LVL3 (GOWN DISPOSABLE) IMPLANT
GOWN STRL REUS W/ TWL XL LVL3 (GOWN DISPOSABLE) ×2 IMPLANT
GOWN STRL REUS W/TWL 2XL LVL3 (GOWN DISPOSABLE) IMPLANT
GOWN STRL REUS W/TWL LRG LVL3 (GOWN DISPOSABLE)
GOWN STRL REUS W/TWL XL LVL3 (GOWN DISPOSABLE) ×6
HEMOSTAT POWDER KIT SURGIFOAM (HEMOSTASIS) IMPLANT
KIT BASIN OR (CUSTOM PROCEDURE TRAY) ×3 IMPLANT
KIT BONE MRW ASP ANGEL CPRP (KITS) ×2 IMPLANT
KIT TURNOVER KIT B (KITS) ×3 IMPLANT
MILL MEDIUM DISP (BLADE) ×3 IMPLANT
NDL HYPO 25X1 1.5 SAFETY (NEEDLE) ×1 IMPLANT
NEEDLE HYPO 25X1 1.5 SAFETY (NEEDLE) ×3 IMPLANT
NS IRRIG 1000ML POUR BTL (IV SOLUTION) ×3 IMPLANT
OIL CARTRIDGE MAESTRO DRILL (MISCELLANEOUS) ×3
PACK LAMINECTOMY NEURO (CUSTOM PROCEDURE TRAY) ×3 IMPLANT
PAD ARMBOARD 7.5X6 YLW CONV (MISCELLANEOUS) ×9 IMPLANT
PUTTY DBM ALLOSYNC PURE 10CC (Putty) ×2 IMPLANT
ROD 40MM SPINAL (Rod) ×4 IMPLANT
SHAFT CREO 30MM (Neuro Prosthesis/Implant) ×8 IMPLANT
SPACER SUSTAIN TI 8X26X11 8D (Spacer) ×4 IMPLANT
SPONGE LAP 4X18 RFD (DISPOSABLE) IMPLANT
SPONGE SURGIFOAM ABS GEL 100 (HEMOSTASIS) ×3 IMPLANT
STRIP CLOSURE SKIN 1/2X4 (GAUZE/BANDAGES/DRESSINGS) ×3 IMPLANT
SUT VIC AB 0 CT1 18XCR BRD8 (SUTURE) ×2 IMPLANT
SUT VIC AB 0 CT1 8-18 (SUTURE) ×6
SUT VIC AB 2-0 CT1 18 (SUTURE) ×3 IMPLANT
SUT VIC AB 4-0 PS2 27 (SUTURE) ×3 IMPLANT
TOWEL GREEN STERILE (TOWEL DISPOSABLE) ×3 IMPLANT
TOWEL GREEN STERILE FF (TOWEL DISPOSABLE) ×3 IMPLANT
TRAY FOLEY MTR SLVR 16FR STAT (SET/KITS/TRAYS/PACK) ×3 IMPLANT
TULIP CREP AMP 5.5MM (Orthopedic Implant) ×8 IMPLANT
WATER STERILE IRR 1000ML POUR (IV SOLUTION) ×3 IMPLANT

## 2019-01-10 NOTE — Progress Notes (Signed)
Reported pt's elevated BP (202/101) to Dr. Fransisco Beau. Verbal order received for 10mg  of IV hydralazine. Will continue to monitor.  Jacqlyn Larsen, RN

## 2019-01-10 NOTE — Anesthesia Preprocedure Evaluation (Addendum)
Anesthesia Evaluation  Patient identified by MRN, date of birth, ID band Patient awake    Reviewed: Allergy & Precautions, NPO status , Patient's Chart, lab work & pertinent test results  History of Anesthesia Complications (+) PONV and history of anesthetic complications  Airway Mallampati: II  TM Distance: >3 FB Neck ROM: Full    Dental  (+) Dental Advisory Given, Teeth Intact   Pulmonary neg pulmonary ROS,    Pulmonary exam normal        Cardiovascular hypertension, Pt. on medications Normal cardiovascular exam   '19 Carotid US - <50% bilateral ICAS    Neuro/Psych PSYCHIATRIC DISORDERS Anxiety TIA   GI/Hepatic Neg liver ROS, hiatal hernia, GERD  Medicated and Controlled, IBS    Endo/Other  negative endocrine ROS  Renal/GU negative Renal ROS     Musculoskeletal  (+) Arthritis , Osteoarthritis,    Abdominal   Peds  Hematology negative hematology ROS (+)   Anesthesia Other Findings   Reproductive/Obstetrics                            Anesthesia Physical Anesthesia Plan  ASA: III  Anesthesia Plan: General   Post-op Pain Management:    Induction: Intravenous  PONV Risk Score and Plan: 4 or greater and Treatment may vary due to age or medical condition, Ondansetron, TIVA and Dexamethasone  Airway Management Planned: Oral ETT  Additional Equipment: None  Intra-op Plan:   Post-operative Plan: Extubation in OR  Informed Consent: I have reviewed the patients History and Physical, chart, labs and discussed the procedure including the risks, benefits and alternatives for the proposed anesthesia with the patient or authorized representative who has indicated his/her understanding and acceptance.     Dental advisory given  Plan Discussed with: CRNA and Anesthesiologist  Anesthesia Plan Comments:        Anesthesia Quick Evaluation

## 2019-01-10 NOTE — H&P (Signed)
Angie Olson is an 68 y.o. female.   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  Patient failed all forms conservative was recommended decompression interbody fusion.  Patient was on the schedule for 2 weeks from now however was in acute pain crisis was admitted to an outside institution had to be sent home early and comes in today for urgent rescheduling of the original planned operation.  We have extensively gone over the risks and benefits of a decompressive laminectomy discectomy and interbody fusion at L2-3.  As well as perioperative course expectations of outcome and alternatives of surgery and she understands and agrees to proceed forward.  Past Medical History:  Diagnosis Date  . Acid reflux   . Allergic rhinitis   . Anxiety    PT'S SON DIED 04-Jul-2010  . Arthritis    RA AND OA  . Borderline hypercholesterolemia    on medication for TIA  . Bronchitis    CHRONIC   . Cancer ([place])    melanoma - face   . Colon polyp   . Environmental allergies   . GERD (gastroesophageal reflux disease)    on PPI and carafte prn  . H/O hiatal hernia   . History of kidney stones   . Hypertension   . IBS (irritable bowel syndrome)   . LBP (low back pain)   . OA (osteoarthritis)    Left Shoulder  . Pain    RIGHT KNEE - TORN MENISCUS AND ACL  . Pneumonia    X 3  - LAST TIME WAS July 04, 2011  . PONV (postoperative nausea and vomiting) 09-2012   sore throat after ankle surgery 6 yrs ago and severe ponv 09-2012  . Pulmonary nodule seen on imaging study   . Shortness of breath    with activity,bronchitis  . Stroke ([place]) 03/2011   TIA--PT EXPERIENCED NUMBNESS RT HAND AND ARM , HEADACHE AND NAUSEA. NO RESIDUAL PROBLEMS    Past Surgical History:  Procedure Laterality Date  . ANKLE  SURGERY  ~10 years ago   left  . BACK SURGERY  07/03/2009   lower  . CHOLECYSTECTOMY  ~15 years ago  . COLONOSCOPY  [id number], 2011/07/04  . HARDWARE REMOVAL N/A 04/07/2016   Procedure: HARDWARE REMOVAL;  Surgeon: Kary Kos, MD;  Location: [facility];  Service: Neurosurgery;  Laterality: N/A;  . HARVEST BONE GRAFT N/A 07/05/2014   Procedure: HARVEST ILIAC BONE GRAFT;  Surgeon: Kary Kos, MD;  Location: [facility];  Service: Neurosurgery;  Laterality: N/A;  . KNEE ARTHROSCOPY Right 10/13/2012   Procedure: RIGHT KNEE ARTHROSCOPY WITH DEBRIDEMENT, CHONDROPLASTY;  Surgeon: Gearlean Alf, MD;  Location: [facility];  Service: Orthopedics;  Laterality: Right;  . KNEE ARTHROSCOPY Right 03/02/2013   Procedure: RIGHT ARTHROSCOPY KNEE WITH MEDIAL MENISCAL  DEBRIDEMENT;  Surgeon: Gearlean Alf, MD;  Location: [facility];  Service: Orthopedics;  Laterality: Right;  . LUMBAR FUSION  07/05/2014  . LUMBAR WOUND DEBRIDEMENT N/A 01/11/2014   Procedure: Irrigation and Debridement Lumbar Wound for hematoma;  Surgeon: Elaina Hoops, MD;  Location: [facility];  Service: Neurosurgery;  Laterality: N/A;  . MAXIMUM ACCESS (MAS)POSTERIOR LUMBAR INTERBODY FUSION (PLIF) 1 LEVEL N/A 12/05/2013   Procedure: FOR MAXIMUM ACCESS (MAS) POSTERIOR LUMBAR INTERBODY FUSION (PLIF)LUMBAR FIVE-SACRAL-ONE,REMOVAL HARDWARE LUMBAR FOUR-FIVE;  Surgeon: Elaina Hoops, MD;  Location: [facility];  Service: Neurosurgery;  Laterality: N/A;  . Mohr's procedure  02/2015   face  . Right Hip arthroplasty     10/01/16 Dr. Wynelle Link  . ROOT CANAL    . SHOULDER SURGERY Left ~14 years ago   "scope"  . thumb surgery Left ~14 years ago   x2  . TOTAL HIP ARTHROPLASTY Left 04/25/2015   Procedure: LEFT TOTAL HIP ARTHROPLASTY ANTERIOR APPROACH;  Surgeon: Gaynelle Arabian, MD;  [facility];  Service: Orthopedics;  Laterality: Left;  . TOTAL HIP ARTHROPLASTY Right 10/01/2016   Procedure: RIGHT TOTAL HIP ARTHROPLASTY ANTERIOR APPROACH;  Surgeon: Gaynelle Arabian, MD;  [facility];  Service: Orthopedics;  Laterality: Right;  . TOTAL KNEE ARTHROPLASTY Right 07/25/2013   Procedure: RIGHT TOTAL KNEE ARTHROPLASTY;  Surgeon: Gearlean Alf, MD;  [facility];  Service: Orthopedics;  Laterality: Right;  . TOTAL KNEE ARTHROPLASTY WITH REVISION COMPONENTS Right 05/28/2016   Procedure: RIGHT TIBIAL POLYETHYLENE KNEE REVISION;  Surgeon: Gaynelle Arabian, MD;  Location: [facility];  Service: Orthopedics;  Laterality: Right;  requests 68mins  . TRIGGER FINGER RELEASE  ~6 years ago   X2 ON RIGHT HAND  . TUBAL LIGATION      Family History  Problem Relation Age of Onset  . Heart disease Sister   . Lung cancer Brother   . Heart disease Brother   . CAD Brother   . Heart attack Mother   . Emphysema Father   . Hypertension Daughter   . Colon cancer Neg Hx    Social History:  reports that she has never smoked. She has never used smokeless tobacco. She reports that she does not drink alcohol or use drugs.  Allergies:  Allergies  Allergen Reactions  . Methotrexate Derivatives Other (See Comments)    Causes recurrent infections  . Pseudoephedrine Other (See Comments)    Makes jittery   . Reglan [Metoclopramide] Other (See Comments)    insomnia  . Vicodin [Hydrocodone-Acetaminophen] Other (See Comments)    "Makes me feel weird"  . Oxycodone Itching and Rash    Patient tolerated hydromorphone without reaction.    Medications Prior to Admission  Medication Sig Dispense Refill  . ALPRAZolam (XANAX) 1 MG tablet Take 1 mg by mouth 2 (two) times daily as needed for anxiety.     Marland Kitchen amLODipine (NORVASC) 5 MG tablet Take 5 mg by mouth daily.     Marland Kitchen atorvastatin (LIPITOR) 20 MG tablet Take 20 mg by mouth daily.     . cetirizine (ZYRTEC) 10 MG tablet Take 10 mg by mouth daily.    . Cholecalciferol (VITAMIN D3) 1.25 MG (50000 UT) CAPS Take 50,000 Units by mouth every Sunday.    . esomeprazole (NEXIUM) 20 MG capsule Take 20 mg by mouth daily before breakfast.     . gabapentin  (NEURONTIN) 300 MG capsule Take 300 mg by mouth 3 (three) times daily.    Marland Kitchen HYDROcodone-acetaminophen (NORCO) 10-325 MG tablet Take 1 tablet by mouth every 4 (four) hours as needed for pain.    . hydroxychloroquine (PLAQUENIL) 200 MG tablet Take 200 mg by mouth 2 (two) times daily.    Marland Kitchen leflunomide (ARAVA) 20 MG tablet Take 20 mg by mouth daily.    Marland Kitchen lisinopril (PRINIVIL,ZESTRIL) 40 MG tablet Take 40 mg by mouth daily.    . methocarbamol (ROBAXIN) 750 MG tablet Take 750 mg by mouth every 4 (four) hours.    . nortriptyline (  PAMELOR) 25 MG capsule Take 25 mg by mouth at bedtime.   2  . predniSONE (DELTASONE) 20 MG tablet Take 20 mg by mouth 3 (three) times daily with meals.    . predniSONE (DELTASONE) 5 MG tablet Take 5 mg by mouth daily. Continuous for rheumatoid arthritis    . sucralfate (CARAFATE) 1 G tablet Take 1 g by mouth 3 (three) times daily as needed (indigestion).     Marland Kitchen acetaminophen (TYLENOL 8 HOUR ARTHRITIS PAIN) 650 MG CR tablet Take 1,300 mg by mouth every 8 (eight) hours as needed for pain.      Results for orders placed or performed during the hospital encounter of 01/10/19 (from the past 48 hour(s))  Type and screen     Status: None   Collection Time: 01/10/19 12:30 PM  Result Value Ref Range   ABO/RH(D) A NEG    Antibody Screen NEG    Sample Expiration      01/13/2019,2359 Performed at [facility], [place] [address] [place], [place] [id number]   Basic metabolic panel     Status: Abnormal   Collection Time: 01/10/19 12:31 PM  Result Value Ref Range   Sodium 140 135 - 145 mmol/L   Potassium 3.6 3.5 - 5.1 mmol/L   Chloride 106 98 - 111 mmol/L   CO2 23 22 - 32 mmol/L   Glucose, Bld 142 (H) 70 - 99 mg/dL   BUN 21 8 - 23 mg/dL   Creatinine, Ser 0.70 0.44 - 1.00 mg/dL   Calcium 8.8 (L) 8.9 - 10.3 mg/dL   GFR calc non Af Amer >60 >60 mL/min   GFR calc Af Amer >60 >60 mL/min   Anion gap 11 5 - 15    Comment: Performed at [facility], [place] [address],  [place], [place] [address]  CBC     Status: Abnormal   Collection Time: 01/10/19 12:31 PM  Result Value Ref Range   WBC 7.9 4.0 - 10.5 K/uL   RBC 4.24 3.87 - 5.11 MIL/uL   Hemoglobin 11.9 (L) 12.0 - 15.0 g/dL   HCT 37.1 36.0 - 46.0 %   MCV 87.5 80.0 - 100.0 fL   MCH 28.1 26.0 - 34.0 pg   MCHC 32.1 30.0 - 36.0 g/dL   RDW 13.7 11.5 - 15.5 %   Platelets 292 150 - 400 K/uL   nRBC 0.0 0.0 - 0.2 %    Comment: Performed at [facility], [place] [address], [place], [place] [address]   No results found.  Review of Systems  Musculoskeletal: Positive for back pain.  Neurological: Positive for tingling, sensory change and focal weakness.    Blood pressure (!) 189/92, pulse 93, temperature 98.7 F (37.1 C), temperature source Oral, resp. rate 18, height 5\' 5"  (1.651 m), weight 74.4 kg, SpO2 95 %. Physical Exam  Constitutional: She is oriented to person, place, and time. She appears well-developed.  HENT:  Head: Normocephalic.  Eyes: Pupils are equal, round, and reactive to light.  Neck: Normal range of motion.  Cardiovascular: Normal rate.  Respiratory: Effort normal.  GI: Soft.  Musculoskeletal: Normal range of motion.  Neurological: She is alert and oriented to person, place, and time. She has normal strength. GCS eye subscore is 4. GCS verbal subscore is 5. GCS motor subscore is 6.  Strength is 5 out of 5 iliopsoas, quads, hamstrings, gastroc, and tibialis, and EHL on the left.  On the right she has weakness in her  right iliopsoas and quadricep at 4 out of 5.  Skin: Skin is warm and dry.     Assessment/Plan Six 7-year female presents for L2-3 decompression fusion  Compton Brigance P, MD 01/10/2019, 1:38 PM

## 2019-01-10 NOTE — Anesthesia Procedure Notes (Signed)
Procedure Name: Intubation Date/Time: 01/10/2019 2:11 PM Performed by: Moshe Salisbury, CRNA Pre-anesthesia Checklist: Patient identified, Emergency Drugs available, Suction available and Patient being monitored Patient Re-evaluated:Patient Re-evaluated prior to induction Oxygen Delivery Method: Circle System Utilized Preoxygenation: Pre-oxygenation with 100% oxygen Induction Type: IV induction Ventilation: Mask ventilation without difficulty Laryngoscope Size: Mac and 3 Grade View: Grade II Tube type: Oral Tube size: 7.5 mm Number of attempts: 1 Airway Equipment and Method: Stylet Placement Confirmation: ETT inserted through vocal cords under direct vision,  positive ETCO2 and breath sounds checked- equal and bilateral Secured at: 21 cm Tube secured with: Tape Dental Injury: Teeth and Oropharynx as per pre-operative assessment

## 2019-01-10 NOTE — Op Note (Signed)
Preoperative diagnosis: Herniated nucleus pulposus and lumbar instability L2-3  Postoperative diagnosis: Same  Procedure: Decompressive lumbar laminectomy L2-3 in excess and requiring more work than would be needed with a standard interbody fusion with complete medial facetectomies and radical foraminotomies of the L2 and L3 nerve roots.  2.  Posterior lumbar interbody fusion L2-3 utilizing the globus insert and rotate titanium cages back with locally harvested autograft mixed with Arthrex biologic with bone marrow aspirate  3.  Cortical screw fixation L2-3 utilizing the globus Creo amp modular cortical screw set  4.  Open reduction spinal deformity L2-3  Surgeon: Dominica Severin Drae Mitzel  Assistant: Ashok Pall  Anesthesia: General  EBL: Minimal  HPI: 68 year old female has had progressive worsening back and predominately right leg pain work-up revealed large disc herniation at L2-3 that extended last week requiring emergent admission for pain control done outside hospital.  Patient was transferred here and patient recommended decompressive laminectomy and interbody fusion L2-3 I extensively went over the risks and benefits of that operation with her as well as perioperative course expectations of outcome and alternatives of surgery and she understood and agreed to proceed forward.  Operative procedure: Patient brought into the OR was used and general anesthesia positioned prone the Wilson frame her back was prepped and draped in routine sterile fashion.  An incision was drawn out superior to her previous surgery infiltrated with 10 cc lidocaine with epi subperiosteal dissection carried lamina of L2 and L3 confirmed by intraoperative x-ray.  At this point I drilled down the facet joints remove the spinous process performed a complete decompressive laminectomy with complete medial facetectomies and radical foraminotomies of the L2 and L3 nerve root.  A very large explanation with multiple free fragments was  made identified that a migrated superiorly displaced displaced in the L2 and L3 nerve against the respective pedicles.  The disc base was very collapsed posteriorly I did open up by removing some posterior osteophyte utilizing an osteotome the disc base posteriorly and then sequential dilation with a 10 distractor in place I selected an 8 to 1126 mm insert and rotate cage packed with locally harvested autograft.  I aspirated bone marrow through a separate skin incision and mixed it with the Arthrex bone extra mix that with the autograft and inserted it in each cage and centrally.  With the sequential distraction the cage insertion I reduce the deformity at L2-3.  Then under fluoroscopy I placed bilateral cortical screws in routine sterile fashion.  All screws had excellent purchase postop fluoroscopy confirmed good position of all the implants.  Wounds and copiously irrigated meticulous hemostasis was maintained Gelfoam was overlaid top of the dura the heads were assembled the screws rods were selected the screws were tightened down to decompress L2 against L3 to increase some lordosis then reinspected all the foramina to confirm patency and no migration of graft material placed a medium Hemovac drain injected Exparel in the fascia.  I then closed the wound in layers with Vicryl in a running 4 subcuticular Dermabond benzoin Steri-Strips and and a sterile dressing was applied and patient recovery in stable condition.  At the end the case all needle count sponge counts were correct.

## 2019-01-10 NOTE — Transfer of Care (Signed)
Immediate Anesthesia Transfer of Care Note  Patient: Angie Olson  Procedure(s) Performed: Lumbar Two-Three Posterior lumbar interbody fusion (N/A Spine Lumbar)  Patient Location: PACU  Anesthesia Type:General  Level of Consciousness: drowsy and patient cooperative  Airway & Oxygen Therapy: Patient Spontanous Breathing and Patient connected to face mask oxygen  Post-op Assessment: Report given to RN, Post -op Vital signs reviewed and stable and Patient moving all extremities  Post vital signs: Reviewed and stable  Last Vitals:  Vitals Value Taken Time  BP 110/66 01/10/19 1727  Temp    Pulse 85 01/10/19 1731  Resp 7 01/10/19 1731  SpO2 100 % 01/10/19 1731  Vitals shown include unvalidated device data.  Last Pain:  Vitals:   01/10/19 1242  TempSrc:   PainSc: 4       Patients Stated Pain Goal: 3 ([id number])  Complications: No apparent anesthesia complications

## 2019-01-11 MED ORDER — HYDROXYZINE HCL 50 MG/ML IM SOLN
50.0000 mg | Freq: Four times a day (QID) | INTRAMUSCULAR | Status: DC | PRN
  Administered 2019-01-11: 50 mg via INTRAMUSCULAR
  Filled 2019-01-11: qty 1

## 2019-01-11 MED ORDER — DEXAMETHASONE SODIUM PHOSPHATE 10 MG/ML IJ SOLN
10.0000 mg | Freq: Four times a day (QID) | INTRAMUSCULAR | Status: DC
  Administered 2019-01-11 (×3): 10 mg via INTRAVENOUS
  Filled 2019-01-11 (×2): qty 1

## 2019-01-11 MED ORDER — ONDANSETRON HCL 4 MG/2ML IJ SOLN
4.0000 mg | INTRAMUSCULAR | Status: DC | PRN
  Administered 2019-01-11: 4 mg via INTRAVENOUS
  Filled 2019-01-11: qty 2

## 2019-01-11 MED ORDER — ONDANSETRON HCL 4 MG PO TABS: 4.0000 mg | ORAL_TABLET | ORAL | Status: DC | PRN

## 2019-01-11 NOTE — Progress Notes (Signed)
Subjective: Patient reports Patient with condition of back pain and left-sided back hip and stomach pain overnight improved this morning she was passing gas last night  Objective: Vital signs in last 24 hours: Temp:  [97.9 F (36.6 C)-98.7 F (37.1 C)] 98.7 F (37.1 C) (10/27 0720) Pulse Rate:  [83-109] 97 (10/27 0720) Resp:  [10-20] 16 (10/27 0720) BP: (109-199)/(64-103) 140/64 (10/27 0720) SpO2:  [95 %-100 %] 98 % (10/27 0720) Weight:  [74.4 kg] 74.4 kg (10/26 1204)  Intake/Output from previous day: 10/26 0701 - 10/27 0700 In: 1600 [I.V.:1600] Out: 1250 [Urine:750; Drains:200; Blood:300] Intake/Output this shift: No intake/output data recorded.  Strength out of 5 wound clean dry and intact  Lab Results: Recent Labs    01/10/19 1231  WBC 7.9  HGB 11.9*  HCT 37.1  PLT 292   BMET Recent Labs    01/10/19 1231  NA 140  K 3.6  CL 106  CO2 23  GLUCOSE 142*  BUN 21  CREATININE 0.70  CALCIUM 8.8*    Studies/Results: Dg Lumbar Spine 2-3 Views  Result Date: 01/10/2019 CLINICAL DATA:  Lumbar spine surgery. EXAM: LUMBAR SPINE - 2-3 VIEW; DG C-ARM 1-60 MIN COMPARISON:  Lumbar spine MRI 12/01/2018 FLUOROSCOPY TIME:  Fluoroscopy Time:  47 seconds FINDINGS: Two intraoperative fluoroscopic images of the lumbar spine are provided. There has been interval posterior decompression at L2-3 with placement of bilateral pedicle screws and interbody cages. IMPRESSION: Intraoperative images during L2-3 fusion. Electronically Signed   By: Logan Bores M.D.   On: 01/10/2019 18:52   Dg C-arm 1-60 Min  Result Date: 01/10/2019 CLINICAL DATA:  Lumbar spine surgery. EXAM: LUMBAR SPINE - 2-3 VIEW; DG C-ARM 1-60 MIN COMPARISON:  Lumbar spine MRI 12/01/2018 FLUOROSCOPY TIME:  Fluoroscopy Time:  47 seconds FINDINGS: Two intraoperative fluoroscopic images of the lumbar spine are provided. There has been interval posterior decompression at L2-3 with placement of bilateral pedicle screws and  interbody cages. IMPRESSION: Intraoperative images during L2-3 fusion. Electronically Signed   By: Logan Bores M.D.   On: 01/10/2019 18:52    Assessment/Plan: Postop day 1 decompression fusion L2-3 I think most of the left-sided back and hip pain is just a dissection preoperative radicular pain is resolved.  Mobilize this morning in her brace with physical therapy try Decadron to help with inflammation  LOS: 1 day     Angie Olson P 01/11/2019, 8:10 AM

## 2019-01-11 NOTE — Anesthesia Postprocedure Evaluation (Signed)
Anesthesia Post Note  Patient: Angie Olson  Procedure(s) Performed: Lumbar Two-Three Posterior lumbar interbody fusion (N/A Spine Lumbar)     Patient location during evaluation: PACU Anesthesia Type: General Level of consciousness: awake and alert Pain management: pain level controlled Vital Signs Assessment: post-procedure vital signs reviewed and stable Respiratory status: spontaneous breathing, nonlabored ventilation, respiratory function stable and patient connected to nasal cannula oxygen Cardiovascular status: blood pressure returned to baseline and stable Postop Assessment: no apparent nausea or vomiting Anesthetic complications: no Comments: Patient required narcan in PACU shortly after arrival following brief desaturation event from oversedation. Oxygen saturations quickly recovered with ambubag mask until patient returned breathing adequately on her own. No complications noted.    Last Vitals:  Vitals:   01/10/19 2329 01/11/19 0312  BP: (!) 157/83 (!) 141/72  Pulse: 97 93  Resp: 18 20  Temp: 36.7 C 37 C  SpO2: 98% 100%    Last Pain:  Vitals:   01/11/19 0533  TempSrc:   PainSc: Allen Brock

## 2019-01-11 NOTE — Evaluation (Signed)
Physical Therapy Evaluation Patient Details Name: Angie Olson MRN: [id number] DOB: 1951-01-23 Today's Date: 01/11/2019   History of Present Illness  Pt is a 68 y/o female who presents s/p L2-L3 PLIF on 01/10/2019. PMH significant for CVA/TIA, SOB, OA, HTN, CA, R TKR, bilateral THR, prior back surgeries 2011/2015/2016, L ankle surgery 2010.   Clinical Impression  Pt admitted with above diagnosis. At the time of PT eval, pt was able to demonstrate transfers and ambulation with gross min guard assist to supervision for safety. Pt was educated on precautions, brace wearing schedule (not present during evaluation), appropriate activity progression, and car transfer. Pt currently with functional limitations due to the deficits listed below (see PT Problem List). Pt will benefit from skilled PT to increase their independence and safety with mobility to allow discharge to the venue listed below.      Follow Up Recommendations No PT follow up;Supervision for mobility/OOB    Equipment Recommendations  Rolling walker with 5" wheels    Recommendations for Other Services       Precautions / Restrictions Precautions Precautions: Fall;Back Precaution Booklet Issued: Yes (comment) Precaution Comments: Reviewed handout and pt was cued for precautions during functional mobility.  Required Braces or Orthoses: Spinal Brace Spinal Brace: Lumbar corset(Ordered but not received yet) Restrictions Weight Bearing Restrictions: No      Mobility  Bed Mobility Overal bed mobility: Needs Assistance Bed Mobility: Sidelying to Sit;Sit to Sidelying   Sidelying to sit: Supervision     Sit to sidelying: Supervision General bed mobility comments: HOB flat and rails lowered to simulate home environment. No assist required.   Transfers Overall transfer level: Needs assistance Equipment used: Rolling walker (2 wheeled) Transfers: Sit to/from Stand Sit to Stand: Min guard         General transfer  comment: Close guard for safety as pt powered up to full stand. VC's for hand placement on seated surface for safety.   Ambulation/Gait Ambulation/Gait assistance: Min guard; Supervision Gait Distance (Feet): 225 Feet Assistive device: Rolling walker (2 wheeled) Gait Pattern/deviations: Step-through pattern;Decreased stride length;Trunk flexed Gait velocity: Decreased Gait velocity interpretation: <1.8 ft/sec, indicate of risk for recurrent falls General Gait Details: VC's for improved posture, closer walker proximity, and forward gaze. No assist required however hands-on guarding provided throughout gait training for safety.   Stairs Stairs: Yes Stairs assistance: Min guard Stair Management: One rail Left;Step to pattern;Forwards Number of Stairs: 4 General stair comments: VC's for sequencing and general safety. No assist required.   Wheelchair Mobility    Modified Rankin (Stroke Patients Only)       Balance Overall balance assessment: Needs assistance Sitting-balance support: Feet supported;No upper extremity supported Sitting balance-Leahy Scale: Fair     Standing balance support: Bilateral upper extremity supported;During functional activity Standing balance-Leahy Scale: Poor Standing balance comment: Reliant on UE support on walker during OOB mobility.                              Pertinent Vitals/Pain Pain Assessment: Faces Faces Pain Scale: Hurts little more Pain Location: Incision site Pain Descriptors / Indicators: Operative site guarding;Grimacing Pain Intervention(s): Limited activity within patient's tolerance;Monitored during session;Repositioned    Home Living Family/patient expects to be discharged to:: Private residence Living Arrangements: Spouse/significant other Available Help at Discharge: Family;Available 24 hours/day Type of Home: House Home Access: Stairs to enter Entrance Stairs-Rails: Left Entrance Stairs-Number of Steps: 3 Home  Layout: Multi-level;Able to live  on main level with bedroom/bathroom Home Equipment: Walker - 2 wheels;Walker - 4 wheels;Bedside commode;Shower seat;Cane - single point Additional Comments: Pt's husband uses their RW, so she will need one at d/c. Daughter and granddaughter are coming to assist pt and take care of pt's husband who pt has been caring for prior to surgery.     Prior Function Level of Independence: Independent         Comments: Pt has been the primary caregiver for her husband who has Parkinson's Disease and dementia. She reports having to physically assist him and do all of his ADL's.     Hand Dominance   Dominant Hand: Right    Extremity/Trunk Assessment   Upper Extremity Assessment Upper Extremity Assessment: Defer to OT evaluation    Lower Extremity Assessment Lower Extremity Assessment: Generalized weakness    Cervical / Trunk Assessment Cervical / Trunk Assessment: Other exceptions Cervical / Trunk Exceptions: s/p surgery  Communication   Communication: No difficulties  Cognition Arousal/Alertness: Awake/alert(Mostly - eyes closed at times) Behavior During Therapy: WFL for tasks assessed/performed Overall Cognitive Status: Within Functional Limits for tasks assessed                                        General Comments      Exercises     Assessment/Plan    PT Assessment Patient needs continued PT services  PT Problem List Decreased strength;Decreased activity tolerance;Decreased balance;Decreased mobility;Decreased knowledge of use of DME;Decreased knowledge of precautions;Decreased safety awareness;Pain       PT Treatment Interventions DME instruction;Gait training;Stair training;Functional mobility training;Therapeutic activities;Therapeutic exercise;Neuromuscular re-education;Patient/family education    PT Goals (Current goals can be found in the Care Plan section)  Acute Rehab PT Goals Patient Stated Goal: Home today PT  Goal Formulation: With patient Time For Goal Achievement: 01/18/19 Potential to Achieve Goals: Good    Frequency Min 5X/week   Barriers to discharge        Co-evaluation               AM-PAC PT "6 Clicks" Mobility  Outcome Measure Help needed turning from your back to your side while in a flat bed without using bedrails?: None Help needed moving from lying on your back to sitting on the side of a flat bed without using bedrails?: None Help needed moving to and from a bed to a chair (including a wheelchair)?: A Little Help needed standing up from a chair using your arms (e.g., wheelchair or bedside chair)?: A Little Help needed to walk in hospital room?: A Little Help needed climbing 3-5 steps with a railing? : A Little 6 Click Score: 20    End of Session Equipment Utilized During Treatment: Gait belt Activity Tolerance: Patient tolerated treatment well Patient left: in bed;with call bell/phone within reach Nurse Communication: Mobility status PT Visit Diagnosis: Unsteadiness on feet (R26.81);Pain Pain - part of body: (back)    Time: [id number] PT Time Calculation (min) (ACUTE ONLY): 15 min   Charges:   PT Evaluation $PT Eval Moderate Complexity: 1 Mod          Rolinda Roan, PT, DPT Acute Rehabilitation Services Pager: [phone] Office: [phone]   Thelma Comp 01/11/2019, 11:06 AM

## 2019-01-11 NOTE — Plan of Care (Signed)
Patient alert and oriented, Angie Olson's well, voiding adequate amount of urine, swallowing without difficulty, c/o moderate pain and meds given prior to discharged for ride and discomfort. Patient discharged home with family. Script and discharged instructions given to patient. Patient and family stated understanding of instructions given. Patient has an appointment with Dr. Saintclair Halsted

## 2019-01-11 NOTE — Discharge Instructions (Signed)

## 2019-01-11 NOTE — Discharge Summary (Signed)
Physician Discharge Summary  Patient ID: Angie Olson MRN: [id number] DOB/AGE: 07-06-1950 81 y.o. Estimated body mass index is 27.29 kg/m as calculated from the following:   Height as of this encounter: 5\' 5"  (1.651 m).   Weight as of this encounter: 74.4 kg.   Admit date: 01/10/2019 Discharge date: 01/11/2019  Admission Diagnoses: L2-3 decompression and interbody fusion  Discharge Diagnoses: Active Problems:   HNP (herniated nucleus pulposus), lumbar   Discharged Condition: good  Hospital Course: Patient is admitted to hospital underwent decompression interbody fusion postoperative patient did fairly well was ambulating voiding and due to an extensive difficult social situation at home she wants to go home.  We extensively talked about taking multiple rest stops along the way home take care of her self and resting when she got home.  We will discharge her with scheduled follow-up in 1 to 2 weeks.  Consults: Significant Diagnostic Studies: Treatments: Decompression interbody fusion Discharge Exam: Blood pressure (!) 148/82, pulse (!) 105, temperature 98.8 F (37.1 C), temperature source Oral, resp. rate 16, height 5\' 5"  (1.651 m), weight 74.4 kg, SpO2 98 %. Strength 5-5 wound clean dry and intact  Disposition: Home  Discharge Instructions    Walker rolling   Complete by: As directed      Allergies as of 01/11/2019      Reactions   Methotrexate Derivatives Other (See Comments)   Causes recurrent infections   Pseudoephedrine Other (See Comments)   Makes jittery    Reglan [metoclopramide] Other (See Comments)   insomnia   Vicodin [hydrocodone-acetaminophen] Other (See Comments)   "Makes me feel weird"   Oxycodone Itching, Rash   Patient tolerated hydromorphone without reaction.      Medication List    TAKE these medications   ALPRAZolam 1 MG tablet Commonly known as: XANAX Take 1 mg by mouth 2 (two) times daily as needed for anxiety.   amLODipine 5 MG  tablet Commonly known as: NORVASC Take 5 mg by mouth daily.   atorvastatin 20 MG tablet Commonly known as: LIPITOR Take 20 mg by mouth daily.   cetirizine 10 MG tablet Commonly known as: ZYRTEC Take 10 mg by mouth daily.   esomeprazole 20 MG capsule Commonly known as: NEXIUM Take 20 mg by mouth daily before breakfast.   gabapentin 300 MG capsule Commonly known as: NEURONTIN Take 300 mg by mouth 3 (three) times daily.   HYDROcodone-acetaminophen 10-325 MG tablet Commonly known as: NORCO Take 1 tablet by mouth every 4 (four) hours as needed for pain.   hydroxychloroquine 200 MG tablet Commonly known as: PLAQUENIL Take 200 mg by mouth 2 (two) times daily.   leflunomide 20 MG tablet Commonly known as: ARAVA Take 20 mg by mouth daily.   lisinopril 40 MG tablet Commonly known as: ZESTRIL Take 40 mg by mouth daily.   methocarbamol 750 MG tablet Commonly known as: ROBAXIN Take 750 mg by mouth every 4 (four) hours.   nortriptyline 25 MG capsule Commonly known as: PAMELOR Take 25 mg by mouth at bedtime.   predniSONE 5 MG tablet Commonly known as: DELTASONE Take 5 mg by mouth daily. Continuous for rheumatoid arthritis   predniSONE 20 MG tablet Commonly known as: DELTASONE Take 20 mg by mouth 3 (three) times daily with meals.   sucralfate 1 g tablet Commonly known as: CARAFATE Take 1 g by mouth 3 (three) times daily as needed (indigestion).   Tylenol 8 Hour Arthritis Pain 650 MG CR tablet Generic drug: acetaminophen Take 1,300  mg by mouth every 8 (eight) hours as needed for pain.   Vitamin D3 1.25 MG (50000 UT) Caps Take 50,000 Units by mouth every Sunday.        Signed: Norvell Caswell P 01/11/2019, 4:31 PM

## 2019-01-16 DIAGNOSIS — Z8616 Personal history of COVID-19: Secondary | ICD-10-CM

## 2019-01-16 HISTORY — DX: Personal history of COVID-19: Z86.16

## 2019-01-27 ENCOUNTER — Other Ambulatory Visit: Payer: Self-pay | Admitting: Neurosurgery

## 2019-01-27 ENCOUNTER — Other Ambulatory Visit (HOSPITAL_COMMUNITY)
Admission: RE | Admit: 2019-01-27 | Discharge: 2019-01-27 | Disposition: A | Discharge status: Home / Self Care | Payer: Medicare Other | Source: Ambulatory Visit | Attending: Neurosurgery | Admitting: Neurosurgery

## 2019-01-27 ENCOUNTER — Other Ambulatory Visit (HOSPITAL_COMMUNITY): Payer: Self-pay | Admitting: Neurosurgery

## 2019-01-27 DIAGNOSIS — Z01812 Encounter for preprocedural laboratory examination: Secondary | ICD-10-CM | POA: Diagnosis present

## 2019-01-27 DIAGNOSIS — M4807 Spinal stenosis, lumbosacral region: Secondary | ICD-10-CM

## 2019-01-27 DIAGNOSIS — Z20828 Contact with and (suspected) exposure to other viral communicable diseases: Secondary | ICD-10-CM | POA: Insufficient documentation

## 2019-01-28 ENCOUNTER — Other Ambulatory Visit: Payer: Self-pay

## 2019-01-28 ENCOUNTER — Encounter (HOSPITAL_COMMUNITY): Payer: Self-pay

## 2019-01-28 LAB — NOVEL CORONAVIRUS, NAA (HOSP ORDER, SEND-OUT TO REF LAB; TAT 18-24 HRS): SARS-CoV-2, NAA: NOT DETECTED

## 2019-01-28 NOTE — Pre-Procedure Instructions (Signed)
Same Day Work-Up Pre-Procedure Instructions:              Your procedure is scheduled on Monday, November 16th from 09:45 AM to 1:12 PM.              Report to [facility] Main Entrance "A" at 07:15 A.M., and check in at the Admitting office.              Call this number if you have problems the morning of surgery:              [phone]                Remember:              Do not eat or drink after midnight the night before your surgery                           Take these medicines the morning of surgery with A SIP OF WATER  amLODipine (NORVASC) atorvastatin (LIPITOR) cetirizine (ZYRTEC) esomeprazole (NEXIUM) gabapentin (NEURONTIN) methylPREDNISolone (MEDROL DOSEPAK)  predniSONE (DELTASONE) hydroxychloroquine (PLAQUENIL) leflunomide (ARAVA)  IF NEEDED: acetaminophen (TYLENOL 8 HOUR ARTHRITIS PAIN) HYDROmorphone (DILAUDID) methocarbamol (ROBAXIN) sucralfate (CARAFATE)    As of today, STOP taking any Aspirin (unless otherwise instructed by your surgeon), Aleve, Naproxen, Ibuprofen, Motrin, Advil, Goody's, BC's, all herbal medications, fish oil, and all vitamins.                The Morning of Surgery              Do not wear jewelry, make-up or nail polish.              Do not wear lotions, powders, perfumes, or deodorant              Do not shave 48 hours prior to surgery.                Do not bring valuables to the hospital.              [facility] is not responsible for any belongings or valuables.  If you are a smoker, DO NOT Smoke 24 hours prior to surgery IF you wear a CPAP at night please bring your mask, tubing, and machine the morning of surgery   Remember that you must have someone to transport you home after your surgery, and remain with you for 24 hours if you are discharged the same day.   Contacts, glasses, hearing aids, dentures or bridgework may not be worn into surgery.   Leave your suitcase in the car.  After surgery it may be brought  to your room.  For patients admitted to the hospital, discharge time will be determined by your treatment team.  Patients discharged the day of surgery will not be allowed to drive home.    Special instructions:   [place]- Preparing For Surgery  Please follow these instructions carefully.  1. Shower the NIGHT BEFORE SURGERY and the MORNING OF SURGERY with antibacterial soap, such as Dial with a scrungie or a clean washcloth..   2. You may wash your hair with your normal shampoo.  3. Wash thoroughly, paying special attention to the area where your surgery will be performed.  4. Pat yourself dry with a CLEAN TOWEL.  5. Wear CLEAN PAJAMAS to bed the night before surgery, wear comfortable clothes the morning of surgery  6. Place CLEAN SHEETS on your bed the night of your first shower and DO NOT SLEEP WITH PETS.    Day of Surgery: Do not apply any deodorants/lotions. Please wear clean clothes to the hospital/surgery center.   Remember to brush your teeth WITH YOUR REGULAR TOOTHPASTE.

## 2019-01-28 NOTE — Progress Notes (Addendum)
Same Day Work-Up   PCP - Velna Hatchet, MD Cardiologist - denies Rheumatologist- Berna Bue, MD  PPM/ICD - denies Device Orders - N/A Rep Notified - N/A  Chest x-ray - N/A EKG - 01/10/2019 Stress Test - > 10 years ago, with negative results ECHO - 06/28/2013 Cardiac Cath - denies  Sleep Study - denies  Fasting Blood Sugar - denies, states she was pre-diabetic years ago. Does not check blood sugar. States she has lost weight.  Blood Thinner Instructions: denies Aspirin Instructions: denies  ERAS Protcol - None  COVID TEST- 01/27/2019, "Not Detected"   Anesthesia review: Yes  Patient denies shortness of breath, fever, cough and chest pain at PAT appointment   Coronavirus Screening  Have you experienced the following symptoms:  Cough yes/no: No Fever (>100.47F)  yes/no: No Runny nose yes/no: No Sore throat yes/no: No Difficulty breathing/shortness of breath  yes/no: No  Have you or a family member traveled in the last 14 days and where? yes/no: No   If the patient indicates "YES" to the above questions, their PAT will be rescheduled to limit the exposure to others and, the surgeon will be notified. THE PATIENT WILL NEED TO BE ASYMPTOMATIC FOR 14 DAYS.   If the patient is not experiencing any of these symptoms, the PAT nurse will instruct them to NOT bring anyone with them to their appointment since they may have these symptoms or traveled as well.   Please remind your patients and families that hospital visitation restrictions are in effect and the importance of the restrictions.    All instructions explained to the patient, with a verbal understanding of the material. Patient agrees to go over the instructions while at home for a better understanding. Patient also instructed to self quarantine after being tested for COVID-19. The opportunity to ask questions was provided.

## 2019-01-28 NOTE — Progress Notes (Signed)
Anesthesia Chart Review: SAME DAY WORK-UP   Case: [id number] Date/Time: 01/31/19 0930   Procedure: PLIF - L3-L4 with exploration fusion removal and replacement of hardware L2-3 - Posterior Lateral - Interbody Fusion (N/A Back)   Anesthesia type: General   Pre-op diagnosis: Stenosis   Location: [facility] OR ROOM 18 / [place] OR   Surgeon: Kary Kos, MD      DISCUSSION: Patient is a 68 year old female scheduled for the above procedure.    History includes HTN, GERD, hiatal hernia, CVA (TIA 03/2011), prediabetes, postoperative N/V, exertional dyspnea, melanoma (face), RA.   - S/p decompressive lumbar laminectomy L2-3, PLIF L2-3 01/10/2019. According to Anesthesia Post Note, she required Narcan in PACU shortly after arrival following brief desaturation event from oversedation.  Oxygen saturations quickly recovered with Ambu bag mask until patient returned breathing adequately on her own.  01/27/19 COVID-19 test negative.  She is a same-day work-up.  Anesthesia team to evaluate on the day of surgery.   PROVIDERS: Velna Hatchet, MD is PCP Gavin Pound, MD is rheumatologist  - She is not followed routinely by cardiologist, but saw Fransico Him, MD in 06/20/2013 for fatigue.  Stress and echo done (see below).   LABS: She is for updated labs no arrival. As of 01/10/19, H/H 11.9/37.1, PLT 292, glucose 142, Cr 0.70.   EKG: 01/10/19: Sinus rhythm Minimal voltage criteria for LVH, may be normal variant ( Cornell product ) Abnormal ECG No significant change since last tracing Confirmed by Candee Furbish [phone]) on 01/10/2019 12:50:31 PM   CV: Nuclear stress test 06/29/13: Overall Impression:  Normal stress nuclear study. LV Ejection Fraction: 66%.  LV Wall Motion:  NL LV Function; NL Wall Motion   Echo 06/28/13: Study Conclusions  Left ventricle: The cavity size was normal. Systolic  function was normal. The estimated ejection fraction was in  the range of 60% to 65%. Wall motion was normal; there were   no regional wall motion abnormalities. Doppler parameters  are consistent with abnormal left ventricular relaxation  (grade 1 diastolic dysfunction).   Carotid US 03/31/11: IMPRESSION: No evidence of right carotid stenosis.  Minimal plaque on the left with estimated less than 50% ICA stenosis.   Past Medical History:  Diagnosis Date  . Acid reflux   . Allergic rhinitis   . Anxiety    PT'S SON DIED 06/21/2010  . Arthritis    RA AND OA  . Borderline hypercholesterolemia    on medication for TIA  . Bronchitis    CHRONIC   . Cancer ([place])    melanoma - face   . Colon polyp   . Environmental allergies   . GERD (gastroesophageal reflux disease)    on PPI and carafte prn  . H/O hiatal hernia   . History of kidney stones   . Hypertension   . IBS (irritable bowel syndrome)   . LBP (low back pain)   . OA (osteoarthritis)    Left Shoulder  . Pain    RIGHT KNEE - TORN MENISCUS AND ACL  . Pneumonia    X 3  - LAST TIME WAS 06/21/2011  . PONV (postoperative nausea and vomiting) 09-2012   sore throat after ankle surgery 6 yrs ago and severe ponv 09-2012  . Pulmonary nodule seen on imaging study   . Shortness of breath    with activity,bronchitis  . Stroke ([place]) 03/2011   TIA--PT EXPERIENCED NUMBNESS RT HAND AND ARM , HEADACHE AND NAUSEA. NO RESIDUAL PROBLEMS    Past Surgical  History:  Procedure Laterality Date  . ANKLE SURGERY  ~10 years ago   left  . BACK SURGERY  2011   lower  . CHOLECYSTECTOMY  ~15 years ago  . COLONOSCOPY  [id number], 2013  . HARDWARE REMOVAL N/A 04/07/2016   Procedure: HARDWARE REMOVAL;  Surgeon: Kary Kos, MD;  Location: [place];  Service: Neurosurgery;  Laterality: N/A;  . HARVEST BONE GRAFT N/A 07/05/2014   Procedure: HARVEST ILIAC BONE GRAFT;  Surgeon: Kary Kos, MD;  Location: [place] NEURO ORS;  Service: Neurosurgery;  Laterality: N/A;  . KNEE ARTHROSCOPY Right 10/13/2012   Procedure: RIGHT KNEE ARTHROSCOPY WITH DEBRIDEMENT, CHONDROPLASTY;  Surgeon: Gearlean Alf,  MD;  Location: [facility];  Service: Orthopedics;  Laterality: Right;  . KNEE ARTHROSCOPY Right 03/02/2013   Procedure: RIGHT ARTHROSCOPY KNEE WITH MEDIAL MENISCAL  DEBRIDEMENT;  Surgeon: Gearlean Alf, MD;  Location: [facility];  Service: Orthopedics;  Laterality: Right;  . LUMBAR FUSION  07/05/2014  . LUMBAR WOUND DEBRIDEMENT N/A 01/11/2014   Procedure: Irrigation and Debridement Lumbar Wound for hematoma;  Surgeon: Elaina Hoops, MD;  Location: [place] NEURO ORS;  Service: Neurosurgery;  Laterality: N/A;  . MAXIMUM ACCESS (MAS)POSTERIOR LUMBAR INTERBODY FUSION (PLIF) 1 LEVEL N/A 12/05/2013   Procedure: FOR MAXIMUM ACCESS (MAS) POSTERIOR LUMBAR INTERBODY FUSION (PLIF)LUMBAR FIVE-SACRAL-ONE,REMOVAL HARDWARE LUMBAR FOUR-FIVE;  Surgeon: Elaina Hoops, MD;  Location: [place] NEURO ORS;  Service: Neurosurgery;  Laterality: N/A;  . Mohr's procedure  02/2015   face  . Right Hip arthroplasty     10/01/16 Dr. Wynelle Link  . ROOT CANAL    . SHOULDER SURGERY Left ~14 years ago   "scope"  . thumb surgery Left ~14 years ago   x2  . TOTAL HIP ARTHROPLASTY Left 04/25/2015   Procedure: LEFT TOTAL HIP ARTHROPLASTY ANTERIOR APPROACH;  Surgeon: Gaynelle Arabian, MD;  Location: [facility];  Service: Orthopedics;  Laterality: Left;  . TOTAL HIP ARTHROPLASTY Right 10/01/2016   Procedure: RIGHT TOTAL HIP ARTHROPLASTY ANTERIOR APPROACH;  Surgeon: Gaynelle Arabian, MD;  Location: [facility];  Service: Orthopedics;  Laterality: Right;  . TOTAL KNEE ARTHROPLASTY Right 07/25/2013   Procedure: RIGHT TOTAL KNEE ARTHROPLASTY;  Surgeon: Gearlean Alf, MD;  Location: [facility];  Service: Orthopedics;  Laterality: Right;  . TOTAL KNEE ARTHROPLASTY WITH REVISION COMPONENTS Right 05/28/2016   Procedure: RIGHT TIBIAL POLYETHYLENE KNEE REVISION;  Surgeon: Gaynelle Arabian, MD;  Location: [facility];  Service: Orthopedics;  Laterality: Right;  requests 12mins  . TRIGGER FINGER RELEASE  ~6 years ago   X2 ON RIGHT HAND  . TUBAL LIGATION      MEDICATIONS: No current  facility-administered medications for this encounter.    Marland Kitchen acetaminophen (TYLENOL 8 HOUR ARTHRITIS PAIN) 650 MG CR tablet  . ALPRAZolam (XANAX) 1 MG tablet  . amLODipine (NORVASC) 5 MG tablet  . atorvastatin (LIPITOR) 20 MG tablet  . cetirizine (ZYRTEC) 10 MG tablet  . Cholecalciferol (VITAMIN D3) 1.25 MG (50000 UT) CAPS  . esomeprazole (NEXIUM) 20 MG capsule  . gabapentin (NEURONTIN) 300 MG capsule  . HYDROmorphone (DILAUDID) 4 MG tablet  . hydroxychloroquine (PLAQUENIL) 200 MG tablet  . leflunomide (ARAVA) 20 MG tablet  . lisinopril (PRINIVIL,ZESTRIL) 40 MG tablet  . methocarbamol (ROBAXIN) 750 MG tablet  . methylPREDNISolone (MEDROL DOSEPAK) 4 MG TBPK tablet  . nortriptyline (PAMELOR) 25 MG capsule  . predniSONE (DELTASONE) 5 MG tablet  . sucralfate (CARAFATE) 1 G tablet     Myra Gianotti, PA-C Surgical Short Stay/Anesthesiology [facility] Phone [phone]  [facility] Phone [phone] 01/28/2019 3:15 PM

## 2019-01-28 NOTE — Anesthesia Preprocedure Evaluation (Addendum)
Anesthesia Evaluation  Patient identified by MRN, date of birth, ID band Patient awake    Reviewed: Allergy & Precautions, NPO status , Patient's Chart, lab work & pertinent test results  History of Anesthesia Complications (+) PONV  Airway Mallampati: II  TM Distance: >3 FB Neck ROM: Full    Dental  (+) Teeth Intact, Dental Advisory Given   Pulmonary    breath sounds clear to auscultation       Cardiovascular hypertension, Pt. on medications  Rhythm:Regular Rate:Normal     Neuro/Psych Anxiety CVA    GI/Hepatic Neg liver ROS, hiatal hernia, GERD  Medicated,  Endo/Other  negative endocrine ROS  Renal/GU      Musculoskeletal  (+) Arthritis ,   Abdominal Normal abdominal exam  (+)   Peds  Hematology negative hematology ROS (+)   Anesthesia Other Findings   Reproductive/Obstetrics                           Anesthesia Physical Anesthesia Plan  ASA: III  Anesthesia Plan: General   Post-op Pain Management:    Induction: Intravenous  PONV Risk Score and Plan: 4 or greater and Ondansetron, Dexamethasone, Treatment may vary due to age or medical condition, Midazolam and Scopolamine patch - Pre-op  Airway Management Planned: Oral ETT  Additional Equipment: None  Intra-op Plan:   Post-operative Plan: Extubation in OR  Informed Consent: I have reviewed the patients History and Physical, chart, labs and discussed the procedure including the risks, benefits and alternatives for the proposed anesthesia with the patient or authorized representative who has indicated his/her understanding and acceptance.     Dental advisory given  Plan Discussed with: CRNA  Anesthesia Plan Comments: (PAT note written 01/28/2019 by Myra Gianotti, PA-C.  - S/p decompressive lumbar laminectomy L2-3, PLIF L2-3 01/10/2019. Required Narcan in PACU shortly after arrival following brief desaturation event from  oversedation.  Oxygen saturations quickly recovered with Ambu bag mask until patient returned breathing adequately on her own. )     Anesthesia Quick Evaluation

## 2019-01-31 ENCOUNTER — Inpatient Hospital Stay (HOSPITAL_COMMUNITY): Payer: Medicare Other | Admitting: Vascular Surgery

## 2019-01-31 ENCOUNTER — Ambulatory Visit (HOSPITAL_COMMUNITY)
Admission: RE | Admit: 2019-01-31 | Discharge: 2019-01-31 | Disposition: A | Discharge status: Home / Self Care | Payer: Medicare Other | Source: Ambulatory Visit | Attending: Neurosurgery | Admitting: Neurosurgery

## 2019-01-31 ENCOUNTER — Other Ambulatory Visit: Payer: Self-pay

## 2019-01-31 ENCOUNTER — Inpatient Hospital Stay (HOSPITAL_COMMUNITY): Payer: Medicare Other

## 2019-01-31 ENCOUNTER — Encounter (HOSPITAL_COMMUNITY)
Admission: RE | Disposition: A | Discharge status: Home / Self Care | Payer: Self-pay | Source: Home / Self Care | Attending: Neurosurgery

## 2019-01-31 ENCOUNTER — Inpatient Hospital Stay (HOSPITAL_COMMUNITY)
Admission: RE | Admit: 2019-01-31 | Discharge: 2019-02-03 | DRG: 455 | Disposition: A | Discharge status: Home Health | Payer: Medicare Other | Attending: Neurosurgery | Admitting: Neurosurgery

## 2019-01-31 DIAGNOSIS — K219 Gastro-esophageal reflux disease without esophagitis: Secondary | ICD-10-CM | POA: Diagnosis present

## 2019-01-31 DIAGNOSIS — Z7952 Long term (current) use of systemic steroids: Secondary | ICD-10-CM | POA: Diagnosis not present

## 2019-01-31 DIAGNOSIS — Z85820 Personal history of malignant melanoma of skin: Secondary | ICD-10-CM

## 2019-01-31 DIAGNOSIS — I10 Essential (primary) hypertension: Secondary | ICD-10-CM | POA: Diagnosis present

## 2019-01-31 DIAGNOSIS — Z96643 Presence of artificial hip joint, bilateral: Secondary | ICD-10-CM | POA: Diagnosis present

## 2019-01-31 DIAGNOSIS — Z20828 Contact with and (suspected) exposure to other viral communicable diseases: Secondary | ICD-10-CM | POA: Diagnosis present

## 2019-01-31 DIAGNOSIS — Z8673 Personal history of transient ischemic attack (TIA), and cerebral infarction without residual deficits: Secondary | ICD-10-CM

## 2019-01-31 DIAGNOSIS — M545 Low back pain: Secondary | ICD-10-CM | POA: Diagnosis present

## 2019-01-31 DIAGNOSIS — Z419 Encounter for procedure for purposes other than remedying health state, unspecified: Secondary | ICD-10-CM

## 2019-01-31 DIAGNOSIS — Y832 Surgical operation with anastomosis, bypass or graft as the cause of abnormal reaction of the patient, or of later complication, without mention of misadventure at the time of the procedure: Secondary | ICD-10-CM | POA: Diagnosis present

## 2019-01-31 DIAGNOSIS — Z96651 Presence of right artificial knee joint: Secondary | ICD-10-CM | POA: Diagnosis present

## 2019-01-31 DIAGNOSIS — T84226A Displacement of internal fixation device of vertebrae, initial encounter: Principal | ICD-10-CM | POA: Diagnosis present

## 2019-01-31 DIAGNOSIS — E7800 Pure hypercholesterolemia, unspecified: Secondary | ICD-10-CM | POA: Diagnosis present

## 2019-01-31 DIAGNOSIS — M5126 Other intervertebral disc displacement, lumbar region: Secondary | ICD-10-CM | POA: Diagnosis present

## 2019-01-31 DIAGNOSIS — M4807 Spinal stenosis, lumbosacral region: Secondary | ICD-10-CM | POA: Insufficient documentation

## 2019-01-31 HISTORY — DX: Adverse effect of unspecified anesthetic, initial encounter: T41.45XA

## 2019-01-31 HISTORY — DX: Prediabetes: R73.03

## 2019-01-31 LAB — CBC
HCT: 33.4 % — ABNORMAL LOW (ref 36.0–46.0)
Hemoglobin: 10.1 g/dL — ABNORMAL LOW (ref 12.0–15.0)
MCH: 26.9 pg (ref 26.0–34.0)
MCHC: 30.2 g/dL (ref 30.0–36.0)
MCV: 88.8 fL (ref 80.0–100.0)
Platelets: 345 10*3/uL (ref 150–400)
RBC: 3.76 MIL/uL — ABNORMAL LOW (ref 3.87–5.11)
RDW: 13.8 % (ref 11.5–15.5)
WBC: 8.5 10*3/uL (ref 4.0–10.5)
nRBC: 0 % (ref 0.0–0.2)

## 2019-01-31 LAB — BASIC METABOLIC PANEL
Anion gap: 12 (ref 5–15)
BUN: 14 mg/dL (ref 8–23)
CO2: 27 mmol/L (ref 22–32)
Calcium: 8.6 mg/dL — ABNORMAL LOW (ref 8.9–10.3)
Chloride: 101 mmol/L (ref 98–111)
Creatinine, Ser: 0.71 mg/dL (ref 0.44–1.00)
GFR calc Af Amer: >60 mL/min (ref >60)
GFR calc non Af Amer: >60 mL/min (ref >60)
Glucose, Bld: 113 mg/dL — ABNORMAL HIGH (ref 70–99)
Potassium: 4 mmol/L (ref 3.5–5.1)
Sodium: 140 mmol/L (ref 135–145)

## 2019-01-31 LAB — TYPE AND SCREEN
ABO/RH(D): A NEG
Antibody Screen: NEGATIVE

## 2019-01-31 SURGERY — POSTERIOR LUMBAR FUSION 1 WITH HARDWARE REMOVAL
Anesthesia: General | Site: Back

## 2019-01-31 MED ORDER — SUGAMMADEX SODIUM 200 MG/2ML IV SOLN
INTRAVENOUS | Status: DC | PRN
  Administered 2019-01-31: 200 mg via INTRAVENOUS

## 2019-01-31 MED ORDER — PHENOL 1.4 % MT LIQD: 1.0000 | OROMUCOSAL | Status: DC | PRN

## 2019-01-31 MED ORDER — HEPARIN SODIUM (PORCINE) 1000 UNIT/ML IJ SOLN
INTRAMUSCULAR | Status: AC
  Filled 2019-01-31: qty 1

## 2019-01-31 MED ORDER — PHENYLEPHRINE HCL (PRESSORS) 10 MG/ML IV SOLN
INTRAVENOUS | Status: AC
  Filled 2019-01-31: qty 1

## 2019-01-31 MED ORDER — LIDOCAINE 2% (20 MG/ML) 5 ML SYRINGE
INTRAMUSCULAR | Status: DC | PRN
  Administered 2019-01-31: 50 mg via INTRAVENOUS

## 2019-01-31 MED ORDER — LACTATED RINGERS IV SOLN
INTRAVENOUS | Status: DC
  Administered 2019-01-31: 08:00:00 via INTRAVENOUS

## 2019-01-31 MED ORDER — METHOCARBAMOL 750 MG PO TABS
750.0000 mg | ORAL_TABLET | ORAL | Status: DC
  Administered 2019-01-31 – 2019-02-03 (×17): 750 mg via ORAL
  Filled 2019-01-31 (×17): qty 1

## 2019-01-31 MED ORDER — LACTATED RINGERS IV SOLN
INTRAVENOUS | Status: DC | PRN
  Administered 2019-01-31 (×2): via INTRAVENOUS

## 2019-01-31 MED ORDER — THROMBIN 5000 UNITS EX SOLR
CUTANEOUS | Status: AC
  Filled 2019-01-31: qty 5000

## 2019-01-31 MED ORDER — SODIUM CHLORIDE 0.9% FLUSH: 3.0000 mL | INTRAVENOUS | Status: DC | PRN

## 2019-01-31 MED ORDER — ONDANSETRON HCL 4 MG/2ML IJ SOLN
INTRAMUSCULAR | Status: AC
  Filled 2019-01-31: qty 2

## 2019-01-31 MED ORDER — SODIUM CHLORIDE 0.9% FLUSH
3.0000 mL | Freq: Two times a day (BID) | INTRAVENOUS | Status: DC
  Administered 2019-01-31: 3 mL via INTRAVENOUS

## 2019-01-31 MED ORDER — KETAMINE HCL 50 MG/5ML IJ SOSY
PREFILLED_SYRINGE | INTRAMUSCULAR | Status: AC
  Filled 2019-01-31: qty 5

## 2019-01-31 MED ORDER — SCOPOLAMINE 1 MG/3DAYS TD PT72
MEDICATED_PATCH | TRANSDERMAL | Status: AC
  Filled 2019-01-31: qty 1

## 2019-01-31 MED ORDER — PREDNISONE 5 MG PO TABS
5.0000 mg | ORAL_TABLET | Freq: Every day | ORAL | Status: DC
  Administered 2019-02-01 – 2019-02-03 (×3): 5 mg via ORAL
  Filled 2019-01-31 (×3): qty 1

## 2019-01-31 MED ORDER — HYDROMORPHONE HCL 1 MG/ML IJ SOLN
INTRAMUSCULAR | Status: AC
  Filled 2019-01-31: qty 1

## 2019-01-31 MED ORDER — ONDANSETRON HCL 4 MG/2ML IJ SOLN
INTRAMUSCULAR | Status: DC | PRN
  Administered 2019-01-31: 4 mg via INTRAVENOUS

## 2019-01-31 MED ORDER — ACETAMINOPHEN 325 MG PO TABS: 650.0000 mg | ORAL_TABLET | ORAL | Status: DC | PRN

## 2019-01-31 MED ORDER — DEXAMETHASONE SODIUM PHOSPHATE 10 MG/ML IJ SOLN
INTRAMUSCULAR | Status: AC
  Filled 2019-01-31: qty 1

## 2019-01-31 MED ORDER — CEFAZOLIN SODIUM-DEXTROSE 2-4 GM/100ML-% IV SOLN
INTRAVENOUS | Status: AC
  Filled 2019-01-31: qty 100

## 2019-01-31 MED ORDER — PANTOPRAZOLE SODIUM 40 MG IV SOLR: 40.0000 mg | Freq: Every day | INTRAVENOUS | Status: DC

## 2019-01-31 MED ORDER — PANTOPRAZOLE SODIUM 40 MG PO TBEC
40.0000 mg | DELAYED_RELEASE_TABLET | Freq: Every day | ORAL | Status: DC
  Administered 2019-02-01 – 2019-02-03 (×3): 40 mg via ORAL
  Filled 2019-01-31 (×3): qty 1

## 2019-01-31 MED ORDER — CHLORHEXIDINE GLUCONATE CLOTH 2 % EX PADS: 6.0000 | MEDICATED_PAD | Freq: Once | CUTANEOUS | Status: DC

## 2019-01-31 MED ORDER — MENTHOL 3 MG MT LOZG: 1.0000 | LOZENGE | OROMUCOSAL | Status: DC | PRN

## 2019-01-31 MED ORDER — LEFLUNOMIDE 20 MG PO TABS
20.0000 mg | ORAL_TABLET | Freq: Every day | ORAL | Status: DC
  Administered 2019-02-01 – 2019-02-03 (×3): 20 mg via ORAL
  Filled 2019-01-31 (×3): qty 1

## 2019-01-31 MED ORDER — FENTANYL CITRATE (PF) 250 MCG/5ML IJ SOLN
INTRAMUSCULAR | Status: DC | PRN
  Administered 2019-01-31: 25 ug via INTRAVENOUS
  Administered 2019-01-31: 50 ug via INTRAVENOUS
  Administered 2019-01-31: 100 ug via INTRAVENOUS
  Administered 2019-01-31: 25 ug via INTRAVENOUS

## 2019-01-31 MED ORDER — CYCLOBENZAPRINE HCL 10 MG PO TABS
10.0000 mg | ORAL_TABLET | Freq: Three times a day (TID) | ORAL | Status: DC | PRN
  Administered 2019-01-31: 10 mg via ORAL
  Filled 2019-01-31: qty 1

## 2019-01-31 MED ORDER — SODIUM CHLORIDE 0.9 % IV SOLN
INTRAVENOUS | Status: DC | PRN
  Administered 2019-01-31: 500 mL

## 2019-01-31 MED ORDER — ALPRAZOLAM 0.5 MG PO TABS
1.0000 mg | ORAL_TABLET | Freq: Every day | ORAL | Status: DC
  Administered 2019-01-31 – 2019-02-02 (×3): 1 mg via ORAL
  Filled 2019-01-31 (×3): qty 2

## 2019-01-31 MED ORDER — ACETAMINOPHEN 325 MG PO TABS: 325.0000 mg | ORAL_TABLET | Freq: Once | ORAL | Status: DC | PRN

## 2019-01-31 MED ORDER — LIDOCAINE-EPINEPHRINE 1 %-1:100000 IJ SOLN
INTRAMUSCULAR | Status: DC | PRN
  Administered 2019-01-31: 5 mL

## 2019-01-31 MED ORDER — ACETAMINOPHEN 10 MG/ML IV SOLN
INTRAVENOUS | Status: DC | PRN
  Administered 2019-01-31: 1000 mg via INTRAVENOUS

## 2019-01-31 MED ORDER — LORATADINE 10 MG PO TABS
10.0000 mg | ORAL_TABLET | Freq: Every day | ORAL | Status: DC
  Administered 2019-02-01 – 2019-02-03 (×3): 10 mg via ORAL
  Filled 2019-01-31 (×3): qty 1

## 2019-01-31 MED ORDER — HYDROMORPHONE HCL 1 MG/ML IJ SOLN
0.2500 mg | INTRAMUSCULAR | Status: DC | PRN
  Administered 2019-01-31: 0.25 mg via INTRAVENOUS
  Administered 2019-01-31: 0.5 mg via INTRAVENOUS
  Administered 2019-01-31: 0.25 mg via INTRAVENOUS

## 2019-01-31 MED ORDER — MIDAZOLAM HCL 2 MG/2ML IJ SOLN
INTRAMUSCULAR | Status: AC
  Filled 2019-01-31: qty 2

## 2019-01-31 MED ORDER — MEPERIDINE HCL 25 MG/ML IJ SOLN: 6.2500 mg | INTRAMUSCULAR | Status: DC | PRN

## 2019-01-31 MED ORDER — FENTANYL CITRATE (PF) 250 MCG/5ML IJ SOLN
INTRAMUSCULAR | Status: AC
  Filled 2019-01-31: qty 5

## 2019-01-31 MED ORDER — PROPOFOL 10 MG/ML IV BOLUS
INTRAVENOUS | Status: AC
  Filled 2019-01-31: qty 20

## 2019-01-31 MED ORDER — ACETAMINOPHEN 160 MG/5ML PO SOLN: 325.0000 mg | Freq: Once | ORAL | Status: DC | PRN

## 2019-01-31 MED ORDER — HYDROMORPHONE HCL 2 MG PO TABS: 2.0000 mg | ORAL_TABLET | ORAL | Status: DC | PRN

## 2019-01-31 MED ORDER — THROMBIN 20000 UNITS EX SOLR
CUTANEOUS | Status: DC | PRN
  Administered 2019-01-31: 20 mL via TOPICAL

## 2019-01-31 MED ORDER — BUPIVACAINE HCL (PF) 0.25 % IJ SOLN
INTRAMUSCULAR | Status: AC
  Filled 2019-01-31: qty 30

## 2019-01-31 MED ORDER — ACETAMINOPHEN 10 MG/ML IV SOLN: 1000.0000 mg | Freq: Once | INTRAVENOUS | Status: DC | PRN

## 2019-01-31 MED ORDER — HYDROXYCHLOROQUINE SULFATE 200 MG PO TABS
200.0000 mg | ORAL_TABLET | Freq: Two times a day (BID) | ORAL | Status: DC
  Administered 2019-01-31 – 2019-02-03 (×6): 200 mg via ORAL
  Filled 2019-01-31 (×8): qty 1

## 2019-01-31 MED ORDER — ROCURONIUM BROMIDE 10 MG/ML (PF) SYRINGE
PREFILLED_SYRINGE | INTRAVENOUS | Status: DC | PRN
  Administered 2019-01-31 (×2): 50 mg via INTRAVENOUS

## 2019-01-31 MED ORDER — SUCRALFATE 1 G PO TABS
1.0000 g | ORAL_TABLET | Freq: Three times a day (TID) | ORAL | Status: DC | PRN
  Filled 2019-01-31: qty 1

## 2019-01-31 MED ORDER — ALUM & MAG HYDROXIDE-SIMETH 200-200-20 MG/5ML PO SUSP: 30.0000 mL | Freq: Four times a day (QID) | ORAL | Status: DC | PRN

## 2019-01-31 MED ORDER — SCOPOLAMINE 1 MG/3DAYS TD PT72
MEDICATED_PATCH | TRANSDERMAL | Status: DC | PRN
  Administered 2019-01-31: 1 via TRANSDERMAL

## 2019-01-31 MED ORDER — VITAMIN D (ERGOCALCIFEROL) 1.25 MG (50000 UNIT) PO CAPS: 50000.0000 [IU] | ORAL_CAPSULE | ORAL | Status: DC

## 2019-01-31 MED ORDER — NORTRIPTYLINE HCL 25 MG PO CAPS
25.0000 mg | ORAL_CAPSULE | Freq: Every day | ORAL | Status: DC
  Administered 2019-01-31 – 2019-02-02 (×3): 25 mg via ORAL
  Filled 2019-01-31 (×5): qty 1

## 2019-01-31 MED ORDER — CEFAZOLIN SODIUM-DEXTROSE 2-4 GM/100ML-% IV SOLN
2.0000 g | Freq: Three times a day (TID) | INTRAVENOUS | Status: AC
  Administered 2019-01-31 – 2019-02-01 (×2): 2 g via INTRAVENOUS
  Filled 2019-01-31 (×2): qty 100

## 2019-01-31 MED ORDER — BUPIVACAINE LIPOSOME 1.3 % IJ SUSP
INTRAMUSCULAR | Status: DC | PRN
  Administered 2019-01-31: 20 mL

## 2019-01-31 MED ORDER — MIDAZOLAM HCL 2 MG/2ML IJ SOLN
INTRAMUSCULAR | Status: DC | PRN
  Administered 2019-01-31: 2 mg via INTRAVENOUS

## 2019-01-31 MED ORDER — LIDOCAINE-EPINEPHRINE 1 %-1:100000 IJ SOLN
INTRAMUSCULAR | Status: AC
  Filled 2019-01-31: qty 1

## 2019-01-31 MED ORDER — PHENYLEPHRINE HCL-NACL 10-0.9 MG/250ML-% IV SOLN
INTRAVENOUS | Status: DC | PRN
  Administered 2019-01-31: 30 ug/min via INTRAVENOUS

## 2019-01-31 MED ORDER — SODIUM CHLORIDE 0.9 % IV SOLN: 250.0000 mL | INTRAVENOUS | Status: DC

## 2019-01-31 MED ORDER — PROMETHAZINE HCL 25 MG/ML IJ SOLN: 6.2500 mg | INTRAMUSCULAR | Status: DC | PRN

## 2019-01-31 MED ORDER — HYDROMORPHONE HCL 1 MG/ML IJ SOLN: 1.0000 mg | INTRAMUSCULAR | Status: DC | PRN

## 2019-01-31 MED ORDER — LACTATED RINGERS IV SOLN: INTRAVENOUS | Status: DC

## 2019-01-31 MED ORDER — METHYLPREDNISOLONE 4 MG PO TBPK: 4.0000 mg | ORAL_TABLET | Freq: Every day | ORAL | Status: DC

## 2019-01-31 MED ORDER — ROCURONIUM BROMIDE 10 MG/ML (PF) SYRINGE
PREFILLED_SYRINGE | INTRAVENOUS | Status: AC
  Filled 2019-01-31: qty 10

## 2019-01-31 MED ORDER — DEXAMETHASONE SODIUM PHOSPHATE 10 MG/ML IJ SOLN
10.0000 mg | Freq: Once | INTRAMUSCULAR | Status: DC
  Filled 2019-01-31: qty 1

## 2019-01-31 MED ORDER — THROMBIN 20000 UNITS EX SOLR
CUTANEOUS | Status: AC
  Filled 2019-01-31: qty 20000

## 2019-01-31 MED ORDER — ATORVASTATIN CALCIUM 10 MG PO TABS
20.0000 mg | ORAL_TABLET | Freq: Every day | ORAL | Status: DC
  Administered 2019-01-31 – 2019-02-03 (×4): 20 mg via ORAL
  Filled 2019-01-31 (×3): qty 2

## 2019-01-31 MED ORDER — HYDROMORPHONE HCL 2 MG PO TABS
4.0000 mg | ORAL_TABLET | ORAL | Status: DC | PRN
  Administered 2019-01-31 – 2019-02-03 (×13): 4 mg via ORAL
  Filled 2019-01-31 (×13): qty 2

## 2019-01-31 MED ORDER — 0.9 % SODIUM CHLORIDE (POUR BTL) OPTIME
TOPICAL | Status: DC | PRN
  Administered 2019-01-31: 1000 mL

## 2019-01-31 MED ORDER — PHENYLEPHRINE 40 MCG/ML (10ML) SYRINGE FOR IV PUSH (FOR BLOOD PRESSURE SUPPORT)
PREFILLED_SYRINGE | INTRAVENOUS | Status: DC | PRN
  Administered 2019-01-31 (×2): 80 ug via INTRAVENOUS

## 2019-01-31 MED ORDER — DEXAMETHASONE SODIUM PHOSPHATE 10 MG/ML IJ SOLN
INTRAMUSCULAR | Status: DC | PRN
  Administered 2019-01-31: 10 mg via INTRAVENOUS

## 2019-01-31 MED ORDER — PROPOFOL 10 MG/ML IV BOLUS
INTRAVENOUS | Status: DC | PRN
  Administered 2019-01-31: 130 mg via INTRAVENOUS

## 2019-01-31 MED ORDER — BUPIVACAINE LIPOSOME 1.3 % IJ SUSP
20.0000 mL | Freq: Once | INTRAMUSCULAR | Status: DC
  Filled 2019-01-31: qty 20

## 2019-01-31 MED ORDER — ACETAMINOPHEN 10 MG/ML IV SOLN
INTRAVENOUS | Status: AC
  Filled 2019-01-31: qty 100

## 2019-01-31 MED ORDER — GABAPENTIN 300 MG PO CAPS
600.0000 mg | ORAL_CAPSULE | Freq: Three times a day (TID) | ORAL | Status: DC
  Administered 2019-01-31 – 2019-02-03 (×9): 600 mg via ORAL
  Filled 2019-01-31 (×9): qty 2

## 2019-01-31 MED ORDER — AMLODIPINE BESYLATE 5 MG PO TABS
5.0000 mg | ORAL_TABLET | Freq: Every day | ORAL | Status: DC
  Administered 2019-02-01 – 2019-02-03 (×3): 5 mg via ORAL
  Filled 2019-01-31 (×3): qty 1

## 2019-01-31 MED ORDER — VITAMIN D3 1.25 MG (50000 UT) PO CAPS: 50000.0000 [IU] | ORAL_CAPSULE | ORAL | Status: DC

## 2019-01-31 MED ORDER — LIDOCAINE 2% (20 MG/ML) 5 ML SYRINGE
INTRAMUSCULAR | Status: AC
  Filled 2019-01-31: qty 5

## 2019-01-31 MED ORDER — KETAMINE HCL 10 MG/ML IJ SOLN
INTRAMUSCULAR | Status: DC | PRN
  Administered 2019-01-31: 20 mg via INTRAVENOUS

## 2019-01-31 MED ORDER — ACETAMINOPHEN 650 MG RE SUPP: 650.0000 mg | RECTAL | Status: DC | PRN

## 2019-01-31 MED ORDER — ONDANSETRON HCL 4 MG/2ML IJ SOLN
4.0000 mg | Freq: Four times a day (QID) | INTRAMUSCULAR | Status: DC | PRN
  Administered 2019-01-31: 4 mg via INTRAVENOUS
  Filled 2019-01-31: qty 2

## 2019-01-31 MED ORDER — CEFAZOLIN SODIUM-DEXTROSE 2-4 GM/100ML-% IV SOLN
2.0000 g | INTRAVENOUS | Status: AC
  Administered 2019-01-31: 2 g via INTRAVENOUS

## 2019-01-31 MED ORDER — LISINOPRIL 20 MG PO TABS
40.0000 mg | ORAL_TABLET | Freq: Every day | ORAL | Status: DC
  Administered 2019-02-01 – 2019-02-02 (×2): 40 mg via ORAL
  Filled 2019-01-31 (×2): qty 2

## 2019-01-31 MED ORDER — ONDANSETRON HCL 4 MG PO TABS: 4.0000 mg | ORAL_TABLET | Freq: Four times a day (QID) | ORAL | Status: DC | PRN

## 2019-01-31 SURGICAL SUPPLY — 83 items
ADH SKN CLS APL DERMABOND .7 (GAUZE/BANDAGES/DRESSINGS) ×1
APL SKNCLS STERI-STRIP NONHPOA (GAUZE/BANDAGES/DRESSINGS) ×1
BAG DECANTER FOR FLEXI CONT (MISCELLANEOUS) ×3 IMPLANT
BASKET BONE COLLECTION (BASKET) ×2 IMPLANT
BENZOIN TINCTURE PRP APPL 2/3 (GAUZE/BANDAGES/DRESSINGS) ×3 IMPLANT
BLADE CLIPPER SURG (BLADE) IMPLANT
BLADE SURG 11 STRL SS (BLADE) ×3 IMPLANT
BONE VIVIGEN FORMABLE 10CC (Bone Implant) ×3 IMPLANT
BUR CUTTER 7.0 ROUND (BURR) ×3 IMPLANT
BUR MATCHSTICK NEURO 3.0 LAGG (BURR) ×3 IMPLANT
CAGE ALTERA 10X26 9-13 8D (Cage) ×1 IMPLANT
CAGE ALTERA 26MM 9-13-8 (Cage) ×1 IMPLANT
CANISTER SUCT 3000ML PPV (MISCELLANEOUS) ×3 IMPLANT
CARTRIDGE OIL MAESTRO DRILL (MISCELLANEOUS) ×1 IMPLANT
CLOSURE WOUND 1/2 X4 (GAUZE/BANDAGES/DRESSINGS) ×1
CONT SPEC 4OZ CLIKSEAL STRL BL (MISCELLANEOUS) ×3 IMPLANT
COVER BACK TABLE 60X90IN (DRAPES) ×3 IMPLANT
COVER WAND RF STERILE (DRAPES) ×3 IMPLANT
DECANTER SPIKE VIAL GLASS SM (MISCELLANEOUS) ×3 IMPLANT
DERMABOND ADVANCED (GAUZE/BANDAGES/DRESSINGS) ×2
DERMABOND ADVANCED .7 DNX12 (GAUZE/BANDAGES/DRESSINGS) ×1 IMPLANT
DIFFUSER DRILL AIR PNEUMATIC (MISCELLANEOUS) ×3 IMPLANT
DRAPE C-ARM 42X72 X-RAY (DRAPES) ×4 IMPLANT
DRAPE C-ARMOR (DRAPES) ×2 IMPLANT
DRAPE HALF SHEET 40X57 (DRAPES) IMPLANT
DRAPE LAPAROTOMY 100X72X124 (DRAPES) ×3 IMPLANT
DRAPE SURG 17X23 STRL (DRAPES) ×3 IMPLANT
DRSG OPSITE 4X5.5 SM (GAUZE/BANDAGES/DRESSINGS) ×3 IMPLANT
DRSG OPSITE POSTOP 4X6 (GAUZE/BANDAGES/DRESSINGS) ×1 IMPLANT
DRSG OPSITE POSTOP 4X8 (GAUZE/BANDAGES/DRESSINGS) ×2 IMPLANT
DURAPREP 26ML APPLICATOR (WOUND CARE) ×3 IMPLANT
ELECT BLADE 4.0 EZ CLEAN MEGAD (MISCELLANEOUS) ×3
ELECT REM PT RETURN 9FT ADLT (ELECTROSURGICAL) ×3
ELECTRODE BLDE 4.0 EZ CLN MEGD (MISCELLANEOUS) IMPLANT
ELECTRODE REM PT RTRN 9FT ADLT (ELECTROSURGICAL) ×1 IMPLANT
EVACUATOR 3/16  PVC DRAIN (DRAIN) ×2
EVACUATOR 3/16 PVC DRAIN (DRAIN) ×1 IMPLANT
FIBER BOAT ALLO 50X25X7 5CC (Bone Implant) ×2 IMPLANT
GAUZE 4X4 16PLY RFD (DISPOSABLE) IMPLANT
GAUZE SPONGE 4X4 12PLY STRL (GAUZE/BANDAGES/DRESSINGS) ×3 IMPLANT
GLOVE BIO SURGEON STRL SZ7 (GLOVE) IMPLANT
GLOVE BIO SURGEON STRL SZ8 (GLOVE) ×6 IMPLANT
GLOVE BIOGEL PI IND STRL 7.0 (GLOVE) IMPLANT
GLOVE BIOGEL PI IND STRL 7.5 (GLOVE) IMPLANT
GLOVE BIOGEL PI INDICATOR 7.0 (GLOVE) ×4
GLOVE BIOGEL PI INDICATOR 7.5 (GLOVE) ×2
GLOVE EXAM NITRILE XL STR (GLOVE) IMPLANT
GLOVE INDICATOR 8.5 STRL (GLOVE) ×6 IMPLANT
GLOVE SURG SS PI 7.5 STRL IVOR (GLOVE) ×10 IMPLANT
GLOVE SURG SS PI 8.0 STRL IVOR (GLOVE) ×2 IMPLANT
GOWN STRL REUS W/ TWL LRG LVL3 (GOWN DISPOSABLE) IMPLANT
GOWN STRL REUS W/ TWL XL LVL3 (GOWN DISPOSABLE) ×2 IMPLANT
GOWN STRL REUS W/TWL 2XL LVL3 (GOWN DISPOSABLE) IMPLANT
GOWN STRL REUS W/TWL LRG LVL3 (GOWN DISPOSABLE)
GOWN STRL REUS W/TWL XL LVL3 (GOWN DISPOSABLE) ×12
GRAFT BNE MATRIX VG FRMBL L 10 (Bone Implant) IMPLANT
HEMOSTAT POWDER KIT SURGIFOAM (HEMOSTASIS) IMPLANT
KIT BASIN OR (CUSTOM PROCEDURE TRAY) ×3 IMPLANT
KIT TURNOVER KIT B (KITS) ×3 IMPLANT
MILL MEDIUM DISP (BLADE) ×3 IMPLANT
NDL HYPO 21X1.5 SAFETY (NEEDLE) ×1 IMPLANT
NDL HYPO 25X1 1.5 SAFETY (NEEDLE) ×1 IMPLANT
NEEDLE HYPO 21X1.5 SAFETY (NEEDLE) ×3 IMPLANT
NEEDLE HYPO 25X1 1.5 SAFETY (NEEDLE) ×3 IMPLANT
NS IRRIG 1000ML POUR BTL (IV SOLUTION) ×3 IMPLANT
OIL CARTRIDGE MAESTRO DRILL (MISCELLANEOUS) ×3
PACK LAMINECTOMY NEURO (CUSTOM PROCEDURE TRAY) ×3 IMPLANT
PAD ARMBOARD 7.5X6 YLW CONV (MISCELLANEOUS) ×3 IMPLANT
ROD 75MM SPINAL (Rod) ×4 IMPLANT
SCREW AMP MODULAR CREO 6.5X45 (Screw) ×4 IMPLANT
SPONGE LAP 4X18 RFD (DISPOSABLE) IMPLANT
SPONGE SURGIFOAM ABS GEL 100 (HEMOSTASIS) ×3 IMPLANT
STRIP CLOSURE SKIN 1/2X4 (GAUZE/BANDAGES/DRESSINGS) ×3 IMPLANT
SUT VIC AB 0 CT1 18XCR BRD8 (SUTURE) ×2 IMPLANT
SUT VIC AB 0 CT1 8-18 (SUTURE) ×6
SUT VIC AB 2-0 CT1 18 (SUTURE) ×3 IMPLANT
SUT VIC AB 4-0 PS2 27 (SUTURE) ×3 IMPLANT
SYR 20ML LL LF (SYRINGE) ×2 IMPLANT
TOWEL GREEN STERILE (TOWEL DISPOSABLE) ×3 IMPLANT
TOWEL GREEN STERILE FF (TOWEL DISPOSABLE) ×3 IMPLANT
TRAY FOLEY MTR SLVR 16FR STAT (SET/KITS/TRAYS/PACK) ×3 IMPLANT
TULIP CREP AMP 5.5MM (Orthopedic Implant) ×4 IMPLANT
WATER STERILE IRR 1000ML POUR (IV SOLUTION) ×3 IMPLANT

## 2019-01-31 NOTE — Transfer of Care (Signed)
Immediate Anesthesia Transfer of Care Note  Patient: Angie Olson  Procedure(s) Performed: Posterior Lumbar Interbody Fusion  - Lumbar three-Lumbar four with exploration fusion removal and replacement of hardware Lumbar two-lumbar three - Posterior Lateral - Interbody Fusion (N/A Back)  Patient Location: PACU  Anesthesia Type:General  Level of Consciousness: awake, alert  and oriented  Airway & Oxygen Therapy: Patient Spontanous Breathing and Patient connected to face mask oxygen  Post-op Assessment: Report given to RN and Post -op Vital signs reviewed and stable  Post vital signs: Reviewed and stable  Last Vitals:  Vitals Value Taken Time  BP 138/75 01/31/19 1353  Temp    Pulse 104 01/31/19 1354  Resp 12 01/31/19 1354  SpO2 100 % 01/31/19 1354  Vitals shown include unvalidated device data.  Last Pain: There were no vitals filed for this visit.       Complications: No apparent anesthesia complications

## 2019-01-31 NOTE — Anesthesia Postprocedure Evaluation (Signed)
Anesthesia Post Note  Patient: Angie Olson  Procedure(s) Performed: Posterior Lumbar Interbody Fusion  - Lumbar three-Lumbar four with exploration fusion removal and replacement of hardware Lumbar two-lumbar three - Posterior Lateral - Interbody Fusion (N/A Back)     Patient location during evaluation: PACU Anesthesia Type: General Level of consciousness: awake and alert Pain management: pain level controlled Vital Signs Assessment: post-procedure vital signs reviewed and stable Respiratory status: spontaneous breathing, nonlabored ventilation, respiratory function stable and patient connected to nasal cannula oxygen Cardiovascular status: blood pressure returned to baseline and stable Postop Assessment: no apparent nausea or vomiting Anesthetic complications: no    Last Vitals:  Vitals:   01/31/19 1425 01/31/19 1509  BP: 124/68 140/75  Pulse: (!) 104 (!) 102  Resp: 20 18  Temp: 36.9 C 36.6 C  SpO2: 96% 95%    Last Pain:  Vitals:   01/31/19 1509  TempSrc: Oral  PainSc:                  Effie Berkshire

## 2019-01-31 NOTE — Op Note (Signed)
Preoperative diagnosis: Relative instability L3-4 as well as fractured bilateral L3 pedicles with displacement of bilateral L3 cortical screws with instability L2-3 L3-4  Postoperative diagnosis: Same  Procedure: #1 reexploration fusion removal of hardware L2-3 with removal of bilateral L3 cortical screws which had fractured the pedicle and were loose with redo decompressive laminotomies at L2-3  2.  Left-sided decompressive laminectomy and transforaminal interbody fusion L3-4 utilizing the globus Altera cage packed with locally harvested autograft mixed with vivigen  3.  Placement bilateral L4 pedicle screws and segmental fixation L2-L4 utilizing previously placed L2 cortical screws without replacement of L3 screw secondary to fracture pedicle.  4.  Posterior lateral arthrodesis L2-L4 utilizing locally harvested autograft  Surgeon: Dominica Severin Collan Schoenfeld  Assistant: Nash Shearer  Anesthesia: General  EBL: Minimal  HPI: Patient is [age]-year-old female who 2 weeks ago underwent L2-3 posterior lumbar interbody fusion and after she went home so experiencing severe back and bilateral leg pain both right leg and left leg work-up MRI scan showed with plain films of some degenerative collapse scoliotic deformity kyphosis at L3-4.  Patient was admitted for reexploration fusion and extension however preoperative CT scan showed bilateral fractures of both L3 pedicles and displacement of both L3 cortical screws.  So extensively went over the risks and benefits of revision surgery as well as new interbody fusion at L3-4 with her as well as perioperative course expectations of outcome and alternatives of surgery and she understood and agreed to proceed forward.  Operative procedure: Patient brought into the OR was due to general anesthesia positioned prone Wilson frame her back was prepped and draped in routine sterile fashion.  Roll incision was opened up and extended slightly lateral scar tissue was dissected free  and subperiosteal dissection was carried lamina of L3-4 exposing the TPs at L4 and the previously placed pedicle holes and entry points at L4.  I then identified the previous construct at L2-3 disconnected it and removed completely loose L3 cortical screws.  I then performed a redo laminotomy at L2-3 to inspect the nerve roots I tracked both L2 nerve roots out the foramen inspected the space there was an extensive amount of inflammatory granulation tissue around the L2 and L3 nerve root especially on the right and this was all teased away with a nerve hook dental dissector infected the cages they were all intact and in good position.  I extended the laminotomy out both the 2 foramen in the 3 foramen and then extended on the patient's left side and performed a complete decompressive laminectomy with complete medial facetectomy at L3-4.  Removed some additional disc material superior to the disc base at L2-3 as well as completely cleaned out the disc on the left at L3-4.  Utilizing rasps Epstein curettes paddle shavers I did complete discectomy from the left side across the midline sized up a 9 to 12 mm 8 degree Altera cage and directed it across the midline kicked in place after I extensively packed with autograft mix and extensively packed autograft mixed both anteriorly cage and lateral.  After confirmed the case to be in good position under fluoroscopy I replaced 2 new pedicle screws at L4 all screws excellent purchase.  I was not able to utilize any residual pedicle to put a screw into the L3 secondary to the fractures.  So then I selected a 75 mm rod contoured it anchored everything in place and compressed the L2 cortical screw against the L3 pedicle screw.  The wound was copes irrigated  to [place] states was maintained I aggressively decorticated the lateral facet joints and TPs from L2 down to L4 I have left the lamina on the right and facet joint on the right at L3-4 so I decorticated this aggressively and  then the autograft mix was packed posterior laterally along the TPs along the right-sided lamina and facet joint at 3 4.  Then inspected all the foramina to confirm patency leg Gelfoam of the dura injected Exparel in the fascia placed a medium Hemovac drain closed wound in layers with empty Vicryl in a running 4 subcuticular Dermabond benzoin Steri-Strips and a sterile dressing was applied patient recovery room in stable condition.  At the end the case on needle count sponge counts were correct.  My assistant Joelene Millin was present during the entire case with help with neural retraction and placement of new screws and cage.

## 2019-01-31 NOTE — H&P (Signed)
Angie Olson is an 68 y.o. female.   Chief Complaint: Back and left leg pain HPI: 68 year old female who 2 weeks ago underwent decompressive laminectomy and fusion at L2-3 initially did well postoperatively however started experiencing severe left hip pain as well as increased numbness and pain going down her right leg.  Work-up with plain films and MRI scan showed degenerative collapse and scoliotic deformity that is developed on the level below her fusion acutely with kyphosis collapse on the left side reflecting underlying instability this was the intervening level between 2 previous fusion construct.  Central canal appeared to be patent it is possible that the left L3 screw also has migrated lateral from the vertebral body.  So we will be obtaining an abdominal pelvic CT to make sure that the left lower quadrant hip pain is related to her spine and not a retroperitoneal hematoma or intrinsic hip pathology.  And then proceed forward with reexploration of L2-3 fusion with L3-4 posterior lumbar decompression and interbody fusion.  I have extensively gone over the risks and benefits of the procedure with her as well as perioperative course expectations of outcome and alternatives of surgery and she understood and agreed to proceed forward.  Past Medical History:  Diagnosis Date  . Acid reflux   . Allergic rhinitis   . anesthetic complication    Respiratory arrest 2019-02-13  . Anxiety    PT'S SON DIED 06-13-10  . Arthritis    RA AND OA  . Borderline hypercholesterolemia    on medication for TIA  . Bronchitis    CHRONIC   . Cancer ([place])    melanoma - face   . Colon polyp   . Environmental allergies   . GERD (gastroesophageal reflux disease)    on PPI and carafte prn  . H/O hiatal hernia   . History of kidney stones   . Hypertension   . IBS (irritable bowel syndrome)   . LBP (low back pain)   . OA (osteoarthritis)    Left Shoulder  . Pain    RIGHT KNEE - TORN MENISCUS AND ACL  .  Pneumonia    X 3  - LAST TIME WAS 2011-06-13  . PONV (postoperative nausea and vomiting) 09-2012   sore throat after ankle surgery 6 yrs ago and severe ponv 09-2012  . Pre-diabetes   . Pulmonary nodule seen on imaging study   . Shortness of breath    with activity,bronchitis  . Stroke ([place]) 03/2011   TIA--PT EXPERIENCED NUMBNESS RT HAND AND ARM , HEADACHE AND NAUSEA. NO RESIDUAL PROBLEMS    Past Surgical History:  Procedure Laterality Date  . ANKLE SURGERY  ~10 years ago   left  . BACK SURGERY  2009/06/12   lower  . CHOLECYSTECTOMY  ~15 years ago  . COLONOSCOPY  [id number], Jun 13, 2011  . HARDWARE REMOVAL N/A 04/07/2016   Procedure: HARDWARE REMOVAL;  Surgeon: Kary Kos, MD;  Location: [facility];  Service: Neurosurgery;  Laterality: N/A;  . HARVEST BONE GRAFT N/A 07/05/2014   Procedure: HARVEST ILIAC BONE GRAFT;  Surgeon: Kary Kos, MD;  Location: [facility];  Service: Neurosurgery;  Laterality: N/A;  . KNEE ARTHROSCOPY Right 10/13/2012   Procedure: RIGHT KNEE ARTHROSCOPY WITH DEBRIDEMENT, CHONDROPLASTY;  Surgeon: Gearlean Alf, MD;  Location: [facility];  Service: Orthopedics;  Laterality: Right;  . KNEE ARTHROSCOPY Right 03/02/2013   Procedure: RIGHT ARTHROSCOPY KNEE WITH MEDIAL MENISCAL  DEBRIDEMENT;  Surgeon: Gearlean Alf, MD;  Location: [facility];  Service: Orthopedics;  Laterality: Right;  . LUMBAR FUSION  07/05/2014  . LUMBAR WOUND DEBRIDEMENT N/A 01/11/2014   Procedure: Irrigation and Debridement Lumbar Wound for hematoma;  Surgeon: Elaina Hoops, MD;  Location: [facility];  Service: Neurosurgery;  Laterality: N/A;  . MAXIMUM ACCESS (MAS)POSTERIOR LUMBAR INTERBODY FUSION (PLIF) 1 LEVEL N/A 12/05/2013   Procedure: FOR MAXIMUM ACCESS (MAS) POSTERIOR LUMBAR INTERBODY FUSION (PLIF)LUMBAR FIVE-SACRAL-ONE,REMOVAL HARDWARE LUMBAR FOUR-FIVE;  Surgeon: Elaina Hoops, MD;  Location: [facility];  Service: Neurosurgery;  Laterality: N/A;  . Mohr's procedure  02/2015   face  . Right Hip arthroplasty     10/01/16 Dr.  Wynelle Link  . ROOT CANAL    . SHOULDER SURGERY Left ~14 years ago   "scope"  . thumb surgery Left ~14 years ago   x2  . TOTAL HIP ARTHROPLASTY Left 04/25/2015   Procedure: LEFT TOTAL HIP ARTHROPLASTY ANTERIOR APPROACH;  Surgeon: Gaynelle Arabian, MD;  Location: [facility];  Service: Orthopedics;  Laterality: Left;  . TOTAL HIP ARTHROPLASTY Right 10/01/2016   Procedure: RIGHT TOTAL HIP ARTHROPLASTY ANTERIOR APPROACH;  Surgeon: Gaynelle Arabian, MD;  Location: [facility];  Service: Orthopedics;  Laterality: Right;  . TOTAL KNEE ARTHROPLASTY Right 07/25/2013   Procedure: RIGHT TOTAL KNEE ARTHROPLASTY;  Surgeon: Gearlean Alf, MD;  Location: [facility];  Service: Orthopedics;  Laterality: Right;  . TOTAL KNEE ARTHROPLASTY WITH REVISION COMPONENTS Right 05/28/2016   Procedure: RIGHT TIBIAL POLYETHYLENE KNEE REVISION;  Surgeon: Gaynelle Arabian, MD;  Location: [facility];  Service: Orthopedics;  Laterality: Right;  requests 76mins  . TRIGGER FINGER RELEASE  ~6 years ago   X2 ON RIGHT HAND  . TUBAL LIGATION      Family History  Problem Relation Age of Onset  . Heart disease Sister   . Lung cancer Brother   . Heart disease Brother   . CAD Brother   . Heart attack Mother   . Emphysema Father   . Hypertension Daughter   . Colon cancer Neg Hx    Social History:  reports that she has never smoked. She has never used smokeless tobacco. She reports that she does not drink alcohol or use drugs.  Allergies:  Allergies  Allergen Reactions  . Methotrexate Derivatives Other (See Comments)    Causes recurrent infections  . Pseudoephedrine Other (See Comments)    Makes jittery   . Reglan [Metoclopramide] Other (See Comments)    insomnia  . Vicodin [Hydrocodone-Acetaminophen] Other (See Comments)    "Makes me feel weird"  . Oxycodone Itching and Rash    Patient tolerated hydromorphone without reaction.    Medications Prior to Admission  Medication Sig Dispense Refill  . acetaminophen (TYLENOL 8 HOUR ARTHRITIS PAIN) 650  MG CR tablet Take 1,300 mg by mouth every 8 (eight) hours as needed for pain.    Marland Kitchen ALPRAZolam (XANAX) 1 MG tablet Take 1 mg by mouth at bedtime.     Marland Kitchen amLODipine (NORVASC) 5 MG tablet Take 5 mg by mouth daily.     Marland Kitchen atorvastatin (LIPITOR) 20 MG tablet Take 20 mg by mouth daily.     . cetirizine (ZYRTEC) 10 MG tablet Take 10 mg by mouth daily.    . Cholecalciferol (VITAMIN D3) 1.25 MG (50000 UT) CAPS Take 50,000 Units by mouth every Sunday.    . esomeprazole (NEXIUM) 20 MG capsule Take 20 mg by mouth daily before breakfast.     . gabapentin (NEURONTIN) 300 MG capsule Take 600 mg by mouth 3 (  three) times daily.     Marland Kitchen HYDROmorphone (DILAUDID) 4 MG tablet Take 4 mg by mouth every 4 (four) hours as needed for moderate pain.     . hydroxychloroquine (PLAQUENIL) 200 MG tablet Take 200 mg by mouth 2 (two) times daily.    Marland Kitchen leflunomide (ARAVA) 20 MG tablet Take 20 mg by mouth daily.    Marland Kitchen lisinopril (PRINIVIL,ZESTRIL) 40 MG tablet Take 40 mg by mouth daily.    . methocarbamol (ROBAXIN) 750 MG tablet Take 750 mg by mouth every 4 (four) hours.    . methylPREDNISolone (MEDROL DOSEPAK) 4 MG TBPK tablet Take 4 mg by mouth daily.    . nortriptyline (PAMELOR) 25 MG capsule Take 25 mg by mouth at bedtime.   2  . predniSONE (DELTASONE) 5 MG tablet Take 5 mg by mouth daily. Continuous for rheumatoid arthritis    . sucralfate (CARAFATE) 1 G tablet Take 1 g by mouth 3 (three) times daily as needed (indigestion).       Results for orders placed or performed during the hospital encounter of 01/31/19 (from the past 48 hour(s))  CBC     Status: Abnormal   Collection Time: 01/31/19  7:34 AM  Result Value Ref Range   WBC 8.5 4.0 - 10.5 K/uL   RBC 3.76 (L) 3.87 - 5.11 MIL/uL   Hemoglobin 10.1 (L) 12.0 - 15.0 g/dL   HCT 33.4 (L) 36.0 - 46.0 %   MCV 88.8 80.0 - 100.0 fL   MCH 26.9 26.0 - 34.0 pg   MCHC 30.2 30.0 - 36.0 g/dL   RDW 13.8 11.5 - 15.5 %   Platelets 345 150 - 400 K/uL   nRBC 0.0 0.0 - 0.2 %    Comment:  Performed at [facility], [place] [address] [place], [place] [id number]  Basic metabolic panel     Status: Abnormal   Collection Time: 01/31/19  7:34 AM  Result Value Ref Range   Sodium 140 135 - 145 mmol/L   Potassium 4.0 3.5 - 5.1 mmol/L   Chloride 101 98 - 111 mmol/L   CO2 27 22 - 32 mmol/L   Glucose, Bld 113 (H) 70 - 99 mg/dL   BUN 14 8 - 23 mg/dL   Creatinine, Ser 0.71 0.44 - 1.00 mg/dL   Calcium 8.6 (L) 8.9 - 10.3 mg/dL   GFR calc non Af Amer >60 >60 mL/min   GFR calc Af Amer >60 >60 mL/min   Anion gap 12 5 - 15    Comment: Performed at [facility], [place] [address] [place], [place] [address]  Type and screen [facility]     Status: None   Collection Time: 01/31/19  8:00 AM  Result Value Ref Range   ABO/RH(D) A NEG    Antibody Screen NEG    Sample Expiration      02/03/2019,2359 Performed at [facility], [place] [address], [place], [place] [address]    No results found.  Review of Systems  Musculoskeletal: Positive for back pain and joint pain.  Neurological: Positive for focal weakness.    Blood pressure (!) 152/68, pulse (!) 101, temperature 98.3 F (36.8 C), height 5\' 5"  (1.651 m), weight 73.9 kg, SpO2 98 %. Physical Exam  Constitutional: She is oriented to person, place, and time. She appears well-developed.  HENT:  Head: Normocephalic.  Eyes: Pupils are equal, round, and reactive to light.  Neck: Normal range of motion.  Cardiovascular: Normal  rate.  Respiratory: Effort normal.  GI: Soft.  Neurological: She is alert and oriented to person, place, and time. She has normal strength. GCS eye subscore is 4. GCS verbal subscore is 5. GCS motor subscore is 6.  Patient is awake and alert strength is 5 out of 5 with some pain limitation iliopsoas quads hamstrings gastrocs and EHL bilaterally  Skin: Skin is warm and dry.     Assessment/Plan 39-year female presents for reexploration of L2-3 fusion and L3-4 decompression and interbody  fusion.  Foxx Klarich P, MD 01/31/2019, 9:42 AM

## 2019-01-31 NOTE — Anesthesia Procedure Notes (Signed)
Procedure Name: Intubation Date/Time: 01/31/2019 11:12 AM Performed by: Teressa Lower., CRNA Pre-anesthesia Checklist: Patient identified, Emergency Drugs available, Suction available and Patient being monitored Patient Re-evaluated:Patient Re-evaluated prior to induction Oxygen Delivery Method: Circle system utilized Preoxygenation: Pre-oxygenation with 100% oxygen Induction Type: IV induction and Cricoid Pressure applied Ventilation: Mask ventilation without difficulty Laryngoscope Size: Mac and 3 Grade View: Grade I Tube type: Oral Tube size: 7.0 mm Number of attempts: 1 Airway Equipment and Method: Stylet Placement Confirmation: ETT inserted through vocal cords under direct vision,  positive ETCO2 and breath sounds checked- equal and bilateral Secured at: 21 cm Tube secured with: Tape Dental Injury: Teeth and Oropharynx as per pre-operative assessment  Comments: First intubation attempt by paramedic student.

## 2019-02-01 NOTE — Evaluation (Signed)
Physical Therapy Evaluation Patient Details Name: Angie Olson MRN: [id number] DOB: Sep 13, 1950 Today's Date: 02/01/2019   History of Present Illness  Pt is 68 yo female presenting after prior laminectomy and fusion of L2-3 2 weeks ago with increading severe L hip pain as well as pain and numbness down R leg. MRI revealed degenerative collapse and scoliotic deformity the level below the fusion, and left L3 screw migrated lateral from vertebral body. Pt is s/p reexploration of L2-3 fusion with L3-4 posterior lumbar decompression and interbody fusion. PMH significant for CVA/TIA, SOB, OA, HTN, CA, R TKR, bilateral THR, prior back surgeries 2011/2015/2016, L ankle surgery 2010.  Clinical Impression  Pt admitted with above diagnosis. At the time of PT eval, pt was able to demonstrate transfers and ambulation with gross min guard assist and RW for support. Overall pt moving slow and demonstrating decreased tolerance for functional activity. Eyes closed for most of session even during gait training. Pt was educated on precautions, brace application/wearing schedule (not available during session but pt reports she is getting a new one - delivered today?), appropriate activity progression, and car transfer. Pt currently with functional limitations due to the deficits listed below (see PT Problem List). Pt will benefit from skilled PT to increase their independence and safety with mobility to allow discharge to the venue listed below.      Follow Up Recommendations No PT follow up;Supervision for mobility/OOB    Equipment Recommendations  Rolling walker with 5" wheels    Recommendations for Other Services       Precautions / Restrictions Precautions Precautions: Fall;Back Precaution Booklet Issued: Yes (comment) Precaution Comments: Reviewed handout and pt was cued for precautions during functional mobility.  Required Braces or Orthoses: Spinal Brace Spinal Brace: Lumbar corset(Not present at time  of PT eval) Restrictions Weight Bearing Restrictions: No      Mobility  Bed Mobility Overal bed mobility: Needs Assistance Bed Mobility: Sit to Sidelying;Rolling Rolling: Supervision Sidelying to sit: Supervision     Sit to sidelying: Min guard General bed mobility comments: Close supervision for safety as pt transitioned to EOB. Heavy use of rails required.   Transfers Overall transfer level: Needs assistance Equipment used: Rolling walker (2 wheeled) Transfers: Sit to/from Stand Sit to Stand: Min guard         General transfer comment: Hands-on guarding for balance support and safety as pt powered up to full standing position. Uncontrolled descent to bed after gait training due to pain.   Ambulation/Gait Ambulation/Gait assistance: Min guard Gait Distance (Feet): 175 Feet Assistive device: Rolling walker (2 wheeled) Gait Pattern/deviations: Step-through pattern;Decreased stride length;Trunk flexed Gait velocity: Decreased Gait velocity interpretation: <1.8 ft/sec, indicate of risk for recurrent falls General Gait Details: VC's for improved posture, closer walker proximity, and forward gaze. No assist required however hands-on guarding provided throughout gait training for safety.   Stairs            Wheelchair Mobility    Modified Rankin (Stroke Patients Only)       Balance Overall balance assessment: Mild deficits observed, not formally tested Sitting-balance support: Feet supported;No upper extremity supported Sitting balance-Leahy Scale: Fair     Standing balance support: Bilateral upper extremity supported;During functional activity Standing balance-Leahy Scale: Poor Standing balance comment: Reliant on UE support on walker during OOB mobility.                              Pertinent  Vitals/Pain Pain Assessment: Faces Faces Pain Scale: Hurts worst Pain Location: Low back Pain Descriptors / Indicators:  Grimacing;Guarding;Moaning;Operative site guarding Pain Intervention(s): Limited activity within patient's tolerance;Monitored during session;Repositioned    Home Living Family/patient expects to be discharged to:: Private residence Living Arrangements: Spouse/significant other Available Help at Discharge: Family;Available 24 hours/day Type of Home: House Home Access: Stairs to enter Entrance Stairs-Rails: Left Entrance Stairs-Number of Steps: 3 Home Layout: Multi-level;Able to live on main level with bedroom/bathroom Home Equipment: Gilford Rile - 2 wheels;Walker - 4 wheels;Bedside commode;Shower seat;Cane - single point      Prior Function Level of Independence: Independent with assistive device(s)         Comments: RW for functional mobility. Pt has been the primary caregiver for her husband who has Parkinson's Disease and dementia. She reports having to physically assist him and do all of his ADL's. Pt's daughter will be staying with them until further notice to provide care for pt's husband and pt as needed.     Hand Dominance   Dominant Hand: Right    Extremity/Trunk Assessment   Upper Extremity Assessment Upper Extremity Assessment: Defer to OT evaluation    Lower Extremity Assessment Lower Extremity Assessment: Generalized weakness    Cervical / Trunk Assessment Cervical / Trunk Assessment: Other exceptions Cervical / Trunk Exceptions: s/p surgery  Communication   Communication: No difficulties  Cognition Arousal/Alertness: Lethargic Behavior During Therapy: WFL for tasks assessed/performed Overall Cognitive Status: Within Functional Limits for tasks assessed                                 General Comments: Holding eyes closed for a majority of the session      General Comments General comments (skin integrity, edema, etc.): Pt in significant pain during session. RN notified, limited tx session within pt's tolerance    Exercises      Assessment/Plan    PT Assessment Patient needs continued PT services  PT Problem List Decreased strength;Decreased activity tolerance;Decreased balance;Decreased mobility;Decreased knowledge of use of DME;Decreased knowledge of precautions;Decreased safety awareness;Pain       PT Treatment Interventions DME instruction;Gait training;Stair training;Functional mobility training;Therapeutic activities;Therapeutic exercise;Neuromuscular re-education;Patient/family education    PT Goals (Current goals can be found in the Care Plan section)  Acute Rehab PT Goals Patient Stated Goal: Home today PT Goal Formulation: With patient Time For Goal Achievement: 02/15/19 Potential to Achieve Goals: Good    Frequency Min 5X/week   Barriers to discharge        Co-evaluation               AM-PAC PT "6 Clicks" Mobility  Outcome Measure Help needed turning from your back to your side while in a flat bed without using bedrails?: None Help needed moving from lying on your back to sitting on the side of a flat bed without using bedrails?: None Help needed moving to and from a bed to a chair (including a wheelchair)?: A Little Help needed standing up from a chair using your arms (e.g., wheelchair or bedside chair)?: A Little Help needed to walk in hospital room?: A Little Help needed climbing 3-5 steps with a railing? : A Little 6 Click Score: 20    End of Session Equipment Utilized During Treatment: Gait belt Activity Tolerance: Patient tolerated treatment well Patient left: in bed;with call bell/phone within reach Nurse Communication: Mobility status PT Visit Diagnosis: Unsteadiness on feet (R26.81);Pain Pain -  part of body: (back)    Time: [id number] PT Time Calculation (min) (ACUTE ONLY): 20 min   Charges:   PT Evaluation $PT Eval Moderate Complexity: 1 Mod          Rolinda Roan, PT, DPT Acute Rehabilitation Services Pager: [phone] Office: [phone]   Thelma Comp 02/01/2019, 11:03 AM

## 2019-02-01 NOTE — Progress Notes (Signed)
Occupational Therapy Evaluation Patient Details Name: Angie Olson MRN: [id number] DOB: [birth year] Today's Date: 02/01/2019    History of Present Illness Pt is 68 yo female presenting after prior laminectomy and fusion of L2-3 2 weeks ago with increading severe L hip pain as well as pain and numbness down R leg. MRI revealed degenerative collapse and scoliotic deformity the level below the fusion, and left L3 screw migrated lateral from vertebral body. Pt is s/p reexploration of L2-3 fusion with L3-4 posterior lumbar decompression and interbody fusion. PMH significant for CVA/TIA, SOB, OA, HTN, CA, R TKR, bilateral THR, prior back surgeries 2011/2015/2016, L ankle surgery 2010.   Clinical Impression   PTA, pt was using RW for functional mobility and was independent for BADLs. Pt currently presents with decreased strength, activity tolerance, balance, knowledge of precautions, and significant pain. Provided education regarding compensatory techniques for AE for LB ADLs. Pt performed LB dressing with AE with min A for safety and to power up to stand. Pt pain limiting participation in treatment session. Pt would benefit from continued OT acutely to address deficits and facilitate safe dc. Recommend dc with HHOT to increase safe performance of ADLs.    Follow Up Recommendations  Home health OT;Supervision/Assistance - 24 hour    Equipment Recommendations  None recommended by OT(Pt has all equipment needed)    Recommendations for Other Services PT consult     Precautions / Restrictions Precautions Precautions: Fall;Back Precaution Booklet Issued: Yes (comment) Precaution Comments: Reviewed handout and pt was cued for precautions during functional mobility.  Required Braces or Orthoses: Spinal Brace Spinal Brace: Lumbar corset Restrictions Weight Bearing Restrictions: No      Mobility Bed Mobility Overal bed mobility: Needs Assistance Bed Mobility: Sit to Sidelying;Rolling Rolling:  Min guard       Sit to sidelying: Min guard General bed mobility comments: Min guard A for safety and to maintain precautions  Transfers Overall transfer level: Needs assistance Equipment used: Rolling walker (2 wheeled) Transfers: Sit to/from Stand Sit to Stand: Min assist         General transfer comment: Pt required min A to power up to stand due to increased pain    Balance Overall balance assessment: Mild deficits observed, not formally tested                                         ADL either performed or assessed with clinical judgement   ADL Overall ADL's : Needs assistance/impaired Eating/Feeding: Supervision/ safety;Sitting   Grooming: Supervision/safety;Sitting   Upper Body Bathing: Min guard;Sitting   Lower Body Bathing: Minimal assistance;Sit to/from stand   Upper Body Dressing : Min guard;Sitting   Lower Body Dressing: Minimal assistance;Sit to/from stand Lower Body Dressing Details (indicate cue type and reason): Provided education on AE for LB ADLs. Pt performed LB dressing with AE with min A to power up to stand Toilet Transfer: Minimal assistance;RW;Ambulation;BSC   Toileting- Clothing Manipulation and Hygiene: Minimal assistance;Sit to/from stand       Functional mobility during ADLs: Min guard;Rolling walker General ADL Comments: Provided education regarding AE for LB ADLs and compensatory strategies for ADLs. Pt performed LB dressing with AE with min A to power up to stand. Pt pain limiting participation in session.     Vision Baseline Vision/History: Wears glasses Wears Glasses: At all times Patient Visual Report: No change from baseline  Perception     Praxis      Pertinent Vitals/Pain Pain Assessment: Faces Faces Pain Scale: Hurts worst Pain Location: Low back Pain Descriptors / Indicators: Aching;Crying;Discomfort;Grimacing;Guarding;Moaning;Shooting;Sore Pain Intervention(s): Limited activity within  patient's tolerance;Monitored during session;Repositioned;Patient requesting pain meds-RN notified;Utilized relaxation techniques     Hand Dominance Right   Extremity/Trunk Assessment Upper Extremity Assessment Upper Extremity Assessment: Overall WFL for tasks assessed   Lower Extremity Assessment Lower Extremity Assessment: Defer to PT evaluation   Cervical / Trunk Assessment Cervical / Trunk Assessment: Other exceptions Cervical / Trunk Exceptions: s/p surgery   Communication Communication Communication: No difficulties   Cognition Arousal/Alertness: Awake/alert Behavior During Therapy: WFL for tasks assessed/performed Overall Cognitive Status: Within Functional Limits for tasks assessed                                 General Comments: Pt in significant pain, cognition appeared [facility]   General Comments  Pt in significant pain during session. RN notified, limited tx session within pt's tolerance    Exercises     Shoulder Instructions      Home Living Family/patient expects to be discharged to:: Private residence Living Arrangements: Spouse/significant other Available Help at Discharge: Family;Available 24 hours/day Type of Home: House Home Access: Stairs to enter [employer] of Steps: 3 Entrance Stairs-Rails: Left Home Layout: Multi-level;Able to live on main level with bedroom/bathroom     Bathroom Shower/Tub: Walk-in shower   Bathroom Toilet: Handicapped height     Home Equipment: Environmental consultant - 2 wheels;Walker - 4 wheels;Bedside commode;Shower seat;Cane - single point          Prior Functioning/Environment Level of Independence: Independent with assistive device(s)        Comments: Pt reports independence prior to initial surgery, would occasionally use her husbands RW for functional mobility. Since second surgery, pt used RW for functional mobility. Pt has been the primary caregiver for her husband who has Parkinson's Disease and  dementia. She reports having to physically assist him and do all of his ADL's. Pt's daughter will be staying with them until further notice to provide care for pt's husband and pt as needed.        OT Problem List: Decreased strength;Decreased range of motion;Decreased activity tolerance;Impaired balance (sitting and/or standing);Decreased safety awareness;Decreased knowledge of use of DME or AE;Decreased knowledge of precautions;Pain      OT Treatment/Interventions: Self-care/ADL training;DME and/or AE instruction;Therapeutic activities;Patient/family education;Balance training    OT Goals(Current goals can be found in the care plan section) Acute Rehab OT Goals Patient Stated Goal: Home today OT Goal Formulation: With patient Time For Goal Achievement: 02/15/19 Potential to Achieve Goals: Good  OT Frequency: Min 3X/week   Barriers to D/C:            Co-evaluation              AM-PAC OT "6 Clicks" Daily Activity     Outcome Measure Help from another person eating meals?: None Help from another person taking care of personal grooming?: None Help from another person toileting, which includes using toliet, bedpan, or urinal?: A Little Help from another person bathing (including washing, rinsing, drying)?: A Little Help from another person to put on and taking off regular upper body clothing?: None Help from another person to put on and taking off regular lower body clothing?: A Little 6 Click Score: 21   End of Session Equipment Utilized During Treatment:  Rolling walker Nurse Communication: Mobility status;Patient requests pain meds  Activity Tolerance: Patient limited by pain Patient left: in bed;with call bell/phone within reach;with nursing/sitter in room  OT Visit Diagnosis: Unsteadiness on feet (R26.81);Muscle weakness (generalized) (M62.81);Pain Pain - part of body: (low back)                Time: [id number] OT Time Calculation (min): 18 min Charges:  OT General  Charges $OT Visit: 1 Visit OT Evaluation $OT Eval Moderate Complexity: 1 Mod  Gus Rankin, OT Student  Gus Rankin 02/01/2019, 10:39 AM

## 2019-02-01 NOTE — Progress Notes (Signed)
Orthopedic Tech Progress Note Patient Details:  Angie Olson [birth year] [id number] Called in order to [facility] for a aspen lumbar/ corsett Patient ID: Angie Olson, female   DOB: [birth year], 68 y.o.   MRN: [id number]   [name] 02/01/2019, 9:05 AM

## 2019-02-02 ENCOUNTER — Inpatient Hospital Stay (HOSPITAL_COMMUNITY): Payer: Medicare Other

## 2019-02-02 MED ORDER — MAGNESIUM CITRATE PO SOLN
1.0000 | Freq: Once | ORAL | Status: AC
  Administered 2019-02-02: 1 via ORAL
  Filled 2019-02-02: qty 296

## 2019-02-02 NOTE — Progress Notes (Signed)
Physical Therapy Treatment Patient Details Name: Angie Olson MRN: [id number] DOB: 07/07/1950 Today's Date: 02/02/2019    History of Present Illness Pt is 68 yo female presenting after prior laminectomy and fusion of L2-3 2 weeks ago with increading severe L hip pain as well as pain and numbness down R leg. MRI revealed degenerative collapse and scoliotic deformity the level below the fusion, and left L3 screw migrated lateral from vertebral body. Pt is s/p reexploration of L2-3 fusion with L3-4 posterior lumbar decompression and interbody fusion. PMH significant for CVA/TIA, SOB, OA, HTN, CA, R TKR, bilateral THR, prior back surgeries 2011/2015/2016, L ankle surgery 2010.    PT Comments    Pt progressing slowly with post-op mobility, limited mostly by pain. She was able to demonstrate transfers and ambulation with gross min guard assist, however increased time and effort required by end of session due to pain. Reinforced education on precautions, brace application/wearing schedule, appropriate activity progression, and car transfer. Will continue to follow.       Follow Up Recommendations  No PT follow up;Supervision for mobility/OOB     Equipment Recommendations  Rolling walker with 5" wheels    Recommendations for Other Services       Precautions / Restrictions Precautions Precautions: Fall;Back Precaution Booklet Issued: Yes (comment) Precaution Comments: reviewed all spinal precautions Required Braces or Orthoses: Spinal Brace Spinal Brace: Lumbar corset;Applied in sitting position Restrictions Weight Bearing Restrictions: No    Mobility  Bed Mobility Overal bed mobility: Needs Assistance Bed Mobility: Rolling;Sidelying to Sit Rolling: Min guard Sidelying to sit: Min guard     Sit to sidelying: Min guard General bed mobility comments: Close guard for safety and VC's for maintenance of precautions. Pt crying out in pain at times, obviously frustrated with the level  of pain she was feeling in the moment.   Transfers Overall transfer level: Needs assistance Equipment used: Rolling walker (2 wheeled) Transfers: Sit to/from Stand Sit to Stand: Min guard         General transfer comment: Close guard for safety as pt powered up to full stand with RW for support. Initial stand appeared more comfortable than transfers at end of session.   Ambulation/Gait Ambulation/Gait assistance: Min guard Gait Distance (Feet): 150 Feet Assistive device: Rolling walker (2 wheeled) Gait Pattern/deviations: Step-through pattern;Decreased stride length;Trunk flexed Gait velocity: Decreased Gait velocity interpretation: <1.8 ft/sec, indicate of risk for recurrent falls General Gait Details: VC's for improved posture, closer walker proximity, and forward gaze. No assist required however hands-on guarding provided throughout gait training for safety.    Stairs             Wheelchair Mobility    Modified Rankin (Stroke Patients Only)       Balance Overall balance assessment: Mild deficits observed, not formally tested Sitting-balance support: Feet supported;No upper extremity supported Sitting balance-Leahy Scale: Fair     Standing balance support: Bilateral upper extremity supported;During functional activity Standing balance-Leahy Scale: Poor Standing balance comment: Reliant on UE support on walker during OOB mobility.                             Cognition Arousal/Alertness: Awake/alert Behavior During Therapy: WFL for tasks assessed/performed;Anxious Overall Cognitive Status: Within Functional Limits for tasks assessed  General Comments: Pt anxious regarding amount of pain she is experiencing      Exercises      General Comments        Pertinent Vitals/Pain Pain Assessment: Faces Faces Pain Scale: Hurts whole lot Pain Location: Low back Pain Descriptors / Indicators:  Crying;Discomfort;Grimacing;Guarding;Moaning;Operative site guarding;Sore Pain Intervention(s): Limited activity within patient's tolerance;Monitored during session;Repositioned    Home Living                      Prior Function            PT Goals (current goals can now be found in the care plan section) Acute Rehab PT Goals Patient Stated Goal: Decrease pain PT Goal Formulation: With patient Time For Goal Achievement: 02/15/19 Potential to Achieve Goals: Good Progress towards PT goals: Progressing toward goals    Frequency    Min 5X/week      PT Plan Current plan remains appropriate    Co-evaluation              AM-PAC PT "6 Clicks" Mobility   Outcome Measure  Help needed turning from your back to your side while in a flat bed without using bedrails?: None Help needed moving from lying on your back to sitting on the side of a flat bed without using bedrails?: None Help needed moving to and from a bed to a chair (including a wheelchair)?: A Little Help needed standing up from a chair using your arms (e.g., wheelchair or bedside chair)?: A Little Help needed to walk in hospital room?: A Little Help needed climbing 3-5 steps with a railing? : A Little 6 Click Score: 20    End of Session Equipment Utilized During Treatment: Gait belt Activity Tolerance: Patient tolerated treatment well Patient left: in bed;with call bell/phone within reach Nurse Communication: Mobility status PT Visit Diagnosis: Unsteadiness on feet (R26.81);Pain Pain - part of body: (back)     Time: [id number] PT Time Calculation (min) (ACUTE ONLY): 18 min  Charges:  $Gait Training: 8-22 mins                     Rolinda Roan, PT, DPT Acute Rehabilitation Services Pager: [phone] Office: [phone]    Thelma Comp 02/02/2019, 3:01 PM

## 2019-02-02 NOTE — Progress Notes (Signed)
Occupational Therapy Treatment Patient Details Name: Angie Olson MRN: [id number] DOB: 1951/01/25 Today's Date: 02/02/2019    History of present illness Pt is 68 yo female presenting after prior laminectomy and fusion of L2-3 2 weeks ago with increading severe L hip pain as well as pain and numbness down R leg. MRI revealed degenerative collapse and scoliotic deformity the level below the fusion, and left L3 screw migrated lateral from vertebral body. Pt is s/p reexploration of L2-3 fusion with L3-4 posterior lumbar decompression and interbody fusion. PMH significant for CVA/TIA, SOB, OA, HTN, CA, R TKR, bilateral THR, prior back surgeries 2011/2015/2016, L ankle surgery 2010.   OT comments  Pt progressing towards OT goals. Pt continues to present with decreased strength, activity tolerance, balance, knowledge of precautions, safety awareness, and increased pain. Provided education regarding brace management; pt donned brace with min guard A and VCs for correct placement. Pt performed toileting and hand hygiene with min guard A for safety and balance. Pt still in significant pain, and was tearful, anxious, and breathing rapidly; required VCs for pursed lip breathing; notified RN. Recommend dc with HHOT to increase safe performance of ADLs. Will continue to follow acutely as admitted.     Follow Up Recommendations  Home health OT;Supervision/Assistance - 24 hour    Equipment Recommendations  None recommended by OT    Recommendations for Other Services PT consult    Precautions / Restrictions Precautions Precautions: Fall;Back Precaution Booklet Issued: Yes (comment) Precaution Comments: reviewed all spinal precautions Required Braces or Orthoses: Spinal Brace Spinal Brace: Lumbar corset;Applied in sitting position Restrictions Weight Bearing Restrictions: No       Mobility Bed Mobility Overal bed mobility: Needs Assistance Bed Mobility: Rolling;Sidelying to Sit Rolling: Min  guard Sidelying to sit: Min guard       General bed mobility comments: min guard A for safety and to maintain precautions  Transfers Overall transfer level: Needs assistance Equipment used: Rolling walker (2 wheeled) Transfers: Sit to/from Stand Sit to Stand: Min assist;Min guard         General transfer comment: Pt required min A initially to power up to stand, progressing to min guard A for safety and balance    Balance Overall balance assessment: Mild deficits observed, not formally tested                                         ADL either performed or assessed with clinical judgement   ADL Overall ADL's : Needs assistance/impaired     Grooming: Wash/dry hands;Min guard;Standing Grooming Details (indicate cue type and reason): performed hand hygiene with min guard A for safety         Upper Body Dressing : Min guard;Sitting Upper Body Dressing Details (indicate cue type and reason): provided education regarding brace management. Pt performed brace management with min guard A and VCs for correct placement     Toilet Transfer: Min guard;Ambulation;Regular Toilet;RW;Grab bars Toilet Transfer Details (indicate cue type and reason): Pt required min guard A for safety and balance Toileting- Clothing Manipulation and Hygiene: Min guard;Sit to/from stand Toileting - Clothing Manipulation Details (indicate cue type and reason): pt required min guard A for safety     Functional mobility during ADLs: Min guard;Rolling walker General ADL Comments: provided education regarding brace management. required min A initially to power up to stand, but progressed to min guard A for safety.  Performed toileting with min guard A     Vision       Perception     Praxis      Cognition Arousal/Alertness: Awake/alert Behavior During Therapy: WFL for tasks assessed/performed;Anxious Overall Cognitive Status: Within Functional Limits for tasks assessed                                  General Comments: Pt anxious regarding amount of pain she is experiencing        Exercises     Shoulder Instructions       General Comments Pt c/o pain throughout session. RN notified    Pertinent Vitals/ Pain       Pain Assessment: Faces Faces Pain Scale: Hurts whole lot Pain Location: Low back Pain Descriptors / Indicators: Crying;Discomfort;Grimacing;Guarding;Moaning;Operative site guarding;Sore Pain Intervention(s): Limited activity within patient's tolerance;Monitored during session;Patient requesting pain meds-RN notified;Repositioned;Utilized relaxation techniques  Home Living                                          Prior Functioning/Environment              Frequency  Min 3X/week        Progress Toward Goals  OT Goals(current goals can now be found in the care plan section)  Progress towards OT goals: Progressing toward goals  Acute Rehab OT Goals Patient Stated Goal: Home today OT Goal Formulation: With patient Time For Goal Achievement: 02/15/19 Potential to Achieve Goals: Good ADL Goals Pt Will Perform Grooming: with supervision;standing Pt Will Perform Upper Body Dressing: with supervision;sitting Pt Will Perform Lower Body Dressing: with min guard assist;sit to/from stand Pt Will Transfer to Toilet: with supervision;ambulating;bedside commode Pt Will Perform Toileting - Clothing Manipulation and hygiene: with supervision;sit to/from stand Pt Will Perform Tub/Shower Transfer: with supervision;shower seat;ambulating;rolling walker Additional ADL Goal #1: Pt will demonstrate log rolling for bed mobility in preparation for ADLs with min VCs. Additional ADL Goal #2: Pt will verbalize spinal precautions with min VCs  Plan Discharge plan remains appropriate;Frequency remains appropriate    Co-evaluation                 AM-PAC OT "6 Clicks" Daily Activity     Outcome Measure   Help from  another person eating meals?: None Help from another person taking care of personal grooming?: None Help from another person toileting, which includes using toliet, bedpan, or urinal?: A Little Help from another person bathing (including washing, rinsing, drying)?: A Little Help from another person to put on and taking off regular upper body clothing?: A Little Help from another person to put on and taking off regular lower body clothing?: A Little 6 Click Score: 20    End of Session Equipment Utilized During Treatment: Rolling walker;Back brace  OT Visit Diagnosis: Unsteadiness on feet (R26.81);Muscle weakness (generalized) (M62.81);Pain Pain - part of body: (back)   Activity Tolerance Patient limited by pain   Patient Left in chair;with call bell/phone within reach   Nurse Communication Mobility status;Patient requests pain meds        Time: [id number] OT Time Calculation (min): 20 min  Charges: OT General Charges $OT Visit: 1 Visit OT Treatments $Self Care/Home Management : 8-22 mins  Gus Rankin, OT Student  Di Kindle Xiana Carns 02/02/2019, 12:00 PM

## 2019-02-02 NOTE — Progress Notes (Signed)
Subjective: Patient reports Patient had recurrence of left leg pain yesterday afternoon after therapy overall leg pain still better than preop but left L3 radicular symptoms new as of yesterday afternoon  Objective: Vital signs in last 24 hours: Temp:  [98.5 F (36.9 C)-99.2 F (37.3 C)] 98.7 F (37.1 C) (11/18 0738) Pulse Rate:  [93-105] 105 (11/18 0738) Resp:  [18] 18 (11/18 0738) BP: (116-142)/(58-75) 129/66 (11/18 0738) SpO2:  [94 %-98 %] 96 % (11/18 0738)  Intake/Output from previous day: 11/17 0701 - 11/18 0700 In: 200 [IV Piggyback:200] Out: 70 [Drains:70] Intake/Output this shift: Total I/O In: -  Out: 60 [Drains:60]  Strength out of 5 wound clean dry and intact  Lab Results: Recent Labs    01/31/19 0734  WBC 8.5  HGB 10.1*  HCT 33.4*  PLT 345   BMET Recent Labs    01/31/19 0734  NA 140  K 4.0  CL 101  CO2 27  GLUCOSE 113*  BUN 14  CREATININE 0.71  CALCIUM 8.6*    Studies/Results: Dg Lumbar Spine 2-3 Views  Result Date: 01/31/2019 CLINICAL DATA:  Surgical fusion of L2 through L4. EXAM: LUMBAR SPINE - 2-3 VIEW; DG C-ARM 1-60 MIN FLUOROSCOPY TIME:  39 seconds. COMPARISON:  January 10, 2019. FINDINGS: Two intraoperative fluoroscopic images were obtained of the lumbar spine during surgical posterior fusion. Intrapedicular screws are noted at what appears to be L2 and L4 bilaterally. Interbody fusion is noted at L2-3 and L3-4. Good alignment of vertebral bodies is noted. IMPRESSION: Fluoroscopic guidance provided during surgical posterior fusion of lumbar spine. Electronically Signed   By: Marijo Conception M.D.   On: 01/31/2019 14:18   Dg C-arm 1-60 Min  Result Date: 01/31/2019 CLINICAL DATA:  Surgical fusion of L2 through L4. EXAM: LUMBAR SPINE - 2-3 VIEW; DG C-ARM 1-60 MIN FLUOROSCOPY TIME:  39 seconds. COMPARISON:  January 10, 2019. FINDINGS: Two intraoperative fluoroscopic images were obtained of the lumbar spine during surgical posterior fusion.  Intrapedicular screws are noted at what appears to be L2 and L4 bilaterally. Interbody fusion is noted at L2-3 and L3-4. Good alignment of vertebral bodies is noted. IMPRESSION: Fluoroscopic guidance provided during surgical posterior fusion of lumbar spine. Electronically Signed   By: Marijo Conception M.D.   On: 01/31/2019 14:18    Assessment/Plan: Patient postop day 2 for revision of spinal fusion with development of new acute left L3 radicular symptoms yesterday afternoon.  Will order CT scan of her lumbar spine to evaluate her fusion construct.  LOS: 2 days     Delano Scardino P 02/02/2019, 10:24 AM

## 2019-02-03 NOTE — Progress Notes (Signed)
Physical Therapy Treatment Patient Details Name: Angie Olson MRN: [id number] DOB: [birth year] Today's Date: 02/03/2019    History of Present Illness Pt is 68 yo female presenting after prior laminectomy and fusion of L2-3 2 weeks ago with increading severe L hip pain as well as pain and numbness down R leg. MRI revealed degenerative collapse and scoliotic deformity the level below the fusion, and left L3 screw migrated lateral from vertebral body. Pt is s/p reexploration of L2-3 fusion with L3-4 posterior lumbar decompression and interbody fusion. PMH significant for CVA/TIA, SOB, OA, HTN, CA, R TKR, bilateral THR, prior back surgeries 2011/2015/2016, L ankle surgery 2010.    PT Comments    Pt progressing slowly with post-op mobility. She was able to demonstrate transfers with gross min guard assist to supervision for safety with RW, however ambulation was limited this session. When PT arrived, pt sitting EOB and reports seeing spots and needing to lay down. Orthostatic vitals taken and outlined below. Pt demonstrating poor safety awareness and poor insight to deficits, insisting she needed to get up and get dressed after low BP rating, stating it was "pretty good". RN notified of current vitals. Will continue to follow.    Orthostatic VS for the past 24 hrs:  BP- Lying BP- Sitting BP- Standing at 0 minutes  02/03/19 0900 108/57 (!) 87/54 (!) 71/47       Follow Up Recommendations  No PT follow up;Supervision for mobility/OOB     Equipment Recommendations  Rolling walker with 5" wheels    Recommendations for Other Services       Precautions / Restrictions Precautions Precautions: Fall;Back Precaution Booklet Issued: Yes (comment) Precaution Comments: reviewed all spinal precautions Required Braces or Orthoses: Spinal Brace Spinal Brace: Lumbar corset;Applied in sitting position Restrictions Weight Bearing Restrictions: No    Mobility  Bed Mobility Overal bed mobility:  Needs Assistance Bed Mobility: Rolling;Sidelying to Sit Rolling: Modified independent (Device/Increase time) Sidelying to sit: Supervision     Sit to sidelying: Supervision General bed mobility comments: Supervision for safety and optimal log roll technique during transitions to/from EOB.   Transfers Overall transfer level: Needs assistance Equipment used: Rolling walker (2 wheeled) Transfers: Sit to/from Stand Sit to Stand: Min guard         General transfer comment: VC's for hand placement on seated surface for safety and to maintain upright posture.   Ambulation/Gait             General Gait Details: BP did not allow for ambulation this session.    Stairs             Wheelchair Mobility    Modified Rankin (Stroke Patients Only)       Balance Overall balance assessment: Mild deficits observed, not formally tested Sitting-balance support: Feet supported;No upper extremity supported Sitting balance-Leahy Scale: Fair     Standing balance support: Bilateral upper extremity supported;During functional activity Standing balance-Leahy Scale: Poor Standing balance comment: Reliant on UE support on walker during OOB mobility.                             Cognition Arousal/Alertness: Awake/alert Behavior During Therapy: WFL for tasks assessed/performed;Anxious Overall Cognitive Status: Within Functional Limits for tasks assessed  Exercises      General Comments        Pertinent Vitals/Pain Pain Assessment: Faces Faces Pain Scale: Hurts even more Pain Location: Low back Pain Descriptors / Indicators: Crying;Discomfort;Grimacing;Guarding;Moaning;Operative site guarding;Sore Pain Intervention(s): Limited activity within patient's tolerance;Monitored during session;Repositioned    Home Living                      Prior Function            PT Goals (current goals can now be  found in the care plan section) Acute Rehab PT Goals Patient Stated Goal: Decrease pain PT Goal Formulation: With patient Time For Goal Achievement: 02/15/19 Potential to Achieve Goals: Good Progress towards PT goals: Progressing toward goals    Frequency    Min 5X/week      PT Plan Current plan remains appropriate    Co-evaluation              AM-PAC PT "6 Clicks" Mobility   Outcome Measure  Help needed turning from your back to your side while in a flat bed without using bedrails?: None Help needed moving from lying on your back to sitting on the side of a flat bed without using bedrails?: None Help needed moving to and from a bed to a chair (including a wheelchair)?: A Little Help needed standing up from a chair using your arms (e.g., wheelchair or bedside chair)?: A Little Help needed to walk in hospital room?: A Little Help needed climbing 3-5 steps with a railing? : A Little 6 Click Score: 20    End of Session Equipment Utilized During Treatment: Gait belt Activity Tolerance: Patient tolerated treatment well Patient left: in bed;with call bell/phone within reach Nurse Communication: Mobility status PT Visit Diagnosis: Unsteadiness on feet (R26.81);Pain Pain - part of body: (back)     Time: [id number] PT Time Calculation (min) (ACUTE ONLY): 22 min  Charges:  $Therapeutic Activity: 8-22 mins                     Rolinda Roan, PT, DPT Acute Rehabilitation Services Pager: [phone] Office: [phone]    Thelma Comp 02/03/2019, 10:26 AM

## 2019-02-03 NOTE — Discharge Instructions (Signed)

## 2019-02-03 NOTE — Discharge Summary (Signed)
Physician Discharge Summary  Patient ID: Angie Olson MRN: [id number] DOB/AGE: 1950/09/09 68 y.o. Estimated body mass index is 27.11 kg/m as calculated from the following:   Height as of this encounter: 5\' 5"  (1.651 m).   Weight as of this encounter: 73.9 kg.   Admit date: 01/31/2019 Discharge date: 02/03/2019  Admission Diagnoses: Failed fusion fractured L3 pedicles and instability L3-4  Discharge Diagnoses: Same Active Problems:   HNP (herniated nucleus pulposus), lumbar   Discharged Condition: good  Hospital Course: Patient is admitted to the hospital underwent CT scan on arrival which showed fractured L3 pedicles instability L2-3 and 3 4.  Patient was urgently taken back to the OR underwent revision of L2-3 fusion with an L3-4 transforaminal interbody fusion.  Postoperatively patient did very well and covering the floor on the floor was ambulating and voiding spontaneously tolerating regular diet and stable for discharge home.  Consults: Significant Diagnostic Studies: Treatments: Status post L2-3 revision of fusion L3-4 transforaminal interbody fusion Discharge Exam: Blood pressure (!) 104/51, pulse 97, temperature 98.8 F (37.1 C), temperature source Oral, resp. rate 16, height 5\' 5"  (1.651 m), weight 73.9 kg, SpO2 92 %. Strength out of 5 wound clean dry and intact  Disposition: Home   Allergies as of 02/03/2019      Reactions   Methotrexate Derivatives Other (See Comments)   Causes recurrent infections   Pseudoephedrine Other (See Comments)   Makes jittery    Reglan [metoclopramide] Other (See Comments)   insomnia   Vicodin [hydrocodone-acetaminophen] Other (See Comments)   "Makes me feel weird"   Oxycodone Itching, Rash   Patient tolerated hydromorphone without reaction.      Medication List    TAKE these medications   ALPRAZolam 1 MG tablet Commonly known as: XANAX Take 1 mg by mouth at bedtime.   amLODipine 5 MG tablet Commonly known as:  NORVASC Take 5 mg by mouth daily.   atorvastatin 20 MG tablet Commonly known as: LIPITOR Take 20 mg by mouth daily.   cetirizine 10 MG tablet Commonly known as: ZYRTEC Take 10 mg by mouth daily.   esomeprazole 20 MG capsule Commonly known as: NEXIUM Take 20 mg by mouth daily before breakfast.   gabapentin 300 MG capsule Commonly known as: NEURONTIN Take 600 mg by mouth 3 (three) times daily.   HYDROmorphone 4 MG tablet Commonly known as: DILAUDID Take 4 mg by mouth every 4 (four) hours as needed for moderate pain.   hydroxychloroquine 200 MG tablet Commonly known as: PLAQUENIL Take 200 mg by mouth 2 (two) times daily.   leflunomide 20 MG tablet Commonly known as: ARAVA Take 20 mg by mouth daily.   lisinopril 40 MG tablet Commonly known as: ZESTRIL Take 40 mg by mouth daily.   methocarbamol 750 MG tablet Commonly known as: ROBAXIN Take 750 mg by mouth every 4 (four) hours.   methylPREDNISolone 4 MG Tbpk tablet Commonly known as: MEDROL DOSEPAK Take 4 mg by mouth daily.   nortriptyline 25 MG capsule Commonly known as: PAMELOR Take 25 mg by mouth at bedtime.   predniSONE 5 MG tablet Commonly known as: DELTASONE Take 5 mg by mouth daily. Continuous for rheumatoid arthritis   sucralfate 1 g tablet Commonly known as: CARAFATE Take 1 g by mouth 3 (three) times daily as needed (indigestion).   Tylenol 8 Hour Arthritis Pain 650 MG CR tablet Generic drug: acetaminophen Take 1,300 mg by mouth every 8 (eight) hours as needed for pain.   Vitamin D3  1.25 MG (50000 UT) Caps Take 50,000 Units by mouth every Sunday.        Signed: Cici Rodriges P 02/03/2019, 8:03 AM

## 2019-02-03 NOTE — Progress Notes (Signed)
Patient alert and oriented, mae's well, voiding adequate amount of urine, swallowing without difficulty, no c/o pain at time of discharge. Patient discharged home with family. Script and discharged instructions given to patient. Patient and family stated understanding of instructions given. Patient has an appointment with Dr. Cram 

## 2019-02-03 NOTE — Progress Notes (Signed)
Occupational Therapy Treatment Patient Details Name: Angie Olson MRN: [id number] DOB: 09/10/1950 Today's Date: 02/03/2019    History of present illness Pt is 68 yo female presenting after prior laminectomy and fusion of L2-3 2 weeks ago with increading severe L hip pain as well as pain and numbness down R leg. MRI revealed degenerative collapse and scoliotic deformity the level below the fusion, and left L3 screw migrated lateral from vertebral body. Pt is s/p reexploration of L2-3 fusion with L3-4 posterior lumbar decompression and interbody fusion. PMH significant for CVA/TIA, SOB, OA, HTN, CA, R TKR, bilateral THR, prior back surgeries 2011/2015/2016, L ankle surgery 2010.   OT comments  Pt making steady progress towards OT goals this session. Overall, pt supervision for sit>stand with RW from EOB. MAX A for LB ADL, supervision for bed mobility via log roll technique, supervision for standing grooming with RW, min guard for toilet transfer with RW and MAX A for posterior pericare. Pt with slight hypotension throughout session ( see vitals below- RN aware). Provided extensive education on back precautions and brace management and wearing schedule with pt able to state 3/3 precautions at end of session. Pt hopeful to DC home today with HHOT, will follow acutely per POC.  Supine: 119/56 EOB: 113/52 Standing: 100/57 Seated after toileting: 92/43   Follow Up Recommendations  Home health OT;Supervision/Assistance - 24 hour    Equipment Recommendations  None recommended by OT    Recommendations for Other Services      Precautions / Restrictions Precautions Precautions: Fall;Back Precaution Booklet Issued: Yes (comment) Precaution Comments: reviewed all spinal precautions; able to recall 2/2 at start of session, progressed to 3/3 by end of session Required Braces or Orthoses: Spinal Brace Spinal Brace: Lumbar corset;Applied in sitting position Restrictions Weight Bearing  Restrictions: No       Mobility Bed Mobility Overal bed mobility: Needs Assistance Bed Mobility: Rolling;Sidelying to Sit Rolling: Modified independent (Device/Increase time) Sidelying to sit: Supervision     Sit to sidelying: Supervision General bed mobility comments: supervision for safety, pt able to carryover log roll technique with MIN cues  Transfers Overall transfer level: Needs assistance Equipment used: Rolling walker (2 wheeled) Transfers: Sit to/from Stand Sit to Stand: Supervision         General transfer comment: no physical assist needed, supervision for safety    Balance Overall balance assessment: Needs assistance Sitting-balance support: Feet supported;No upper extremity supported Sitting balance-Leahy Scale: Fair     Standing balance support: Single extremity supported;During functional activity Standing balance-Leahy Scale: Poor Standing balance comment: at least one extremity supported during standing grooming                           ADL either performed or assessed with clinical judgement   ADL Overall ADL's : Needs assistance/impaired     Grooming: Wash/dry hands;Standing;Supervision/safety;Oral care Grooming Details (indicate cue type and reason): performed hand hygiene and oral care with supervision for safety         Upper Body Dressing : Moderate assistance;Sitting Upper Body Dressing Details (indicate cue type and reason): provided education regarding brace management. Pt performed brace management with MOD A. Pt initially forgetting to place front plate but self correct after cues     Toilet Transfer: Min guard;Ambulation;Regular Toilet;RW;Grab bars Toilet Transfer Details (indicate cue type and reason): Pt required min guard A for safety and balance Toileting- Clothing Manipulation and Hygiene: Maximal assistance;Sitting/lateral lean;Sit to/from stand Toileting -  Clothing Manipulation Details (indicate cue type and  reason): pt attempted toileting hygiene via lateral leans but required MAX A for cleanliness via sit>stand with RW and grab bars to assist with powering into standing     Functional mobility during ADLs: Min guard;Rolling walker General ADL Comments: provided education regarding brace management and all back precautions, pt sit>stand from EOB with supervision with RW, MAX A toileting hygiene, min guard toileting transfer with RW and use of grab bars noted     Vision Baseline Vision/History: Wears glasses Wears Glasses: At all times Patient Visual Report: No change from baseline     Perception     Praxis      Cognition Arousal/Alertness: Awake/alert Behavior During Therapy: WFL for tasks assessed/performed;Anxious Overall Cognitive Status: Within Functional Limits for tasks assessed                                 General Comments: pt anxious not wanting therapist to assist with posterior pericare        Exercises     Shoulder Instructions       General Comments      Pertinent Vitals/ Pain       Pain Assessment: 0-10 Pain Score: 5  Faces Pain Scale: Hurts even more Pain Location: Low back Pain Descriptors / Indicators: Discomfort;Grimacing;Guarding;Operative site guarding;Sore Pain Intervention(s): Limited activity within patient's tolerance;Monitored during session;Repositioned  Home Living                                          Prior Functioning/Environment              Frequency  Min 3X/week        Progress Toward Goals  OT Goals(current goals can now be found in the care plan section)  Progress towards OT goals: Progressing toward goals  Acute Rehab OT Goals Patient Stated Goal: Decrease pain OT Goal Formulation: With patient Time For Goal Achievement: 02/15/19 Potential to Achieve Goals: Good  Plan Discharge plan remains appropriate    Co-evaluation                 AM-PAC OT "6 Clicks" Daily  Activity     Outcome Measure   Help from another person eating meals?: None Help from another person taking care of personal grooming?: None Help from another person toileting, which includes using toliet, bedpan, or urinal?: A Little Help from another person bathing (including washing, rinsing, drying)?: A Little Help from another person to put on and taking off regular upper body clothing?: A Little Help from another person to put on and taking off regular lower body clothing?: A Little 6 Click Score: 20    End of Session Equipment Utilized During Treatment: Rolling walker;Back brace  OT Visit Diagnosis: Unsteadiness on feet (R26.81);Muscle weakness (generalized) (M62.81);Pain   Activity Tolerance Patient tolerated treatment well   Patient Left in bed;with call bell/phone within reach;with bed alarm set   Nurse Communication Mobility status;Other (comment)(low BP)        Time: [id number] OT Time Calculation (min): 30 min  Charges: OT General Charges $OT Visit: 1 Visit OT Treatments $Self Care/Home Management : 23-37 mins  Lanier Clam., COTA/L Acute Rehabilitation Services [phone] [place] 02/03/2019, 11:04 AM

## 2019-02-05 ENCOUNTER — Inpatient Hospital Stay
Admission: AD | Admit: 2019-02-05 | Payer: Medicare Other | Source: Other Acute Inpatient Hospital | Admitting: Family Medicine

## 2019-02-06 DIAGNOSIS — D649 Anemia, unspecified: Secondary | ICD-10-CM | POA: Insufficient documentation

## 2019-02-06 DIAGNOSIS — R7989 Other specified abnormal findings of blood chemistry: Secondary | ICD-10-CM | POA: Insufficient documentation

## 2019-02-06 DIAGNOSIS — A419 Sepsis, unspecified organism: Secondary | ICD-10-CM | POA: Insufficient documentation

## 2019-02-06 DIAGNOSIS — D75839 Thrombocytosis, unspecified: Secondary | ICD-10-CM | POA: Insufficient documentation

## 2019-02-06 DIAGNOSIS — T888XXA Other specified complications of surgical and medical care, not elsewhere classified, initial encounter: Secondary | ICD-10-CM | POA: Insufficient documentation

## 2019-02-06 HISTORY — DX: Thrombocytosis, unspecified: D75.839

## 2019-02-07 DIAGNOSIS — J189 Pneumonia, unspecified organism: Secondary | ICD-10-CM | POA: Insufficient documentation

## 2019-02-07 DIAGNOSIS — S82899A Other fracture of unspecified lower leg, initial encounter for closed fracture: Secondary | ICD-10-CM | POA: Insufficient documentation

## 2019-02-07 HISTORY — DX: Pneumonia, unspecified organism: J18.9

## 2019-02-11 MED FILL — Heparin Sodium (Porcine) Inj 1000 Unit/ML: INTRAMUSCULAR | Qty: 30 | Status: AC

## 2019-02-11 MED FILL — Sodium Chloride IV Soln 0.9%: INTRAVENOUS | Qty: 2000 | Status: AC

## 2019-02-17 DIAGNOSIS — U071 COVID-19: Secondary | ICD-10-CM | POA: Insufficient documentation

## 2019-02-17 HISTORY — DX: COVID-19: U07.1

## 2019-03-01 DIAGNOSIS — T148XXA Other injury of unspecified body region, initial encounter: Secondary | ICD-10-CM | POA: Insufficient documentation

## 2019-03-18 HISTORY — PX: CARPAL TUNNEL RELEASE: SHX101

## 2019-05-31 ENCOUNTER — Other Ambulatory Visit: Payer: Self-pay | Admitting: Neurosurgery

## 2019-05-31 DIAGNOSIS — M4807 Spinal stenosis, lumbosacral region: Secondary | ICD-10-CM

## 2019-06-14 ENCOUNTER — Ambulatory Visit
Admission: RE | Admit: 2019-06-14 | Discharge: 2019-06-14 | Disposition: A | Discharge status: Home / Self Care | Payer: Medicare Other | Source: Ambulatory Visit | Attending: Neurosurgery | Admitting: Neurosurgery

## 2019-06-14 ENCOUNTER — Other Ambulatory Visit: Payer: Self-pay

## 2019-06-14 DIAGNOSIS — M4807 Spinal stenosis, lumbosacral region: Secondary | ICD-10-CM

## 2019-10-04 DIAGNOSIS — M25561 Pain in right knee: Secondary | ICD-10-CM | POA: Insufficient documentation

## 2019-10-18 ENCOUNTER — Other Ambulatory Visit: Payer: Self-pay | Admitting: Neurosurgery

## 2020-01-03 DIAGNOSIS — M810 Age-related osteoporosis without current pathological fracture: Secondary | ICD-10-CM | POA: Insufficient documentation

## 2020-01-03 DIAGNOSIS — L989 Disorder of the skin and subcutaneous tissue, unspecified: Secondary | ICD-10-CM | POA: Insufficient documentation

## 2020-01-03 DIAGNOSIS — K589 Irritable bowel syndrome without diarrhea: Secondary | ICD-10-CM

## 2020-01-03 DIAGNOSIS — M1990 Unspecified osteoarthritis, unspecified site: Secondary | ICD-10-CM | POA: Insufficient documentation

## 2020-01-03 HISTORY — DX: Irritable bowel syndrome, unspecified: K58.9

## 2020-01-19 ENCOUNTER — Encounter (HOSPITAL_COMMUNITY)
Admission: RE | Admit: 2020-01-19 | Discharge: 2020-01-19 | Disposition: A | Discharge status: Home / Self Care | Payer: Medicare Other | Source: Ambulatory Visit | Attending: Neurosurgery | Admitting: Neurosurgery

## 2020-01-19 ENCOUNTER — Other Ambulatory Visit (HOSPITAL_COMMUNITY)
Admission: RE | Admit: 2020-01-19 | Discharge: 2020-01-19 | Disposition: A | Discharge status: Home / Self Care | Payer: Medicare Other | Source: Ambulatory Visit | Attending: Neurosurgery | Admitting: Neurosurgery

## 2020-01-19 ENCOUNTER — Encounter (HOSPITAL_COMMUNITY): Payer: Self-pay

## 2020-01-19 ENCOUNTER — Other Ambulatory Visit: Payer: Self-pay

## 2020-01-19 DIAGNOSIS — Z01818 Encounter for other preprocedural examination: Secondary | ICD-10-CM | POA: Insufficient documentation

## 2020-01-19 DIAGNOSIS — Z20822 Contact with and (suspected) exposure to covid-19: Secondary | ICD-10-CM | POA: Insufficient documentation

## 2020-01-19 DIAGNOSIS — Z01812 Encounter for preprocedural laboratory examination: Secondary | ICD-10-CM | POA: Insufficient documentation

## 2020-01-19 LAB — CBC
HCT: 35.4 % — ABNORMAL LOW (ref 36.0–46.0)
Hemoglobin: 10.5 g/dL — ABNORMAL LOW (ref 12.0–15.0)
MCH: 24.1 pg — ABNORMAL LOW (ref 26.0–34.0)
MCHC: 29.7 g/dL — ABNORMAL LOW (ref 30.0–36.0)
MCV: 81.4 fL (ref 80.0–100.0)
Platelets: 337 10*3/uL (ref 150–400)
RBC: 4.35 MIL/uL (ref 3.87–5.11)
RDW: 15.6 % — ABNORMAL HIGH (ref 11.5–15.5)
WBC: 7.6 10*3/uL (ref 4.0–10.5)
nRBC: 0 % (ref 0.0–0.2)

## 2020-01-19 LAB — BASIC METABOLIC PANEL
Anion gap: 10 (ref 5–15)
BUN: 16 mg/dL (ref 8–23)
CO2: 27 mmol/L (ref 22–32)
Calcium: 8.8 mg/dL — ABNORMAL LOW (ref 8.9–10.3)
Chloride: 106 mmol/L (ref 98–111)
Creatinine, Ser: 0.76 mg/dL (ref 0.44–1.00)
GFR, Estimated: >60 mL/min (ref >60)
Glucose, Bld: 106 mg/dL — ABNORMAL HIGH (ref 70–99)
Potassium: 3.7 mmol/L (ref 3.5–5.1)
Sodium: 143 mmol/L (ref 135–145)

## 2020-01-19 LAB — SURGICAL PCR SCREEN
MRSA, PCR: NEGATIVE
Staphylococcus aureus: NEGATIVE

## 2020-01-19 LAB — SARS CORONAVIRUS 2 (TAT 6-24 HRS): SARS Coronavirus 2: NEGATIVE

## 2020-01-19 NOTE — Progress Notes (Signed)
[facility] [place], [place] [address] Phone: [phone] Fax: [phone]      Your procedure is scheduled on Monday January 23, 2020.  Report to [facility] Main Entrance "A" at 05:30 A.M., and check in at the Admitting office.  Call this number if you have problems the morning of surgery:  [phone]  Call [phone] if you have any questions prior to your surgery date Monday-Friday 8am-4pm    Remember:  Do not eat or drink after midnight the night before your surgery     Take these medicines the morning of surgery with A SIP OF WATER: Amlodipine (Norvasc) Atorvastatin (Lipitor) Cetirizine (Zyrtec) Esomeprazole (Nexium) Gabapentin (Neurontin)  If needed you may take the following medications: Alprazolam (Xanax)  As of tomorrow, stop taking your Hydroxychloroquine (Plaquenil) and Leflunomide (Alsip)  As of today, STOP taking any Aspirin (unless otherwise instructed by your surgeon) Aleve, Naproxen, Ibuprofen, Motrin, Advil, Goody's, BC's, all herbal medications, fish oil, and all vitamins.                      Do not wear jewelry, make up, or nail polish            Do not wear lotions, powders, perfumes, or deodorant.            Do not shave 48 hours prior to surgery.  Men may shave face and neck.            Do not bring valuables to the hospital.            [facility] is not responsible for any belongings or valuables.  Do NOT Smoke (Tobacco/Vaping) or drink Alcohol 24 hours prior to your procedure  If you use a CPAP at night, you may bring all equipment for your overnight stay.   Contacts, glasses, dentures or bridgework may not be worn into surgery.      For patients admitted to the hospital, discharge time will be determined by your treatment team.   Patients discharged the day of surgery will not be allowed to drive home, and someone needs  to stay with them for 24 hours.    Special instructions:   [place]- Preparing For Surgery  Before surgery, you can play an important role. Because skin is not sterile, your skin needs to be as free of germs as possible. You can reduce the number of germs on your skin by washing with CHG (chlorahexidine gluconate) Soap before surgery.  CHG is an antiseptic cleaner which kills germs and bonds with the skin to continue killing germs even after washing.    Oral Hygiene is also important to reduce your risk of infection.  Remember - BRUSH YOUR TEETH THE MORNING OF SURGERY WITH YOUR REGULAR TOOTHPASTE  Please do not use if you have an allergy to CHG or antibacterial soaps. If your skin becomes reddened/irritated stop using the CHG.  Do not shave (including legs and underarms) for at least 48 hours prior to first CHG shower. It is OK to shave your face.  Please follow these instructions carefully.   1. Shower the NIGHT BEFORE SURGERY and the MORNING OF SURGERY with CHG Soap.   2. If you chose to wash your hair, wash your hair first as usual with your normal shampoo.  3. After you shampoo, rinse your hair and body thoroughly to  remove the shampoo.  4. Use CHG as you would any other liquid soap. You can apply CHG directly to the skin and wash gently with a scrungie or a clean washcloth.   5. Apply the CHG Soap to your body ONLY FROM THE NECK DOWN.  Do not use on open wounds or open sores. Avoid contact with your eyes, ears, mouth and genitals (private parts). Wash Face and genitals (private parts)  with your normal soap.   6. Wash thoroughly, paying special attention to the area where your surgery will be performed.  7. Thoroughly rinse your body with warm water from the neck down.  8. DO NOT shower/wash with your normal soap after using and rinsing off the CHG Soap.  9. Pat yourself dry with a CLEAN TOWEL.  10. Wear CLEAN PAJAMAS to bed the night before surgery  11. Place CLEAN SHEETS  on your bed the night of your first shower and DO NOT SLEEP WITH PETS.   Day of Surgery: Shower with CHG soap as directed Wear Clean/Comfortable clothing the morning of surgery Do not apply any deodorants/lotions.   Remember to brush your teeth WITH YOUR REGULAR TOOTHPASTE.   Please read over the following fact sheets that you were given.

## 2020-01-19 NOTE — Progress Notes (Signed)
PCP - Dr. Velna Hatchet Cardiologist - patient denies  PPM/ICD - n/a Device Orders -  Rep Notified -   Chest x-ray - n/a EKG - 01/19/2020 Stress Test - 06/2013 ECHO - 06/2013 Cardiac Cath - patient denies  Sleep Study - patient denies CPAP -   Fasting Blood Sugar - n/a Checks Blood Sugar _____ times a day  Blood Thinner Instructions: patient denies Aspirin Instructions: patient denies  ERAS Protcol - n/a PRE-SURGERY Ensure or G2-   COVID TEST- 01/19/2020   Anesthesia review: yes, history of cardiac testing after TIA; per anesthesia history patient has history of anesthesia complications in 4076  Patient denies shortness of breath, fever, cough and chest pain at PAT appointment   All instructions explained to the patient, with a verbal understanding of the material. Patient agrees to go over the instructions while at home for a better understanding. Patient also instructed to self quarantine after being tested for COVID-19. The opportunity to ask questions was provided.

## 2020-01-20 NOTE — Anesthesia Preprocedure Evaluation (Addendum)
Anesthesia Evaluation  Patient identified by MRN, date of birth, ID band Patient awake    Reviewed: Allergy & Precautions, NPO status , Patient's Chart, lab work & pertinent test results  History of Anesthesia Complications (+) PONV  Airway Mallampati: II  TM Distance: >3 FB Neck ROM: Full    Dental  (+) Dental Advisory Given   Pulmonary neg pulmonary ROS,    breath sounds clear to auscultation       Cardiovascular hypertension, Pt. on medications  Rhythm:Regular Rate:Normal     Neuro/Psych CVA    GI/Hepatic Neg liver ROS, hiatal hernia, GERD  ,  Endo/Other  negative endocrine ROS  Renal/GU Renal disease     Musculoskeletal  (+) Arthritis ,   Abdominal   Peds  Hematology  (+) anemia ,   Anesthesia Other Findings   Reproductive/Obstetrics                           Lab Results  Component Value Date   WBC 7.6 01/19/2020   HGB 10.5 (L) 01/19/2020   HCT 35.4 (L) 01/19/2020   MCV 81.4 01/19/2020   PLT 337 01/19/2020   Lab Results  Component Value Date   CREATININE 0.76 01/19/2020   BUN 16 01/19/2020   NA 143 01/19/2020   K 3.7 01/19/2020   CL 106 01/19/2020   CO2 27 01/19/2020    Anesthesia Physical Anesthesia Plan  ASA: III  Anesthesia Plan: General   Post-op Pain Management:    Induction: Intravenous  PONV Risk Score and Plan: 4 or greater and Dexamethasone, Ondansetron and Treatment may vary due to age or medical condition  Airway Management Planned: Oral ETT  Additional Equipment:   Intra-op Plan:   Post-operative Plan: Extubation in OR  Informed Consent: I have reviewed the patients History and Physical, chart, labs and discussed the procedure including the risks, benefits and alternatives for the proposed anesthesia with the patient or authorized representative who has indicated his/her understanding and acceptance.     Dental advisory given  Plan  Discussed with: CRNA  Anesthesia Plan Comments:       Anesthesia Quick Evaluation

## 2020-01-20 NOTE — Progress Notes (Signed)
Anesthesia Chart Review:  Case: [id number] Date/Time: 01/23/20 0715   Procedure: Posterior lateral fusion - T10-T11 - T11-T12 - T12-L1 - L1-L2 revision with removal globus (N/A Back)   Anesthesia type: General   Pre-op diagnosis: fracture with nonunion   Location: [facility] OR ROOM 20 / [facility]   Surgeons: Kary Kos, MD      DISCUSSION: Patient is a 69 year old female scheduled for the above procedure.    History includes never smoker, post-operative N/V, HTN, GERD, hiatal hernia, CVA (TIA 03/2011), prediabetes (denied current issue), exertional dyspnea, melanoma (face), RA, pulmonary nodules (had 07/25/09 chest CT for f/u pulmonary nodules that was stable from 06/13/08 and "considered benign"), chronic bronchitis, hypercholesterolemia, back surgery (L5-S1 PLIF 12/05/13 with I7D for epidural hematoma 01/11/14; removal L5-S1 hardware with posterior lateral arthrodesis 07/05/14; redo L5-S1 PLIF 04/07/16; decompressive lumbar laminectomy L2-3, PLIF L2-3 01/10/19; removal of L2-3 fusion hardware, redo L2-3 laminotomies, L3-4 transforaminal interbody fusion, posterolateral arthrodesis L2-4 01/31/19), fall with right nondisplaced fibula fracture 02/05/19 ([facility] admission for ankle fracture, possible lumbar seroma, 02/09/19 + COVID-19 screening test).   - She reported respiratory arrest after 2020 surgery. In review of records, S/p decompressive lumbar laminectomy L2-3, PLIF L2-3 01/10/2019. According to Anesthesia Postprocedure Note, "Patient required narcan in PACU shortly after arrival following brief desaturation event from oversedation. Oxygen saturations quickly recovered with ambubag mask until patient returned breathing adequately on her own." I do not seen any documented anesthesia complications/events from her 01/31/19 surgery.   01/19/20 COVID-19 test negative. Anesthesia team to evaluate on the day of surgery.  She is on prednisone 5 mg daily as part of her RA regimen.   VS: BP (!) 170/84    Pulse 89     Temp 36.6 C (Oral)    Resp 18    Ht 5\' 5"  (1.651 m)    Wt 78.1 kg    SpO2 100%    BMI 28.64 kg/m    PROVIDERS: Velna Hatchet, MD is PCP Gavin Pound, MD is rheumatologist  - She is not followed routinely by cardiologist, but saw Fransico Him, MD in 2015 for fatigue.  Stress and echo done (see below).   LABS: Labs reviewed: Acceptable for surgery. HGB 10.5, previously 8.6 02/14/19, 10.1 01/31/19, 11.9 01/10/19. T&S done.  (all labs ordered are listed, but only abnormal results are displayed)  Labs Reviewed  BASIC METABOLIC PANEL - Abnormal; Notable for the following components:      Result Value   Glucose, Bld 106 (*)    Calcium 8.8 (*)    All other components within normal limits  CBC - Abnormal; Notable for the following components:   Hemoglobin 10.5 (*)    HCT 35.4 (*)    MCH 24.1 (*)    MCHC 29.7 (*)    RDW 15.6 (*)    All other components within normal limits  SURGICAL PCR SCREEN  TYPE AND SCREEN     EKG: 01/19/20: Normal sinus rhythm Normal ECG No significant change since last tracing Confirmed by [place] ([id number]) on 01/19/2020 1:46:05 PM   CV: Carotid US 07/20/17 (Report in [facility]/PACS) IMPRESSION: Less than 50% stenosis in the right and left internal carotid arteries.   Nuclear stress test 06/29/13: Overall Impression: Normal stress nuclear study. LV Ejection Fraction: 66%. LV Wall Motion: NL LV Function; NL Wall Motion   Echo 06/28/13: Study Conclusions  Left ventricle: The cavity size was normal. Systolic  function was normal. The estimated ejection fraction was in  the range of  60% to 65%. Wall motion was normal; there were  no regional wall motion abnormalities. Doppler parameters  are consistent with abnormal left ventricular relaxation  (grade 1 diastolic dysfunction).    Past Medical History:  Diagnosis Date   Acid reflux    Allergic rhinitis    anesthetic complication    Respiratory arrest 02/22/2019   Anxiety     PT'S SON DIED Jun 22, 2010, husband died in June 22, 2019, Mother died in Sep 22, 2019   Arthritis    RA AND OA   Borderline hypercholesterolemia    on medication for TIA   Bronchitis    CHRONIC    Cancer ([place])    melanoma - face    Colon polyp    Environmental allergies    GERD (gastroesophageal reflux disease)    on PPI and carafte prn   H/O hiatal hernia    History of COVID-19 Feb 22, 2019   History of kidney stones    Hypertension    IBS (irritable bowel syndrome)    LBP (low back pain)    OA (osteoarthritis)    Left Shoulder   Pain    RIGHT KNEE - TORN MENISCUS AND ACL   Pneumonia    X 3  - LAST TIME WAS 2011/06/22   PONV (postoperative nausea and vomiting) 09-2012   sore throat after ankle surgery 6 yrs ago and severe ponv 09-2012   Pre-diabetes    patient denies - states it was "years ago and no follow up"   Pulmonary nodule seen on imaging study    Shortness of breath    with activity,bronchitis   Stroke ([place]) 03/2011   TIA--PT EXPERIENCED NUMBNESS RT HAND AND ARM , HEADACHE AND NAUSEA. NO RESIDUAL PROBLEMS    Past Surgical History:  Procedure Laterality Date   ANKLE SURGERY  ~10 years ago   left   BACK SURGERY  06/21/09   lower   CARPAL TUNNEL RELEASE Right Jun 22, 2019   CHOLECYSTECTOMY  ~15 years ago   COLONOSCOPY  22-Jun-2003, 06/22/11   HARDWARE REMOVAL N/A 04/07/2016   Procedure: HARDWARE REMOVAL;  Surgeon: Kary Kos, MD;  Location: [facility];  Service: Neurosurgery;  Laterality: N/A;   HARVEST BONE GRAFT N/A 07/05/2014   Procedure: HARVEST ILIAC BONE GRAFT;  Surgeon: Kary Kos, MD;  Location: [place] [facility];  Service: Neurosurgery;  Laterality: N/A;   KNEE ARTHROSCOPY Right 10/13/2012   Procedure: RIGHT KNEE ARTHROSCOPY WITH DEBRIDEMENT, CHONDROPLASTY;  Surgeon: Gearlean Alf, MD;  Location: [facility];  Service: Orthopedics;  Laterality: Right;   KNEE ARTHROSCOPY Right 03/02/2013   Procedure: RIGHT ARTHROSCOPY KNEE WITH MEDIAL MENISCAL  DEBRIDEMENT;  Surgeon: Gearlean Alf, MD;  Location: [facility];  Service: Orthopedics;  Laterality: Right;   LUMBAR FUSION  07/05/2014   LUMBAR WOUND DEBRIDEMENT N/A 01/11/2014   Procedure: Irrigation and Debridement Lumbar Wound for hematoma;  Surgeon: Elaina Hoops, MD;  Location: [place] [facility];  Service: Neurosurgery;  Laterality: N/A;   MAXIMUM ACCESS (MAS)POSTERIOR LUMBAR INTERBODY FUSION (PLIF) 1 LEVEL N/A 12/05/2013   Procedure: FOR MAXIMUM ACCESS (MAS) POSTERIOR LUMBAR INTERBODY FUSION (PLIF)LUMBAR FIVE-SACRAL-ONE,REMOVAL HARDWARE LUMBAR FOUR-FIVE;  Surgeon: Elaina Hoops, MD;  Location: [place] [facility];  Service: Neurosurgery;  Laterality: N/A;   Mohr's procedure  02/2015   face   Right Hip arthroplasty     10/01/16 Dr. Wynelle Link   ROOT CANAL     SHOULDER SURGERY Left ~14 years ago   "scope"   thumb surgery Left ~14 years ago  x2   TOTAL HIP ARTHROPLASTY Left 04/25/2015   Procedure: LEFT TOTAL HIP ARTHROPLASTY ANTERIOR APPROACH;  Surgeon: Gaynelle Arabian, MD;  Location[facility];  Service: Orthopedics;  Laterality: Left;   TOTAL HIP ARTHROPLASTY Right 10/01/2016   Procedure: RIGHT TOTAL HIP ARTHROPLASTY ANTERIOR APPROACH;  Surgeon: Gaynelle Arabian, MD;  Location: [facility];  Service: Orthopedics;  Laterality: Right;   TOTAL KNEE ARTHROPLASTY Right 07/25/2013   Procedure: RIGHT TOTAL KNEE ARTHROPLASTY;  Surgeon: Gearlean Alf, MD;  Location[facility];  Service: Orthopedics;  Laterality: Right;   TOTAL KNEE ARTHROPLASTY WITH REVISION COMPONENTS Right 05/28/2016   Procedure: RIGHT TIBIAL POLYETHYLENE KNEE REVISION;  Surgeon: Gaynelle Arabian, MD;  Location: [facility];  Service: Orthopedics;  Laterality: Right;  requests 28mins   TRIGGER FINGER RELEASE  ~6 years ago   X2 ON RIGHT HAND   TUBAL LIGATION      MEDICATIONS:  ALPRAZolam (XANAX) 1 MG tablet   amLODipine (NORVASC) 5 MG tablet   APPLE CIDER VINEGAR PO   ascorbic acid (VITAMIN C) 500 MG tablet   atorvastatin (LIPITOR) 20 MG tablet   cetirizine (ZYRTEC) 10 MG  tablet   Chlorpheniramine-PSE-Ibuprofen (ADVIL ALLERGY SINUS) 2-30-200 MG TABS   Cobalamin Combinations (NEURIVA PLUS) CAPS   esomeprazole (NEXIUM) 20 MG capsule   gabapentin (NEURONTIN) 300 MG capsule   hydroxychloroquine (PLAQUENIL) 200 MG tablet   leflunomide (ARAVA) 20 MG tablet   lisinopril (PRINIVIL,ZESTRIL) 40 MG tablet   Multiple Vitamins-Minerals (HAIR/SKIN/NAILS) CAPS   Multiple Vitamins-Minerals (MULTIVITAMIN WITH MINERALS) tablet   nortriptyline (PAMELOR) 25 MG capsule   OVER THE COUNTER MEDICATION   predniSONE (DELTASONE) 5 MG tablet   sucralfate (CARAFATE) 1 G tablet   vitamin B-12 (CYANOCOBALAMIN) 1000 MCG tablet   No current facility-administered medications for this encounter.    Myra Gianotti, PA-C Surgical Short Stay/Anesthesiology [facility] Phone [phone] [facility] Phone [phone] 01/20/2020 12:14 PM

## 2020-01-23 ENCOUNTER — Other Ambulatory Visit: Payer: Self-pay

## 2020-01-23 ENCOUNTER — Inpatient Hospital Stay (HOSPITAL_COMMUNITY)
Admission: RE | Admit: 2020-01-23 | Discharge: 2020-01-30 | DRG: 458 | Disposition: A | Discharge status: Home Health | Payer: Medicare Other | Attending: Neurosurgery | Admitting: Neurosurgery

## 2020-01-23 ENCOUNTER — Inpatient Hospital Stay (HOSPITAL_COMMUNITY): Payer: Medicare Other | Admitting: Physician Assistant

## 2020-01-23 ENCOUNTER — Encounter (HOSPITAL_COMMUNITY)
Admission: RE | Disposition: A | Discharge status: Home Health | Payer: Self-pay | Source: Home / Self Care | Attending: Neurosurgery

## 2020-01-23 ENCOUNTER — Inpatient Hospital Stay (HOSPITAL_COMMUNITY): Payer: Medicare Other

## 2020-01-23 ENCOUNTER — Encounter (HOSPITAL_COMMUNITY): Payer: Self-pay | Admitting: Neurosurgery

## 2020-01-23 ENCOUNTER — Inpatient Hospital Stay (HOSPITAL_COMMUNITY): Payer: Medicare Other | Admitting: Anesthesiology

## 2020-01-23 DIAGNOSIS — K219 Gastro-esophageal reflux disease without esophagitis: Secondary | ICD-10-CM | POA: Diagnosis present

## 2020-01-23 DIAGNOSIS — R000 Tachycardia, unspecified: Secondary | ICD-10-CM | POA: Diagnosis not present

## 2020-01-23 DIAGNOSIS — M532X6 Spinal instabilities, lumbar region: Secondary | ICD-10-CM | POA: Diagnosis present

## 2020-01-23 DIAGNOSIS — F419 Anxiety disorder, unspecified: Secondary | ICD-10-CM | POA: Diagnosis present

## 2020-01-23 DIAGNOSIS — Z885 Allergy status to narcotic agent status: Secondary | ICD-10-CM

## 2020-01-23 DIAGNOSIS — Z79899 Other long term (current) drug therapy: Secondary | ICD-10-CM | POA: Diagnosis not present

## 2020-01-23 DIAGNOSIS — E7800 Pure hypercholesterolemia, unspecified: Secondary | ICD-10-CM | POA: Diagnosis present

## 2020-01-23 DIAGNOSIS — M40209 Unspecified kyphosis, site unspecified: Secondary | ICD-10-CM | POA: Diagnosis present

## 2020-01-23 DIAGNOSIS — Z801 Family history of malignant neoplasm of trachea, bronchus and lung: Secondary | ICD-10-CM

## 2020-01-23 DIAGNOSIS — Z87442 Personal history of urinary calculi: Secondary | ICD-10-CM

## 2020-01-23 DIAGNOSIS — Z9049 Acquired absence of other specified parts of digestive tract: Secondary | ICD-10-CM | POA: Diagnosis not present

## 2020-01-23 DIAGNOSIS — Z96651 Presence of right artificial knee joint: Secondary | ICD-10-CM | POA: Diagnosis present

## 2020-01-23 DIAGNOSIS — Z8249 Family history of ischemic heart disease and other diseases of the circulatory system: Secondary | ICD-10-CM | POA: Diagnosis not present

## 2020-01-23 DIAGNOSIS — K589 Irritable bowel syndrome without diarrhea: Secondary | ICD-10-CM | POA: Diagnosis present

## 2020-01-23 DIAGNOSIS — Z981 Arthrodesis status: Secondary | ICD-10-CM

## 2020-01-23 DIAGNOSIS — R5383 Other fatigue: Secondary | ICD-10-CM | POA: Diagnosis not present

## 2020-01-23 DIAGNOSIS — Z8719 Personal history of other diseases of the digestive system: Secondary | ICD-10-CM | POA: Diagnosis not present

## 2020-01-23 DIAGNOSIS — Z7952 Long term (current) use of systemic steroids: Secondary | ICD-10-CM

## 2020-01-23 DIAGNOSIS — Z96641 Presence of right artificial hip joint: Secondary | ICD-10-CM | POA: Diagnosis present

## 2020-01-23 DIAGNOSIS — Z8616 Personal history of COVID-19: Secondary | ICD-10-CM | POA: Diagnosis not present

## 2020-01-23 DIAGNOSIS — I10 Essential (primary) hypertension: Secondary | ICD-10-CM | POA: Diagnosis present

## 2020-01-23 DIAGNOSIS — Z419 Encounter for procedure for purposes other than remedying health state, unspecified: Secondary | ICD-10-CM

## 2020-01-23 DIAGNOSIS — Z96642 Presence of left artificial hip joint: Secondary | ICD-10-CM | POA: Diagnosis present

## 2020-01-23 DIAGNOSIS — Z8673 Personal history of transient ischemic attack (TIA), and cerebral infarction without residual deficits: Secondary | ICD-10-CM

## 2020-01-23 DIAGNOSIS — Z825 Family history of asthma and other chronic lower respiratory diseases: Secondary | ICD-10-CM | POA: Diagnosis not present

## 2020-01-23 DIAGNOSIS — D509 Iron deficiency anemia, unspecified: Secondary | ICD-10-CM | POA: Diagnosis present

## 2020-01-23 DIAGNOSIS — Z85820 Personal history of malignant melanoma of skin: Secondary | ICD-10-CM | POA: Diagnosis not present

## 2020-01-23 DIAGNOSIS — R42 Dizziness and giddiness: Secondary | ICD-10-CM | POA: Diagnosis not present

## 2020-01-23 DIAGNOSIS — S32009K Unspecified fracture of unspecified lumbar vertebra, subsequent encounter for fracture with nonunion: Secondary | ICD-10-CM | POA: Diagnosis present

## 2020-01-23 DIAGNOSIS — R200 Anesthesia of skin: Secondary | ICD-10-CM | POA: Diagnosis present

## 2020-01-23 DIAGNOSIS — Z888 Allergy status to other drugs, medicaments and biological substances status: Secondary | ICD-10-CM | POA: Diagnosis not present

## 2020-01-23 DIAGNOSIS — D649 Anemia, unspecified: Secondary | ICD-10-CM | POA: Diagnosis not present

## 2020-01-23 DIAGNOSIS — R109 Unspecified abdominal pain: Secondary | ICD-10-CM

## 2020-01-23 HISTORY — PX: LAMINECTOMY WITH POSTERIOR LATERAL ARTHRODESIS LEVEL 4: SHX6338

## 2020-01-23 SURGERY — LAMINECTOMY WITH POSTERIOR LATERAL ARTHRODESIS LEVEL 4
Anesthesia: General | Site: Back

## 2020-01-23 MED ORDER — ACETAMINOPHEN 325 MG PO TABS
650.0000 mg | ORAL_TABLET | ORAL | Status: DC | PRN
  Administered 2020-01-23 – 2020-01-29 (×4): 650 mg via ORAL
  Filled 2020-01-23 (×3): qty 2

## 2020-01-23 MED ORDER — LACTATED RINGERS IV SOLN: INTRAVENOUS | Status: DC | PRN

## 2020-01-23 MED ORDER — ROCURONIUM BROMIDE 10 MG/ML (PF) SYRINGE
PREFILLED_SYRINGE | INTRAVENOUS | Status: AC
  Filled 2020-01-23: qty 10

## 2020-01-23 MED ORDER — VITAMIN B-12 1000 MCG PO TABS
1000.0000 ug | ORAL_TABLET | Freq: Every day | ORAL | Status: DC
  Administered 2020-01-23 – 2020-01-30 (×8): 1000 ug via ORAL
  Filled 2020-01-23 (×8): qty 1

## 2020-01-23 MED ORDER — PROPOFOL 10 MG/ML IV BOLUS
INTRAVENOUS | Status: DC | PRN
  Administered 2020-01-23: 150 mg via INTRAVENOUS

## 2020-01-23 MED ORDER — SODIUM CHLORIDE 0.9 % IV SOLN
INTRAVENOUS | Status: DC | PRN
  Administered 2020-01-23: 100 ug via INTRAVENOUS

## 2020-01-23 MED ORDER — AMLODIPINE BESYLATE 5 MG PO TABS
5.0000 mg | ORAL_TABLET | Freq: Every day | ORAL | Status: DC
  Administered 2020-01-24 – 2020-01-30 (×6): 5 mg via ORAL
  Filled 2020-01-23 (×7): qty 1

## 2020-01-23 MED ORDER — ONDANSETRON HCL 4 MG/2ML IJ SOLN
INTRAMUSCULAR | Status: AC
  Filled 2020-01-23: qty 2

## 2020-01-23 MED ORDER — CYCLOBENZAPRINE HCL 10 MG PO TABS
ORAL_TABLET | ORAL | Status: AC
  Filled 2020-01-23: qty 1

## 2020-01-23 MED ORDER — HYDROMORPHONE HCL 1 MG/ML IJ SOLN
INTRAMUSCULAR | Status: AC
  Filled 2020-01-23: qty 1

## 2020-01-23 MED ORDER — ALBUMIN HUMAN 5 % IV SOLN: INTRAVENOUS | Status: DC | PRN

## 2020-01-23 MED ORDER — EPHEDRINE SULFATE 50 MG/ML IJ SOLN
INTRAMUSCULAR | Status: DC | PRN
  Administered 2020-01-23: 10 mg via INTRAVENOUS
  Administered 2020-01-23: 5 mg via INTRAVENOUS
  Administered 2020-01-23: 10 mg via INTRAVENOUS

## 2020-01-23 MED ORDER — CHLORHEXIDINE GLUCONATE CLOTH 2 % EX PADS: 6.0000 | MEDICATED_PAD | Freq: Once | CUTANEOUS | Status: DC

## 2020-01-23 MED ORDER — LEFLUNOMIDE 20 MG PO TABS
20.0000 mg | ORAL_TABLET | Freq: Every day | ORAL | Status: DC
  Administered 2020-01-24 – 2020-01-30 (×7): 20 mg via ORAL
  Filled 2020-01-23 (×8): qty 1

## 2020-01-23 MED ORDER — ASCORBIC ACID 500 MG PO TABS
500.0000 mg | ORAL_TABLET | Freq: Every day | ORAL | Status: DC
  Administered 2020-01-23 – 2020-01-30 (×8): 500 mg via ORAL
  Filled 2020-01-23 (×8): qty 1

## 2020-01-23 MED ORDER — LORATADINE 10 MG PO TABS
10.0000 mg | ORAL_TABLET | Freq: Every day | ORAL | Status: DC
  Administered 2020-01-24 – 2020-01-30 (×7): 10 mg via ORAL
  Filled 2020-01-23 (×7): qty 1

## 2020-01-23 MED ORDER — HYDROMORPHONE HCL 2 MG PO TABS
4.0000 mg | ORAL_TABLET | ORAL | Status: DC | PRN
  Administered 2020-01-24 – 2020-01-30 (×21): 4 mg via ORAL
  Filled 2020-01-23 (×21): qty 2

## 2020-01-23 MED ORDER — ACETAMINOPHEN 10 MG/ML IV SOLN
INTRAVENOUS | Status: AC
  Filled 2020-01-23: qty 100

## 2020-01-23 MED ORDER — CEFAZOLIN SODIUM-DEXTROSE 2-4 GM/100ML-% IV SOLN
INTRAVENOUS | Status: AC
  Filled 2020-01-23: qty 100

## 2020-01-23 MED ORDER — PHENOL 1.4 % MT LIQD: 1.0000 | OROMUCOSAL | Status: DC | PRN

## 2020-01-23 MED ORDER — PANTOPRAZOLE SODIUM 40 MG IV SOLR: 40.0000 mg | Freq: Every day | INTRAVENOUS | Status: DC

## 2020-01-23 MED ORDER — NORTRIPTYLINE HCL 25 MG PO CAPS
25.0000 mg | ORAL_CAPSULE | Freq: Every day | ORAL | Status: DC
  Administered 2020-01-23 – 2020-01-29 (×7): 25 mg via ORAL
  Filled 2020-01-23 (×8): qty 1

## 2020-01-23 MED ORDER — ADULT MULTIVITAMIN W/MINERALS CH
1.0000 | ORAL_TABLET | Freq: Every day | ORAL | Status: DC
  Administered 2020-01-23 – 2020-01-30 (×8): 1 via ORAL
  Filled 2020-01-23 (×8): qty 1

## 2020-01-23 MED ORDER — ONDANSETRON HCL 4 MG/2ML IJ SOLN
INTRAMUSCULAR | Status: DC | PRN
  Administered 2020-01-23: 4 mg via INTRAVENOUS

## 2020-01-23 MED ORDER — FENTANYL CITRATE (PF) 100 MCG/2ML IJ SOLN
INTRAMUSCULAR | Status: AC
  Filled 2020-01-23: qty 2

## 2020-01-23 MED ORDER — CHLORPHENIRAMINE-PSE-IBUPROFEN 2-30-200 MG PO TABS: 1.0000 | ORAL_TABLET | Freq: Every day | ORAL | Status: DC | PRN

## 2020-01-23 MED ORDER — ATORVASTATIN CALCIUM 10 MG PO TABS
20.0000 mg | ORAL_TABLET | Freq: Every day | ORAL | Status: DC
  Administered 2020-01-24 – 2020-01-30 (×7): 20 mg via ORAL
  Filled 2020-01-23 (×7): qty 2

## 2020-01-23 MED ORDER — ACETAMINOPHEN 500 MG PO TABS
1000.0000 mg | ORAL_TABLET | Freq: Once | ORAL | Status: AC
  Administered 2020-01-23: 1000 mg via ORAL
  Filled 2020-01-23: qty 2

## 2020-01-23 MED ORDER — MIDAZOLAM HCL 5 MG/5ML IJ SOLN
INTRAMUSCULAR | Status: DC | PRN
  Administered 2020-01-23: 2 mg via INTRAVENOUS

## 2020-01-23 MED ORDER — ONDANSETRON HCL 4 MG/2ML IJ SOLN
4.0000 mg | Freq: Four times a day (QID) | INTRAMUSCULAR | Status: DC | PRN
  Administered 2020-01-23 – 2020-01-29 (×5): 4 mg via INTRAVENOUS
  Filled 2020-01-23 (×3): qty 2

## 2020-01-23 MED ORDER — DEXAMETHASONE SODIUM PHOSPHATE 10 MG/ML IJ SOLN
INTRAMUSCULAR | Status: AC
  Filled 2020-01-23: qty 1

## 2020-01-23 MED ORDER — ROCURONIUM BROMIDE 10 MG/ML (PF) SYRINGE
PREFILLED_SYRINGE | INTRAVENOUS | Status: DC | PRN
  Administered 2020-01-23 (×2): 50 mg via INTRAVENOUS
  Administered 2020-01-23: 20 mg via INTRAVENOUS

## 2020-01-23 MED ORDER — MIDAZOLAM HCL 2 MG/2ML IJ SOLN
INTRAMUSCULAR | Status: AC
  Filled 2020-01-23: qty 2

## 2020-01-23 MED ORDER — CHLORHEXIDINE GLUCONATE CLOTH 2 % EX PADS
6.0000 | MEDICATED_PAD | Freq: Every day | CUTANEOUS | Status: DC
  Administered 2020-01-24 – 2020-01-30 (×5): 6 via TOPICAL

## 2020-01-23 MED ORDER — AMISULPRIDE (ANTIEMETIC) 5 MG/2ML IV SOLN
10.0000 mg | Freq: Once | INTRAVENOUS | Status: AC | PRN
  Administered 2020-01-23: 10 mg via INTRAVENOUS

## 2020-01-23 MED ORDER — SODIUM CHLORIDE 0.9 % IV SOLN: 250.0000 mL | INTRAVENOUS | Status: DC

## 2020-01-23 MED ORDER — THROMBIN 20000 UNITS EX SOLR
CUTANEOUS | Status: DC | PRN
  Administered 2020-01-23: 20 mL via TOPICAL

## 2020-01-23 MED ORDER — PREDNISONE 5 MG PO TABS
5.0000 mg | ORAL_TABLET | Freq: Every day | ORAL | Status: DC
  Administered 2020-01-23 – 2020-01-30 (×8): 5 mg via ORAL
  Filled 2020-01-23 (×8): qty 1

## 2020-01-23 MED ORDER — HYDROMORPHONE HCL 1 MG/ML IJ SOLN
0.5000 mg | INTRAMUSCULAR | Status: DC | PRN
  Administered 2020-01-23 – 2020-01-29 (×7): 0.5 mg via INTRAVENOUS
  Filled 2020-01-23 (×4): qty 1

## 2020-01-23 MED ORDER — KETAMINE HCL 10 MG/ML IJ SOLN
INTRAMUSCULAR | Status: DC | PRN
  Administered 2020-01-23 (×3): 10 mg via INTRAVENOUS
  Administered 2020-01-23: 20 mg via INTRAVENOUS

## 2020-01-23 MED ORDER — CYCLOBENZAPRINE HCL 10 MG PO TABS
10.0000 mg | ORAL_TABLET | Freq: Three times a day (TID) | ORAL | Status: DC | PRN
  Administered 2020-01-23 – 2020-01-29 (×8): 10 mg via ORAL
  Filled 2020-01-23 (×6): qty 1

## 2020-01-23 MED ORDER — SODIUM CHLORIDE 0.9% FLUSH
3.0000 mL | Freq: Two times a day (BID) | INTRAVENOUS | Status: DC
  Administered 2020-01-24 – 2020-01-30 (×11): 3 mL via INTRAVENOUS

## 2020-01-23 MED ORDER — LIDOCAINE-EPINEPHRINE 1 %-1:100000 IJ SOLN
INTRAMUSCULAR | Status: AC
  Filled 2020-01-23: qty 1

## 2020-01-23 MED ORDER — ACETAMINOPHEN 160 MG/5ML PO SOLN
ORAL | Status: AC
  Filled 2020-01-23: qty 20.3

## 2020-01-23 MED ORDER — LISINOPRIL 20 MG PO TABS
40.0000 mg | ORAL_TABLET | Freq: Every day | ORAL | Status: DC
  Administered 2020-01-23 – 2020-01-30 (×6): 40 mg via ORAL
  Filled 2020-01-23 (×3): qty 2
  Filled 2020-01-23: qty 1
  Filled 2020-01-23 (×4): qty 2

## 2020-01-23 MED ORDER — CHLORHEXIDINE GLUCONATE 0.12 % MT SOLN
15.0000 mL | Freq: Once | OROMUCOSAL | Status: AC
  Administered 2020-01-23: 15 mL via OROMUCOSAL
  Filled 2020-01-23: qty 15

## 2020-01-23 MED ORDER — GABAPENTIN 600 MG PO TABS
600.0000 mg | ORAL_TABLET | Freq: Three times a day (TID) | ORAL | Status: DC
  Administered 2020-01-23 – 2020-01-30 (×21): 600 mg via ORAL
  Filled 2020-01-23 (×22): qty 1

## 2020-01-23 MED ORDER — ORAL CARE MOUTH RINSE: 15.0000 mL | Freq: Once | OROMUCOSAL | Status: AC

## 2020-01-23 MED ORDER — PROPOFOL 10 MG/ML IV BOLUS
INTRAVENOUS | Status: AC
  Filled 2020-01-23: qty 20

## 2020-01-23 MED ORDER — HYDROXYCHLOROQUINE SULFATE 200 MG PO TABS
200.0000 mg | ORAL_TABLET | Freq: Every day | ORAL | Status: DC
  Administered 2020-01-24 – 2020-01-30 (×7): 200 mg via ORAL
  Filled 2020-01-23 (×8): qty 1

## 2020-01-23 MED ORDER — BUPIVACAINE LIPOSOME 1.3 % IJ SUSP
20.0000 mL | INTRAMUSCULAR | Status: AC
  Administered 2020-01-23: 20 mL
  Filled 2020-01-23: qty 20

## 2020-01-23 MED ORDER — FENTANYL CITRATE (PF) 100 MCG/2ML IJ SOLN
25.0000 ug | INTRAMUSCULAR | Status: AC | PRN
  Administered 2020-01-23 (×6): 25 ug via INTRAVENOUS

## 2020-01-23 MED ORDER — AMISULPRIDE (ANTIEMETIC) 5 MG/2ML IV SOLN
INTRAVENOUS | Status: AC
  Filled 2020-01-23: qty 4

## 2020-01-23 MED ORDER — NEURIVA PLUS PO CAPS: 1.0000 | ORAL_CAPSULE | Freq: Every day | ORAL | Status: DC

## 2020-01-23 MED ORDER — SODIUM CHLORIDE 0.9% FLUSH: 3.0000 mL | INTRAVENOUS | Status: DC | PRN

## 2020-01-23 MED ORDER — ALPRAZOLAM 0.5 MG PO TABS
1.0000 mg | ORAL_TABLET | Freq: Two times a day (BID) | ORAL | Status: DC | PRN
  Administered 2020-01-23 – 2020-01-29 (×7): 1 mg via ORAL
  Filled 2020-01-23 (×7): qty 2

## 2020-01-23 MED ORDER — LACTATED RINGERS IV SOLN: INTRAVENOUS | Status: DC

## 2020-01-23 MED ORDER — APPLE CIDER VINEGAR 500 MG PO TABS: ORAL_TABLET | Freq: Every day | ORAL | Status: DC

## 2020-01-23 MED ORDER — CEFAZOLIN SODIUM-DEXTROSE 2-4 GM/100ML-% IV SOLN
2.0000 g | Freq: Three times a day (TID) | INTRAVENOUS | Status: AC
  Administered 2020-01-23 – 2020-01-25 (×6): 2 g via INTRAVENOUS
  Filled 2020-01-23 (×5): qty 100

## 2020-01-23 MED ORDER — SUCRALFATE 1 G PO TABS
1.0000 g | ORAL_TABLET | Freq: Three times a day (TID) | ORAL | Status: DC | PRN
  Administered 2020-01-24 – 2020-01-30 (×3): 1 g via ORAL
  Filled 2020-01-23 (×4): qty 1

## 2020-01-23 MED ORDER — ACETAMINOPHEN 650 MG RE SUPP: 650.0000 mg | RECTAL | Status: DC | PRN

## 2020-01-23 MED ORDER — FENTANYL CITRATE (PF) 100 MCG/2ML IJ SOLN
INTRAMUSCULAR | Status: DC | PRN
  Administered 2020-01-23: 50 ug via INTRAVENOUS
  Administered 2020-01-23: 25 ug via INTRAVENOUS
  Administered 2020-01-23: 50 ug via INTRAVENOUS
  Administered 2020-01-23: 100 ug via INTRAVENOUS
  Administered 2020-01-23: 25 ug via INTRAVENOUS

## 2020-01-23 MED ORDER — MENTHOL 3 MG MT LOZG: 1.0000 | LOZENGE | OROMUCOSAL | Status: DC | PRN

## 2020-01-23 MED ORDER — CEFAZOLIN SODIUM-DEXTROSE 2-4 GM/100ML-% IV SOLN
2.0000 g | INTRAVENOUS | Status: AC
  Administered 2020-01-23: 2 g via INTRAVENOUS
  Filled 2020-01-23: qty 100

## 2020-01-23 MED ORDER — KETAMINE HCL 50 MG/5ML IJ SOSY
PREFILLED_SYRINGE | INTRAMUSCULAR | Status: AC
  Filled 2020-01-23: qty 5

## 2020-01-23 MED ORDER — HAIR/SKIN/NAILS PO CAPS: 1.0000 | ORAL_CAPSULE | Freq: Every day | ORAL | Status: DC

## 2020-01-23 MED ORDER — THROMBIN 20000 UNITS EX SOLR
CUTANEOUS | Status: AC
  Filled 2020-01-23: qty 20000

## 2020-01-23 MED ORDER — DEXMEDETOMIDINE (PRECEDEX) IN NS 20 MCG/5ML (4 MCG/ML) IV SYRINGE
PREFILLED_SYRINGE | INTRAVENOUS | Status: DC | PRN
  Administered 2020-01-23: 8 ug via INTRAVENOUS
  Administered 2020-01-23 (×3): 4 ug via INTRAVENOUS

## 2020-01-23 MED ORDER — LIDOCAINE 2% (20 MG/ML) 5 ML SYRINGE
INTRAMUSCULAR | Status: AC
  Filled 2020-01-23: qty 5

## 2020-01-23 MED ORDER — PANTOPRAZOLE SODIUM 40 MG PO TBEC
40.0000 mg | DELAYED_RELEASE_TABLET | Freq: Every day | ORAL | Status: DC
  Administered 2020-01-24 – 2020-01-30 (×7): 40 mg via ORAL
  Filled 2020-01-23 (×7): qty 1

## 2020-01-23 MED ORDER — LIDOCAINE 2% (20 MG/ML) 5 ML SYRINGE
INTRAMUSCULAR | Status: DC | PRN
  Administered 2020-01-23: 60 mg via INTRAVENOUS

## 2020-01-23 MED ORDER — 0.9 % SODIUM CHLORIDE (POUR BTL) OPTIME
TOPICAL | Status: DC | PRN
  Administered 2020-01-23: 1000 mL

## 2020-01-23 MED ORDER — ONDANSETRON HCL 4 MG PO TABS: 4.0000 mg | ORAL_TABLET | Freq: Four times a day (QID) | ORAL | Status: DC | PRN

## 2020-01-23 MED ORDER — DEXAMETHASONE SODIUM PHOSPHATE 10 MG/ML IJ SOLN
10.0000 mg | Freq: Once | INTRAMUSCULAR | Status: AC
  Administered 2020-01-23: 10 mg via INTRAVENOUS
  Filled 2020-01-23: qty 1

## 2020-01-23 MED ORDER — PHENYLEPHRINE HCL-NACL 10-0.9 MG/250ML-% IV SOLN
INTRAVENOUS | Status: DC | PRN
  Administered 2020-01-23: 50 ug/min via INTRAVENOUS

## 2020-01-23 MED ORDER — FENTANYL CITRATE (PF) 250 MCG/5ML IJ SOLN
INTRAMUSCULAR | Status: AC
  Filled 2020-01-23: qty 5

## 2020-01-23 MED ORDER — ACETAMINOPHEN 325 MG PO TABS
ORAL_TABLET | ORAL | Status: AC
  Filled 2020-01-23: qty 2

## 2020-01-23 MED ORDER — LIDOCAINE-EPINEPHRINE 1 %-1:100000 IJ SOLN
INTRAMUSCULAR | Status: DC | PRN
  Administered 2020-01-23: 10 mL

## 2020-01-23 MED ORDER — ALUM & MAG HYDROXIDE-SIMETH 200-200-20 MG/5ML PO SUSP
30.0000 mL | Freq: Four times a day (QID) | ORAL | Status: DC | PRN
  Administered 2020-01-25: 30 mL via ORAL
  Filled 2020-01-23: qty 30

## 2020-01-23 SURGICAL SUPPLY — 80 items
ADH SKN CLS APL DERMABOND .7 (GAUZE/BANDAGES/DRESSINGS) ×1
ADH SKN CLS LQ APL DERMABOND (GAUZE/BANDAGES/DRESSINGS) ×2
APL SKNCLS STERI-STRIP NONHPOA (GAUZE/BANDAGES/DRESSINGS) ×1
BENZOIN TINCTURE PRP APPL 2/3 (GAUZE/BANDAGES/DRESSINGS) ×3 IMPLANT
BLADE CLIPPER SURG (BLADE) IMPLANT
BLADE SURG 11 STRL SS (BLADE) ×3 IMPLANT
BONE VIVIGEN FORMABLE 10CC (Bone Implant) ×3 IMPLANT
BUR MATCHSTICK NEURO 3.0 LAGG (BURR) ×3 IMPLANT
BUR PRECISION FLUTE 6.0 (BURR) ×3 IMPLANT
CANISTER SUCT 3000ML PPV (MISCELLANEOUS) ×3 IMPLANT
CAP LOCKING (Cap) ×30 IMPLANT
CAP LOCKING 5.5 CREO (Cap) IMPLANT
CARTRIDGE OIL MAESTRO DRILL (MISCELLANEOUS) ×1 IMPLANT
CLOSURE WOUND 1/2 X4 (GAUZE/BANDAGES/DRESSINGS) ×2
CNTNR URN SCR LID CUP LEK RST (MISCELLANEOUS) ×1 IMPLANT
CONT SPEC 4OZ STRL OR WHT (MISCELLANEOUS) ×3
COVER BACK TABLE 24X17X13 BIG (DRAPES) IMPLANT
COVER BACK TABLE 60X90IN (DRAPES) ×3 IMPLANT
COVER WAND RF STERILE (DRAPES) ×3 IMPLANT
DECANTER SPIKE VIAL GLASS SM (MISCELLANEOUS) ×3 IMPLANT
DERMABOND ADHESIVE PROPEN (GAUZE/BANDAGES/DRESSINGS) ×4
DERMABOND ADVANCED (GAUZE/BANDAGES/DRESSINGS) ×2
DERMABOND ADVANCED .7 DNX12 (GAUZE/BANDAGES/DRESSINGS) ×1 IMPLANT
DERMABOND ADVANCED .7 DNX6 (GAUZE/BANDAGES/DRESSINGS) IMPLANT
DIFFUSER DRILL AIR PNEUMATIC (MISCELLANEOUS) ×3 IMPLANT
DRAPE C-ARM 42X72 X-RAY (DRAPES) ×6 IMPLANT
DRAPE HALF SHEET 40X57 (DRAPES) IMPLANT
DRAPE LAPAROTOMY 100X72X124 (DRAPES) ×3 IMPLANT
DRAPE POUCH INSTRU U-SHP 10X18 (DRAPES) ×3 IMPLANT
DRAPE SURG 17X23 STRL (DRAPES) ×3 IMPLANT
DRSG OPSITE POSTOP 4X10 (GAUZE/BANDAGES/DRESSINGS) ×2 IMPLANT
DRSG OPSITE POSTOP 4X6 (GAUZE/BANDAGES/DRESSINGS) ×2 IMPLANT
DURAPREP 26ML APPLICATOR (WOUND CARE) ×3 IMPLANT
ELECT REM PT RETURN 9FT ADLT (ELECTROSURGICAL) ×3
ELECTRODE REM PT RTRN 9FT ADLT (ELECTROSURGICAL) ×1 IMPLANT
EVACUATOR 1/8 PVC DRAIN (DRAIN) ×2 IMPLANT
EVACUATOR 3/16  PVC DRAIN (DRAIN) ×3
EVACUATOR 3/16 PVC DRAIN (DRAIN) ×1 IMPLANT
GAUZE 4X4 16PLY RFD (DISPOSABLE) IMPLANT
GAUZE SPONGE 4X4 12PLY STRL (GAUZE/BANDAGES/DRESSINGS) ×3 IMPLANT
GLOVE BIO SURGEON STRL SZ7 (GLOVE) IMPLANT
GLOVE BIO SURGEON STRL SZ8 (GLOVE) ×6 IMPLANT
GLOVE BIOGEL PI IND STRL 7.0 (GLOVE) IMPLANT
GLOVE BIOGEL PI INDICATOR 7.0 (GLOVE)
GLOVE ECLIPSE 7.5 STRL STRAW (GLOVE) IMPLANT
GLOVE EXAM NITRILE XL STR (GLOVE) IMPLANT
GLOVE INDICATOR 8.5 STRL (GLOVE) ×6 IMPLANT
GOWN STRL REUS W/ TWL LRG LVL3 (GOWN DISPOSABLE) IMPLANT
GOWN STRL REUS W/ TWL XL LVL3 (GOWN DISPOSABLE) ×2 IMPLANT
GOWN STRL REUS W/TWL 2XL LVL3 (GOWN DISPOSABLE) IMPLANT
GOWN STRL REUS W/TWL LRG LVL3 (GOWN DISPOSABLE)
GOWN STRL REUS W/TWL XL LVL3 (GOWN DISPOSABLE) ×6
GRAFT BNE MATRIX VG FRMBL L 10 (Bone Implant) IMPLANT
GRAFT TRINITY ELITE LGE HUMAN (Tissue) ×2 IMPLANT
KIT BASIN OR (CUSTOM PROCEDURE TRAY) ×3 IMPLANT
KIT INFUSE LRG II (Orthopedic Implant) ×2 IMPLANT
KIT TURNOVER KIT B (KITS) ×3 IMPLANT
NDL HYPO 21X1.5 SAFETY (NEEDLE) IMPLANT
NDL HYPO 25X1 1.5 SAFETY (NEEDLE) ×1 IMPLANT
NEEDLE HYPO 21X1.5 SAFETY (NEEDLE) ×3 IMPLANT
NEEDLE HYPO 25X1 1.5 SAFETY (NEEDLE) ×3 IMPLANT
NS IRRIG 1000ML POUR BTL (IV SOLUTION) ×5 IMPLANT
OIL CARTRIDGE MAESTRO DRILL (MISCELLANEOUS) ×3
PACK LAMINECTOMY NEURO (CUSTOM PROCEDURE TRAY) ×3 IMPLANT
PAD ARMBOARD 7.5X6 YLW CONV (MISCELLANEOUS) ×9 IMPLANT
ROD 5.5 (Rod) ×4 IMPLANT
SCREW CREO 5.5X40 (Screw) ×16 IMPLANT
SPONGE LAP 4X18 RFD (DISPOSABLE) IMPLANT
SPONGE SURGIFOAM ABS GEL 100 (HEMOSTASIS) ×3 IMPLANT
STRIP CLOSURE SKIN 1/2X4 (GAUZE/BANDAGES/DRESSINGS) ×4 IMPLANT
SUT VIC AB 0 CT1 18XCR BRD8 (SUTURE) ×2 IMPLANT
SUT VIC AB 0 CT1 8-18 (SUTURE) ×9
SUT VIC AB 2-0 CT1 18 (SUTURE) ×5 IMPLANT
SUT VICRYL 4-0 PS2 18IN ABS (SUTURE) ×3 IMPLANT
SYR 20ML LL LF (SYRINGE) ×2 IMPLANT
TOWEL GREEN STERILE (TOWEL DISPOSABLE) ×3 IMPLANT
TOWEL GREEN STERILE FF (TOWEL DISPOSABLE) ×3 IMPLANT
TRAY FOLEY MTR SLVR 16FR STAT (SET/KITS/TRAYS/PACK) ×3 IMPLANT
TULIP CREP AMP 5.5MM (Orthopedic Implant) ×16 IMPLANT
WATER STERILE IRR 1000ML POUR (IV SOLUTION) ×3 IMPLANT

## 2020-01-23 NOTE — Anesthesia Procedure Notes (Signed)
Procedure Name: Intubation Date/Time: 01/23/2020 7:38 AM Performed by: Terrence Dupont, RN Pre-anesthesia Checklist: Patient identified, Emergency Drugs available, Suction available and Patient being monitored Patient Re-evaluated:Patient Re-evaluated prior to induction Oxygen Delivery Method: Circle System Utilized Preoxygenation: Pre-oxygenation with 100% oxygen Induction Type: IV induction Ventilation: Mask ventilation without difficulty Laryngoscope Size: Mac and 3 Grade View: Grade I Tube type: Oral Tube size: 7.0 mm Number of attempts: 1 Airway Equipment and Method: Stylet Placement Confirmation: ETT inserted through vocal cords under direct vision,  positive ETCO2 and breath sounds checked- equal and bilateral Secured at: 22 cm Tube secured with: Tape Dental Injury: Teeth and Oropharynx as per pre-operative assessment

## 2020-01-23 NOTE — Op Note (Signed)
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  Pedicle screw fixation T10-L4 utilizing pre-existing L4 screws new screws placed at T10, T11, T12, and L1 utilizing the globus Creo amp modular pedicle screw set  3.  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  Due to patient's progression of clinical syndrome imaging findings and failed conservative treatment I recommended exploration fusion move of hardware with pedicle screw fixation from T10 down to L4 there was no residual pedicles left at L2 to replace screws and 2 and she already had the screws removed from L3.  I extensively went over the risks and benefits of the operation with her as well as perioperative course expectations of outcome and alternatives of surgery and she understood and agreed to proceed forward.  Operative procedure: Patient was brought into the OR was due to general anesthesia positioned prone the Wilson frame her back was prepped and draped in routine sterile fashion her old incision was opened up and extended cephalad subperiosteal dissection was carried out on the lamina of T9 down into the scar tissue exposing the screw and rod construct from L2-L4.  There is extensive amount of heterotopic bone that over grew the L2 screw this was all but away with a Leksell rongeur to expose the screw heads.  The knots were then removed at both the L2 screws  and the L4 screws rod was then removed and the screws at L2 were removed they were noted be markedly loose.  The 3 4 fusion appear to be solid there was an extensive amount of heterotopic bone at 2 3 that look like the least look partially fused.  But we exposed all the TPs and pedicle screw entry points from T10 down through L1 and exposing the posterior lateral bone from the posterior lateral fusion from L1 all the way down to L4.  Then under AP and lateral fluoroscopy, I placed bilateral pedicle screws at T10, T11, T12, and L1 bilaterally.  Postop fluoroscopy confirmed good position of all the implants.  At this point I aggressively decorticated the TPs and posterior lateral fusion mass from L1 down to L3 I decorticated the pars and lateral TP and facet complexes from T10 down to L1 laden extensive mount of posterior lateral bone utilizing BMP, Trinity, and vivigen from T10 down through L3.  Then I assembled the heads on the screws advanced the screws fashioned to rods and anchored rods from T10 down to the L4 residual screw.  Everything was tightened down and anchored in place I placed a medium Hemovac drain injected Exparel in the fascia and closed the wound in layers with interrupted Vicryl in a running 4 subcuticular Dermabond benzoin Steri-Strips and a sterile dressing was applied patient recovery in stable condition.  At the end the case all needle counts and sponge counts were correct.

## 2020-01-23 NOTE — Anesthesia Postprocedure Evaluation (Signed)
Anesthesia Post Note  Patient: SHARUNDA SALMON  Procedure(s) Performed: Posterior lateral fusion - Thoracic Ten -Thoracic Eleven - Thoracic Eleven -Thoracic Twelve - Thoracic Twelve- Lumbar One - Lumbar One- Lumbar Two revision with removal globus (N/A Back)     Patient location during evaluation: PACU Anesthesia Type: General Level of consciousness: awake and alert Pain management: pain level controlled Vital Signs Assessment: post-procedure vital signs reviewed and stable Respiratory status: spontaneous breathing, nonlabored ventilation, respiratory function stable and patient connected to nasal cannula oxygen Cardiovascular status: blood pressure returned to baseline and stable Postop Assessment: no apparent nausea or vomiting Anesthetic complications: no   No complications documented.  Last Vitals:  Vitals:   01/23/20 1250 01/23/20 1300  BP: 120/71 120/67  Pulse: 71 70  Resp: 15 14  Temp:    SpO2: 100% 100%    Last Pain:  Vitals:   01/23/20 1235  TempSrc:   PainSc: 7                  Tiajuana Amass

## 2020-01-23 NOTE — Progress Notes (Signed)
Orthopedic Tech Progress Note Patient Details:  Angie Olson 08-Jul-1950 [id number] PACU RN said "patient daughter has brace" Patient ID: Angie Olson, female   DOB: 23-Aug-1950, 69 y.o.   MRN: [id number]   Angie Olson 01/23/2020, 3:56 PM

## 2020-01-23 NOTE — Progress Notes (Signed)
Attempted report x1. 

## 2020-01-23 NOTE — Transfer of Care (Signed)
Immediate Anesthesia Transfer of Care Note  Patient: Angie Olson  Procedure(s) Performed: Posterior lateral fusion - Thoracic Ten -Thoracic Eleven - Thoracic Eleven -Thoracic Twelve - Thoracic Twelve- Lumbar One - Lumbar One- Lumbar Two revision with removal globus (N/A Back)  Patient Location: PACU  Anesthesia Type:General  Level of Consciousness: drowsy and patient cooperative  Airway & Oxygen Therapy: Patient Spontanous Breathing and Patient connected to face mask oxygen  Post-op Assessment: Report given to RN and Post -op Vital signs reviewed and stable  Post vital signs: Reviewed and stable  Last Vitals:  Vitals Value Taken Time  BP 109/61 01/23/20 1136  Temp    Pulse 78 01/23/20 1138  Resp 16 01/23/20 1138  SpO2 100 % 01/23/20 1138  Vitals shown include unvalidated device data.  Last Pain:  Vitals:   01/23/20 0623  TempSrc:   PainSc: 4       Patients Stated Pain Goal: 3 (23/95/32 0233)  Complications: No complications documented.

## 2020-01-23 NOTE — H&P (Signed)
Angie Olson is an 69 y.o. female.   Chief Complaint: Back pain HPI: 69 year old female previously undergone lumbar fusion with extension patient started developing progressive worsening back pain loosening of her upper L2 screws and kyphosis.  Due to patient's progression of clinical syndrome and progression of deformity I recommended revision of spinal fusion and extension up into T10.  I extensively gone over the risks and benefits of a T10-L3 fusion posterior lateral arthrodesis.  I also reviewed the recommended perioperative course expectations of outcome and alternatives of surgery and she agrees to proceed forward.  Past Medical History:  Diagnosis Date  . Acid reflux   . Allergic rhinitis   . anesthetic complication    Respiratory arrest Feb 19, 2019  . Anxiety    PT'S SON DIED 2010-06-19, husband died in 2019-06-19, Mother died in 09-19-19  . Arthritis    RA AND OA  . Borderline hypercholesterolemia    on medication for TIA  . Bronchitis    CHRONIC   . Cancer ([place])    melanoma - face   . Colon polyp   . Environmental allergies   . GERD (gastroesophageal reflux disease)    on PPI and carafte prn  . H/O hiatal hernia   . History of COVID-19 02/19/2019  . History of kidney stones   . Hypertension   . IBS (irritable bowel syndrome)   . LBP (low back pain)   . OA (osteoarthritis)    Left Shoulder  . Pain    RIGHT KNEE - TORN MENISCUS AND ACL  . Pneumonia    X 3  - LAST TIME WAS 2011/06/19  . PONV (postoperative nausea and vomiting) 09-2012   sore throat after ankle surgery 6 yrs ago and severe ponv 09-2012  . Pre-diabetes    patient denies - states it was "years ago and no follow up"  . Pulmonary nodule seen on imaging study   . Shortness of breath    with activity,bronchitis  . Stroke ([place]) 03/2011   TIA--PT EXPERIENCED NUMBNESS RT HAND AND ARM , HEADACHE AND NAUSEA. NO RESIDUAL PROBLEMS    Past Surgical History:  Procedure Laterality Date  . ANKLE SURGERY  ~10 years ago    left  . BACK SURGERY  June 18, 2009   lower  . CARPAL TUNNEL RELEASE Right 06-19-19  . CHOLECYSTECTOMY  ~15 years ago  . COLONOSCOPY  0630,1601, 06/19/11  . HARDWARE REMOVAL N/A 04/07/2016   Procedure: HARDWARE REMOVAL;  Surgeon: Kary Kos, MD;  Location: [place];  Service: Neurosurgery;  Laterality: N/A;  . HARVEST BONE GRAFT N/A 07/05/2014   Procedure: HARVEST ILIAC BONE GRAFT;  Surgeon: Kary Kos, MD;  Location: [place] [facility];  Service: Neurosurgery;  Laterality: N/A;  . KNEE ARTHROSCOPY Right 10/13/2012   Procedure: RIGHT KNEE ARTHROSCOPY WITH DEBRIDEMENT, CHONDROPLASTY;  Surgeon: Gearlean Alf, MD;  Location: [facility];  Service: Orthopedics;  Laterality: Right;  . KNEE ARTHROSCOPY Right 03/02/2013   Procedure: RIGHT ARTHROSCOPY KNEE WITH MEDIAL MENISCAL  DEBRIDEMENT;  Surgeon: Gearlean Alf, MD;  Location: [facility];  Service: Orthopedics;  Laterality: Right;  . LUMBAR FUSION  07/05/2014  . LUMBAR WOUND DEBRIDEMENT N/A 01/11/2014   Procedure: Irrigation and Debridement Lumbar Wound for hematoma;  Surgeon: Elaina Hoops, MD;  Location: [place] [facility];  Service: Neurosurgery;  Laterality: N/A;  . MAXIMUM ACCESS (MAS)POSTERIOR LUMBAR INTERBODY FUSION (PLIF) 1 LEVEL N/A 12/05/2013   Procedure: FOR MAXIMUM ACCESS (MAS) POSTERIOR LUMBAR INTERBODY FUSION (PLIF)LUMBAR FIVE-SACRAL-ONE,REMOVAL HARDWARE  LUMBAR FOUR-FIVE;  Surgeon: Elaina Hoops, MD;  Location: [place] [facility];  Service: Neurosurgery;  Laterality: N/A;  . Mohr's procedure  02/2015   face  . Right Hip arthroplasty     10/01/16 Dr. Wynelle Link  . ROOT CANAL    . SHOULDER SURGERY Left ~14 years ago   "scope"  . thumb surgery Left ~14 years ago   x2  . TOTAL HIP ARTHROPLASTY Left 04/25/2015   Procedure: LEFT TOTAL HIP ARTHROPLASTY ANTERIOR APPROACH;  Surgeon: Gaynelle Arabian, MD;  Location: [facility];  Service: Orthopedics;  Laterality: Left;  . TOTAL HIP ARTHROPLASTY Right 10/01/2016   Procedure: RIGHT TOTAL HIP ARTHROPLASTY ANTERIOR APPROACH;  Surgeon: Gaynelle Arabian, MD;   Location: [facility];  Service: Orthopedics;  Laterality: Right;  . TOTAL KNEE ARTHROPLASTY Right 07/25/2013   Procedure: RIGHT TOTAL KNEE ARTHROPLASTY;  Surgeon: Gearlean Alf, MD;  Location: [facility];  Service: Orthopedics;  Laterality: Right;  . TOTAL KNEE ARTHROPLASTY WITH REVISION COMPONENTS Right 05/28/2016   Procedure: RIGHT TIBIAL POLYETHYLENE KNEE REVISION;  Surgeon: Gaynelle Arabian, MD;  Location: [facility];  Service: Orthopedics;  Laterality: Right;  requests 72mins  . TRIGGER FINGER RELEASE  ~6 years ago   X2 ON RIGHT HAND  . TUBAL LIGATION      Family History  Problem Relation Age of Onset  . Heart disease Sister   . Lung cancer Brother   . Heart disease Brother   . CAD Brother   . Heart attack Mother   . Emphysema Father   . Hypertension Daughter   . Colon cancer Neg Hx    Social History:  reports that she has never smoked. She has never used smokeless tobacco. She reports that she does not drink alcohol and does not use drugs.  Allergies:  Allergies  Allergen Reactions  . Methotrexate Derivatives Other (See Comments)    Causes recurrent infections  . Pseudoephedrine Other (See Comments)    Makes jittery   . Reglan [Metoclopramide] Other (See Comments)    insomnia  . Vicodin [Hydrocodone-Acetaminophen] Other (See Comments)    "Makes me feel weird"  . Other Other (See Comments)    Pollen / Closes Sinus  . Oxycodone Itching and Rash    Patient tolerated hydromorphone without reaction.    Medications Prior to Admission  Medication Sig Dispense Refill  . ALPRAZolam (XANAX) 1 MG tablet Take 1 mg by mouth 2 (two) times daily as needed for anxiety.     Marland Kitchen amLODipine (NORVASC) 5 MG tablet Take 5 mg by mouth daily.     . APPLE CIDER VINEGAR PO Take 1 tablet by mouth daily. [place]    . ascorbic acid (VITAMIN C) 500 MG tablet Take 500 mg by mouth daily.    Marland Kitchen atorvastatin (LIPITOR) 20 MG tablet Take 20 mg by mouth daily.     . cetirizine (ZYRTEC) 10 MG tablet Take 10 mg by mouth  daily.    . Chlorpheniramine-PSE-Ibuprofen (ADVIL ALLERGY SINUS) 2-30-200 MG TABS Take 1 tablet by mouth daily as needed (Sinus headache).    . Cobalamin Combinations (NEURIVA PLUS) CAPS Take 1 tablet by mouth daily.    Marland Kitchen esomeprazole (NEXIUM) 20 MG capsule Take 20 mg by mouth daily before breakfast.     . gabapentin (NEURONTIN) 300 MG capsule Take 600 mg by mouth 3 (three) times daily.     . hydroxychloroquine (PLAQUENIL) 200 MG tablet Take 200 mg by mouth daily.     Marland Kitchen leflunomide (ARAVA) 20 MG tablet  Take 20 mg by mouth daily.    Marland Kitchen lisinopril (PRINIVIL,ZESTRIL) 40 MG tablet Take 40 mg by mouth daily.    . Multiple Vitamins-Minerals (HAIR/SKIN/NAILS) CAPS Take 1 tablet by mouth daily.    . Multiple Vitamins-Minerals (MULTIVITAMIN WITH MINERALS) tablet Take 1 tablet by mouth daily. 50 plus    . nortriptyline (PAMELOR) 25 MG capsule Take 25 mg by mouth at bedtime.   2  . OVER THE COUNTER MEDICATION Take 1 capsule by mouth daily. Total Beets    . predniSONE (DELTASONE) 5 MG tablet Take 5 mg by mouth daily. Continuous for rheumatoid arthritis    . sucralfate (CARAFATE) 1 G tablet Take 1 g by mouth 3 (three) times daily as needed (indigestion).     . vitamin B-12 (CYANOCOBALAMIN) 1000 MCG tablet Take 1,000 mcg by mouth daily.      No results found for this or any previous visit (from the past 48 hour(s)). No results found.  Review of Systems  Musculoskeletal: Positive for back pain.  Neurological: Positive for numbness.    Blood pressure (!) 172/77, pulse 86, temperature 98.3 F (36.8 C), temperature source Oral, resp. rate 20, height 5\' 5"  (1.651 m), weight 78.1 kg, SpO2 96 %. Physical Exam HENT:     Head: Normocephalic.     Mouth/Throat:     Mouth: Mucous membranes are moist.  Eyes:     Pupils: Pupils are equal, round, and reactive to light.  Cardiovascular:     Rate and Rhythm: Normal rate.  Pulmonary:     Effort: Pulmonary effort is normal.  Abdominal:     General: Abdomen is  flat.  Musculoskeletal:        General: Normal range of motion.  Skin:    General: Skin is warm.  Neurological:     General: No focal deficit present.     Mental Status: She is alert.     Comments: Awake and alert strength is 5 and 5 iliopsoas, quads, hamstrings, gastrocs, his tibialis, and EHL.      Assessment/Plan 69 year old presents for T10 L3 revision of fusion  Elaina Hoops, MD 01/23/2020, 7:16 AM

## 2020-01-24 ENCOUNTER — Inpatient Hospital Stay (HOSPITAL_COMMUNITY): Payer: Medicare Other

## 2020-01-24 LAB — CBC WITH DIFFERENTIAL/PLATELET
Abs Immature Granulocytes: 0.07 10*3/uL (ref 0.00–0.07)
Basophils Absolute: 0 10*3/uL (ref 0.0–0.1)
Basophils Relative: 0 %
Eosinophils Absolute: 0 10*3/uL (ref 0.0–0.5)
Eosinophils Relative: 0 %
HCT: 26.7 % — ABNORMAL LOW (ref 36.0–46.0)
Hemoglobin: 8.1 g/dL — ABNORMAL LOW (ref 12.0–15.0)
Immature Granulocytes: 1 %
Lymphocytes Relative: 10 %
Lymphs Abs: 1.4 10*3/uL (ref 0.7–4.0)
MCH: 24.4 pg — ABNORMAL LOW (ref 26.0–34.0)
MCHC: 30.3 g/dL (ref 30.0–36.0)
MCV: 80.4 fL (ref 80.0–100.0)
Monocytes Absolute: 1.4 10*3/uL — ABNORMAL HIGH (ref 0.1–1.0)
Monocytes Relative: 10 %
Neutro Abs: 11.4 10*3/uL — ABNORMAL HIGH (ref 1.7–7.7)
Neutrophils Relative %: 79 %
Platelets: 378 10*3/uL (ref 150–400)
RBC: 3.32 MIL/uL — ABNORMAL LOW (ref 3.87–5.11)
RDW: 15.9 % — ABNORMAL HIGH (ref 11.5–15.5)
WBC: 14.3 10*3/uL — ABNORMAL HIGH (ref 4.0–10.5)
nRBC: 0 % (ref 0.0–0.2)

## 2020-01-24 LAB — BASIC METABOLIC PANEL
Anion gap: 10 (ref 5–15)
BUN: 15 mg/dL (ref 8–23)
CO2: 25 mmol/L (ref 22–32)
Calcium: 8.1 mg/dL — ABNORMAL LOW (ref 8.9–10.3)
Chloride: 103 mmol/L (ref 98–111)
Creatinine, Ser: 0.73 mg/dL (ref 0.44–1.00)
GFR, Estimated: >60 mL/min (ref >60)
Glucose, Bld: 115 mg/dL — ABNORMAL HIGH (ref 70–99)
Potassium: 3.8 mmol/L (ref 3.5–5.1)
Sodium: 138 mmol/L (ref 135–145)

## 2020-01-24 MED ORDER — POTASSIUM CHLORIDE IN NACL 20-0.9 MEQ/L-% IV SOLN
INTRAVENOUS | Status: DC
  Administered 2020-01-24: 75 mL via INTRAVENOUS
  Filled 2020-01-24 (×2): qty 1000

## 2020-01-24 MED ORDER — DOCUSATE SODIUM 100 MG PO CAPS
100.0000 mg | ORAL_CAPSULE | Freq: Two times a day (BID) | ORAL | Status: DC
  Administered 2020-01-24 – 2020-01-29 (×11): 100 mg via ORAL
  Filled 2020-01-24 (×12): qty 1

## 2020-01-24 MED ORDER — POLYETHYLENE GLYCOL 3350 17 G PO PACK
17.0000 g | PACK | Freq: Every day | ORAL | Status: DC | PRN
  Administered 2020-01-27: 17 g via ORAL
  Filled 2020-01-24 (×2): qty 1

## 2020-01-24 MED ORDER — SENNA 8.6 MG PO TABS
1.0000 | ORAL_TABLET | Freq: Every day | ORAL | Status: DC
  Administered 2020-01-24 – 2020-01-29 (×6): 8.6 mg via ORAL
  Filled 2020-01-24 (×7): qty 1

## 2020-01-24 NOTE — Evaluation (Signed)
Occupational Therapy Evaluation Patient Details Name: Angie Olson MRN: [id number] DOB: 25-May-1950 Today's Date: 01/24/2020    History of Present Illness Pt is a 69 yo female s/p Pedicle screw fixation from T10-L4.  Pt with prevoius back surgery.  Pt also with old L shoulder surgery, B THA, B knee arthroscopic surgery, and is in need of cataract surgery.     Clinical Impression   Pt admitted with the above diagnosis and has the deficits outlined below. Despite significant pain, pt is doing overall very well with adls. Pt walked to bathroom and toileted with very little assist, groomed at sink and ambulated in room with min guard since first time up.  Pt has difficulty getting to LEs to complete LE dressing and will introduce adaptive equipment if pain does not subside.  Feel as pain subsides, this task will get easier.  Rec HHOT since pt lives alone and ultimately would feel best if pt had daughter stay with her just the first few days at home to ensure safety.  Will focus on shower transfers and LE dressing next session.    Follow Up Recommendations  Home health OT;Supervision - Intermittent    Equipment Recommendations  None recommended by OT    Recommendations for Other Services       Precautions / Restrictions Precautions Precautions: Back;Fall Precaution Booklet Issued: No Precaution Comments: Pt very familiar with back precautions from old surgery Required Braces or Orthoses: Spinal Brace Spinal Brace: Lumbar corset Restrictions Weight Bearing Restrictions: No      Mobility Bed Mobility Overal bed mobility: Needs Assistance Bed Mobility: Supine to Sit     Supine to sit: Min assist     General bed mobility comments: min assist to scoot out to edge of bed.    Transfers Overall transfer level: Needs assistance Equipment used: Rolling walker (2 wheeled);1 person hand held assist Transfers: Sit to/from [facility] Sit to Stand: Min guard Stand  pivot transfers: Min guard       General transfer comment: Pt steady on feet. Required cues for hand placement on walker.    Balance Overall balance assessment: Needs assistance Sitting-balance support: Bilateral upper extremity supported;Feet supported Sitting balance-Leahy Scale: Good     Standing balance support: Bilateral upper extremity supported;During functional activity Standing balance-Leahy Scale: Poor Standing balance comment: Pt heavily reliant on walker due to pain.  Pt was able to let go with one hand while grooming but overall needed at least one hand on walker when standing.                            ADL either performed or assessed with clinical judgement   ADL Overall ADL's : Needs assistance/impaired Eating/Feeding: Set up;Sitting   Grooming: Wash/dry hands;Wash/dry face;Oral care;Supervision/safety;Standing;Cueing for compensatory techniques Grooming Details (indicate cue type and reason): Pt stood at sink for appx 2 minutes to complete grooming tasks. Pt in a lot of pain but was able to complete tasks without assist. Upper Body Bathing: Set up;Sitting   Lower Body Bathing: Moderate assistance;Sit to/from stand;Cueing for compensatory techniques Lower Body Bathing Details (indicate cue type and reason): Pt in too much pain to attempt to bring leg up in figure 4 position to wash feet or legs.  Upper Body Dressing : Minimal assistance;Sitting;Cueing for compensatory techniques Upper Body Dressing Details (indicate cue type and reason): Pt familiar with brace and donned with min assist to get started due to pain. Lower  Body Dressing: Moderate assistance;Sit to/from stand;Cueing for compensatory techniques;Cueing for back precautions Lower Body Dressing Details (indicate cue type and reason): Pt unable to cross legs up to dress at this time but feel once pain under better control, this will become easier.  If not, will intoduce reacher and sock aid. Toilet  Transfer: Minimal assistance;Ambulation;RW;Comfort height toilet;Grab bars Toilet Transfer Details (indicate cue type and reason): Pt walked to bathroom with min assist. Toileting- Clothing Manipulation and Hygiene: Supervision/safety;Sitting/lateral lean;Cueing for back precautions       Functional mobility during ADLs: Min guard;Rolling walker General ADL Comments: Pt doing well with all adls except LE dressing. Pt in too much pain to attempt on first day.  Will address and if has trouble, will try reacher and sock aid to promote independence for home.     Vision Baseline Vision/History: Wears glasses;Cataracts Wears Glasses: At all times Patient Visual Report: No change from baseline Vision Assessment?: No apparent visual deficits     Perception     Praxis Praxis Praxis tested?: Within functional limits    Pertinent Vitals/Pain Pain Assessment: 0-10 Pain Score: 8  Pain Location: back and abdomen Pain Descriptors / Indicators: Aching;Grimacing;Sore Pain Intervention(s): Limited activity within patient's tolerance;Monitored during session;Premedicated before session;Repositioned;Relaxation     Hand Dominance Right   Extremity/Trunk Assessment Upper Extremity Assessment Upper Extremity Assessment: Overall WFL for tasks assessed   Lower Extremity Assessment Lower Extremity Assessment: Defer to PT evaluation   Cervical / Trunk Assessment Cervical / Trunk Assessment: Other exceptions Cervical / Trunk Exceptions: muliple back surgeries   Communication Communication Communication: No difficulties   Cognition Arousal/Alertness: Awake/alert Behavior During Therapy: WFL for tasks assessed/performed Overall Cognitive Status: Within Functional Limits for tasks assessed                                     General Comments  PT with drain and IV.    Exercises     Shoulder Instructions      Home Living Family/patient expects to be discharged to:: Private  residence Living Arrangements: Alone Available Help at Discharge: Friend(s);Available PRN/intermittently Type of Home: House Home Access: Stairs to enter [employer] of Steps: 3 Entrance Stairs-Rails: Left Home Layout: Multi-level;Able to live on main level with bedroom/bathroom     Bathroom Shower/Tub: Walk-in shower;Door   Bathroom Toilet: Handicapped height     Home Equipment: Environmental consultant - 2 wheels;Shower seat;Bedside commode          Prior Functioning/Environment Level of Independence: Independent with assistive device(s)        Comments: walks with walker for long distances.        OT Problem List: Decreased activity tolerance;Impaired balance (sitting and/or standing);Decreased knowledge of use of DME or AE;Pain      OT Treatment/Interventions: Self-care/ADL training;DME and/or AE instruction;Therapeutic activities    OT Goals(Current goals can be found in the care plan section) Acute Rehab OT Goals Patient Stated Goal: to go home with less pain OT Goal Formulation: With patient Time For Goal Achievement: 02/07/20 Potential to Achieve Goals: Good ADL Goals Pt Will Perform Lower Body Bathing: with modified independence Pt Will Perform Lower Body Dressing: with modified independence Pt Will Perform Tub/Shower Transfer: Shower transfer;shower seat;rolling walker;with supervision Additional ADL Goal #1: Pt will walk to bathroom with walker and complete all toileting on raised commode with mod I.  OT Frequency: Min 2X/week   Barriers to D/C: Decreased  caregiver support  Pt lives alone. Has neighbors that can check on her and a daughter that "has a  lot of animals" so cannot stay with her.  Feel if she had someone to stay with her for just a few days, it would be helpful.       Co-evaluation              AM-PAC OT "6 Clicks" Daily Activity     Outcome Measure Help from another person eating meals?: None Help from another person taking care of  personal grooming?: None Help from another person toileting, which includes using toliet, bedpan, or urinal?: A Little Help from another person bathing (including washing, rinsing, drying)?: A Little Help from another person to put on and taking off regular upper body clothing?: A Little Help from another person to put on and taking off regular lower body clothing?: A Lot 6 Click Score: 19   End of Session Equipment Utilized During Treatment: Rolling walker;Gait belt Nurse Communication: Mobility status  Activity Tolerance: Patient limited by pain Patient left: in chair;with call bell/phone within reach  OT Visit Diagnosis: Unsteadiness on feet (R26.81);Pain Pain - part of body:  (back)                Time: 7322-0254 OT Time Calculation (min): 25 min Charges:  OT General Charges $OT Visit: 1 Visit OT Evaluation $OT Eval Moderate Complexity: 1 Mod  Glenford Peers 01/24/2020, 9:43 AM

## 2020-01-24 NOTE — Progress Notes (Signed)
°   01/23/20 2234  Vitals  Temp 97.9 F (36.6 C)  Temp Source Oral  BP (!) 146/77  MAP (mmHg) 96  BP Location Right Arm  BP Method Automatic  Patient Position (if appropriate) Lying  Pulse Rate 85  ECG Heart Rate 88  Resp 18  Level of Consciousness  Level of Consciousness Alert  MEWS COLOR  MEWS Score Color Green  Oxygen Therapy  SpO2 96 %  O2 Device Room Air  PCA/Epidural/Spinal Assessment  Respiratory Pattern Regular;Unlabored  MEWS Score  MEWS Temp 0  MEWS Systolic 0  MEWS Pulse 0  MEWS RR 0  MEWS LOC 0  MEWS Score 0   The patient is admitted from PACU to 4 N progressive. A & O x 4. Patient oriented to staff, call bell and ascom. Noted honeycomb dressing to the back and Hemovac with minimal bright red blood. Patient voiced no concern at this time. Will continue to monitor.

## 2020-01-24 NOTE — Progress Notes (Signed)
Subjective: Patient reports back and abdomen pain. Has not passed gas or gotten out of bed yet.  Objective: Vital signs in last 24 hours: Temp:  [97.9 F (36.6 C)-98.8 F (37.1 C)] 98.8 F (37.1 C) (11/09 0309) Pulse Rate:  [70-93] 88 (11/09 0309) Resp:  [6-20] 13 (11/09 0309) BP: (99-153)/(57-93) 153/80 (11/09 0309) SpO2:  [92 %-100 %] 96 % (11/09 0309)  Intake/Output from previous day: 11/08 0701 - 11/09 0700 In: 2604.5 [I.V.:1704.5; IV Piggyback:700] Out: 1605 [Urine:1100; Drains:305; Blood:200] Intake/Output this shift: No intake/output data recorded.  Neurologic: Grossly normal  Lab Results: Lab Results  Component Value Date   WBC 7.6 01/19/2020   HGB 10.5 (L) 01/19/2020   HCT 35.4 (L) 01/19/2020   MCV 81.4 01/19/2020   PLT 337 01/19/2020   Lab Results  Component Value Date   INR 1.00 09/26/2016   BMET Lab Results  Component Value Date   NA 143 01/19/2020   K 3.7 01/19/2020   CL 106 01/19/2020   CO2 27 01/19/2020   GLUCOSE 106 (H) 01/19/2020   BUN 16 01/19/2020   CREATININE 0.76 01/19/2020   CALCIUM 8.8 (L) 01/19/2020    Studies/Results: DG THORACOLUMABAR SPINE  Result Date: 01/23/2020 CLINICAL DATA:  PLIF T10-L1 EXAM: THORACOLUMBAR SPINE 1V COMPARISON:  CT 06/14/2019 FINDINGS: C-arm images show placement of pedicle screws at T10, T11, T12 and L1 posterior hardware at L2 previously seen has been removed. IMPRESSION: Interval placement of pedicle screws at T10, T11, T12 and L1. Electronically Signed   By: Angie Olson M.D.   On: 01/23/2020 17:07   DG C-Arm 1-60 Min  Result Date: 01/23/2020 CLINICAL DATA:  PLIF T10-L1 EXAM: THORACOLUMBAR SPINE 1V COMPARISON:  CT 06/14/2019 FINDINGS: C-arm images show placement of pedicle screws at T10, T11, T12 and L1 posterior hardware at L2 previously seen has been removed. IMPRESSION: Interval placement of pedicle screws at T10, T11, T12 and L1. Electronically Signed   By: Angie Olson M.D.   On: 01/23/2020 17:07     Assessment/Plan: 69 year old female postop day 1 from thoracolumbar fusion. Mobilize today. Will get a kub and add senokot/colace.    LOS: 1 day    Angie Olson Angie Olson 01/24/2020, 7:58 AM

## 2020-01-24 NOTE — Evaluation (Signed)
Physical Therapy Evaluation Patient Details Name: Angie Olson MRN: [id number] DOB: 1950-11-22 Today's Date: 01/24/2020   History of Present Illness  Pt is a 69 yo female s/p Pedicle screw fixation from T10-L4.  Pt with previous back surgeries (pt reports this is #10).  Pt also with old L shoulder surgery, B THA, B knee arthroscopic surgery, and is in need of cataract surgery.  Clinical Impression  Pt admitted with above diagnosis. Pt was able to ambulate 80' with RW but required multiple cues for safety, posture, and RW proximity.  She was not able to exactly recall precautions but did describe them and performed log roll technique without cues.  Pt lives alone and does present with decreased mobility, strength, safety, endurance, balance, and ROM.  Pt is interested in going to CIR at d/c due to living alone and wanting to increase mobility. Pt currently with functional limitations due to the deficits listed below (see PT Problem List). Pt will benefit from skilled PT to increase their independence and safety with mobility to allow discharge to the venue listed below.       Follow Up Recommendations CIR (If not then she declined SNF and want homes with dtr and HHPT)    Equipment Recommendations  None recommended by PT    Recommendations for Other Services       Precautions / Restrictions Precautions Precautions: Back;Fall Precaution Booklet Issued: No Precaution Comments: Familiar with back precautions from prior surgeries Required Braces or Orthoses: Spinal Brace Spinal Brace: Lumbar corset;Applied in sitting position      Mobility  Bed Mobility Overal bed mobility: Needs Assistance Bed Mobility: Rolling;Sidelying to Sit;Sit to Sidelying Rolling: Min guard Sidelying to sit: Min guard     Sit to sidelying: Min guard General bed mobility comments: Performed log roll technique without cues    Transfers Overall transfer level: Needs assistance Equipment used: Rolling  walker (2 wheeled) Transfers: Sit to/from Stand Sit to Stand: Min guard         General transfer comment: Min guard for safety  Ambulation/Gait    Ambulated 60'  Assistive device: Rolling walker (2 wheeled) Gait Pattern/deviations: Step-through pattern;Decreased stride length;Trunk flexed Gait velocity: decreased   General Gait Details: Pt tending to flex trunk and keep RW too far forward.  REquired multiple cues for posture and RW proximity  Stairs            Wheelchair Mobility    Modified Rankin (Stroke Patients Only)       Balance Overall balance assessment: Needs assistance Sitting-balance support: No upper extremity supported;Feet supported Sitting balance-Leahy Scale: Good     Standing balance support: Bilateral upper extremity supported;During functional activity Standing balance-Leahy Scale: Poor Standing balance comment: REquired RW due to pain                             Pertinent Vitals/Pain Pain Assessment: 0-10 Pain Score: 6  Pain Location: back and abdomen Pain Descriptors / Indicators: Aching;Sore;Dull Pain Intervention(s): Limited activity within patient's tolerance;Monitored during session;Repositioned    Home Living Family/patient expects to be discharged to:: Unsure (Pt wants to go to inpatient rehab and if not then home w daughter) Living Arrangements: Alone Available Help at Discharge: Friend(s);Available PRN/intermittently Type of Home: House Home Access: Stairs to enter Entrance Stairs-Rails: Left Entrance Stairs-Number of Steps: 3 Home Layout: Multi-level;Able to live on main level with bedroom/bathroom Home Equipment: Gilford Rile - 2 wheels;Shower seat;Bedside commode Additional Comments:  Reports has an upright platform rollator so that she doesn't hurt her wrist    Prior Function Level of Independence: Independent with assistive device(s)         Comments: Reports uses RW for long distances; independent with ADLs,  IADLs , and driving     Hand Dominance   Dominant Hand: Right    Extremity/Trunk Assessment   Upper Extremity Assessment Upper Extremity Assessment: Defer to OT evaluation    Lower Extremity Assessment Lower Extremity Assessment: LLE deficits/detail;RLE deficits/detail RLE Deficits / Details: Demonstrating ROM WFL and MMT of at least 3/5 but not further tested due to acuity of sx and back/abd pain RLE Sensation: WNL LLE Deficits / Details: Demonstrating ROM WFL and MMT of at least 3/5 but not further tested due to acuity of sx and back/abd pain LLE Sensation: WNL    Cervical / Trunk Assessment Cervical / Trunk Assessment: Other exceptions Cervical / Trunk Exceptions: muliple back surgeries; back brace in place  Communication   Communication: No difficulties  Cognition Arousal/Alertness: Awake/alert Behavior During Therapy: WFL for tasks assessed/performed Overall Cognitive Status: Within Functional Limits for tasks assessed                                        General Comments General comments (skin integrity, edema, etc.): Pt had difficulty recalling "no bending, lifting, twisting" but was able to describe her precautions.    Exercises     Assessment/Plan    PT Assessment Patient needs continued PT services  PT Problem List Decreased strength;Decreased mobility;Decreased safety awareness;Decreased range of motion;Decreased activity tolerance;Decreased balance;Decreased knowledge of use of DME;Pain;Decreased knowledge of precautions       PT Treatment Interventions DME instruction;Therapeutic activities;Modalities;Gait training;Therapeutic exercise;Patient/family education;Stair training;Balance training;Functional mobility training    PT Goals (Current goals can be found in the Care Plan section)  Acute Rehab PT Goals Patient Stated Goal: Go to CIR for rehab then home PT Goal Formulation: With patient Time For Goal Achievement: 02/07/20 Potential  to Achieve Goals: Good    Frequency Min 5X/week   Barriers to discharge Decreased caregiver support      Co-evaluation               AM-PAC PT "6 Clicks" Mobility  Outcome Measure Help needed turning from your back to your side while in a flat bed without using bedrails?: None Help needed moving from lying on your back to sitting on the side of a flat bed without using bedrails?: None Help needed moving to and from a bed to a chair (including a wheelchair)?: A Little Help needed standing up from a chair using your arms (e.g., wheelchair or bedside chair)?: A Little Help needed to walk in hospital room?: A Little Help needed climbing 3-5 steps with a railing? : A Little 6 Click Score: 20    End of Session Equipment Utilized During Treatment: Gait belt;Back brace Activity Tolerance: Patient limited by pain Patient left: in bed;with call bell/phone within reach;with bed alarm set Nurse Communication: Mobility status PT Visit Diagnosis: Other abnormalities of gait and mobility (R26.89);Unsteadiness on feet (R26.81);Muscle weakness (generalized) (M62.81)    Time: 2229-7989 PT Time Calculation (min) (ACUTE ONLY): 21 min   Charges:   PT Evaluation $PT Eval Moderate Complexity: 1 Mod          Elisabel Hanover, PT Acute Rehab Services Pager [phone] [facility] [phone]  Mikael Spray Susann Lawhorne 01/24/2020, 4:15 PM

## 2020-01-24 NOTE — Progress Notes (Signed)
Rehab Admissions Coordinator Note:  Per PT recommendation, this patient was screened by Raechel Ache for appropriateness for an Inpatient Acute Rehab Consult. Per chart review, pt is POD 1 and is already ambulating 60 feet with Min G and per OT eval, pt is doing "overall very well with ADLs." Given current functional level, anticipate pt will not need an IP Rehab stay by the time insurance returns a determination.  Will not pursue CIR consult at this time.   Raechel Ache 01/24/2020, 4:21 PM  I can be reached at [phone].

## 2020-01-25 ENCOUNTER — Encounter (HOSPITAL_COMMUNITY): Payer: Self-pay | Admitting: Neurosurgery

## 2020-01-25 MED ORDER — BISACODYL 10 MG RE SUPP
10.0000 mg | Freq: Every day | RECTAL | Status: DC | PRN
  Administered 2020-01-27: 10 mg via RECTAL
  Filled 2020-01-25: qty 1

## 2020-01-25 NOTE — Progress Notes (Signed)
Physical Therapy Treatment Patient Details Name: Angie Olson MRN: [id number] DOB: 1951/02/22 Today's Date: 01/25/2020    History of Present Illness Pt is a 69 yo female s/p Pedicle screw fixation from T10-L4.  Pt with previous back surgeries (pt reports this is #10).  Pt also with old L shoulder surgery, B THA, B knee arthroscopic surgery, and is in need of cataract surgery.    PT Comments    Pt with good recall of back precautions however requires verbal cues to adhere functionally. Pt requiring min guard for transfers and verbal cues for safe hand placement. Pt amb 80' with RW and min guard assist. Pt continues with trunk flexion and pushing RW to far out in front of self despite max directional verbal cues. Pt reports she can stay with her daughter as long as she needs and that her daughter doesn't work and would be able to provide 24/7 assist. Acute PT to cont to follow to progress indep with mobility.   Follow Up Recommendations  Home health PT, assist for OOB mobility     Equipment Recommendations       Recommendations for Other Services       Precautions / Restrictions Precautions Precautions: Back;Fall Precaution Booklet Issued: Yes (comment) Precaution Comments: pt able to recall 2 of 3 precautions; pt reeducated and handout provided Required Braces or Orthoses: Spinal Brace Spinal Brace: Lumbar corset;Applied in sitting position Restrictions Weight Bearing Restrictions: No    Mobility  Bed Mobility Overal bed mobility: Needs Assistance Bed Mobility: Rolling;Sidelying to Sit;Sit to Sidelying Rolling: Min guard Sidelying to sit: Min guard     Sit to sidelying: Min guard General bed mobility comments: performed log roll technique to the RT and needed to be cued to not twist to her LT  Transfers Overall transfer level: Needs assistance Equipment used: Rolling walker (2 wheeled) Transfers: Sit to/from Stand Sit to Stand: Min guard         General  transfer comment: pt cued to keep back straight during transfer  Ambulation/Gait Ambulation/Gait assistance: Min guard Gait Distance (Feet): 80 Feet Assistive device: Rolling walker (2 wheeled)   Gait velocity: decreased   General Gait Details: pt had a flexed trunk and kept RW too far forward and needed multiple cues for posture and keeping the RW nearby throughout gait   Stairs             Wheelchair Mobility    Modified Rankin (Stroke Patients Only)       Balance Overall balance assessment: Needs assistance Sitting-balance support: No upper extremity supported;Feet supported Sitting balance-Leahy Scale: Good     Standing balance support: Bilateral upper extremity supported;During functional activity Standing balance-Leahy Scale: Poor Standing balance comment: RW needed for standing balance secondary to pain; kept complaining of arms hurting from pushing so much on the walker, but was unable to put more weight on BLE despite multiple cues                            Cognition Arousal/Alertness: Awake/alert Behavior During Therapy: WFL for tasks assessed/performed Overall Cognitive Status: Within Functional Limits for tasks assessed                                        Exercises      General Comments        Pertinent  Vitals/Pain Pain Score: 7  Pain Location: back and abdomen Pain Descriptors / Indicators: Aching;Sore;Dull Pain Intervention(s): Limited activity within patient's tolerance;Monitored during session;Repositioned    Home Living                      Prior Function            PT Goals (current goals can now be found in the care plan section) Progress towards PT goals: Progressing toward goals    Frequency    Min 5X/week      PT Plan      Co-evaluation PT/OT/SLP Co-Evaluation/Treatment: Yes            AM-PAC PT "6 Clicks" Mobility   Outcome Measure  Help needed turning from your back  to your side while in a flat bed without using bedrails?: None Help needed moving from lying on your back to sitting on the side of a flat bed without using bedrails?: None Help needed moving to and from a bed to a chair (including a wheelchair)?: A Little Help needed standing up from a chair using your arms (e.g., wheelchair or bedside chair)?: A Little Help needed to walk in hospital room?: A Little Help needed climbing 3-5 steps with a railing? : A Lot 6 Click Score: 19    End of Session Equipment Utilized During Treatment: Gait belt;Back brace Activity Tolerance: Patient limited by pain Patient left: with call bell/phone within reach;with bed alarm set;in chair;with chair alarm set Nurse Communication: Mobility status PT Visit Diagnosis: Other abnormalities of gait and mobility (R26.89);Unsteadiness on feet (R26.81);Muscle weakness (generalized) (M62.81)     Time: 4628-6381 PT Time Calculation (min) (ACUTE ONLY): 28 min  Charges:  $Gait Training: 8-22 mins $Therapeutic Activity: 8-22 mins                     Kittie Plater, PT, DPT Acute Rehabilitation Services Pager #: [phone] Office #: [phone]    Berline Lopes 01/25/2020, 1:20 PM

## 2020-01-25 NOTE — Progress Notes (Signed)
Subjective: Patient reports Stable condition abdominal pain still no flatus condition of back pain but legs are improved from preop  Objective: Vital signs in last 24 hours: Temp:  [98.1 F (36.7 C)-100.1 F (37.8 C)] 98.4 F (36.9 C) (11/10 0754) Pulse Rate:  [89-98] 98 (11/10 0754) Resp:  [18-20] 18 (11/10 0754) BP: (114-138)/(48-66) 130/62 (11/10 0754) SpO2:  [89 %-99 %] 99 % (11/10 0754)  Intake/Output from previous day: 11/09 0701 - 11/10 0700 In: 1480.1 [P.O.:480; I.V.:900.1; IV Piggyback:100] Out: 176 [Urine:1; Drains:175] Intake/Output this shift: No intake/output data recorded.  Patient is awake and alert strength is 5 out of 5 incisions clean dry and intact  Lab Results: Recent Labs    01/24/20 0848  WBC 14.3*  HGB 8.1*  HCT 26.7*  PLT 378   BMET Recent Labs    01/24/20 0848  NA 138  K 3.8  CL 103  CO2 25  GLUCOSE 115*  BUN 15  CREATININE 0.73  CALCIUM 8.1*    Studies/Results: DG THORACOLUMABAR SPINE  Result Date: 01/23/2020 CLINICAL DATA:  PLIF T10-L1 EXAM: THORACOLUMBAR SPINE 1V COMPARISON:  CT 06/14/2019 FINDINGS: C-arm images show placement of pedicle screws at T10, T11, T12 and L1 posterior hardware at L2 previously seen has been removed. IMPRESSION: Interval placement of pedicle screws at T10, T11, T12 and L1. Electronically Signed   By: Nelson Chimes M.D.   On: 01/23/2020 17:07   DG Abd 1 View  Result Date: 01/24/2020 CLINICAL DATA:  Recent back surgery.  Epigastric pain and bloating. EXAM: ABDOMEN - 1 VIEW COMPARISON:  07/21/2014.  CT abdomen pelvis 01/31/2019. FINDINGS: Nonobstructive bowel gas pattern. Gas in nondilated stomach. Mild amount of stool in the colon. Surgical drain in the upper lumbar spine. Interval revision of lumbar fusion. Bilateral pedicle screw and rod fusion extending from T10 through L4. Interbody spacers L2-3, L3-4, L4-5, L5-S1. IMPRESSION: Mildly distended stomach. Negative for ileus. Mild amount of stool in the right  colon. Electronically Signed   By: Franchot Gallo M.D.   On: 01/24/2020 08:37   DG C-Arm 1-60 Min  Result Date: 01/23/2020 CLINICAL DATA:  PLIF T10-L1 EXAM: THORACOLUMBAR SPINE 1V COMPARISON:  CT 06/14/2019 FINDINGS: C-arm images show placement of pedicle screws at T10, T11, T12 and L1 posterior hardware at L2 previously seen has been removed. IMPRESSION: Interval placement of pedicle screws at T10, T11, T12 and L1. Electronically Signed   By: Nelson Chimes M.D.   On: 01/23/2020 17:07    Assessment/Plan: Postop day 2 T10 L4 extension of fusion doing well still has a little bit of a postop ileus told her to be careful with p.o. intake she does not have a problem right now too much with nausea.  Will try Dulcolax stimulate with physical and Occupational Therapy continue Hemovac for now  LOS: 2 days     Elaina Hoops 01/25/2020, 8:32 AM

## 2020-01-26 MED FILL — Sodium Chloride IV Soln 0.9%: INTRAVENOUS | Qty: 1000 | Status: AC

## 2020-01-26 MED FILL — Heparin Sodium (Porcine) Inj 1000 Unit/ML: INTRAMUSCULAR | Qty: 30 | Status: AC

## 2020-01-26 NOTE — Progress Notes (Signed)
Subjective: Patient reports Doing little better this morning still no bowel movement but is passing gas no leg pain back pain manageable  Objective: Vital signs in last 24 hours: Temp:  [98.1 F (36.7 C)-100.1 F (37.8 C)] 98.4 F (36.9 C) (11/11 0747) Pulse Rate:  [87-102] 87 (11/11 0747) Resp:  [16-18] 16 (11/11 0747) BP: (97-122)/(42-57) 112/50 (11/11 0747) SpO2:  [90 %-94 %] 93 % (11/11 0747)  Intake/Output from previous day: 11/10 0701 - 11/11 0700 In: 3 [I.V.:3] Out: 80 [Drains:80] Intake/Output this shift: No intake/output data recorded.  Awake and alert strength 5/5 wound clean dry and intact  Lab Results: Recent Labs    01/24/20 0848  WBC 14.3*  HGB 8.1*  HCT 26.7*  PLT 378   BMET Recent Labs    01/24/20 0848  NA 138  K 3.8  CL 103  CO2 25  GLUCOSE 115*  BUN 15  CREATININE 0.73  CALCIUM 8.1*    Studies/Results: No results found.  Assessment/Plan: Postop day  3 thoracic fusion drain output 80 throughout 24 hours will DC continue to mobilize with physical occupational therapy  LOS: 3 days     Angie Olson 01/26/2020, 8:30 AM

## 2020-01-26 NOTE — Progress Notes (Signed)
Physical Therapy Treatment Patient Details Name: Angie Olson MRN: [id number] DOB: [birth year] Today's Date: 01/26/2020    History of Present Illness Pt is a 69 yo female s/p Pedicle screw fixation from T10-L4.  Pt with previous back surgeries (pt reports this is #10).  Pt also with old L shoulder surgery, B THA, B knee arthroscopic surgery, and is in need of cataract surgery.    PT Comments    The pt is progressing well with PT and mobility goals at this time, as she was able to complete stair training with use of single rail and minA only. The pt continues to be limited by significant abdominal pain (worse than her back pain this morning) and reports no BM since she has been here. The pt will continue to benefit from skilled PT to progress posture and endurance for gait training as well as improved positioning in RW and use of AD. The pt reports she will have her daughter with her for 24/7 supervision and has only 4 steps to navigate to enter her daughter's home which she was able to demonstrate well this morning. The pt will be safe to d/c home with her daughter for assist when medically cleared.     Follow Up Recommendations  Home health PT;Supervision for mobility/OOB     Equipment Recommendations  None recommended by PT    Recommendations for Other Services       Precautions / Restrictions Precautions Precautions: Back;Fall Precaution Booklet Issued: Yes (comment) Precaution Comments: pt able to recall 2 of 3 precautions; pt reeducated and handout provided Required Braces or Orthoses: Spinal Brace Spinal Brace: Lumbar corset;Applied in sitting position Restrictions Weight Bearing Restrictions: No    Mobility  Bed Mobility Overal bed mobility: Needs Assistance             General bed mobility comments: Pt sitting EOB at start and end of session  Transfers Overall transfer level: Needs assistance Equipment used: Rolling walker (2 wheeled) Transfers: Sit to/from  Stand Sit to Stand: Min guard         General transfer comment: no assist given to rise from bed or recliner x 5 through session. minG for safety due to pt pain  Ambulation/Gait Ambulation/Gait assistance: Min guard Gait Distance (Feet): 40 Feet Assistive device: Rolling walker (2 wheeled) Gait Pattern/deviations: Step-through pattern;Decreased stride length;Trunk flexed Gait velocity: decreased Gait velocity interpretation: <1.31 ft/sec, indicative of household ambulator General Gait Details: pt had a flexed trunk and kept RW too far forward and needed multiple cues for posture and keeping the RW nearby throughout gait   Stairs Stairs: Yes Stairs assistance: Min assist Stair Management: One rail Left;Step to pattern Number of Stairs: 2 (x3) General stair comments: complete with RLE leading ascending, minA throuhg HHA to steady and power up       Balance Overall balance assessment: Needs assistance Sitting-balance support: No upper extremity supported;Feet supported Sitting balance-Leahy Scale: Good     Standing balance support: Bilateral upper extremity supported;During functional activity Standing balance-Leahy Scale: Poor Standing balance comment: RW needed for standing balance secondary to pain; kept complaining of arms hurting from pushing so much on the walker, but was unable to put more weight on BLE despite multiple cues                            Cognition Arousal/Alertness: Awake/alert Behavior During Therapy: WFL for tasks assessed/performed Overall Cognitive Status: Within Functional Limits for tasks  assessed                                 General Comments: Pt with some internal distraction by pain, otherwise functional      Exercises      General Comments General comments (skin integrity, edema, etc.): Drain from incision site, pt with significant pain from gas/lack of BM      Pertinent Vitals/Pain Pain Assessment:  0-10 Pain Score: 8  Pain Location: abdomen > back Pain Descriptors / Indicators: Cramping;Sharp Pain Intervention(s): Limited activity within patient's tolerance;Monitored during session;Repositioned           PT Goals (current goals can now be found in the care plan section) Acute Rehab PT Goals PT Goal Formulation: With patient Time For Goal Achievement: 02/07/20 Potential to Achieve Goals: Good Progress towards PT goals: Progressing toward goals    Frequency    Min 5X/week      PT Plan Current plan remains appropriate       AM-PAC PT "6 Clicks" Mobility   Outcome Measure  Help needed turning from your back to your side while in a flat bed without using bedrails?: None Help needed moving from lying on your back to sitting on the side of a flat bed without using bedrails?: None Help needed moving to and from a bed to a chair (including a wheelchair)?: A Little Help needed standing up from a chair using your arms (e.g., wheelchair or bedside chair)?: A Little Help needed to walk in hospital room?: A Little Help needed climbing 3-5 steps with a railing? : A Little 6 Click Score: 20    End of Session Equipment Utilized During Treatment: Gait belt;Back brace Activity Tolerance: Patient limited by pain Patient left: in bed;with call bell/phone within reach (sitting EOB) Nurse Communication: Mobility status PT Visit Diagnosis: Other abnormalities of gait and mobility (R26.89);Unsteadiness on feet (R26.81);Muscle weakness (generalized) (M62.81)     Time: 4818-5631 PT Time Calculation (min) (ACUTE ONLY): 21 min  Charges:  $Gait Training: 8-22 mins                     Karma Ganja, PT, DPT   Acute Rehabilitation Department Pager #: [phone]   Otho Bellows 01/26/2020, 9:25 AM

## 2020-01-26 NOTE — Progress Notes (Signed)
Occupational Therapy Treatment Patient Details Name: Angie Olson MRN: [id number] DOB: [birth year] Today's Date: 01/26/2020    History of present illness Pt is a 69 yo female s/p Pedicle screw fixation from T10-L4.  Pt with previous back surgeries (pt reports this is #10).  Pt also with old L shoulder surgery, B THA, B knee arthroscopic surgery, and is in need of cataract surgery.   OT comments  Patient continues to make gradual progress towards goals in skilled OT session. Patient's session encompassed functional ambulation and bed mobility, as pt remains greatly limited by pain in session (RN notified at end of session). Pt with froward flexion posture while completing ambulation, and could correct with cues, but revert back to previous posture. Pt able to demonstrate back precautions, but will require increased attempts to complete lower body dressing. Discharge remains appropriate, will continue to follow acutely.    Follow Up Recommendations  Home health OT;Supervision - Intermittent    Equipment Recommendations  None recommended by OT    Recommendations for Other Services      Precautions / Restrictions Precautions Precautions: Back;Fall Precaution Booklet Issued: No Precaution Comments: able to demonstrate all precuations appropriately Required Braces or Orthoses: Spinal Brace Spinal Brace: Lumbar corset;Applied in sitting position Restrictions Weight Bearing Restrictions: No       Mobility Bed Mobility Overal bed mobility: Needs Assistance Bed Mobility: Rolling;Sidelying to Sit Rolling: Min guard Sidelying to sit: Min guard          Transfers Overall transfer level: Needs assistance Equipment used: Rolling walker (2 wheeled)   Sit to Stand: Min guard         General transfer comment: min gaurd for safety due to pain, forward flexed posture when walkinh    Balance Overall balance assessment: Needs assistance Sitting-balance support: No upper extremity  supported;Feet supported Sitting balance-Leahy Scale: Good     Standing balance support: Bilateral upper extremity supported;During functional activity Standing balance-Leahy Scale: Poor Standing balance comment: reliant on walker for ambulation forward flexed posture throughout                           ADL either performed or assessed with clinical judgement   ADL Overall ADL's : Needs assistance/impaired                 Upper Body Dressing : Minimal assistance;Sitting;Cueing for compensatory techniques Upper Body Dressing Details (indicate cue type and reason): Pt familiar with brace and donned with set up to get started due to pain.                 Functional mobility during ADLs: Min guard;Rolling walker General ADL Comments: Continues to progress, but limited by pain, per report was able to ambulate back and forth to bathroom, not wanting to attempt lower body dressing at this time due to pain     Vision       Perception     Praxis      Cognition Arousal/Alertness: Awake/alert Behavior During Therapy: WFL for tasks assessed/performed Overall Cognitive Status: Within Functional Limits for tasks assessed                                 General Comments: Pt with some internal distraction by pain, otherwise functional        Exercises     Shoulder Instructions  General Comments      Pertinent Vitals/ Pain       Pain Assessment: Faces Faces Pain Scale: Hurts whole lot Pain Location: back Pain Descriptors / Indicators: Cramping;Sharp Pain Intervention(s): Limited activity within patient's tolerance;Monitored during session;Repositioned;Patient requesting pain meds-RN notified  Home Living                                          Prior Functioning/Environment              Frequency  Min 2X/week        Progress Toward Goals  OT Goals(current goals can now be found in the care plan  section)  Progress towards OT goals: Progressing toward goals  Acute Rehab OT Goals Patient Stated Goal: less pain OT Goal Formulation: With patient Time For Goal Achievement: 02/07/20 Potential to Achieve Goals: Good  Plan Discharge plan remains appropriate    Co-evaluation                 AM-PAC OT "6 Clicks" Daily Activity     Outcome Measure   Help from another person eating meals?: None Help from another person taking care of personal grooming?: None Help from another person toileting, which includes using toliet, bedpan, or urinal?: A Little Help from another person bathing (including washing, rinsing, drying)?: A Little Help from another person to put on and taking off regular upper body clothing?: A Little Help from another person to put on and taking off regular lower body clothing?: A Lot 6 Click Score: 19    End of Session Equipment Utilized During Treatment: Rolling walker;Gait belt  OT Visit Diagnosis: Unsteadiness on feet (R26.81);Pain   Activity Tolerance Patient limited by pain   Patient Left in bed;with call bell/phone within reach;with family/visitor present (sitting EOB)   Nurse Communication Mobility status        Time: 9244-6286 OT Time Calculation (min): 15 min  Charges: OT General Charges $OT Visit: 1 Visit OT Treatments $Self Care/Home Management : 8-22 mins  Corinne Ports E. Ulyssa Walthour, COTA/L Acute Rehabilitation Services [place] 01/26/2020, 2:46 PM

## 2020-01-26 NOTE — Progress Notes (Signed)
OT Cancellation Note  Patient Details Name: CAMARYN LUMBERT MRN: [id number] DOB: 03-30-1950   Cancelled Treatment:    Reason Eval/Treat Not Completed: Pain limiting ability to participate Pt had already worked with PT and had drain removed and in significant pain. Pt declined until later, will continue to follow.  Corinne Ports E. S[place], [place] Acute Rehabilitation Services [phone] [place] 01/26/2020, 10:04 AM

## 2020-01-27 LAB — CBC
HCT: 23.4 % — ABNORMAL LOW (ref 36.0–46.0)
Hemoglobin: 7.3 g/dL — ABNORMAL LOW (ref 12.0–15.0)
MCH: 25.2 pg — ABNORMAL LOW (ref 26.0–34.0)
MCHC: 31.2 g/dL (ref 30.0–36.0)
MCV: 80.7 fL (ref 80.0–100.0)
Platelets: 318 10*3/uL (ref 150–400)
RBC: 2.9 MIL/uL — ABNORMAL LOW (ref 3.87–5.11)
RDW: 15.9 % — ABNORMAL HIGH (ref 11.5–15.5)
WBC: 7.1 10*3/uL (ref 4.0–10.5)
nRBC: 0 % (ref 0.0–0.2)

## 2020-01-27 MED ORDER — SODIUM CHLORIDE 0.9 % IV BOLUS
500.0000 mL | Freq: Once | INTRAVENOUS | Status: AC
  Administered 2020-01-27: 500 mL via INTRAVENOUS

## 2020-01-27 MED ORDER — FLEET ENEMA 7-19 GM/118ML RE ENEM
1.0000 | ENEMA | Freq: Once | RECTAL | Status: AC
  Administered 2020-01-28: 1 via RECTAL
  Filled 2020-01-27: qty 1

## 2020-01-27 NOTE — Progress Notes (Signed)
Subjective: Patient reports was dizzy when she got up and still dizzy now. Has not had a bm yet.   Objective: Vital signs in last 24 hours: Temp:  [97.8 F (36.6 C)-99.2 F (37.3 C)] 99.2 F (37.3 C) (11/12 1114) Pulse Rate:  [90-104] 92 (11/12 1114) Resp:  [16-20] 16 (11/12 1114) BP: (101-124)/(44-68) 104/44 (11/12 1114) SpO2:  [91 %-100 %] 91 % (11/12 1114)  Intake/Output from previous day: 11/11 0701 - 11/12 0700 In: 843 [P.O.:840; I.V.:3] Out: -  Intake/Output this shift: No intake/output data recorded.  Neurologic: Grossly normal  Lab Results: Lab Results  Component Value Date   WBC 14.3 (H) 01/24/2020   HGB 8.1 (L) 01/24/2020   HCT 26.7 (L) 01/24/2020   MCV 80.4 01/24/2020   PLT 378 01/24/2020   Lab Results  Component Value Date   INR 1.00 09/26/2016   BMET Lab Results  Component Value Date   NA 138 01/24/2020   K 3.8 01/24/2020   CL 103 01/24/2020   CO2 25 01/24/2020   GLUCOSE 115 (H) 01/24/2020   BUN 15 01/24/2020   CREATININE 0.73 01/24/2020   CALCIUM 8.1 (L) 01/24/2020    Studies/Results: No results found.  Assessment/Plan: Blood pressure 104/55. Ordered CBC and 500cc NS bolus. Work on [employer] today. Likely home tomorrow.    LOS: 4 days    Ocie Cornfield [facility] 01/27/2020, 11:27 AM

## 2020-01-27 NOTE — Progress Notes (Signed)
Physical Therapy Treatment Patient Details Name: Angie Olson MRN: [id number] DOB: 08-17-1950 Today's Date: 01/27/2020    History of Present Illness Pt is a 69 yo female s/p Pedicle screw fixation from T10-L4.  Pt with previous back surgeries (pt reports this is #10).  Pt also with old L shoulder surgery, B THA, B knee arthroscopic surgery, and is in need of cataract surgery.    PT Comments    The  Pt is progressing well with mobility and PT goals at this time, but remains somewhat limited by pain due to her inability to have a BM since admission. The pt was able to complete multiple exercises for general LE strengthening as well as complete multiple short bouts of mobility in the room and hallway with use of RW and minG for safety. Focused today on posture during gait, proximity to RW and reducing trunk flexion. The pt will continue to benefit from skilled PT to progress standing stability and activity tolerance to facilitate safe return home when medically cleared.    Follow Up Recommendations  Home health PT;Supervision for mobility/OOB     Equipment Recommendations  None recommended by PT    Recommendations for Other Services       Precautions / Restrictions Precautions Precautions: Back;Fall Precaution Booklet Issued: No Precaution Comments: able to demonstrate all precuations appropriately Required Braces or Orthoses: Spinal Brace Spinal Brace: Lumbar corset;Applied in sitting position Restrictions Weight Bearing Restrictions: No    Mobility  Bed Mobility Overal bed mobility: Needs Assistance             General bed mobility comments: Pt sitting EOB at start and end of session discussed log roll  Transfers Overall transfer level: Needs assistance Equipment used: Rolling walker (2 wheeled) Transfers: Sit to/from Stand Sit to Stand: Min guard         General transfer comment: minG for safety due to pain, significant forward flexion, cues for hip extension  with stand. x5 from recliner  Ambulation/Gait Ambulation/Gait assistance: Min guard Gait Distance (Feet): 75 Feet Assistive device: Rolling walker (2 wheeled) Gait Pattern/deviations: Step-through pattern;Decreased stride length;Trunk flexed Gait velocity: decreased Gait velocity interpretation: <1.31 ft/sec, indicative of household ambulator General Gait Details: pt had a flexed trunk and kept RW too far forward and needed multiple cues for posture and keeping the RW nearby throughout gait      Balance Overall balance assessment: Needs assistance Sitting-balance support: No upper extremity supported;Feet supported Sitting balance-Leahy Scale: Good     Standing balance support: Bilateral upper extremity supported;During functional activity Standing balance-Leahy Scale: Poor Standing balance comment: multiple minor LOB without BUE support                            Cognition Arousal/Alertness: Awake/alert Behavior During Therapy: WFL for tasks assessed/performed Overall Cognitive Status: Within Functional Limits for tasks assessed                                 General Comments: Pt with some internal distraction by pain, otherwise functional      Exercises General Exercises - Lower Extremity Long Arc Quad: Strengthening;Both;10 reps;Seated (against min resistance knee extension and flexion) Heel Raises: AROM;Both;15 reps;Seated    General Comments General comments (skin integrity, edema, etc.): drain removed, VSS on RA      Pertinent Vitals/Pain Pain Assessment: Faces Faces Pain Scale: Hurts whole lot Pain  Location: back and abdomen Pain Descriptors / Indicators: Discomfort;Grimacing;Sharp;Sore Pain Intervention(s): Limited activity within patient's tolerance;Monitored during session;RN gave pain meds during session           PT Goals (current goals can now be found in the care plan section) Acute Rehab PT Goals Patient Stated Goal: less  pain PT Goal Formulation: With patient Time For Goal Achievement: 02/07/20 Potential to Achieve Goals: Good Progress towards PT goals: Progressing toward goals    Frequency    Min 5X/week      PT Plan Current plan remains appropriate       AM-PAC PT "6 Clicks" Mobility   Outcome Measure  Help needed turning from your back to your side while in a flat bed without using bedrails?: None Help needed moving from lying on your back to sitting on the side of a flat bed without using bedrails?: None Help needed moving to and from a bed to a chair (including a wheelchair)?: A Little Help needed standing up from a chair using your arms (e.g., wheelchair or bedside chair)?: A Little Help needed to walk in hospital room?: A Little Help needed climbing 3-5 steps with a railing? : A Little 6 Click Score: 20    End of Session Equipment Utilized During Treatment: Gait belt;Back brace Activity Tolerance: Patient limited by pain Patient left: in bed;with call bell/phone within reach (sitting EOB) Nurse Communication: Mobility status PT Visit Diagnosis: Other abnormalities of gait and mobility (R26.89);Unsteadiness on feet (R26.81);Muscle weakness (generalized) (M62.81)     Time: 5027-7412 PT Time Calculation (min) (ACUTE ONLY): 30 min  Charges:  $Gait Training: 8-22 mins $Therapeutic Exercise: 8-22 mins                     Karma Ganja, PT, DPT   Acute Rehabilitation Department Pager #: [phone]   Otho Bellows 01/27/2020, 11:17 AM

## 2020-01-27 NOTE — Care Management Important Message (Signed)
Important Message  Patient Details  Name: Angie Olson MRN: [id number] Date of Birth: Aug 16, 1950   Medicare Important Message Given:  Yes     Aleshia Cartelli Montine Circle 01/27/2020, 3:51 PM

## 2020-01-28 LAB — PREPARE RBC (CROSSMATCH)

## 2020-01-28 LAB — IRON AND TIBC
Iron: 17 ug/dL — ABNORMAL LOW (ref 28–170)
Saturation Ratios: 6 % — ABNORMAL LOW (ref 10.4–31.8)
TIBC: 288 ug/dL (ref 250–450)
UIBC: 271 ug/dL

## 2020-01-28 LAB — FERRITIN: Ferritin: 75 ng/mL (ref 11–307)

## 2020-01-28 MED ORDER — SODIUM CHLORIDE 0.9% IV SOLUTION: Freq: Once | INTRAVENOUS | Status: AC

## 2020-01-28 MED ORDER — MAGNESIUM CITRATE PO SOLN
1.0000 | Freq: Once | ORAL | Status: AC
  Administered 2020-01-28: 1 via ORAL
  Filled 2020-01-28: qty 296

## 2020-01-28 MED ORDER — FLEET ENEMA 7-19 GM/118ML RE ENEM: 1.0000 | ENEMA | Freq: Every day | RECTAL | Status: DC | PRN

## 2020-01-28 NOTE — Progress Notes (Signed)
Subjective: Patient reports feeling tired, ill.  No shortness of breath  Objective: Vital signs in last 24 hours: Temp:  [98.7 F (37.1 C)-99.9 F (37.7 C)] 98.7 F (37.1 C) (11/13 0854) Pulse Rate:  [87-97] 87 (11/13 1004) Resp:  [16] 16 (11/13 0854) BP: (107-136)/(53-64) 107/55 (11/13 1004) SpO2:  [89 %-96 %] 92 % (11/13 0854)  Intake/Output from previous day: 11/12 0701 - 11/13 0700 In: 674.9 [P.O.:480; I.V.:194.9] Out: -  Intake/Output this shift: No intake/output data recorded.  Pale appearing No acute distress Incision c/d, no drainage or discoloration at wound  Lab Results: Recent Labs    01/27/20 1237  WBC 7.1  HGB 7.3*  HCT 23.4*  PLT 318   BMET No results for input(s): NA, K, CL, CO2, GLUCOSE, BUN, CREATININE, CALCIUM in the last 72 hours.  Studies/Results: No results found.  Assessment/Plan: 69 yo F s/p revision thoracolumbar fusion -anemia is worse today at 7.3, and she does have some mild tachycardia and lethargy likely related to this.  In talking with the patient, she said she was recently diagnosed with iron deficiency anemia and iron supplementation was due to be started soon. -We will plan to transfuse 2 units PRBCs and recheck hemoglobin in morning -I will also check iron saturation and ferritin levels; she is fairly constipated, so I am hesitant to start iron until her constipation is resolved. -Fleets enema as needed   Vallarie Mare 01/28/2020, 11:40 AM

## 2020-01-28 NOTE — Plan of Care (Signed)

## 2020-01-28 NOTE — Progress Notes (Signed)
Physical Therapy Treatment Patient Details Name: Angie Olson MRN: [id number] DOB: Nov 26, 1950 Today's Date: 01/28/2020    History of Present Illness Pt is a 69 yo female s/p Pedicle screw fixation from T10-L4.  Pt with previous back surgeries (pt reports this is #10).  Pt also with old L shoulder surgery, B THA, B knee arthroscopic surgery, and is in need of cataract surgery.    PT Comments    The pt remains highly motivated to participate in therapy despite obvious pain in her abdomen due to lack of a BM for 5 days. The pt was able to complete a series of seated and standing exercises, but required seated rest due to fatigue and pain after each set to rest for 30 sec -1 min. The pt was able to demo good seated stability, reaching outside her BOS on multiple occasions without LOB, and will benefit from skilled PT to continue to progress functional stability, endurance, and gait prior to return home.     Follow Up Recommendations  Home health PT;Supervision for mobility/OOB     Equipment Recommendations  None recommended by PT    Recommendations for Other Services       Precautions / Restrictions Precautions Precautions: Back;Fall Precaution Booklet Issued: No Precaution Comments: able to demonstrate all precuations appropriately Required Braces or Orthoses: Spinal Brace Spinal Brace: Lumbar corset;Applied in sitting position Restrictions Weight Bearing Restrictions: No    Mobility  Bed Mobility Overal bed mobility: Needs Assistance Bed Mobility: Rolling;Sidelying to Sit Rolling: Min guard Sidelying to sit: Min guard       General bed mobility comments: minG for safety for pt to push to sitting and steady, no assist given  Transfers Overall transfer level: Needs assistance Equipment used: Rolling walker (2 wheeled);None Transfers: Sit to/from Stand Sit to Stand: Min guard         General transfer comment: minG for safety with RW, minA through HHA when AD  removed for stability  Ambulation/Gait             General Gait Details: able to take a few steps in room to complete exercises, pt generally declining ambulation today due to pain from constipation   Stairs             Wheelchair Mobility    Modified Rankin (Stroke Patients Only)       Balance Overall balance assessment: Needs assistance Sitting-balance support: No upper extremity supported;Feet supported Sitting balance-Leahy Scale: Good     Standing balance support: Single extremity supported;During functional activity Standing balance-Leahy Scale: Poor Standing balance comment: reliant on BUE support                            Cognition Arousal/Alertness: Awake/alert Behavior During Therapy: WFL for tasks assessed/performed Overall Cognitive Status: Within Functional Limits for tasks assessed                                 General Comments: Pt with some internal distraction by pain, otherwise functional      Exercises General Exercises - Lower Extremity Long Arc Quad: Strengthening;Both;10 reps;Seated (against min resistance flexion and extension) Hip ABduction/ADduction: Strengthening;Both;10 reps;Seated (against mod resistance) Heel Raises: Strengthening;Both;15 reps;Standing    General Comments General comments (skin integrity, edema, etc.): pt awaiting new IV placement to receive transfusion of blood.       Pertinent Vitals/Pain Pain Assessment: Faces  Faces Pain Scale: Hurts whole lot Pain Location: abdomen Pain Descriptors / Indicators: Discomfort;Grimacing;Sharp;Sore Pain Intervention(s): Limited activity within patient's tolerance;Monitored during session;Repositioned    Home Living                      Prior Function            PT Goals (current goals can now be found in the care plan section) Acute Rehab PT Goals Patient Stated Goal: less pain PT Goal Formulation: With patient Time For Goal  Achievement: 02/07/20 Potential to Achieve Goals: Good Progress towards PT goals: Progressing toward goals    Frequency    Min 5X/week      PT Plan Current plan remains appropriate    Co-evaluation              AM-PAC PT "6 Clicks" Mobility   Outcome Measure  Help needed turning from your back to your side while in a flat bed without using bedrails?: None Help needed moving from lying on your back to sitting on the side of a flat bed without using bedrails?: None Help needed moving to and from a bed to a chair (including a wheelchair)?: A Little Help needed standing up from a chair using your arms (e.g., wheelchair or bedside chair)?: A Little Help needed to walk in hospital room?: A Little Help needed climbing 3-5 steps with a railing? : A Little 6 Click Score: 20    End of Session Equipment Utilized During Treatment: Gait belt;Back brace Activity Tolerance: Patient limited by pain Patient left: in bed;with call bell/phone within reach (sitting EOB) Nurse Communication: Mobility status PT Visit Diagnosis: Other abnormalities of gait and mobility (R26.89);Unsteadiness on feet (R26.81);Muscle weakness (generalized) (M62.81)     Time: 4818-5909 PT Time Calculation (min) (ACUTE ONLY): 21 min  Charges:  $Therapeutic Exercise: 8-22 mins                     Karma Ganja, PT, DPT   Acute Rehabilitation Department Pager #: [phone]   Otho Bellows 01/28/2020, 4:17 PM

## 2020-01-29 LAB — TYPE AND SCREEN
ABO/RH(D): A NEG
Antibody Screen: NEGATIVE
Unit division: 0
Unit division: 0

## 2020-01-29 LAB — BPAM RBC
Blood Product Expiration Date: 202111252359
Blood Product Expiration Date: 202111252359
ISSUE DATE / TIME: 202111131547
ISSUE DATE / TIME: 202111131947
Unit Type and Rh: 600
Unit Type and Rh: 600

## 2020-01-29 LAB — HEMOGLOBIN AND HEMATOCRIT, BLOOD
HCT: 31.4 % — ABNORMAL LOW (ref 36.0–46.0)
Hemoglobin: 9.9 g/dL — ABNORMAL LOW (ref 12.0–15.0)

## 2020-01-29 MED ORDER — DIPHENHYDRAMINE HCL 25 MG PO CAPS: 25.0000 mg | ORAL_CAPSULE | Freq: Four times a day (QID) | ORAL | Status: DC | PRN

## 2020-01-29 NOTE — Progress Notes (Signed)
Patient ID: Angie Olson, female   DOB: [birth year], 69 y.o.   MRN: [id number] BP 118/63 (BP Location: Left Arm)   Pulse 88   Temp 98.3 F (36.8 C)   Resp 20   Ht 5\' 5"  (1.651 m)   Wt 78.1 kg   SpO2 96%   BMI 28.65 kg/m  Alert and oriented x 4, speech is clear and fluent Moving lower extremities Has had multiple bowel movements this morning Will give benadryl for itching around the incision

## 2020-01-30 MED ORDER — TIZANIDINE HCL 2 MG PO TABS: 2.0000 mg | ORAL_TABLET | Freq: Four times a day (QID) | ORAL | 0 refills | Status: DC | PRN

## 2020-01-30 MED ORDER — OXYCODONE-ACETAMINOPHEN 5-325 MG PO TABS
1.0000 | ORAL_TABLET | ORAL | 0 refills | Status: DC | PRN
Start: 2020-01-30 — End: 2020-07-23

## 2020-01-30 NOTE — TOC Transition Note (Signed)
Transition of Care (TOC) - CM/SW Discharge Note Marvetta Gibbons RN,BSN Transitions of Care Unit 4NP (non trauma) - RN Case Manager See Treatment Team for direct Phone #   Patient Details  Name: Angie Olson MRN: [id number] Date of Birth: 01-Nov-1950  Transition of Care [facility]) CM/SW Contact:  Dawayne Patricia, RN Phone Number: 01/30/2020, 11:58 AM   Clinical Narrative:    Pt stable for transition home today, orders placed for HHPT/OT- CM spoke with pt at bedside- confirmed address, phone # and PCP in epic. Pt agreeable to [facility] services- states she will stay with her daughter for a few days then will return home- would like to wait until she returns to her home to start [facility] services. Choice offered for [facility] needs with list Per CMS guidelines from medicare.gov website with star ratings (copy placed in shadow chart)- pt reports she has had both [facility] and [facility] in the past- and is ok with either of these. Pt states she has all needed DME.  Call made to [facility] with [facility] for [facility] referral- however they are unable to accept in pt's area due to staffing.. Call made to [facility] with [facility]- they are able to accept referral- OT services are on a delay but they can start with PT.     Final next level of care: [place] Barriers to Discharge: No Barriers Identified   Patient Goals and CMS Choice Patient states their goals for this hospitalization and ongoing recovery are:: to be able to walk better and standing straight CMS Medicare.gov Compare Post Acute Care list provided to:: Patient Choice offered to / list presented to : Patient  Discharge Placement               Home with [facility]        Discharge Plan and Services   Discharge Planning Services: CM Consult Post Acute Care Choice: Home Health          DME Arranged: N/A DME Agency: NA       HH Arranged: PT, OT [place] Agency: [facility] [employer] Date [place]: 01/30/20 Time [place]: 26 Representative spoke with at [place]: [place] ([place]) Interventions     Readmission Risk Interventions Readmission Risk Prevention Plan 01/30/2020  Transportation Screening Complete  PCP or Specialist Appt within 5-7 Days Complete  Home Care Screening Complete  Medication Review (RN CM) Complete  Some recent data might be hidden

## 2020-01-30 NOTE — Plan of Care (Signed)
  Problem: Education: Goal: Knowledge of General Education information will improve Description: Including pain rating scale, medication(s)/side effects and non-pharmacologic comfort measures Outcome: Completed/Met   Problem: Health Behavior/Discharge Planning: Goal: Ability to manage health-related needs will improve Outcome: Completed/Met   Problem: Clinical Measurements: Goal: Ability to maintain clinical measurements within normal limits will improve Outcome: Completed/Met Goal: Will remain free from infection Outcome: Completed/Met Goal: Diagnostic test results will improve Outcome: Completed/Met Goal: Respiratory complications will improve Outcome: Completed/Met Goal: Cardiovascular complication will be avoided Outcome: Completed/Met   Problem: Activity: Goal: Risk for activity intolerance will decrease Outcome: Completed/Met   Problem: Coping: Goal: Level of anxiety will decrease Outcome: Completed/Met   Problem: Pain Managment: Goal: General experience of comfort will improve Outcome: Completed/Met   Problem: Safety: Goal: Ability to remain free from injury will improve Outcome: Completed/Met   Problem: Skin Integrity: Goal: Risk for impaired skin integrity will decrease Outcome: Completed/Met   

## 2020-01-30 NOTE — Progress Notes (Addendum)
Discharge instructions reviewed with pt. All questions answered. Pt already has f/u N/S appt. scheduled within 2 weeks. Pt will be staying with her daughter for the first few days and upon return home will initiate [facility] services. Pt escorted with all belongings in [facility] to pt's daughter's vehicle to be discharged.

## 2020-01-30 NOTE — Discharge Summary (Signed)
Physician Discharge Summary  Patient ID: Angie Olson MRN: [id number] DOB/AGE: [birth year] 69 y.o.  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 The patient tolerated the procedure well and was taken to the recovery room and then to the floor in stable condition. The hospital course was routine. There were no complications. The wound remained clean dry and intact. Pt had appropriate back soreness. No complaints of leg pain or new N/T/W. The patient remained afebrile with stable vital signs, and tolerated a regular diet. The patient continued to increase activities, and pain was well controlled with oral pain medications.   Consults: None  Significant Diagnostic Studies:  Results for orders placed or performed during the hospital encounter of 28/41/32  Basic metabolic panel  Result Value Ref Range   Sodium 138 135 - 145 mmol/L   Potassium 3.8 3.5 - 5.1 mmol/L   Chloride 103 98 - 111 mmol/L   CO2 25 22 - 32 mmol/L   Glucose, Bld 115 (H) 70 - 99 mg/dL   BUN 15 8 - 23 mg/dL   Creatinine, Ser 0.73 0.44 - 1.00 mg/dL   Calcium 8.1 (L) 8.9 - 10.3 mg/dL   GFR, Estimated >60 >60 mL/min   Anion gap 10 5 - 15  CBC with Differential/Platelet  Result Value Ref Range   WBC 14.3 (H) 4.0 - 10.5 K/uL   RBC 3.32 (L) 3.87 - 5.11 MIL/uL   Hemoglobin 8.1 (L) 12.0 - 15.0 g/dL   HCT 26.7 (L) 36 - 46 %   MCV 80.4 80.0 - 100.0 fL   MCH 24.4 (L) 26.0 - 34.0 pg   MCHC 30.3 30.0 - 36.0 g/dL   RDW 15.9 (H) 11.5 - 15.5 %   Platelets 378 150 - 400 K/uL   nRBC 0.0 0.0 - 0.2 %   Neutrophils Relative % 79 %   Neutro Abs 11.4 (H) 1.7 - 7.7 K/uL   Lymphocytes Relative 10 %   Lymphs Abs 1.4 0.7 - 4.0 K/uL   Monocytes Relative 10  %   Monocytes Absolute 1.4 (H) 0.1 - 1.0 K/uL   Eosinophils Relative 0 %   Eosinophils Absolute 0.0 0.0 - 0.5 K/uL   Basophils Relative 0 %   Basophils Absolute 0.0 0.0 - 0.1 K/uL   Immature Granulocytes 1 %   Abs Immature Granulocytes 0.07 0.00 - 0.07 K/uL  CBC  Result Value Ref Range   WBC 7.1 4.0 - 10.5 K/uL   RBC 2.90 (L) 3.87 - 5.11 MIL/uL   Hemoglobin 7.3 (L) 12.0 - 15.0 g/dL   HCT 23.4 (L) 36 - 46 %   MCV 80.7 80.0 - 100.0 fL   MCH 25.2 (L) 26.0 - 34.0 pg   MCHC 31.2 30.0 - 36.0 g/dL   RDW 15.9 (H) 11.5 - 15.5 %   Platelets 318 150 - 400 K/uL   nRBC 0.0 0.0 - 0.2 %  Ferritin  Result Value Ref Range   Ferritin 75 11 - 307 ng/mL  Iron and TIBC  Result Value Ref Range   Iron 17 (L) 28 - 170 ug/dL   TIBC 288 250 - 450 ug/dL   Saturation Ratios 6 (L) 10.4 - 31.8 %   UIBC 271 ug/dL  Hemoglobin and hematocrit, blood  Result  Value Ref Range   Hemoglobin 9.9 (L) 12.0 - 15.0 g/dL   HCT 31.4 (L) 36 - 46 %  Prepare RBC (crossmatch)  Result Value Ref Range   Order Confirmation      ORDER PROCESSED BY BLOOD BANK Performed at [facility] [address] [place], [place] [address]     DG THORACOLUMABAR SPINE  Result Date: 01/23/2020 CLINICAL DATA:  PLIF T10-L1 EXAM: THORACOLUMBAR SPINE 1V COMPARISON:  CT 06/14/2019 FINDINGS: C-arm images show placement of pedicle screws at T10, T11, T12 and L1 posterior hardware at L2 previously seen has been removed. IMPRESSION: Interval placement of pedicle screws at T10, T11, T12 and L1. Electronically Signed   By: Nelson Chimes M.D.   On: 01/23/2020 17:07   DG Abd 1 View  Result Date: 01/24/2020 CLINICAL DATA:  Recent back surgery.  Epigastric pain and bloating. EXAM: ABDOMEN - 1 VIEW COMPARISON:  07/21/2014.  CT abdomen pelvis 01/31/2019. FINDINGS: Nonobstructive bowel gas pattern. Gas in nondilated stomach. Mild amount of stool in the colon. Surgical drain in the upper lumbar spine. Interval revision of lumbar fusion. Bilateral  pedicle screw and rod fusion extending from T10 through L4. Interbody spacers L2-3, L3-4, L4-5, L5-S1. IMPRESSION: Mildly distended stomach. Negative for ileus. Mild amount of stool in the right colon. Electronically Signed   By: Franchot Gallo M.D.   On: 01/24/2020 08:37   DG C-Arm 1-60 Min  Result Date: 01/23/2020 CLINICAL DATA:  PLIF T10-L1 EXAM: THORACOLUMBAR SPINE 1V COMPARISON:  CT 06/14/2019 FINDINGS: C-arm images show placement of pedicle screws at T10, T11, T12 and L1 posterior hardware at L2 previously seen has been removed. IMPRESSION: Interval placement of pedicle screws at T10, T11, T12 and L1. Electronically Signed   By: Nelson Chimes M.D.   On: 01/23/2020 17:07    Antibiotics:  Anti-infectives (From admission, onward)   Start     Dose/Rate Route Frequency Ordered Stop   01/23/20 2100  hydroxychloroquine (PLAQUENIL) tablet 200 mg        200 mg Oral Daily 01/23/20 1551     01/23/20 1600  ceFAZolin (ANCEF) IVPB 2g/100 mL premix        2 g 200 mL/hr over 30 Minutes Intravenous Every 8 hours 01/23/20 1551 01/25/20 0822   01/23/20 1553  ceFAZolin (ANCEF) 2-4 GM/100ML-% IVPB       Note to Pharmacy: Cleotis Nipper   : cabinet override      01/23/20 1553 01/24/20 0359   01/23/20 0600  ceFAZolin (ANCEF) IVPB 2g/100 mL premix        2 g 200 mL/hr over 30 Minutes Intravenous On call to O.R. 01/23/20 0554 01/23/20 0800      Discharge Exam: Blood pressure (!) 148/66, pulse 92, temperature 98.7 F (37.1 C), resp. rate 13, height 5\' 5"  (1.651 m), weight 78.1 kg, SpO2 99 %. Neurologic: Grossly normal Ambulating and voiding well, incision cdi  Discharge Medications:   Allergies as of 01/30/2020      Reactions   Methotrexate Derivatives Other (See Comments)   Causes recurrent infections   Pseudoephedrine Other (See Comments)   Makes jittery    Reglan [metoclopramide] Other (See Comments)   insomnia   Vicodin [hydrocodone-acetaminophen] Other (See Comments)   "Makes me feel weird"    Other Other (See Comments)   Pollen / Closes Sinus   Oxycodone Itching, Rash   Patient tolerated hydromorphone without reaction.      Medication List    TAKE these medications  Advil Allergy Sinus 2-30-200 MG Tabs Generic drug: Chlorpheniramine-PSE-Ibuprofen Take 1 tablet by mouth daily as needed (Sinus headache).   ALPRAZolam 1 MG tablet Commonly known as: XANAX Take 1 mg by mouth 2 (two) times daily as needed for anxiety.   amLODipine 5 MG tablet Commonly known as: NORVASC Take 5 mg by mouth daily.   APPLE CIDER VINEGAR PO Take 1 tablet by mouth daily. [place]   ascorbic acid 500 MG tablet Commonly known as: VITAMIN C Take 500 mg by mouth daily.   atorvastatin 20 MG tablet Commonly known as: LIPITOR Take 20 mg by mouth daily.   cetirizine 10 MG tablet Commonly known as: ZYRTEC Take 10 mg by mouth daily.   esomeprazole 20 MG capsule Commonly known as: NEXIUM Take 20 mg by mouth daily before breakfast.   gabapentin 300 MG capsule Commonly known as: NEURONTIN Take 600 mg by mouth 3 (three) times daily.   hydroxychloroquine 200 MG tablet Commonly known as: PLAQUENIL Take 200 mg by mouth daily.   leflunomide 20 MG tablet Commonly known as: ARAVA Take 20 mg by mouth daily.   lisinopril 40 MG tablet Commonly known as: ZESTRIL Take 40 mg by mouth daily.   multivitamin with minerals tablet Take 1 tablet by mouth daily. 50 plus   Hair/Skin/Nails Caps Take 1 tablet by mouth daily.   Neuriva Plus Caps Take 1 tablet by mouth daily.   nortriptyline 25 MG capsule Commonly known as: PAMELOR Take 25 mg by mouth at bedtime.   OVER THE COUNTER MEDICATION Take 1 capsule by mouth daily. Total Beets   oxyCODONE-acetaminophen 5-325 MG tablet Commonly known as: Percocet Take 1 tablet by mouth every 4 (four) hours as needed for severe pain.   predniSONE 5 MG tablet Commonly known as: DELTASONE Take 5 mg by mouth daily. Continuous for rheumatoid arthritis    sucralfate 1 g tablet Commonly known as: CARAFATE Take 1 g by mouth 3 (three) times daily as needed (indigestion).   tiZANidine 2 MG tablet Commonly known as: ZANAFLEX Take 1 tablet (2 mg total) by mouth every 6 (six) hours as needed for muscle spasms.   vitamin B-12 1000 MCG tablet Commonly known as: CYANOCOBALAMIN Take 1,000 mcg by mouth daily.       Disposition: home   Final Dx: T10-L4 fusion  Discharge Instructions    Call MD for:  difficulty breathing, headache or visual disturbances   Complete by: As directed    Call MD for:  hives   Complete by: As directed    Call MD for:  persistant dizziness or light-headedness   Complete by: As directed    Call MD for:  persistant nausea and vomiting   Complete by: As directed    Call MD for:  redness, tenderness, or signs of infection (pain, swelling, redness, odor or green/yellow discharge around incision site)   Complete by: As directed    Call MD for:  severe uncontrolled pain   Complete by: As directed    Diet - low sodium heart healthy   Complete by: As directed    Driving Restrictions   Complete by: As directed    No driving for 2 weeks, no riding in the car for 1 week   Increase activity slowly   Complete by: As directed    Lifting restrictions   Complete by: As directed    No lifting more than 8 lbs   Remove dressing in 24 hours   Complete by: As directed  Signed: Ocie Cornfield Meagan Ancona 01/30/2020, 9:21 AM

## 2020-01-30 NOTE — Progress Notes (Signed)
Occupational Therapy Treatment Patient Details Name: Angie Olson MRN: [id number] DOB: 06/01/1950 Today's Date: 01/30/2020    History of present illness Pt is a 69 yo female s/p Pedicle screw fixation from T10-L4.  Pt with previous back surgeries (pt reports this is #10).  Pt also with old L shoulder surgery, B THA, B knee arthroscopic surgery, and is in need of cataract surgery.   OT comments  Pt making slow but very steady progress. Pt now able to dress LEs with no adaptive equipment. Pt crosses legs in figure 4 position to donn socks and shoes and can reach feet to bathe. Pt walking to bathroom with occasional min guard and toileting with supervision on comfort commode with walker. Pt does continue to complain of a lot of pain and her adl endurance is limited due to this pain but she is able to do more for herself each session. Pt to d/c home with her daughter for awhile before moving back alone.  Rec HHOT at dc to address home set up and safety with adls.   Follow Up Recommendations  Home health OT;Supervision - Intermittent    Equipment Recommendations  None recommended by OT    Recommendations for Other Services      Precautions / Restrictions Precautions Precautions: Back;Fall Precaution Comments: able to demonstrate all precuations appropriately Required Braces or Orthoses: Spinal Brace Spinal Brace: Lumbar corset;Applied in sitting position Restrictions Weight Bearing Restrictions: No       Mobility Bed Mobility               General bed mobility comments: Pt was on EOB on arrival.  Transfers Overall transfer level: Needs assistance Equipment used: Rolling walker (2 wheeled);None Transfers: Sit to/from Stand Sit to Stand: Supervision         General transfer comment: supervision for transfers. Min guard to ambulate    Balance Overall balance assessment: Needs assistance Sitting-balance support: No upper extremity supported;Feet supported Sitting  balance-Leahy Scale: Good     Standing balance support: Bilateral upper extremity supported;During functional activity Standing balance-Leahy Scale: Fair Standing balance comment: Pt requires walker to stand. Pt able to let go of walker for very short amounts of time during adls but overall dependent on it for more than a few seconds.                            ADL either performed or assessed with clinical judgement   ADL Overall ADL's : Needs assistance/impaired Eating/Feeding: Set up;Sitting   Grooming: Wash/dry hands;Wash/dry face;Oral care;Supervision/safety;Standing Grooming Details (indicate cue type and reason): Pt stood at sink for 4 min with no loss of balance but fatigues quickly. Upper Body Bathing: Set up;Sitting   Lower Body Bathing: Minimal assistance;Sit to/from stand;Cueing for compensatory techniques;Cueing for back precautions Lower Body Bathing Details (indicate cue type and reason): Pt can sit in figure 4 position to bathe feet and lower legs.  Pt can stand to reach bottom to bathe and requires min assist to avoid twisting. Upper Body Dressing : Set up;Cueing for compensatory techniques;Sitting Upper Body Dressing Details (indicate cue type and reason): Pt donned brace with no assist other than cues to get it centered. Lower Body Dressing: Minimal assistance;Sit to/from stand;Cueing for compensatory techniques Lower Body Dressing Details (indicate cue type and reason): Pt crossed legs easily to donn socks and shoes. Pt instructed to donn underwear and pants in same fashion.  Toilet Transfer: Chief of Staff  Details (indicate cue type and reason): Pt walked to bathroom with min guard assist. Pt states she walks to bathroom with no assist during the day.  REminded pt to ask for assist for safety. Toileting- Clothing Manipulation and Hygiene: Supervision/safety;Sitting/lateral lean;Cueing for back precautions       Functional mobility  during ADLs: Min guard;Rolling walker General ADL Comments: Pt slowly progressing. Able to dress LE with crossing legs.  Pain continues to limit pts endurance with adls.     Vision   Vision Assessment?: No apparent visual deficits   Perception     Praxis      Cognition Arousal/Alertness: Awake/alert Behavior During Therapy: WFL for tasks assessed/performed Overall Cognitive Status: Within Functional Limits for tasks assessed                                 General Comments: Pt with some internal distraction by pain, otherwise functional        Exercises     Shoulder Instructions       General Comments Pt limited most by pain.    Pertinent Vitals/ Pain       Pain Assessment: 0-10 Pain Score: 7  Pain Location: back Pain Descriptors / Indicators: Discomfort;Grimacing;Sharp;Sore Pain Intervention(s): Limited activity within patient's tolerance;Monitored during session;Patient requesting pain meds-RN notified;Repositioned;Relaxation  Home Living                                          Prior Functioning/Environment              Frequency  Min 2X/week        Progress Toward Goals  OT Goals(current goals can now be found in the care plan section)  Progress towards OT goals: Progressing toward goals  Acute Rehab OT Goals Patient Stated Goal: less pain OT Goal Formulation: With patient Time For Goal Achievement: 02/07/20 Potential to Achieve Goals: Good ADL Goals Pt Will Perform Lower Body Bathing: with modified independence Pt Will Perform Lower Body Dressing: with modified independence Pt Will Perform Tub/Shower Transfer: Shower transfer;shower seat;rolling walker;with supervision Additional ADL Goal #1: Pt will walk to bathroom with walker and complete all toileting on raised commode with mod I.  Plan Discharge plan remains appropriate    Co-evaluation                 AM-PAC OT "6 Clicks" Daily Activity      Outcome Measure   Help from another person eating meals?: None Help from another person taking care of personal grooming?: None Help from another person toileting, which includes using toliet, bedpan, or urinal?: None Help from another person bathing (including washing, rinsing, drying)?: A Little Help from another person to put on and taking off regular upper body clothing?: None Help from another person to put on and taking off regular lower body clothing?: A Little 6 Click Score: 22    End of Session Equipment Utilized During Treatment: Rolling walker;Gait belt  OT Visit Diagnosis: Unsteadiness on feet (R26.81);Pain Pain - part of body:  (back)   Activity Tolerance Patient limited by pain   Patient Left in chair;with call bell/phone within reach   Nurse Communication Mobility status        Time: 0160-1093 OT Time Calculation (min): 24 min  Charges: OT General Charges $OT Visit: 1 Visit OT  Treatments $Self Care/Home Management : 23-37 mins   Glenford Peers 01/30/2020, 10:37 AM

## 2020-03-01 ENCOUNTER — Encounter (HOSPITAL_COMMUNITY): Payer: Self-pay | Admitting: Neurosurgery

## 2020-06-04 ENCOUNTER — Other Ambulatory Visit: Payer: Self-pay | Admitting: Neurosurgery

## 2020-06-04 DIAGNOSIS — M4807 Spinal stenosis, lumbosacral region: Secondary | ICD-10-CM

## 2020-06-20 ENCOUNTER — Ambulatory Visit
Admission: RE | Admit: 2020-06-20 | Discharge: 2020-06-20 | Disposition: A | Discharge status: Home / Self Care | Payer: Medicare Other | Source: Ambulatory Visit | Attending: Neurosurgery | Admitting: Neurosurgery

## 2020-06-20 DIAGNOSIS — M4807 Spinal stenosis, lumbosacral region: Secondary | ICD-10-CM

## 2020-07-03 ENCOUNTER — Other Ambulatory Visit: Payer: Self-pay | Admitting: Neurosurgery

## 2020-07-27 NOTE — Pre-Procedure Instructions (Signed)
Surgical Instructions    Your procedure is scheduled on Wednesday, May 18th.  Report to [facility] Main Entrance "A" at 6:30 A.M., then check in with the Admitting office.  Call this number if you have problems the morning of surgery:  [phone]   If you have any questions prior to your surgery date call [phone]: Open Monday-Friday 8am-4pm    Remember:  Do not eat or drink after midnight the night before your surgery    Take these medicines the morning of surgery with A SIP OF WATER  amLODipine (NORVASC)  atorvastatin (LIPITOR)  cetirizine (ZYRTEC) esomeprazole (NEXIUM) gabapentin (NEURONTIN)  hydroxychloroquine (PLAQUENIL) predniSONE (DELTASONE) HYDROcodone-acetaminophen (NORCO)-as needed sodium chloride (OCEAN)-as needed  As of today, STOP taking any Aspirin (unless otherwise instructed by your surgeon) Aleve, Naproxen, Ibuprofen, Motrin, Advil, Goody's, BC's, all herbal medications, fish oil, and all vitamins.                     Do NOT Smoke (Tobacco/Vaping) or drink Alcohol 24 hours prior to your procedure.  If you use a CPAP at night, you may bring all equipment for your overnight stay.   Contacts, glasses, piercing's, hearing aid's, dentures or partials may not be worn into surgery, please bring cases for these belongings.    For patients admitted to the hospital, discharge time will be determined by your treatment team.   Patients discharged the day of surgery will not be allowed to drive home, and someone needs to stay with them for 24 hours.    Special instructions:   [facility]- Preparing For Surgery  Before surgery, you can play an important role. Because skin is not sterile, your skin needs to be as free of germs as possible. You can reduce the number of germs on your skin by washing with CHG (chlorahexidine gluconate) Soap before surgery.  CHG is an antiseptic cleaner which kills germs and bonds with the skin to continue killing germs even after  washing.    Oral Hygiene is also important to reduce your risk of infection.  Remember - BRUSH YOUR TEETH THE MORNING OF SURGERY WITH YOUR REGULAR TOOTHPASTE  Please do not use if you have an allergy to CHG or antibacterial soaps. If your skin becomes reddened/irritated stop using the CHG.  Do not shave (including legs and underarms) for at least 48 hours prior to first CHG shower. It is OK to shave your face.  Please follow these instructions carefully.   1. Shower the NIGHT BEFORE SURGERY and the MORNING OF SURGERY  2. If you chose to wash your hair, wash your hair first as usual with your normal shampoo.  3. After you shampoo, rinse your hair and body thoroughly to remove the shampoo.  4. Use CHG Soap as you would any other liquid soap. You can apply CHG directly to the skin and wash gently with a scrungie or a clean washcloth.   5. Apply the CHG Soap to your body ONLY FROM THE NECK DOWN.  Do not use on open wounds or open sores. Avoid contact with your eyes, ears, mouth and genitals (private parts). Wash Face and genitals (private parts)  with your normal soap.   6. Wash thoroughly, paying special attention to the area where your surgery will be performed.  7. Thoroughly rinse your body with warm water from the neck down.  8. DO NOT shower/wash with your normal soap after using and rinsing off the CHG Soap.  9. Pat yourself dry  with a CLEAN TOWEL.  10. Wear CLEAN PAJAMAS to bed the night before surgery  11. Place CLEAN SHEETS on your bed the night before your surgery  12. DO NOT SLEEP WITH PETS.   Day of Surgery: Shower with CHG soap. Do not wear jewelry, make up, or nail polish Do not wear lotions, powders, perfumes, or deodorant. Do not shave 48 hours prior to surgery.  Do not bring valuables to the hospital. [facility] is not responsible for any belongings or valuables. Wear Clean/Comfortable clothing the morning of surgery Remember to brush your teeth WITH YOUR  REGULAR TOOTHPASTE.   Please read over the following fact sheets that you were given.

## 2020-07-30 ENCOUNTER — Encounter (HOSPITAL_COMMUNITY): Payer: Self-pay

## 2020-07-30 ENCOUNTER — Other Ambulatory Visit: Payer: Self-pay

## 2020-07-30 ENCOUNTER — Encounter (HOSPITAL_COMMUNITY)
Admission: RE | Admit: 2020-07-30 | Discharge: 2020-07-30 | Disposition: A | Discharge status: Home / Self Care | Payer: Medicare Other | Source: Ambulatory Visit | Attending: Neurosurgery | Admitting: Neurosurgery

## 2020-07-30 DIAGNOSIS — Z20822 Contact with and (suspected) exposure to covid-19: Secondary | ICD-10-CM | POA: Insufficient documentation

## 2020-07-30 DIAGNOSIS — Z01812 Encounter for preprocedural laboratory examination: Secondary | ICD-10-CM | POA: Insufficient documentation

## 2020-07-30 LAB — CBC
HCT: 33.8 % — ABNORMAL LOW (ref 36.0–46.0)
Hemoglobin: 10.4 g/dL — ABNORMAL LOW (ref 12.0–15.0)
MCH: 25.3 pg — ABNORMAL LOW (ref 26.0–34.0)
MCHC: 30.8 g/dL (ref 30.0–36.0)
MCV: 82.2 fL (ref 80.0–100.0)
Platelets: 332 10*3/uL (ref 150–400)
RBC: 4.11 MIL/uL (ref 3.87–5.11)
RDW: 16.4 % — ABNORMAL HIGH (ref 11.5–15.5)
WBC: 6.6 10*3/uL (ref 4.0–10.5)
nRBC: 0 % (ref 0.0–0.2)

## 2020-07-30 LAB — BASIC METABOLIC PANEL
Anion gap: 7 (ref 5–15)
BUN: 15 mg/dL (ref 8–23)
CO2: 26 mmol/L (ref 22–32)
Calcium: 8.5 mg/dL — ABNORMAL LOW (ref 8.9–10.3)
Chloride: 107 mmol/L (ref 98–111)
Creatinine, Ser: 0.76 mg/dL (ref 0.44–1.00)
GFR, Estimated: >60 mL/min (ref >60)
Glucose, Bld: 126 mg/dL — ABNORMAL HIGH (ref 70–99)
Potassium: 4.1 mmol/L (ref 3.5–5.1)
Sodium: 140 mmol/L (ref 135–145)

## 2020-07-30 LAB — TYPE AND SCREEN
ABO/RH(D): A NEG
Antibody Screen: NEGATIVE

## 2020-07-30 LAB — SURGICAL PCR SCREEN
MRSA, PCR: NEGATIVE
Staphylococcus aureus: NEGATIVE

## 2020-07-30 LAB — HEMOGLOBIN A1C
Hgb A1c MFr Bld: 5.8 % — ABNORMAL HIGH (ref 4.8–5.6)
Mean Plasma Glucose: 119.76 mg/dL

## 2020-07-30 NOTE — Progress Notes (Signed)
PCP - Dr. Velna Hatchet Cardiologist - denies  Chest x-ray - n/a EKG - 114/21 Stress Test -2015  ECHO - 2015 Cardiac Cath -denies   Sleep Study - denies CPAP - denies  Blood Thinner Instructions: n/a Aspirin Instructions: n/a  COVID TEST-07/30/20 pending, quarantine instructions provided  Anesthesia review: No  Patient denies shortness of breath, fever, cough and chest pain at PAT appointment   All instructions explained to the patient, with a verbal understanding of the material. Patient agrees to go over the instructions while at home for a better understanding. Patient also instructed to self quarantine after being tested for COVID-19. The opportunity to ask questions was provided.

## 2020-07-31 LAB — SARS CORONAVIRUS 2 (TAT 6-24 HRS): SARS Coronavirus 2: NEGATIVE

## 2020-08-01 ENCOUNTER — Inpatient Hospital Stay (HOSPITAL_COMMUNITY): Payer: Medicare Other | Admitting: Anesthesiology

## 2020-08-01 ENCOUNTER — Encounter (HOSPITAL_COMMUNITY)
Admission: RE | Disposition: A | Discharge status: Home / Self Care | Payer: Self-pay | Source: Home / Self Care | Attending: Neurosurgery

## 2020-08-01 ENCOUNTER — Other Ambulatory Visit: Payer: Self-pay

## 2020-08-01 ENCOUNTER — Inpatient Hospital Stay (HOSPITAL_COMMUNITY): Payer: Medicare Other

## 2020-08-01 ENCOUNTER — Inpatient Hospital Stay (HOSPITAL_COMMUNITY)
Admission: RE | Admit: 2020-08-01 | Discharge: 2020-08-02 | DRG: 460 | Disposition: A | Discharge status: Home / Self Care | Payer: Medicare Other | Attending: Neurosurgery | Admitting: Neurosurgery

## 2020-08-01 ENCOUNTER — Encounter (HOSPITAL_COMMUNITY): Payer: Self-pay | Admitting: Neurosurgery

## 2020-08-01 DIAGNOSIS — Z8616 Personal history of COVID-19: Secondary | ICD-10-CM

## 2020-08-01 DIAGNOSIS — Z8249 Family history of ischemic heart disease and other diseases of the circulatory system: Secondary | ICD-10-CM | POA: Diagnosis not present

## 2020-08-01 DIAGNOSIS — Z20822 Contact with and (suspected) exposure to covid-19: Secondary | ICD-10-CM | POA: Diagnosis present

## 2020-08-01 DIAGNOSIS — Z801 Family history of malignant neoplasm of trachea, bronchus and lung: Secondary | ICD-10-CM | POA: Diagnosis not present

## 2020-08-01 DIAGNOSIS — M960 Pseudarthrosis after fusion or arthrodesis: Principal | ICD-10-CM | POA: Diagnosis present

## 2020-08-01 DIAGNOSIS — S32059A Unspecified fracture of fifth lumbar vertebra, initial encounter for closed fracture: Secondary | ICD-10-CM | POA: Diagnosis present

## 2020-08-01 DIAGNOSIS — Z87442 Personal history of urinary calculi: Secondary | ICD-10-CM | POA: Diagnosis not present

## 2020-08-01 DIAGNOSIS — Z888 Allergy status to other drugs, medicaments and biological substances status: Secondary | ICD-10-CM | POA: Diagnosis not present

## 2020-08-01 DIAGNOSIS — M19012 Primary osteoarthritis, left shoulder: Secondary | ICD-10-CM | POA: Diagnosis present

## 2020-08-01 DIAGNOSIS — F419 Anxiety disorder, unspecified: Secondary | ICD-10-CM | POA: Diagnosis present

## 2020-08-01 DIAGNOSIS — Z885 Allergy status to narcotic agent status: Secondary | ICD-10-CM

## 2020-08-01 DIAGNOSIS — M069 Rheumatoid arthritis, unspecified: Secondary | ICD-10-CM | POA: Diagnosis present

## 2020-08-01 DIAGNOSIS — Z79899 Other long term (current) drug therapy: Secondary | ICD-10-CM | POA: Diagnosis not present

## 2020-08-01 DIAGNOSIS — Z8673 Personal history of transient ischemic attack (TIA), and cerebral infarction without residual deficits: Secondary | ICD-10-CM | POA: Diagnosis not present

## 2020-08-01 DIAGNOSIS — I10 Essential (primary) hypertension: Secondary | ICD-10-CM | POA: Diagnosis present

## 2020-08-01 DIAGNOSIS — Z96642 Presence of left artificial hip joint: Secondary | ICD-10-CM | POA: Diagnosis present

## 2020-08-01 DIAGNOSIS — Z96641 Presence of right artificial hip joint: Secondary | ICD-10-CM | POA: Diagnosis present

## 2020-08-01 DIAGNOSIS — Z85820 Personal history of malignant melanoma of skin: Secondary | ICD-10-CM

## 2020-08-01 DIAGNOSIS — Y792 Prosthetic and other implants, materials and accessory orthopedic devices associated with adverse incidents: Secondary | ICD-10-CM | POA: Diagnosis present

## 2020-08-01 DIAGNOSIS — Z825 Family history of asthma and other chronic lower respiratory diseases: Secondary | ICD-10-CM

## 2020-08-01 DIAGNOSIS — Z419 Encounter for procedure for purposes other than remedying health state, unspecified: Secondary | ICD-10-CM

## 2020-08-01 DIAGNOSIS — E7800 Pure hypercholesterolemia, unspecified: Secondary | ICD-10-CM | POA: Diagnosis present

## 2020-08-01 DIAGNOSIS — K219 Gastro-esophageal reflux disease without esophagitis: Secondary | ICD-10-CM | POA: Diagnosis present

## 2020-08-01 DIAGNOSIS — T84226A Displacement of internal fixation device of vertebrae, initial encounter: Secondary | ICD-10-CM | POA: Diagnosis present

## 2020-08-01 DIAGNOSIS — Z8719 Personal history of other diseases of the digestive system: Secondary | ICD-10-CM

## 2020-08-01 DIAGNOSIS — S32009K Unspecified fracture of unspecified lumbar vertebra, subsequent encounter for fracture with nonunion: Secondary | ICD-10-CM | POA: Diagnosis present

## 2020-08-01 HISTORY — PX: LAMINECTOMY WITH POSTERIOR LATERAL ARTHRODESIS LEVEL 1: SHX6335

## 2020-08-01 HISTORY — PX: HARDWARE REMOVAL: SHX979

## 2020-08-01 LAB — GLUCOSE, CAPILLARY: Glucose-Capillary: 103 mg/dL — ABNORMAL HIGH (ref 70–99)

## 2020-08-01 SURGERY — REMOVAL, HARDWARE
Anesthesia: General | Site: Spine Lumbar

## 2020-08-01 MED ORDER — ROCURONIUM BROMIDE 100 MG/10ML IV SOLN
INTRAVENOUS | Status: DC | PRN
  Administered 2020-08-01: 60 mg via INTRAVENOUS
  Administered 2020-08-01: 20 mg via INTRAVENOUS

## 2020-08-01 MED ORDER — PREDNISONE 5 MG PO TABS
5.0000 mg | ORAL_TABLET | Freq: Every day | ORAL | Status: DC
  Filled 2020-08-01: qty 1

## 2020-08-01 MED ORDER — LIDOCAINE HCL (CARDIAC) PF 100 MG/5ML IV SOSY
PREFILLED_SYRINGE | INTRAVENOUS | Status: DC | PRN
  Administered 2020-08-01: 60 mg via INTRAVENOUS

## 2020-08-01 MED ORDER — THROMBIN 20000 UNITS EX SOLR
CUTANEOUS | Status: AC
  Filled 2020-08-01: qty 20000

## 2020-08-01 MED ORDER — 0.9 % SODIUM CHLORIDE (POUR BTL) OPTIME
TOPICAL | Status: DC | PRN
  Administered 2020-08-01: 1000 mL

## 2020-08-01 MED ORDER — ARTIFICIAL TEARS OPHTHALMIC OINT
TOPICAL_OINTMENT | OPHTHALMIC | Status: AC
  Filled 2020-08-01: qty 3.5

## 2020-08-01 MED ORDER — ACETAMINOPHEN 325 MG PO TABS
650.0000 mg | ORAL_TABLET | ORAL | Status: DC | PRN
  Administered 2020-08-01: 650 mg via ORAL
  Filled 2020-08-01: qty 2

## 2020-08-01 MED ORDER — ACETAMINOPHEN 650 MG RE SUPP: 650.0000 mg | RECTAL | Status: DC | PRN

## 2020-08-01 MED ORDER — CHLORPHENIRAMINE-PSE-IBUPROFEN 2-30-200 MG PO TABS: 1.0000 | ORAL_TABLET | Freq: Every day | ORAL | Status: DC | PRN

## 2020-08-01 MED ORDER — BUPIVACAINE LIPOSOME 1.3 % IJ SUSP
INTRAMUSCULAR | Status: DC | PRN
  Administered 2020-08-01: 20 mL

## 2020-08-01 MED ORDER — HYDROMORPHONE HCL 2 MG PO TABS
4.0000 mg | ORAL_TABLET | ORAL | Status: DC | PRN
Start: 2020-08-01 — End: 2020-08-02

## 2020-08-01 MED ORDER — SUGAMMADEX SODIUM 500 MG/5ML IV SOLN
INTRAVENOUS | Status: DC | PRN
  Administered 2020-08-01: 200 mg via INTRAVENOUS

## 2020-08-01 MED ORDER — AMLODIPINE BESYLATE 5 MG PO TABS: 5.0000 mg | ORAL_TABLET | Freq: Every day | ORAL | Status: DC

## 2020-08-01 MED ORDER — ACETAMINOPHEN 10 MG/ML IV SOLN: 1000.0000 mg | Freq: Once | INTRAVENOUS | Status: DC | PRN

## 2020-08-01 MED ORDER — ACETAMINOPHEN 10 MG/ML IV SOLN
INTRAVENOUS | Status: DC | PRN
  Administered 2020-08-01: 1000 mg via INTRAVENOUS

## 2020-08-01 MED ORDER — MIDAZOLAM HCL 2 MG/2ML IJ SOLN
INTRAMUSCULAR | Status: AC
  Filled 2020-08-01: qty 2

## 2020-08-01 MED ORDER — LISINOPRIL 20 MG PO TABS
40.0000 mg | ORAL_TABLET | Freq: Every day | ORAL | Status: DC
  Administered 2020-08-01: 40 mg via ORAL
  Filled 2020-08-01: qty 2

## 2020-08-01 MED ORDER — LIDOCAINE 2% (20 MG/ML) 5 ML SYRINGE
INTRAMUSCULAR | Status: AC
  Filled 2020-08-01: qty 5

## 2020-08-01 MED ORDER — SODIUM CHLORIDE 0.9% FLUSH
3.0000 mL | Freq: Two times a day (BID) | INTRAVENOUS | Status: DC
  Administered 2020-08-01: 3 mL via INTRAVENOUS

## 2020-08-01 MED ORDER — ONDANSETRON HCL 4 MG PO TABS: 4.0000 mg | ORAL_TABLET | Freq: Four times a day (QID) | ORAL | Status: DC | PRN

## 2020-08-01 MED ORDER — MENTHOL 3 MG MT LOZG: 1.0000 | LOZENGE | OROMUCOSAL | Status: DC | PRN

## 2020-08-01 MED ORDER — LIDOCAINE-EPINEPHRINE 1 %-1:100000 IJ SOLN
INTRAMUSCULAR | Status: DC | PRN
  Administered 2020-08-01: 10 mL

## 2020-08-01 MED ORDER — HAIR/SKIN/NAILS PO CAPS: 1.0000 | ORAL_CAPSULE | Freq: Every day | ORAL | Status: DC

## 2020-08-01 MED ORDER — OXYCODONE HCL 5 MG/5ML PO SOLN: 5.0000 mg | Freq: Once | ORAL | Status: AC | PRN

## 2020-08-01 MED ORDER — ACETAMINOPHEN 500 MG PO TABS: 1000.0000 mg | ORAL_TABLET | Freq: Once | ORAL | Status: DC | PRN

## 2020-08-01 MED ORDER — CYCLOBENZAPRINE HCL 10 MG PO TABS
ORAL_TABLET | ORAL | Status: AC
  Filled 2020-08-01: qty 1

## 2020-08-01 MED ORDER — NORTRIPTYLINE HCL 25 MG PO CAPS
25.0000 mg | ORAL_CAPSULE | Freq: Every day | ORAL | Status: DC
  Administered 2020-08-01: 25 mg via ORAL
  Filled 2020-08-01: qty 1

## 2020-08-01 MED ORDER — LEFLUNOMIDE 20 MG PO TABS
20.0000 mg | ORAL_TABLET | Freq: Every day | ORAL | Status: DC
  Filled 2020-08-01 (×2): qty 1

## 2020-08-01 MED ORDER — ADULT MULTIVITAMIN W/MINERALS CH
1.0000 | ORAL_TABLET | Freq: Every day | ORAL | Status: DC
  Filled 2020-08-01: qty 1

## 2020-08-01 MED ORDER — LORATADINE 10 MG PO TABS: 10.0000 mg | ORAL_TABLET | Freq: Every day | ORAL | Status: DC

## 2020-08-01 MED ORDER — CAPSAICIN-MENTHOL 0.025-1.25 % EX PTCH: 1.0000 | MEDICATED_PATCH | Freq: Every day | CUTANEOUS | Status: DC | PRN

## 2020-08-01 MED ORDER — DEXAMETHASONE SODIUM PHOSPHATE 10 MG/ML IJ SOLN
10.0000 mg | Freq: Once | INTRAMUSCULAR | Status: AC
  Administered 2020-08-01: 10 mg via INTRAVENOUS
  Filled 2020-08-01: qty 1

## 2020-08-01 MED ORDER — ONDANSETRON HCL 4 MG/2ML IJ SOLN
INTRAMUSCULAR | Status: AC
  Filled 2020-08-01: qty 2

## 2020-08-01 MED ORDER — PROPOFOL 10 MG/ML IV BOLUS
INTRAVENOUS | Status: DC | PRN
  Administered 2020-08-01: 110 mg via INTRAVENOUS

## 2020-08-01 MED ORDER — BUPIVACAINE LIPOSOME 1.3 % IJ SUSP
INTRAMUSCULAR | Status: AC
  Filled 2020-08-01: qty 20

## 2020-08-01 MED ORDER — GABAPENTIN 300 MG PO CAPS
600.0000 mg | ORAL_CAPSULE | Freq: Two times a day (BID) | ORAL | Status: DC
  Administered 2020-08-01 (×2): 600 mg via ORAL
  Filled 2020-08-01 (×2): qty 2

## 2020-08-01 MED ORDER — ALPRAZOLAM 0.5 MG PO TABS
0.5000 mg | ORAL_TABLET | Freq: Every evening | ORAL | Status: DC | PRN
  Administered 2020-08-01: 1 mg via ORAL
  Filled 2020-08-01: qty 2

## 2020-08-01 MED ORDER — CHLORHEXIDINE GLUCONATE CLOTH 2 % EX PADS: 6.0000 | MEDICATED_PAD | Freq: Once | CUTANEOUS | Status: DC

## 2020-08-01 MED ORDER — ACETAMINOPHEN 160 MG/5ML PO SOLN: 1000.0000 mg | Freq: Once | ORAL | Status: DC | PRN

## 2020-08-01 MED ORDER — CEFAZOLIN SODIUM-DEXTROSE 2-4 GM/100ML-% IV SOLN
2.0000 g | INTRAVENOUS | Status: AC
  Administered 2020-08-01: 2 g via INTRAVENOUS
  Filled 2020-08-01: qty 100

## 2020-08-01 MED ORDER — HYDROXYCHLOROQUINE SULFATE 200 MG PO TABS
200.0000 mg | ORAL_TABLET | Freq: Every day | ORAL | Status: DC
  Filled 2020-08-01: qty 1

## 2020-08-01 MED ORDER — VITAMIN B-12 1000 MCG PO TABS: 1000.0000 ug | ORAL_TABLET | ORAL | Status: DC

## 2020-08-01 MED ORDER — FENTANYL CITRATE (PF) 100 MCG/2ML IJ SOLN
INTRAMUSCULAR | Status: DC | PRN
  Administered 2020-08-01: 50 ug via INTRAVENOUS
  Administered 2020-08-01 (×2): 100 ug via INTRAVENOUS

## 2020-08-01 MED ORDER — OXYCODONE HCL 5 MG PO TABS: 5.0000 mg | ORAL_TABLET | Freq: Once | ORAL | Status: AC | PRN

## 2020-08-01 MED ORDER — SODIUM CHLORIDE 0.9 % IV SOLN: 250.0000 mL | INTRAVENOUS | Status: DC

## 2020-08-01 MED ORDER — SODIUM CHLORIDE 0.9% FLUSH: 3.0000 mL | INTRAVENOUS | Status: DC | PRN

## 2020-08-01 MED ORDER — FENTANYL CITRATE (PF) 250 MCG/5ML IJ SOLN
INTRAMUSCULAR | Status: AC
  Filled 2020-08-01: qty 5

## 2020-08-01 MED ORDER — ONDANSETRON HCL 4 MG/2ML IJ SOLN: 4.0000 mg | Freq: Four times a day (QID) | INTRAMUSCULAR | Status: DC | PRN

## 2020-08-01 MED ORDER — SUCRALFATE 1 G PO TABS
1.0000 g | ORAL_TABLET | Freq: Three times a day (TID) | ORAL | Status: DC | PRN
  Filled 2020-08-01: qty 1

## 2020-08-01 MED ORDER — PANTOPRAZOLE SODIUM 40 MG IV SOLR: 40.0000 mg | Freq: Every day | INTRAVENOUS | Status: DC

## 2020-08-01 MED ORDER — FENTANYL CITRATE (PF) 100 MCG/2ML IJ SOLN
25.0000 ug | INTRAMUSCULAR | Status: DC | PRN
  Administered 2020-08-01: 25 ug via INTRAVENOUS

## 2020-08-01 MED ORDER — OXYCODONE HCL 5 MG PO TABS
ORAL_TABLET | ORAL | Status: AC
  Administered 2020-08-01: 5 mg via ORAL
  Filled 2020-08-01: qty 1

## 2020-08-01 MED ORDER — CYCLOBENZAPRINE HCL 10 MG PO TABS
10.0000 mg | ORAL_TABLET | Freq: Three times a day (TID) | ORAL | Status: DC | PRN
  Administered 2020-08-01 – 2020-08-02 (×3): 10 mg via ORAL
  Filled 2020-08-01 (×2): qty 1

## 2020-08-01 MED ORDER — SALINE SPRAY 0.65 % NA SOLN: 1.0000 | NASAL | Status: DC | PRN

## 2020-08-01 MED ORDER — THROMBIN 20000 UNITS EX SOLR
CUTANEOUS | Status: DC | PRN
  Administered 2020-08-01: 20 mL via TOPICAL

## 2020-08-01 MED ORDER — ATORVASTATIN CALCIUM 10 MG PO TABS: 20.0000 mg | ORAL_TABLET | Freq: Every day | ORAL | Status: DC

## 2020-08-01 MED ORDER — LIDOCAINE-EPINEPHRINE 1 %-1:100000 IJ SOLN
INTRAMUSCULAR | Status: AC
  Filled 2020-08-01: qty 1

## 2020-08-01 MED ORDER — EPHEDRINE SULFATE 50 MG/ML IJ SOLN
INTRAMUSCULAR | Status: DC | PRN
  Administered 2020-08-01: 10 mg via INTRAVENOUS

## 2020-08-01 MED ORDER — ONDANSETRON HCL 4 MG/2ML IJ SOLN
INTRAMUSCULAR | Status: DC | PRN
  Administered 2020-08-01: 4 mg via INTRAVENOUS

## 2020-08-01 MED ORDER — EPHEDRINE 5 MG/ML INJ
INTRAVENOUS | Status: AC
  Filled 2020-08-01: qty 10

## 2020-08-01 MED ORDER — MIDAZOLAM HCL 5 MG/5ML IJ SOLN
INTRAMUSCULAR | Status: DC | PRN
  Administered 2020-08-01: 2 mg via INTRAVENOUS

## 2020-08-01 MED ORDER — PHENOL 1.4 % MT LIQD: 1.0000 | OROMUCOSAL | Status: DC | PRN

## 2020-08-01 MED ORDER — CEFAZOLIN SODIUM-DEXTROSE 2-4 GM/100ML-% IV SOLN
2.0000 g | Freq: Three times a day (TID) | INTRAVENOUS | Status: AC
  Administered 2020-08-01 – 2020-08-02 (×2): 2 g via INTRAVENOUS
  Filled 2020-08-01 (×2): qty 100

## 2020-08-01 MED ORDER — CHLORHEXIDINE GLUCONATE 0.12 % MT SOLN
15.0000 mL | Freq: Once | OROMUCOSAL | Status: AC
  Administered 2020-08-01: 15 mL via OROMUCOSAL
  Filled 2020-08-01: qty 15

## 2020-08-01 MED ORDER — ACETAMINOPHEN 10 MG/ML IV SOLN
INTRAVENOUS | Status: AC
  Filled 2020-08-01: qty 100

## 2020-08-01 MED ORDER — PANTOPRAZOLE SODIUM 40 MG PO TBEC: 40.0000 mg | DELAYED_RELEASE_TABLET | Freq: Every day | ORAL | Status: DC

## 2020-08-01 MED ORDER — BUPIVACAINE HCL (PF) 0.25 % IJ SOLN
INTRAMUSCULAR | Status: AC
  Filled 2020-08-01: qty 30

## 2020-08-01 MED ORDER — HYDROCODONE-ACETAMINOPHEN 7.5-325 MG PO TABS
0.5000 | ORAL_TABLET | Freq: Four times a day (QID) | ORAL | Status: DC | PRN
  Administered 2020-08-01 – 2020-08-02 (×3): 0.5 via ORAL
  Filled 2020-08-01 (×3): qty 1

## 2020-08-01 MED ORDER — PHENYLEPHRINE HCL-NACL 10-0.9 MG/250ML-% IV SOLN
INTRAVENOUS | Status: DC | PRN
  Administered 2020-08-01: 25 ug/min via INTRAVENOUS

## 2020-08-01 MED ORDER — LACTATED RINGERS IV SOLN: INTRAVENOUS | Status: DC

## 2020-08-01 MED ORDER — PROPOFOL 10 MG/ML IV BOLUS
INTRAVENOUS | Status: AC
  Filled 2020-08-01: qty 40

## 2020-08-01 MED ORDER — ALUM & MAG HYDROXIDE-SIMETH 200-200-20 MG/5ML PO SUSP: 30.0000 mL | Freq: Four times a day (QID) | ORAL | Status: DC | PRN

## 2020-08-01 MED ORDER — APPLE CIDER VINEGAR 500 MG PO TABS: ORAL_TABLET | Freq: Every day | ORAL | Status: DC

## 2020-08-01 MED ORDER — ROCURONIUM BROMIDE 10 MG/ML (PF) SYRINGE
PREFILLED_SYRINGE | INTRAVENOUS | Status: AC
  Filled 2020-08-01: qty 10

## 2020-08-01 MED ORDER — HYDROMORPHONE HCL 1 MG/ML IJ SOLN
0.5000 mg | INTRAMUSCULAR | Status: DC | PRN
  Administered 2020-08-01: 0.5 mg via INTRAVENOUS
  Filled 2020-08-01: qty 0.5

## 2020-08-01 MED ORDER — FENTANYL CITRATE (PF) 100 MCG/2ML IJ SOLN
INTRAMUSCULAR | Status: AC
  Administered 2020-08-01: 25 ug via INTRAVENOUS
  Filled 2020-08-01: qty 2

## 2020-08-01 MED ORDER — ORAL CARE MOUTH RINSE: 15.0000 mL | Freq: Once | OROMUCOSAL | Status: AC

## 2020-08-01 SURGICAL SUPPLY — 72 items
ADH SKN CLS APL DERMABOND .7 (GAUZE/BANDAGES/DRESSINGS) ×1
APL SKNCLS STERI-STRIP NONHPOA (GAUZE/BANDAGES/DRESSINGS) ×1
BENZOIN TINCTURE PRP APPL 2/3 (GAUZE/BANDAGES/DRESSINGS) ×3 IMPLANT
BLADE CLIPPER SURG (BLADE) IMPLANT
BLADE SURG 11 STRL SS (BLADE) ×1 IMPLANT
BUR MATCHSTICK NEURO 3.0 LAGG (BURR) ×3 IMPLANT
BUR PRECISION FLUTE 6.0 (BURR) ×2 IMPLANT
CANISTER SUCT 3000ML PPV (MISCELLANEOUS) ×2 IMPLANT
CARTRIDGE OIL MAESTRO DRILL (MISCELLANEOUS) ×2 IMPLANT
CLSR STERI-STRIP ANTIMIC 1/2X4 (GAUZE/BANDAGES/DRESSINGS) ×1 IMPLANT
CNTNR URN SCR LID CUP LEK RST (MISCELLANEOUS) ×1 IMPLANT
CONT SPEC 4OZ STRL OR WHT (MISCELLANEOUS) ×2
COVER BACK TABLE 24X17X13 BIG (DRAPES) IMPLANT
COVER BACK TABLE 60X90IN (DRAPES) ×2 IMPLANT
COVER WAND RF STERILE (DRAPES) ×2 IMPLANT
DECANTER SPIKE VIAL GLASS SM (MISCELLANEOUS) ×3 IMPLANT
DERMABOND ADVANCED (GAUZE/BANDAGES/DRESSINGS) ×1
DERMABOND ADVANCED .7 DNX12 (GAUZE/BANDAGES/DRESSINGS) ×2 IMPLANT
DIFFUSER DRILL AIR PNEUMATIC (MISCELLANEOUS) ×3 IMPLANT
DRAPE C-ARM 42X72 X-RAY (DRAPES) ×4 IMPLANT
DRAPE HALF SHEET 40X57 (DRAPES) IMPLANT
DRAPE LAPAROTOMY 100X72X124 (DRAPES) ×4 IMPLANT
DRAPE POUCH INSTRU U-SHP 10X18 (DRAPES) ×2 IMPLANT
DRAPE SURG 17X23 STRL (DRAPES) ×4 IMPLANT
DRSG OPSITE 4X5.5 SM (GAUZE/BANDAGES/DRESSINGS) ×1 IMPLANT
DRSG OPSITE POSTOP 4X6 (GAUZE/BANDAGES/DRESSINGS) ×1 IMPLANT
DRSG OPSITE POSTOP 4X8 (GAUZE/BANDAGES/DRESSINGS) ×1 IMPLANT
DURAPREP 26ML APPLICATOR (WOUND CARE) ×2 IMPLANT
ELECT REM PT RETURN 9FT ADLT (ELECTROSURGICAL) ×2
ELECTRODE REM PT RTRN 9FT ADLT (ELECTROSURGICAL) ×2 IMPLANT
EVACUATOR 1/8 PVC DRAIN (DRAIN) ×1 IMPLANT
EVACUATOR 3/16  PVC DRAIN (DRAIN) ×2
EVACUATOR 3/16 PVC DRAIN (DRAIN) ×1 IMPLANT
GAUZE 4X4 16PLY RFD (DISPOSABLE) IMPLANT
GAUZE SPONGE 4X4 12PLY STRL (GAUZE/BANDAGES/DRESSINGS) ×3 IMPLANT
GLOVE BIO SURGEON STRL SZ7 (GLOVE) IMPLANT
GLOVE BIO SURGEON STRL SZ8 (GLOVE) ×6 IMPLANT
GLOVE EXAM NITRILE XL STR (GLOVE) IMPLANT
GLOVE INDICATOR 8.5 STRL (GLOVE) ×6 IMPLANT
GLOVE SURG UNDER POLY LF SZ6.5 (GLOVE) ×1 IMPLANT
GLOVE SURG UNDER POLY LF SZ7 (GLOVE) IMPLANT
GOWN STRL REUS W/ TWL LRG LVL3 (GOWN DISPOSABLE) ×1 IMPLANT
GOWN STRL REUS W/ TWL XL LVL3 (GOWN DISPOSABLE) ×4 IMPLANT
GOWN STRL REUS W/TWL 2XL LVL3 (GOWN DISPOSABLE) IMPLANT
GOWN STRL REUS W/TWL LRG LVL3 (GOWN DISPOSABLE) ×2
GOWN STRL REUS W/TWL XL LVL3 (GOWN DISPOSABLE) ×8
GRAFT BN 10X1XDBM MAGNIFUSE (Bone Implant) IMPLANT
GRAFT BONE MAGNIFUSE 1X10CM (Bone Implant) ×2 IMPLANT
KIT BASIN OR (CUSTOM PROCEDURE TRAY) ×3 IMPLANT
KIT INFUSE SMALL (Orthopedic Implant) ×1 IMPLANT
KIT TURNOVER KIT B (KITS) ×4 IMPLANT
NDL HYPO 25X1 1.5 SAFETY (NEEDLE) ×1 IMPLANT
NDL SPNL 22GX3.5 QUINCKE BK (NEEDLE) ×1 IMPLANT
NEEDLE HYPO 22GX1.5 SAFETY (NEEDLE) ×2 IMPLANT
NEEDLE HYPO 25X1 1.5 SAFETY (NEEDLE) ×2 IMPLANT
NEEDLE SPNL 22GX3.5 QUINCKE BK (NEEDLE) ×2 IMPLANT
NS IRRIG 1000ML POUR BTL (IV SOLUTION) ×3 IMPLANT
OIL CARTRIDGE MAESTRO DRILL (MISCELLANEOUS) ×2
PACK LAMINECTOMY NEURO (CUSTOM PROCEDURE TRAY) ×3 IMPLANT
PAD ARMBOARD 7.5X6 YLW CONV (MISCELLANEOUS) ×6 IMPLANT
SPONGE LAP 4X18 RFD (DISPOSABLE) IMPLANT
SPONGE SURGIFOAM ABS GEL 100 (HEMOSTASIS) ×2 IMPLANT
SPONGE SURGIFOAM ABS GEL SZ50 (HEMOSTASIS) ×1 IMPLANT
STRIP CLOSURE SKIN 1/2X4 (GAUZE/BANDAGES/DRESSINGS) ×4 IMPLANT
SUT VIC AB 0 CT1 18XCR BRD8 (SUTURE) ×3 IMPLANT
SUT VIC AB 0 CT1 8-18 (SUTURE) ×4
SUT VIC AB 2-0 CT1 18 (SUTURE) ×4 IMPLANT
SUT VICRYL 4-0 PS2 18IN ABS (SUTURE) ×3 IMPLANT
TOWEL GREEN STERILE (TOWEL DISPOSABLE) ×3 IMPLANT
TOWEL GREEN STERILE FF (TOWEL DISPOSABLE) ×3 IMPLANT
TRAY FOLEY MTR SLVR 16FR STAT (SET/KITS/TRAYS/PACK) ×2 IMPLANT
WATER STERILE IRR 1000ML POUR (IV SOLUTION) ×3 IMPLANT

## 2020-08-01 NOTE — Transfer of Care (Signed)
Immediate Anesthesia Transfer of Care Note  Patient: Angie Olson  Procedure(s) Performed: Removal of hardware with cutting the rod below the Lumbar one screws and removal of bilateral Lumbar four screws (N/A Spine Lumbar) posterolateral and Situ fusion (N/A Spine Lumbar)  Patient Location: PACU  Anesthesia Type:General  Level of Consciousness: awake, alert , oriented and patient cooperative  Airway & Oxygen Therapy: Patient Spontanous Breathing and Patient connected to face mask oxygen  Post-op Assessment: Report given to RN, Post -op Vital signs reviewed and stable and Patient moving all extremities X 4  Post vital signs: Reviewed and stable  Last Vitals:  Vitals Value Taken Time  BP    Temp    Pulse 80 08/01/20 1019  Resp 17 08/01/20 1019  SpO2 98 % 08/01/20 1019  Vitals shown include unvalidated device data.  Last Pain:  Vitals:   08/01/20 0634  TempSrc: Oral         Complications: No complications documented.

## 2020-08-01 NOTE — Anesthesia Preprocedure Evaluation (Signed)
Anesthesia Evaluation  Patient identified by MRN, date of birth, ID band Patient awake    Reviewed: Allergy & Precautions, NPO status , Patient's Chart, lab work & pertinent test results  History of Anesthesia Complications (+) PONV and history of anesthetic complications  Airway Mallampati: I  TM Distance: >3 FB Neck ROM: Full    Dental  (+) Teeth Intact, Dental Advisory Given, Missing,    Pulmonary neg shortness of breath, neg sleep apnea, neg COPD, neg recent URI,  Covid-19 Nucleic Acid Test Results Lab Results      Component                Value               Date                      [place]              NEGATIVE            07/30/2020                [place]              NEGATIVE            01/19/2020                [place]              NOT DETECTED        01/27/2019                [place]              NEGATIVE            01/10/2019              breath sounds clear to auscultation       Cardiovascular hypertension, Pt. on medications (-) angina(-) Past MI and (-) CHF (-) dysrhythmias  Rhythm:Regular  Left ventricle: The cavity size was normal. Systolic  function was normal. The estimated ejection fraction was in  the range of 60% to 65%. Wall motion was normal; there were  no regional wall motion abnormalities. Doppler parameters  are consistent with abnormal left ventricular relaxation  (grade 1 diastolic dysfunction).   ------------------------------------------------------------  Aortic valve:  Mildly calcified leaflets. Cusp separation  was normal. Doppler: Transvalvular velocity was within the  normal range. There was no stenosis. No regurgitation.   ------------------------------------------------------------  Aorta: The aorta was normal, not dilated, and non-diseased.    ------------------------------------------------------------  Mitral valve:  Structurally normal valve.  Leaflet   separation was normal. Doppler: Transvalvular velocity was  within the normal range. There was no evidence for stenosis.  No regurgitation.   ------------------------------------------------------------  Left atrium: The atrium was normal in size.   ------------------------------------------------------------  Right ventricle: The cavity size was normal. Wall thickness  was normal. Systolic function was normal.   ------------------------------------------------------------  Pulmonic valve:  Structurally normal valve.  Cusp  separation was normal. Doppler: Transvalvular velocity was  within the normal range. Trivial regurgitation.   ------------------------------------------------------------  Tricuspid valve:  Structurally normal valve.  Leaflet  separation was normal. Doppler: Transvalvular velocity was  within the normal range. No regurgitation.   ------------------------------------------------------------  Pulmonary artery:  The main pulmonary artery was  normal-sized.   ------------------------------------------------------------  Right atrium: The atrium was normal in size.   ------------------------------------------------------------  Pericardium: The pericardium was normal in appearance.  There was no pericardial  effusion.    Notes Recorded by Talbert Cage, RN on 06/30/2013 at 2:38 PM Patient notified of test results and following MD notes: "Please let patient know that stress test showed no ischemia and normal LVF - patient is low risk from a cardiac standpoint for surgery". Patient verbalized understanding and appreciation for information.   Neuro/Psych neg Seizures PSYCHIATRIC DISORDERS Anxiety Lumbar fracture TIA   GI/Hepatic Neg liver ROS, hiatal hernia, GERD  Medicated and Controlled,  Endo/Other  negative endocrine ROS  Renal/GU Lab Results      Component                Value               Date                      CREATININE                0.76                07/30/2020            Lab Results      Component                Value               Date                      K                        4.1                 07/30/2020                 Musculoskeletal  (+) Arthritis ,   Abdominal   Peds  Hematology  (+) Blood dyscrasia, anemia , Lab Results      Component                Value               Date                      WBC                      6.6                 07/30/2020                HGB                      10.4 (L)            07/30/2020                HCT                      33.8 (L)            07/30/2020                MCV                      82.2                07/30/2020  PLT                      332                 07/30/2020              Anesthesia Other Findings   Reproductive/Obstetrics                             Anesthesia Physical Anesthesia Plan  ASA: III  Anesthesia Plan: General   Post-op Pain Management:    Induction: Intravenous  PONV Risk Score and Plan: 4 or greater and Ondansetron and Dexamethasone  Airway Management Planned: Oral ETT  Additional Equipment: None  Intra-op Plan:   Post-operative Plan: Extubation in OR  Informed Consent: I have reviewed the patients History and Physical, chart, labs and discussed the procedure including the risks, benefits and alternatives for the proposed anesthesia with the patient or authorized representative who has indicated his/her understanding and acceptance.     Dental advisory given  Plan Discussed with: CRNA and Surgeon  Anesthesia Plan Comments:         Anesthesia Quick Evaluation

## 2020-08-01 NOTE — Anesthesia Procedure Notes (Signed)
Procedure Name: Intubation Date/Time: 08/01/2020 8:43 AM Performed by: Jonna Munro, CRNA Pre-anesthesia Checklist: Patient identified, Emergency Drugs available, Suction available, Patient being monitored and Timeout performed Patient Re-evaluated:Patient Re-evaluated prior to induction Oxygen Delivery Method: Circle system utilized Preoxygenation: Pre-oxygenation with 100% oxygen Induction Type: IV induction Ventilation: Mask ventilation without difficulty Laryngoscope Size: Mac and 3 Grade View: Grade I Tube type: Oral Tube size: 7.0 mm Number of attempts: 1 Airway Equipment and Method: Stylet Placement Confirmation: ETT inserted through vocal cords under direct vision,  positive ETCO2 and breath sounds checked- equal and bilateral Secured at: 22 cm Tube secured with: Tape Dental Injury: Teeth and Oropharynx as per pre-operative assessment

## 2020-08-01 NOTE — Plan of Care (Signed)
  Problem: Education: Goal: Ability to verbalize activity precautions or restrictions will improve 08/01/2020 1213 by Gwendolyn Fill, RN Outcome: Progressing 08/01/2020 1213 by Gwendolyn Fill, RN Outcome: Progressing   Problem: Bladder/Genitourinary: Goal: Urinary functional status for postoperative course will improve Outcome: Progressing

## 2020-08-01 NOTE — Op Note (Signed)
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  Posterior lateral arthrodesis L3-L5 utilizing BMP and Magnifuse cortical cancellous chips  Surgeon: Dominica Severin Daquawn Seelman  Assistant: Nash Shearer  Anesthesia: General  EBL: Minimal  HPI: 70 year old female progressive worsening back pain work-up revealed possible pseudoarthrosis L3-4 loosening of L4 screws patient was recommended exploration fusion move of hardware and possible redo posterior lateral fusion.  I extensively went over the risks and benefits of the operation with her as well as perioperative course expectations of outcome and alternatives of surgery and she understood and agreed to proceed forward.  Operative procedure: Patient was brought into the OR was [place] general anesthesia positioned prone the Wilson frame her back was prepped and draped in routine sterile fashion preoperative x-ray localized the L4 screws so after infiltration of 10 cc lidocaine with epi midline incision was made and Bovie electrocautery was used to take down the scar and subcutaneous tissue immediately identified the left-sided L4 screw and rod the right-sided L4 screw and rod was partially encased in bone this was dissected away the bone was drilled away and that screw and rod construct was identified.  The knots were then removed the rod was then cut approximately at the level of L3 and then the loose L4 screws were removed the fusion did appear to be partially solid although there was maybe a little micromotion at that level I did elect to proceed forward with a posterior lateral arthrodesis so identified the TPs lateral facet joints from L3 down to L5 exposed him aggressively decorticated them and laid strips of BMP and Magnifuse where the  cortical cancellous chips packaged in a prebag posterior laterally from L3-L5.  Then meticulous hemostasis was maintained I did place a medium Hemovac drain injected the fascia with Exparel rel and closed the wound in layers with opted Vicryl and a running 4 subcuticular Dermabond benzoin Steri-Strips and sterile dressing was applied patient recovery in stable condition.  At the end the case all needle count sponge counts were correct.

## 2020-08-01 NOTE — H&P (Addendum)
Angie Olson is an 70 y.o. female.   Chief Complaint: Back pain HPI: 70 year old female previously undergone thoracolumbar to the sacrum fusion over multiple operations.  Patient most recently presented with increased lower back pain and work-up revealed worsening of her L4 pedicle screws primarily on the right but also evidence of facet loosening on the left fracture of the right L5 pedicle and lamina but partial interbody fusion and partial posterior lateral fusion at that level.  Patient's failed conservative treatment and progression of clinical syndrome necessitated reoperation and options were to cut the rod remove the hardware and potentially augment the fusion with some in situ onlay bone versus curing her down to the pelvis and putting an iliac screw secondary to her previously removed hardware at L4 and L5.  Due to the fact that she does have partial interbody fusion at 3 4 and 4 5 and partial release posterior lateral fusion at those levels as well I think there is a very good chance she should be able to heal this with potentially just augmenting with onlay and in situ fusion and removal of the loose screws by cutting the rod between L2 and L4.  Extensively reviewed all the risks and benefits of this with the patient as well as the potential of failure and additional surgery to take her down to the pelvis and she understands this and agrees to proceed with the current plan so we recommended cutting the rod removal of the L4 screws redo in situ posterior lateral fusion gone over the risks and benefits perioperative course expectations of outcome and alternatives of surgery and she understands and agrees to proceed forward.  Past Medical History:  Diagnosis Date  . Acid reflux   . Allergic rhinitis   . anesthetic complication    Respiratory arrest 2019/02/07  . Anxiety    PT'S SON DIED 2010/07/08, husband died in June 07, 2019, Mother died in 09-07-19  . Arthritis    RA AND OA  . Borderline  hypercholesterolemia    on medication for TIA  . Bronchitis    CHRONIC   . Cancer ([place])    melanoma - face   . Colon polyp   . Environmental allergies   . GERD (gastroesophageal reflux disease)    on PPI and carafte prn  . H/O hiatal hernia   . History of COVID-19 02/07/19  . History of kidney stones   . Hypertension   . IBS (irritable bowel syndrome)   . LBP (low back pain)   . OA (osteoarthritis)    Left Shoulder  . Pain    RIGHT KNEE - TORN MENISCUS AND ACL  . Pneumonia    X 3  - LAST TIME WAS Jul 08, 2011  . PONV (postoperative nausea and vomiting) 09-2012   sore throat after ankle surgery 6 yrs ago and severe ponv 09-2012  . Pre-diabetes    patient denies - states it was "years ago and no follow up"  . Pulmonary nodule seen on imaging study   . Shortness of breath    with activity,bronchitis  . Stroke ([place]) 03/2011   TIA--PT EXPERIENCED NUMBNESS RT HAND AND ARM , HEADACHE AND NAUSEA. NO RESIDUAL PROBLEMS    Past Surgical History:  Procedure Laterality Date  . ANKLE SURGERY  ~10 years ago   left  . BACK SURGERY  July 07, 2009   lower  . CARPAL TUNNEL RELEASE Right 08-Jul-2019  . CHOLECYSTECTOMY  ~15 years ago  . COLONOSCOPY  GC:[id number], 07-08-11  .  HARDWARE REMOVAL N/A 04/07/2016   Procedure: HARDWARE REMOVAL;  Surgeon: Kary Kos, MD;  Location: [facility];  Service: Neurosurgery;  Laterality: N/A;  . HARVEST BONE GRAFT N/A 07/05/2014   Procedure: HARVEST ILIAC BONE GRAFT;  Surgeon: Kary Kos, MD;  Location: [facility];  Service: Neurosurgery;  Laterality: N/A;  . KNEE ARTHROSCOPY Right 10/13/2012   Procedure: RIGHT KNEE ARTHROSCOPY WITH DEBRIDEMENT, CHONDROPLASTY;  Surgeon: Gearlean Alf, MD;  Location: [facility];  Service: Orthopedics;  Laterality: Right;  . KNEE ARTHROSCOPY Right 03/02/2013   Procedure: RIGHT ARTHROSCOPY KNEE WITH MEDIAL MENISCAL  DEBRIDEMENT;  Surgeon: Gearlean Alf, MD;  Location: [facility];  Service: Orthopedics;  Laterality: Right;  . LAMINECTOMY WITH POSTERIOR LATERAL  ARTHRODESIS LEVEL 4 N/A 01/23/2020   Procedure: Posterior lateral fusion - Thoracic Ten -Thoracic Eleven - Thoracic Eleven -Thoracic Twelve - Thoracic Twelve- Lumbar One - Lumbar One- Lumbar Two revision with removal globus;  Surgeon: Kary Kos, MD;  Location: [facility];  Service: Neurosurgery;  Laterality: N/A;  Eleven - Thoracic Eleven -Thoracic Twelve - Thoracic Twelve- Lumbar One - Lumbar One- Lumbar Two revision with removal globus  . LUMBAR FUSION  07/05/2014  . LUMBAR WOUND DEBRIDEMENT N/A 01/11/2014   Procedure: Irrigation and Debridement Lumbar Wound for hematoma;  Surgeon: Elaina Hoops, MD;  Location: [facility];  Service: Neurosurgery;  Laterality: N/A;  . MAXIMUM ACCESS (MAS)POSTERIOR LUMBAR INTERBODY FUSION (PLIF) 1 LEVEL N/A 12/05/2013   Procedure: FOR MAXIMUM ACCESS (MAS) POSTERIOR LUMBAR INTERBODY FUSION (PLIF)LUMBAR FIVE-SACRAL-ONE,REMOVAL HARDWARE LUMBAR FOUR-FIVE;  Surgeon: Elaina Hoops, MD;  Location: [facility];  Service: Neurosurgery;  Laterality: N/A;  . Mohr's procedure  02/2015   face  . Right Hip arthroplasty     10/01/16 Dr. Wynelle Link  . ROOT CANAL    . SHOULDER SURGERY Left ~14 years ago   "scope"  . thumb surgery Left ~14 years ago   x2  . TOTAL HIP ARTHROPLASTY Left 04/25/2015   Procedure: LEFT TOTAL HIP ARTHROPLASTY ANTERIOR APPROACH;  Surgeon: Gaynelle Arabian, MD;  Location: [facility];  Service: Orthopedics;  Laterality: Left;  . TOTAL HIP ARTHROPLASTY Right 10/01/2016   Procedure: RIGHT TOTAL HIP ARTHROPLASTY ANTERIOR APPROACH;  Surgeon: Gaynelle Arabian, MD;  Location: [facility];  Service: Orthopedics;  Laterality: Right;  . TOTAL KNEE ARTHROPLASTY Right 07/25/2013   Procedure: RIGHT TOTAL KNEE ARTHROPLASTY;  Surgeon: Gearlean Alf, MD;  Location: [facility];  Service: Orthopedics;  Laterality: Right;  . TOTAL KNEE ARTHROPLASTY WITH REVISION COMPONENTS Right 05/28/2016   Procedure: RIGHT TIBIAL POLYETHYLENE KNEE REVISION;  Surgeon: Gaynelle Arabian, MD;  Location: [facility];  Service:  Orthopedics;  Laterality: Right;  requests 43mins  . TRIGGER FINGER RELEASE  ~6 years ago   X2 ON RIGHT HAND  . TUBAL LIGATION      Family History  Problem Relation Age of Onset  . Heart disease Sister   . Lung cancer Brother   . Heart disease Brother   . CAD Brother   . Heart attack Mother   . Emphysema Father   . Hypertension Daughter   . Colon cancer Neg Hx    Social History:  reports that she has never smoked. She has never used smokeless tobacco. She reports that she does not drink alcohol and does not use drugs.  Allergies:  Allergies  Allergen Reactions  . Methotrexate Derivatives Other (See Comments)    Causes recurrent infections  . Pseudoephedrine Other (See Comments)    Makes jittery   .  Reglan [Metoclopramide] Other (See Comments)    insomnia  . Vicodin [Hydrocodone-Acetaminophen] Other (See Comments)    "Makes me feel weird"  . Other Other (See Comments)    Pollen / Closes Sinus  . Oxycodone Itching and Rash    Patient tolerated hydromorphone without reaction.    Medications Prior to Admission  Medication Sig Dispense Refill  . ALPRAZolam (XANAX) 1 MG tablet Take 0.5-1 mg by mouth at bedtime as needed for anxiety.    Marland Kitchen amLODipine (NORVASC) 5 MG tablet Take 5 mg by mouth daily.     . APPLE CIDER VINEGAR PO Take 1 tablet by mouth daily. Goli    . atorvastatin (LIPITOR) 20 MG tablet Take 20 mg by mouth daily.     . Capsaicin-Menthol (SALONPAS GEL-PATCH HOT) 0.025-1.25 % PTCH Apply 1 patch topically daily as needed (pain).    . cetirizine (ZYRTEC) 10 MG tablet Take 10 mg by mouth daily.    . Chlorpheniramine-PSE-Ibuprofen (ADVIL ALLERGY SINUS) 2-30-200 MG TABS Take 1 tablet by mouth daily as needed (Sinus headache).    . esomeprazole (NEXIUM) 20 MG capsule Take 20 mg by mouth daily before breakfast.     . gabapentin (NEURONTIN) 300 MG capsule Take 600 mg by mouth 2 (two) times daily. May take additional dose during the day if needed    .  HYDROcodone-acetaminophen (NORCO) 7.5-325 MG tablet Take 0.5 tablets by mouth every 6 (six) hours as needed for severe pain.    . hydroxychloroquine (PLAQUENIL) 200 MG tablet Take 200 mg by mouth daily.     Marland Kitchen leflunomide (ARAVA) 20 MG tablet Take 20 mg by mouth daily.    Marland Kitchen lisinopril (PRINIVIL,ZESTRIL) 40 MG tablet Take 40 mg by mouth daily.    . Multiple Vitamins-Minerals (HAIR/SKIN/NAILS) CAPS Take 1 tablet by mouth daily.    . Multiple Vitamins-Minerals (MULTIVITAMIN WITH MINERALS) tablet Take 1 tablet by mouth daily. 50 plus    . nortriptyline (PAMELOR) 25 MG capsule Take 25 mg by mouth at bedtime.   2  . predniSONE (DELTASONE) 5 MG tablet Take 5 mg by mouth daily. Continuous for rheumatoid arthritis    . sodium chloride (OCEAN) 0.65 % SOLN nasal spray Place 1 spray into both nostrils as needed for congestion.    . sucralfate (CARAFATE) 1 G tablet Take 1 g by mouth 3 (three) times daily as needed (indigestion).     . vitamin B-12 (CYANOCOBALAMIN) 1000 MCG tablet Take 1,000 mcg by mouth 3 (three) times a week.      Results for orders placed or performed during the hospital encounter of 08/01/20 (from the past 48 hour(s))  Glucose, capillary     Status: Abnormal   Collection Time: 08/01/20  6:32 AM  Result Value Ref Range   Glucose-Capillary 103 (H) 70 - 99 mg/dL    Comment: Glucose reference range applies only to samples taken after fasting for at least 8 hours.   No results found.  Review of Systems  Musculoskeletal: Positive for back pain.    Blood pressure (!) 174/82, pulse 81, temperature 97.9 F (36.6 C), temperature source Oral, resp. rate 18, height 5' 5.5" (1.664 m), weight 83.9 kg, SpO2 96 %. Physical Exam HENT:     Head: Normocephalic.     Right Ear: Tympanic membrane normal.     Mouth/Throat:     Mouth: Mucous membranes are moist.  Eyes:     Pupils: Pupils are equal, round, and reactive to light.  Cardiovascular:  Rate and Rhythm: Normal rate.  Pulmonary:      Effort: Pulmonary effort is normal.  Abdominal:     General: Abdomen is flat.  Musculoskeletal:        General: Normal range of motion.     Cervical back: Normal range of motion.  Skin:    General: Skin is warm.  Neurological:     Mental Status: She is alert.     Comments: Strength 5 out of 5 iliopsoas, quads, hamstrings, gastrocs, into tibialis, and EHL.      Assessment/Plan 70 year old presents for removal of hardware redo in situ from posterior lateral fusion L3-4 L4-5  Elaina Hoops, MD 08/01/2020, 7:55 AM

## 2020-08-02 NOTE — Discharge Summary (Signed)
Physician Discharge Summary  Patient ID: Angie Olson MRN: [id number] DOB/AGE: 06/13/1950 70 y.o. Estimated body mass index is 30.31 kg/m as calculated from the following:   Height as of this encounter: 5' 5.5" (1.664 m).   Weight as of this encounter: 83.9 kg.   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  Consults: Significant Diagnostic Studies: Treatments: Revision of fusion removal of hardware redo posterior lateral fusion L3-4 Discharge Exam: Blood pressure (!) 154/83, pulse 75, temperature 98.5 F (36.9 C), temperature source Oral, resp. rate 18, height 5' 5.5" (1.664 m), weight 83.9 kg, SpO2 96 %. Strength 5-5 on clean dry and intact  Disposition: Home   Allergies as of 08/02/2020      Reactions   Methotrexate Derivatives Other (See Comments)   Causes recurrent infections   Pseudoephedrine Other (See Comments)   Makes jittery    Reglan [metoclopramide] Other (See Comments)   insomnia   Vicodin [hydrocodone-acetaminophen] Other (See Comments)   "Makes me feel weird"   Other Other (See Comments)   Pollen / Closes Sinus   Oxycodone Itching, Rash   Patient tolerated hydromorphone without reaction.      Medication List    TAKE these medications   Advil Allergy Sinus 2-30-200 MG Tabs Generic drug: Chlorpheniramine-PSE-Ibuprofen Take 1 tablet by mouth daily as needed (Sinus headache).   ALPRAZolam 1 MG tablet Commonly known as: XANAX Take 0.5-1 mg by mouth at bedtime as needed for anxiety.   amLODipine 5 MG  tablet Commonly known as: NORVASC Take 5 mg by mouth daily.   APPLE CIDER VINEGAR PO Take 1 tablet by mouth daily. Goli   atorvastatin 20 MG tablet Commonly known as: LIPITOR Take 20 mg by mouth daily.   cetirizine 10 MG tablet Commonly known as: ZYRTEC Take 10 mg by mouth daily.   esomeprazole 20 MG capsule Commonly known as: NEXIUM Take 20 mg by mouth daily before breakfast.   gabapentin 300 MG capsule Commonly known as: NEURONTIN Take 600 mg by mouth 2 (two) times daily. May take additional dose during the day if needed   HYDROcodone-acetaminophen 7.5-325 MG tablet Commonly known as: NORCO Take 0.5 tablets by mouth every 6 (six) hours as needed for severe pain.   hydroxychloroquine 200 MG tablet Commonly known as: PLAQUENIL Take 200 mg by mouth daily.   leflunomide 20 MG tablet Commonly known as: ARAVA Take 20 mg by mouth daily.   lisinopril 40 MG tablet Commonly known as: ZESTRIL Take 40 mg by mouth daily.   multivitamin with minerals tablet Take 1 tablet by mouth daily. 50 plus   Hair/Skin/Nails Caps Take 1 tablet by mouth daily.   nortriptyline 25 MG capsule Commonly known as: PAMELOR Take 25 mg by mouth at bedtime.   predniSONE 5 MG tablet Commonly known as: DELTASONE Take 5 mg by mouth daily. Continuous for rheumatoid arthritis   Salonpas Gel-Patch Hot 0.025-1.25 % Ptch Generic drug: Capsaicin-Menthol Apply 1 patch topically daily as needed (pain).   sodium chloride 0.65 % Soln nasal spray Commonly known as: OCEAN Place 1 spray into both nostrils as needed for congestion.  sucralfate 1 g tablet Commonly known as: CARAFATE Take 1 g by mouth 3 (three) times daily as needed (indigestion).   vitamin B-12 1000 MCG tablet Commonly known as: CYANOCOBALAMIN Take 1,000 mcg by mouth 3 (three) times a week.        Signed: Elaina Hoops 08/02/2020, 7:52 AM

## 2020-08-02 NOTE — Anesthesia Postprocedure Evaluation (Signed)
Anesthesia Post Note  Patient: JALYNN WADDELL  Procedure(s) Performed: Removal of hardware with cutting the rod below the Lumbar one screws and removal of bilateral Lumbar four screws (N/A Spine Lumbar) posterolateral and Situ fusion (N/A Spine Lumbar)     Patient location during evaluation: PACU Anesthesia Type: General Level of consciousness: patient cooperative and awake Pain management: pain level controlled Vital Signs Assessment: post-procedure vital signs reviewed and stable Respiratory status: spontaneous breathing, nonlabored ventilation, respiratory function stable and patient connected to nasal cannula oxygen Cardiovascular status: blood pressure returned to baseline and stable Postop Assessment: no apparent nausea or vomiting Anesthetic complications: no   No complications documented.  Last Vitals:  Vitals:   08/02/20 0541 08/02/20 0752  BP: (!) 154/83 (!) 148/82  Pulse: 75 78  Resp: 18 16  Temp: 36.9 C 36.6 C  SpO2: 96% 97%    Last Pain:  Vitals:   08/02/20 0752  TempSrc: Oral  PainSc:                  Markesha Hannig

## 2020-08-02 NOTE — Discharge Instructions (Signed)

## 2020-08-02 NOTE — Progress Notes (Signed)
Patient is discharged from room 3C08 at this time. Alert and in stable condition. IV site d/c'd and instructions read to patient with understanding verbalized and all questions answered. Left unit via wheelchair with all belongings at side. 

## 2020-08-02 NOTE — Evaluation (Signed)
Occupational Therapy Evaluation Patient Details Name: Angie Olson MRN: [id number] DOB: 1950/04/04 Today's Date: 08/02/2020    History of Present Illness 70 yo admitted 5/18 for hardware removal and redo fusion L3-4 with prior fixation t10-L4. PMhx: anxiety, arthritis, melanoma, HTN, IBS, CVA, bil THA   Clinical Impression   Patient evaluated by Occupational Therapy with no further acute OT needs identified. All education has been completed and the patient has no further questions. Pt able to perform ADLs mod I.  She does require intermittent cues to adhere fully to back precautions.  See below for any follow-up Occupational Therapy or equipment needs. OT is signing off. Thank you for this referral.      Follow Up Recommendations  No OT follow up    Equipment Recommendations  None recommended by OT    Recommendations for Other Services       Precautions / Restrictions Precautions Precautions: Back Precaution Booklet Issued: Yes (comment) Precaution Comments: Pt able to state 3/3 precautions, but requires min cues to adhere to them during functional activities      Mobility Bed Mobility Overal bed mobility: Modified Independent Bed Mobility: Rolling;Sidelying to Sit;Sit to Sidelying Rolling: Supervision Sidelying to sit: Supervision     Sit to sidelying: Modified independent (Device/Increase time) General bed mobility comments: cues to maintain precautions and prevent twisting    Transfers Overall transfer level: Modified independent                    Balance Overall balance assessment: Mild deficits observed, not formally tested                                         ADL either performed or assessed with clinical judgement   ADL Overall ADL's : Needs assistance/impaired Eating/Feeding: Modified independent                                     General ADL Comments: pt able to perform figure 4 to perform LB ADLs.  She  was able to recall safe technique for grooming.  Reiterated back precautions and safety with ADLs and IADLs     Vision Baseline Vision/History: Wears glasses Wears Glasses: At all times       Perception     Praxis      Pertinent Vitals/Pain Pain Assessment: Faces Pain Score: 3  Pain Location: back/incisional Pain Descriptors / Indicators: Aching Pain Intervention(s): Monitored during session;Limited activity within patient's tolerance     Hand Dominance Right   Extremity/Trunk Assessment Upper Extremity Assessment Upper Extremity Assessment: Overall WFL for tasks assessed   Lower Extremity Assessment Lower Extremity Assessment: Overall WFL for tasks assessed   Cervical / Trunk Assessment Cervical / Trunk Assessment: Other exceptions Cervical / Trunk Exceptions: s/p lumbar fusion   Communication Communication Communication: No difficulties   Cognition Arousal/Alertness: Awake/alert Behavior During Therapy: WFL for tasks assessed/performed Overall Cognitive Status: Within Functional Limits for tasks assessed                                     General Comments  encouraged pt to discuss driving limitations with MD.  she reports she has close friends who will assist her as needed with IADLs  Exercises     Shoulder Instructions      Home Living Family/patient expects to be discharged to:: Private residence Living Arrangements: Alone Available Help at Discharge: Friend(s);Available PRN/intermittently Type of Home: House Home Access: Level entry     Home Layout: One level     Bathroom Shower/Tub: Occupational psychologist: Standard Bathroom Accessibility: Yes How Accessible: Accessible via wheelchair Home Equipment: [place] - 2 wheels;Shower seat;Bedside commode;Adaptive equipment Adaptive Equipment: Reacher Additional Comments: Pt reports she has a condo here in [place] that she will stay at for one week after discharge, then [place]  [place] the [place] of her [place] (she reports she has discussed this with MD)      Prior Functioning/Environment Level of Independence: Independent        Comments: lives in [place] in [place] home but 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[place] that she will stay in short term        OT Problem List: Decreased knowledge of use of DME or AE;Decreased knowledge of precautions;Pain      OT Treatment/Interventions:      OT Goals(Current goals can be found in the care plan section) Acute Rehab OT Goals Patient Stated Goal: to get better and able to do everything again OT Goal Formulation: All assessment and education complete, DC therapy  OT Frequency:     Barriers to D/C:            Co-evaluation              AM-PAC OT "6 Clicks" Daily Activity     Outcome Measure Help from another person eating meals?: None Help from another person taking care of personal grooming?: None Help from another person toileting, which includes using toliet, bedpan, or urinal?: None Help from another person bathing (including washing, rinsing, drying)?: None Help from another person to put on and taking off regular upper body clothing?: None Help from another person to put on and taking off regular lower body clothing?: None 6 Click Score: 24   End of Session Equipment Utilized During Treatment: Rolling walker  Activity Tolerance: Patient tolerated treatment well Patient left: in bed;with call bell/phone within reach (sitting EOB)  OT Visit Diagnosis: Pain Pain - part of body:  (back)                Time: 2353-6144 OT Time Calculation (min): 33 min Charges:  OT General Charges $OT Visit: 1 Visit OT Evaluation $OT Eval Low Complexity: 1 Low OT Treatments $Self Care/Home Management : 8-22 mins  Angie Olson., OTR/L Acute Rehabilitation Services Pager [phone] Office [phone]   Angie Olson M 08/02/2020, 10:27 AM

## 2020-08-02 NOTE — Evaluation (Signed)
Physical Therapy Evaluation Patient Details Name: Angie Olson MRN: [id number] DOB: Jun 25, 1950 Today's Date: 08/02/2020   History of Present Illness  70 yo admitted 5/18 for hardware removal and redo fusion L3-4 with prior fixation t10-L4. PMhx: anxiety, arthritis, melanoma, HTN, IBS, CVA, bil THA  Clinical Impression  PT very pleasant and reports living at home with dog and caring for herself. Pt with assist of friend short term at D/C and has necessary DME for D/C. PT educated for all precautions with handout provided and pt verbalizing awareness but difficulty maintaining no twisting. Pt with decreased activity tolerance and function who will benefit from acute therapy to maximize safety and independence.      Follow Up Recommendations Outpatient PT    Equipment Recommendations  None recommended by PT    Recommendations for Other Services       Precautions / Restrictions Precautions Precautions: Back Precaution Booklet Issued: Yes (comment)      Mobility  Bed Mobility Overal bed mobility: Needs Assistance Bed Mobility: Rolling;Sidelying to Sit;Sit to Sidelying Rolling: Supervision Sidelying to sit: Supervision     Sit to sidelying: Modified independent (Device/Increase time) General bed mobility comments: cues to maintain precautions and prevent twisting    Transfers Overall transfer level: Modified independent                  Ambulation/Gait Ambulation/Gait assistance: Supervision Gait Distance (Feet): 300 Feet Assistive device: Rolling walker (2 wheeled) Gait Pattern/deviations: Step-through pattern;Decreased stride length;Trunk flexed   Gait velocity interpretation: 1.31 - 2.62 ft/sec, indicative of limited community ambulator General Gait Details: pt with increased hip and trunk flexion with mod cues for posture and position in RW  Stairs            Wheelchair Mobility    Modified Rankin (Stroke Patients Only)       Balance Overall  balance assessment: Mild deficits observed, not formally tested                                           Pertinent Vitals/Pain Pain Assessment: 0-10 Pain Score: 3  Pain Location: incision Pain Descriptors / Indicators: Aching Pain Intervention(s): Limited activity within patient's tolerance;Repositioned;Monitored during session    Home Living Family/patient expects to be discharged to:: Private residence Living Arrangements: Alone Available Help at Discharge: Friend(s);Available PRN/intermittently Type of Home: House Home Access: Level entry     Home Layout: One level Home Equipment: Walker - 2 wheels;Shower seat;Bedside commode      Prior Function Level of Independence: Independent         Comments: lives in [place] home but 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[employer] [place] that she will stay in short term     Hand Dominance        Extremity/Trunk Assessment   Upper Extremity Assessment Upper Extremity Assessment: Overall WFL for tasks assessed    Lower Extremity Assessment Lower Extremity Assessment: Overall WFL for tasks assessed    Cervical / Trunk Assessment Cervical / Trunk Assessment: Other exceptions Cervical / Trunk Exceptions: post surgical  Communication   Communication: No difficulties  Cognition Arousal/Alertness: Awake/alert Behavior During Therapy: WFL for tasks assessed/performed Overall Cognitive Status: Within Functional Limits for tasks assessed  General Comments      Exercises     Assessment/Plan    PT Assessment Patient needs continued PT services  PT Problem List Decreased range of motion;Decreased mobility;Decreased activity tolerance;Decreased knowledge of use of DME       PT Treatment Interventions Gait training;Stair training;Functional mobility training;Therapeutic activities;Patient/family education;DME instruction;Therapeutic exercise    PT Goals  (Current goals can be found in the Care Plan section)  Acute Rehab PT Goals Patient Stated Goal: return home with my dog PT Goal Formulation: With patient Time For Goal Achievement: 08/09/20 Potential to Achieve Goals: Good    Frequency Min 5X/week   Barriers to discharge Decreased caregiver support      Co-evaluation               AM-PAC PT "6 Clicks" Mobility  Outcome Measure Help needed turning from your back to your side while in a flat bed without using bedrails?: A Little Help needed moving from lying on your back to sitting on the side of a flat bed without using bedrails?: A Little Help needed moving to and from a bed to a chair (including a wheelchair)?: None Help needed standing up from a chair using your arms (e.g., wheelchair or bedside chair)?: None Help needed to walk in hospital room?: A Little Help needed climbing 3-5 steps with a railing? : A Little 6 Click Score: 20    End of Session   Activity Tolerance: Patient tolerated treatment well Patient left: in bed;with call bell/phone within reach Nurse Communication: Mobility status PT Visit Diagnosis: Other abnormalities of gait and mobility (R26.89);Difficulty in walking, not elsewhere classified (R26.2)    Time: 3009-2330 PT Time Calculation (min) (ACUTE ONLY): 19 min   Charges:   PT Evaluation $PT Eval Moderate Complexity: 1 Mod          Dessa Ledee P, PT Acute Rehabilitation Services Pager: [phone] Office: [phone]   Sandy Salaam Kelby Lotspeich 08/02/2020, 9:48 AM

## 2020-08-02 NOTE — Progress Notes (Deleted)
Patient is transferred to OR at this time. Alert and in stable condition. Report given to receiving nurse Adrian, RN with all questions answered. Left unit via bed 

## 2020-08-08 ENCOUNTER — Encounter (HOSPITAL_COMMUNITY): Payer: Self-pay | Admitting: Neurosurgery

## 2020-10-23 HISTORY — PX: COLONOSCOPY W/ POLYPECTOMY: SHX1380

## 2020-12-21 ENCOUNTER — Other Ambulatory Visit: Payer: Self-pay | Admitting: Neurosurgery

## 2020-12-21 DIAGNOSIS — M25551 Pain in right hip: Secondary | ICD-10-CM

## 2021-01-15 ENCOUNTER — Other Ambulatory Visit: Payer: Self-pay

## 2021-01-15 ENCOUNTER — Ambulatory Visit
Admission: RE | Admit: 2021-01-15 | Discharge: 2021-01-15 | Disposition: A | Discharge status: Home / Self Care | Payer: Medicare Other | Source: Ambulatory Visit | Attending: Neurosurgery | Admitting: Neurosurgery

## 2021-01-15 DIAGNOSIS — M25551 Pain in right hip: Secondary | ICD-10-CM

## 2021-02-14 ENCOUNTER — Other Ambulatory Visit: Payer: Self-pay | Admitting: Neurosurgery

## 2021-02-14 DIAGNOSIS — S32591A Other specified fracture of right pubis, initial encounter for closed fracture: Secondary | ICD-10-CM

## 2021-02-21 ENCOUNTER — Ambulatory Visit
Admission: RE | Admit: 2021-02-21 | Discharge: 2021-02-21 | Disposition: A | Discharge status: Home / Self Care | Payer: Medicare Other | Source: Ambulatory Visit | Attending: Neurosurgery | Admitting: Neurosurgery

## 2021-02-21 ENCOUNTER — Other Ambulatory Visit: Payer: Self-pay

## 2021-02-21 ENCOUNTER — Other Ambulatory Visit: Payer: Self-pay | Admitting: Neurosurgery

## 2021-02-21 DIAGNOSIS — S32048K Other fracture of fourth lumbar vertebra, subsequent encounter for fracture with nonunion: Secondary | ICD-10-CM

## 2021-03-06 ENCOUNTER — Other Ambulatory Visit: Payer: Self-pay | Admitting: Neurosurgery

## 2021-03-22 ENCOUNTER — Other Ambulatory Visit: Payer: Self-pay | Admitting: Neurosurgery

## 2021-03-22 NOTE — Pre-Procedure Instructions (Signed)
Surgical Instructions    Your procedure is scheduled on Wednesday, January 11th.  Report to [facility] Main Entrance "A" at 06:30 A.M., then check in with the Admitting office.  Call this number if you have problems the morning of surgery:  [phone]   If you have any questions prior to your surgery date call [phone]: Open Monday-Friday 8am-4pm    Remember:  Do not eat or drink after midnight the night before your surgery      Take these medicines the morning of surgery with A SIP OF WATER  amLODipine (NORVASC) atorvastatin (LIPITOR) cetirizine (ZYRTEC)  esomeprazole (NEXIUM)  gabapentin (NEURONTIN) predniSONE (DELTASONE)   If needed: oxyCODONE-acetaminophen (PERCOCET/ROXICET)  sucralfate (CARAFATE)  Hold hydroxychloroquine (PLAQUENIL) and leflunomide (ARAVA) for 3 days prior to surgery.  As of today, STOP taking any Aspirin (unless otherwise instructed by your surgeon) Aleve, Naproxen, Ibuprofen, Motrin, Advil, Goody's, BC's, all herbal medications, fish oil, and all vitamins.                     Do NOT Smoke (Tobacco/Vaping) or drink Alcohol 24 hours prior to your procedure.  If you use a CPAP at night, you may bring all equipment for your overnight stay.   Contacts, glasses, piercing's, hearing aid's, dentures or partials may not be worn into surgery, please bring cases for these belongings.    For patients admitted to the hospital, discharge time will be determined by your treatment team.   Patients discharged the day of surgery will not be allowed to drive home, and someone needs to stay with them for 24 hours.  NO VISITORS WILL BE ALLOWED IN PRE-OP WHERE PATIENTS GET READY FOR SURGERY.  ONLY 1 SUPPORT PERSON MAY BE PRESENT IN THE WAITING ROOM WHILE YOU ARE IN SURGERY.  IF YOU ARE TO BE ADMITTED, ONCE YOU ARE IN YOUR ROOM YOU WILL BE ALLOWED TWO (2) VISITORS.  Minor children may have two parents present. Special consideration for safety and communication needs  will be reviewed on a case by case basis.   Special instructions:   [facility]- Preparing For Surgery  Before surgery, you can play an important role. Because skin is not sterile, your skin needs to be as free of germs as possible. You can reduce the number of germs on your skin by washing with CHG (chlorahexidine gluconate) Soap before surgery.  CHG is an antiseptic cleaner which kills germs and bonds with the skin to continue killing germs even after washing.    Oral Hygiene is also important to reduce your risk of infection.  Remember - BRUSH YOUR TEETH THE MORNING OF SURGERY WITH YOUR REGULAR TOOTHPASTE  Please do not use if you have an allergy to CHG or antibacterial soaps. If your skin becomes reddened/irritated stop using the CHG.  Do not shave (including legs and underarms) for at least 48 hours prior to first CHG shower. It is OK to shave your face.  Please follow these instructions carefully.   Shower the NIGHT BEFORE SURGERY and the MORNING OF SURGERY  If you chose to wash your hair, wash your hair first as usual with your normal shampoo.  After you shampoo, rinse your hair and body thoroughly to remove the shampoo.  Use CHG Soap as you would any other liquid soap. You can apply CHG directly to the skin and wash gently with a scrungie or a clean washcloth.   Apply the CHG Soap to your body ONLY FROM THE NECK DOWN.  Do not use on open wounds or open sores. Avoid contact with your eyes, ears, mouth and genitals (private parts). Wash Face and genitals (private parts)  with your normal soap.   Wash thoroughly, paying special attention to the area where your surgery will be performed.  Thoroughly rinse your body with warm water from the neck down.  DO NOT shower/wash with your normal soap after using and rinsing off the CHG Soap.  Pat yourself dry with a CLEAN TOWEL.  Wear CLEAN PAJAMAS to bed the night before surgery  Place CLEAN SHEETS on your bed the night before your  surgery  DO NOT SLEEP WITH PETS.   Day of Surgery: Shower with CHG soap. Do not wear jewelry, make up, nail polish, gel polish, artificial nails, or any other type of covering on natural nails including finger and toenails. If patients have artificial nails, gel coating, etc. that need to be removed by a nail salon please have this removed prior to surgery. Surgery may need to be canceled/delayed if the surgeon/ anesthesia feels like the patient is unable to be adequately monitored. Do not wear lotions, powders, perfumes, or deodorant. Do not shave 48 hours prior to surgery.   Do not bring valuables to the hospital. [facility] is not responsible for any belongings or valuables. Wear Clean/Comfortable clothing the morning of surgery Remember to brush your teeth WITH YOUR REGULAR TOOTHPASTE.   Please read over the following fact sheets that you were given.   3 days prior to your procedure or After your COVID test   You are not required to quarantine however you are required to wear a well-fitting mask when you are out and around people not in your household. If your mask becomes wet or soiled, replace with a new one.   Wash your hands often with soap and water for 20 seconds or clean your hands with an alcohol-based hand sanitizer that contains at least 60% alcohol.   Do not share personal items.   Notify your provider:  o if you are in close contact with someone who has COVID  o or if you develop a fever of 100.4 or greater, sneezing, cough, sore throat, shortness of breath or body aches.

## 2021-03-25 ENCOUNTER — Encounter (HOSPITAL_COMMUNITY): Payer: Self-pay

## 2021-03-25 ENCOUNTER — Other Ambulatory Visit: Payer: Self-pay

## 2021-03-25 ENCOUNTER — Encounter (HOSPITAL_COMMUNITY)
Admission: RE | Admit: 2021-03-25 | Discharge: 2021-03-25 | Disposition: A | Discharge status: Home / Self Care | Payer: Medicare Other | Source: Ambulatory Visit | Attending: Neurosurgery | Admitting: Neurosurgery

## 2021-03-25 VITALS — BP 159/97 | HR 106 | Temp 98.5°F | Resp 18 | Ht 64.0 in | Wt 196.7 lb

## 2021-03-25 DIAGNOSIS — R7303 Prediabetes: Secondary | ICD-10-CM | POA: Insufficient documentation

## 2021-03-25 DIAGNOSIS — I10 Essential (primary) hypertension: Secondary | ICD-10-CM | POA: Insufficient documentation

## 2021-03-25 DIAGNOSIS — Z20822 Contact with and (suspected) exposure to covid-19: Secondary | ICD-10-CM | POA: Insufficient documentation

## 2021-03-25 DIAGNOSIS — Z01818 Encounter for other preprocedural examination: Secondary | ICD-10-CM | POA: Insufficient documentation

## 2021-03-25 LAB — CBC
HCT: 33.9 % — ABNORMAL LOW (ref 36.0–46.0)
Hemoglobin: 10.5 g/dL — ABNORMAL LOW (ref 12.0–15.0)
MCH: 24.4 pg — ABNORMAL LOW (ref 26.0–34.0)
MCHC: 31 g/dL (ref 30.0–36.0)
MCV: 78.7 fL — ABNORMAL LOW (ref 80.0–100.0)
Platelets: 389 10*3/uL (ref 150–400)
RBC: 4.31 MIL/uL (ref 3.87–5.11)
RDW: 16.1 % — ABNORMAL HIGH (ref 11.5–15.5)
WBC: 8.6 10*3/uL (ref 4.0–10.5)
nRBC: 0 % (ref 0.0–0.2)

## 2021-03-25 LAB — BASIC METABOLIC PANEL
Anion gap: 8 (ref 5–15)
BUN: 18 mg/dL (ref 8–23)
CO2: 25 mmol/L (ref 22–32)
Calcium: 8.3 mg/dL — ABNORMAL LOW (ref 8.9–10.3)
Chloride: 107 mmol/L (ref 98–111)
Creatinine, Ser: 0.98 mg/dL (ref 0.44–1.00)
GFR, Estimated: >60 mL/min (ref >60)
Glucose, Bld: 117 mg/dL — ABNORMAL HIGH (ref 70–99)
Potassium: 4.3 mmol/L (ref 3.5–5.1)
Sodium: 140 mmol/L (ref 135–145)

## 2021-03-25 LAB — TYPE AND SCREEN
ABO/RH(D): A NEG
Antibody Screen: NEGATIVE

## 2021-03-25 LAB — HEMOGLOBIN A1C
Hgb A1c MFr Bld: 6.1 % — ABNORMAL HIGH (ref 4.8–5.6)
Mean Plasma Glucose: 128.37 mg/dL

## 2021-03-25 LAB — SURGICAL PCR SCREEN
MRSA, PCR: NEGATIVE
Staphylococcus aureus: NEGATIVE

## 2021-03-25 LAB — GLUCOSE, CAPILLARY: Glucose-Capillary: 124 mg/dL — ABNORMAL HIGH (ref 70–99)

## 2021-03-25 NOTE — Progress Notes (Signed)
PCP - Dr. Velna Hatchet Cardiologist - denies  PPM/ICD - denies   Chest x-ray - 02/11/19 EKG - 03/25/21 at PAT Stress Test - 06/29/13 ECHO - 06/28/13 Cardiac Cath - denies  Sleep Study - denies   DM- Pre-diabetic, pt does not check CBG at home  Blood Thinner Instructions: n/a Aspirin Instructions: n/a  ERAS Protcol - no, NPO   COVID TEST- 03/25/21 at PAT   Anesthesia review: no  Patient denies shortness of breath, fever, cough and chest pain at PAT appointment   All instructions explained to the patient, with a verbal understanding of the material. Patient agrees to go over the instructions while at home for a better understanding. Patient also instructed to wear a mask in public after being tested for COVID-19. The opportunity to ask questions was provided.

## 2021-03-26 LAB — SARS CORONAVIRUS 2 (TAT 6-24 HRS): SARS Coronavirus 2: NEGATIVE

## 2021-03-27 ENCOUNTER — Inpatient Hospital Stay (HOSPITAL_COMMUNITY): Payer: Medicare Other

## 2021-03-27 ENCOUNTER — Encounter (HOSPITAL_COMMUNITY): Payer: Self-pay | Admitting: Neurosurgery

## 2021-03-27 ENCOUNTER — Encounter (HOSPITAL_COMMUNITY)
Admission: RE | Disposition: A | Discharge status: Home / Self Care | Payer: Self-pay | Source: Home / Self Care | Attending: Neurosurgery

## 2021-03-27 ENCOUNTER — Inpatient Hospital Stay (HOSPITAL_COMMUNITY)
Admission: RE | Admit: 2021-03-27 | Discharge: 2021-04-04 | DRG: 460 | Disposition: A | Discharge status: Home / Self Care | Payer: Medicare Other | Attending: Neurosurgery | Admitting: Neurosurgery

## 2021-03-27 ENCOUNTER — Other Ambulatory Visit: Payer: Self-pay

## 2021-03-27 DIAGNOSIS — Z8249 Family history of ischemic heart disease and other diseases of the circulatory system: Secondary | ICD-10-CM | POA: Diagnosis not present

## 2021-03-27 DIAGNOSIS — M8588 Other specified disorders of bone density and structure, other site: Secondary | ICD-10-CM | POA: Diagnosis present

## 2021-03-27 DIAGNOSIS — Z85820 Personal history of malignant melanoma of skin: Secondary | ICD-10-CM

## 2021-03-27 DIAGNOSIS — T84226A Displacement of internal fixation device of vertebrae, initial encounter: Secondary | ICD-10-CM | POA: Diagnosis not present

## 2021-03-27 DIAGNOSIS — Z96643 Presence of artificial hip joint, bilateral: Secondary | ICD-10-CM | POA: Diagnosis present

## 2021-03-27 DIAGNOSIS — M960 Pseudarthrosis after fusion or arthrodesis: Principal | ICD-10-CM | POA: Diagnosis present

## 2021-03-27 DIAGNOSIS — M47896 Other spondylosis, lumbar region: Secondary | ICD-10-CM | POA: Diagnosis present

## 2021-03-27 DIAGNOSIS — F419 Anxiety disorder, unspecified: Secondary | ICD-10-CM | POA: Diagnosis present

## 2021-03-27 DIAGNOSIS — Y831 Surgical operation with implant of artificial internal device as the cause of abnormal reaction of the patient, or of later complication, without mention of misadventure at the time of the procedure: Secondary | ICD-10-CM | POA: Diagnosis present

## 2021-03-27 DIAGNOSIS — T8484XA Pain due to internal orthopedic prosthetic devices, implants and grafts, initial encounter: Secondary | ICD-10-CM | POA: Diagnosis present

## 2021-03-27 DIAGNOSIS — I10 Essential (primary) hypertension: Secondary | ICD-10-CM | POA: Diagnosis present

## 2021-03-27 DIAGNOSIS — Z7952 Long term (current) use of systemic steroids: Secondary | ICD-10-CM

## 2021-03-27 DIAGNOSIS — K219 Gastro-esophageal reflux disease without esophagitis: Secondary | ICD-10-CM | POA: Diagnosis present

## 2021-03-27 DIAGNOSIS — E7800 Pure hypercholesterolemia, unspecified: Secondary | ICD-10-CM | POA: Diagnosis present

## 2021-03-27 DIAGNOSIS — Z825 Family history of asthma and other chronic lower respiratory diseases: Secondary | ICD-10-CM | POA: Diagnosis not present

## 2021-03-27 DIAGNOSIS — S32009K Unspecified fracture of unspecified lumbar vertebra, subsequent encounter for fracture with nonunion: Secondary | ICD-10-CM | POA: Diagnosis present

## 2021-03-27 DIAGNOSIS — M9669 Fracture of other bone following insertion of orthopedic implant, joint prosthesis, or bone plate: Secondary | ICD-10-CM | POA: Diagnosis present

## 2021-03-27 DIAGNOSIS — Z8616 Personal history of COVID-19: Secondary | ICD-10-CM | POA: Diagnosis not present

## 2021-03-27 DIAGNOSIS — Z419 Encounter for procedure for purposes other than remedying health state, unspecified: Secondary | ICD-10-CM

## 2021-03-27 DIAGNOSIS — J309 Allergic rhinitis, unspecified: Secondary | ICD-10-CM | POA: Diagnosis present

## 2021-03-27 DIAGNOSIS — Z96651 Presence of right artificial knee joint: Secondary | ICD-10-CM | POA: Diagnosis present

## 2021-03-27 DIAGNOSIS — Z801 Family history of malignant neoplasm of trachea, bronchus and lung: Secondary | ICD-10-CM | POA: Diagnosis not present

## 2021-03-27 HISTORY — PX: LAMINECTOMY WITH POSTERIOR LATERAL ARTHRODESIS LEVEL 4: SHX6338

## 2021-03-27 HISTORY — PX: APPLICATION OF INTRAOPERATIVE CT SCAN: SHX6668

## 2021-03-27 LAB — GLUCOSE, CAPILLARY: Glucose-Capillary: 121 mg/dL — ABNORMAL HIGH (ref 70–99)

## 2021-03-27 SURGERY — LAMINECTOMY WITH POSTERIOR LATERAL ARTHRODESIS LEVEL 4
Anesthesia: General | Site: Back

## 2021-03-27 MED ORDER — ONDANSETRON HCL 4 MG PO TABS: 4.0000 mg | ORAL_TABLET | Freq: Four times a day (QID) | ORAL | Status: DC | PRN

## 2021-03-27 MED ORDER — KETAMINE HCL 50 MG/5ML IJ SOSY
PREFILLED_SYRINGE | INTRAMUSCULAR | Status: AC
  Filled 2021-03-27: qty 5

## 2021-03-27 MED ORDER — DEXAMETHASONE SODIUM PHOSPHATE 10 MG/ML IJ SOLN
INTRAMUSCULAR | Status: DC | PRN
  Administered 2021-03-27: 10 mg via INTRAVENOUS

## 2021-03-27 MED ORDER — OXYCODONE HCL 5 MG PO TABS: 5.0000 mg | ORAL_TABLET | Freq: Once | ORAL | Status: DC | PRN

## 2021-03-27 MED ORDER — SUCRALFATE 1 G PO TABS
1.0000 g | ORAL_TABLET | Freq: Three times a day (TID) | ORAL | Status: DC | PRN
  Administered 2021-03-27 – 2021-04-03 (×3): 1 g via ORAL
  Filled 2021-03-27 (×4): qty 1

## 2021-03-27 MED ORDER — PHENYLEPHRINE 40 MCG/ML (10ML) SYRINGE FOR IV PUSH (FOR BLOOD PRESSURE SUPPORT)
PREFILLED_SYRINGE | INTRAVENOUS | Status: DC | PRN
  Administered 2021-03-27 (×2): 80 ug via INTRAVENOUS
  Administered 2021-03-27: 40 ug via INTRAVENOUS

## 2021-03-27 MED ORDER — ONDANSETRON HCL 4 MG/2ML IJ SOLN
INTRAMUSCULAR | Status: AC
  Filled 2021-03-27: qty 2

## 2021-03-27 MED ORDER — LIDOCAINE 2% (20 MG/ML) 5 ML SYRINGE
INTRAMUSCULAR | Status: AC
  Filled 2021-03-27: qty 5

## 2021-03-27 MED ORDER — DEXMEDETOMIDINE (PRECEDEX) IN NS 20 MCG/5ML (4 MCG/ML) IV SYRINGE
PREFILLED_SYRINGE | INTRAVENOUS | Status: DC | PRN
  Administered 2021-03-27: 8 ug via INTRAVENOUS

## 2021-03-27 MED ORDER — CHLORPHENIRAMINE-PSE-IBUPROFEN 2-30-200 MG PO TABS: 1.0000 | ORAL_TABLET | Freq: Every day | ORAL | Status: DC | PRN

## 2021-03-27 MED ORDER — MENTHOL 3 MG MT LOZG: 1.0000 | LOZENGE | OROMUCOSAL | Status: DC | PRN

## 2021-03-27 MED ORDER — ACETAMINOPHEN 500 MG PO TABS: 1000.0000 mg | ORAL_TABLET | Freq: Once | ORAL | Status: DC

## 2021-03-27 MED ORDER — ALUM & MAG HYDROXIDE-SIMETH 200-200-20 MG/5ML PO SUSP: 30.0000 mL | Freq: Four times a day (QID) | ORAL | Status: DC | PRN

## 2021-03-27 MED ORDER — PROMETHAZINE HCL 25 MG/ML IJ SOLN: 6.2500 mg | INTRAMUSCULAR | Status: DC | PRN

## 2021-03-27 MED ORDER — AMLODIPINE BESYLATE 5 MG PO TABS
5.0000 mg | ORAL_TABLET | Freq: Every day | ORAL | Status: DC
Start: 2021-03-28 — End: 2021-04-04
  Administered 2021-03-28 – 2021-04-04 (×7): 5 mg via ORAL
  Filled 2021-03-27 (×8): qty 1

## 2021-03-27 MED ORDER — ORAL CARE MOUTH RINSE: 15.0000 mL | Freq: Once | OROMUCOSAL | Status: AC

## 2021-03-27 MED ORDER — OYSTER SHELL CALCIUM/D3 500-5 MG-MCG PO TABS
0.5000 | ORAL_TABLET | Freq: Every day | ORAL | Status: DC
  Administered 2021-03-28 – 2021-04-04 (×7): 0.5 via ORAL
  Filled 2021-03-27 (×8): qty 1

## 2021-03-27 MED ORDER — HYDROMORPHONE HCL 1 MG/ML IJ SOLN
INTRAMUSCULAR | Status: AC
  Filled 2021-03-27: qty 1

## 2021-03-27 MED ORDER — OXYCODONE-ACETAMINOPHEN 5-325 MG PO TABS
0.5000 | ORAL_TABLET | Freq: Four times a day (QID) | ORAL | Status: DC | PRN
  Administered 2021-03-29 – 2021-04-03 (×10): 1 via ORAL
  Filled 2021-03-27 (×10): qty 1

## 2021-03-27 MED ORDER — THROMBIN 5000 UNITS EX SOLR
CUTANEOUS | Status: AC
  Filled 2021-03-27: qty 5000

## 2021-03-27 MED ORDER — OXYCODONE HCL 5 MG/5ML PO SOLN: 5.0000 mg | Freq: Once | ORAL | Status: DC | PRN

## 2021-03-27 MED ORDER — DEXAMETHASONE SODIUM PHOSPHATE 10 MG/ML IJ SOLN
INTRAMUSCULAR | Status: AC
  Filled 2021-03-27: qty 1

## 2021-03-27 MED ORDER — SUGAMMADEX SODIUM 200 MG/2ML IV SOLN
INTRAVENOUS | Status: DC | PRN
  Administered 2021-03-27: 200 mg via INTRAVENOUS

## 2021-03-27 MED ORDER — PHENYLEPHRINE 40 MCG/ML (10ML) SYRINGE FOR IV PUSH (FOR BLOOD PRESSURE SUPPORT)
PREFILLED_SYRINGE | INTRAVENOUS | Status: AC
  Filled 2021-03-27: qty 10

## 2021-03-27 MED ORDER — HYDROMORPHONE HCL 2 MG PO TABS
4.0000 mg | ORAL_TABLET | ORAL | Status: DC | PRN
  Administered 2021-03-27 – 2021-04-02 (×12): 4 mg via ORAL
  Filled 2021-03-27 (×13): qty 2

## 2021-03-27 MED ORDER — PANTOPRAZOLE SODIUM 40 MG PO TBEC: 40.0000 mg | DELAYED_RELEASE_TABLET | Freq: Every day | ORAL | Status: DC

## 2021-03-27 MED ORDER — PANTOPRAZOLE SODIUM 40 MG IV SOLR
40.0000 mg | Freq: Every day | INTRAVENOUS | Status: DC
  Administered 2021-03-27: 40 mg via INTRAVENOUS
  Filled 2021-03-27: qty 40

## 2021-03-27 MED ORDER — PREDNISONE 5 MG PO TABS
5.0000 mg | ORAL_TABLET | Freq: Every day | ORAL | Status: DC
  Administered 2021-03-28 – 2021-04-04 (×7): 5 mg via ORAL
  Filled 2021-03-27 (×8): qty 1

## 2021-03-27 MED ORDER — ALPRAZOLAM 0.25 MG PO TABS
1.0000 mg | ORAL_TABLET | Freq: Every day | ORAL | Status: DC
  Administered 2021-03-27 – 2021-04-03 (×8): 1 mg via ORAL
  Filled 2021-03-27 (×8): qty 4

## 2021-03-27 MED ORDER — HYDROMORPHONE HCL 1 MG/ML IJ SOLN
0.2500 mg | INTRAMUSCULAR | Status: DC | PRN
  Administered 2021-03-27: 0.25 mg via INTRAVENOUS
  Administered 2021-03-27 (×2): 0.5 mg via INTRAVENOUS
  Administered 2021-03-27: 0.25 mg via INTRAVENOUS

## 2021-03-27 MED ORDER — LACTATED RINGERS IV SOLN: INTRAVENOUS | Status: DC | PRN

## 2021-03-27 MED ORDER — FENTANYL CITRATE (PF) 100 MCG/2ML IJ SOLN: INTRAMUSCULAR | Status: DC | PRN

## 2021-03-27 MED ORDER — SODIUM CHLORIDE 0.9% FLUSH
3.0000 mL | Freq: Two times a day (BID) | INTRAVENOUS | Status: DC
  Administered 2021-03-27 – 2021-04-04 (×13): 3 mL via INTRAVENOUS

## 2021-03-27 MED ORDER — CHLORHEXIDINE GLUCONATE CLOTH 2 % EX PADS: 6.0000 | MEDICATED_PAD | Freq: Once | CUTANEOUS | Status: DC

## 2021-03-27 MED ORDER — LACTATED RINGERS IV SOLN: INTRAVENOUS | Status: DC

## 2021-03-27 MED ORDER — GABAPENTIN 300 MG PO CAPS
600.0000 mg | ORAL_CAPSULE | Freq: Two times a day (BID) | ORAL | Status: DC
  Administered 2021-03-27 – 2021-03-28 (×2): 600 mg via ORAL
  Filled 2021-03-27 (×2): qty 2

## 2021-03-27 MED ORDER — PROPOFOL 10 MG/ML IV BOLUS
INTRAVENOUS | Status: AC
  Filled 2021-03-27: qty 20

## 2021-03-27 MED ORDER — THROMBIN 20000 UNITS EX SOLR
CUTANEOUS | Status: DC | PRN
  Administered 2021-03-27: 20 mL via TOPICAL

## 2021-03-27 MED ORDER — CAPSAICIN-MENTHOL 0.025-1.25 % EX PTCH: 1.0000 | MEDICATED_PATCH | Freq: Every day | CUTANEOUS | Status: DC | PRN

## 2021-03-27 MED ORDER — ROCURONIUM BROMIDE 10 MG/ML (PF) SYRINGE
PREFILLED_SYRINGE | INTRAVENOUS | Status: AC
  Filled 2021-03-27: qty 10

## 2021-03-27 MED ORDER — ROCURONIUM BROMIDE 10 MG/ML (PF) SYRINGE
PREFILLED_SYRINGE | INTRAVENOUS | Status: DC | PRN
  Administered 2021-03-27 (×3): 10 mg via INTRAVENOUS
  Administered 2021-03-27: 100 mg via INTRAVENOUS
  Administered 2021-03-27: 20 mg via INTRAVENOUS

## 2021-03-27 MED ORDER — CYCLOBENZAPRINE HCL 10 MG PO TABS
10.0000 mg | ORAL_TABLET | Freq: Three times a day (TID) | ORAL | Status: DC | PRN
  Administered 2021-03-28 – 2021-03-31 (×5): 10 mg via ORAL
  Filled 2021-03-27 (×5): qty 1

## 2021-03-27 MED ORDER — PHENOL 1.4 % MT LIQD: 1.0000 | OROMUCOSAL | Status: DC | PRN

## 2021-03-27 MED ORDER — MIDAZOLAM HCL 2 MG/2ML IJ SOLN
INTRAMUSCULAR | Status: AC
  Filled 2021-03-27: qty 2

## 2021-03-27 MED ORDER — HYDROMORPHONE HCL 1 MG/ML IJ SOLN
0.5000 mg | INTRAMUSCULAR | Status: DC | PRN
  Administered 2021-03-28 – 2021-04-01 (×8): 0.5 mg via INTRAVENOUS
  Filled 2021-03-27 (×8): qty 0.5

## 2021-03-27 MED ORDER — LIDOCAINE-EPINEPHRINE 1 %-1:100000 IJ SOLN
INTRAMUSCULAR | Status: DC | PRN
  Administered 2021-03-27: 5 mL

## 2021-03-27 MED ORDER — FENTANYL CITRATE (PF) 100 MCG/2ML IJ SOLN
INTRAMUSCULAR | Status: DC | PRN
  Administered 2021-03-27 (×4): 50 ug via INTRAVENOUS

## 2021-03-27 MED ORDER — PROPOFOL 500 MG/50ML IV EMUL
INTRAVENOUS | Status: DC | PRN
  Administered 2021-03-27: 50 ug/kg/min via INTRAVENOUS
  Administered 2021-03-27: 150 mg via INTRAVENOUS

## 2021-03-27 MED ORDER — LIDOCAINE 2% (20 MG/ML) 5 ML SYRINGE
INTRAMUSCULAR | Status: DC | PRN
  Administered 2021-03-27: 60 mg via INTRAVENOUS

## 2021-03-27 MED ORDER — EPHEDRINE 5 MG/ML INJ
INTRAVENOUS | Status: AC
  Filled 2021-03-27: qty 5

## 2021-03-27 MED ORDER — HAIR/SKIN/NAILS PO CAPS: 1.0000 | ORAL_CAPSULE | Freq: Every day | ORAL | Status: DC

## 2021-03-27 MED ORDER — ACETAMINOPHEN 650 MG RE SUPP: 650.0000 mg | RECTAL | Status: DC | PRN

## 2021-03-27 MED ORDER — HYDROMORPHONE HCL 1 MG/ML IJ SOLN
INTRAMUSCULAR | Status: AC
  Filled 2021-03-27: qty 0.5

## 2021-03-27 MED ORDER — CEFAZOLIN SODIUM-DEXTROSE 2-4 GM/100ML-% IV SOLN
2.0000 g | INTRAVENOUS | Status: AC
  Administered 2021-03-27 (×2): 2 g via INTRAVENOUS
  Filled 2021-03-27: qty 100

## 2021-03-27 MED ORDER — SODIUM CHLORIDE 0.9% FLUSH: 3.0000 mL | INTRAVENOUS | Status: DC | PRN

## 2021-03-27 MED ORDER — HYDROXYCHLOROQUINE SULFATE 200 MG PO TABS
200.0000 mg | ORAL_TABLET | Freq: Every day | ORAL | Status: DC
  Administered 2021-03-28 – 2021-04-04 (×7): 200 mg via ORAL
  Filled 2021-03-27 (×8): qty 1

## 2021-03-27 MED ORDER — PHENYLEPHRINE HCL-NACL 20-0.9 MG/250ML-% IV SOLN
INTRAVENOUS | Status: DC | PRN
  Administered 2021-03-27: 15 ug/min via INTRAVENOUS

## 2021-03-27 MED ORDER — AMISULPRIDE (ANTIEMETIC) 5 MG/2ML IV SOLN: 10.0000 mg | Freq: Once | INTRAVENOUS | Status: DC | PRN

## 2021-03-27 MED ORDER — LORATADINE 10 MG PO TABS
10.0000 mg | ORAL_TABLET | Freq: Every day | ORAL | Status: DC
  Administered 2021-03-28 – 2021-04-04 (×7): 10 mg via ORAL
  Filled 2021-03-27 (×8): qty 1

## 2021-03-27 MED ORDER — NORTRIPTYLINE HCL 25 MG PO CAPS
25.0000 mg | ORAL_CAPSULE | Freq: Every day | ORAL | Status: DC
  Administered 2021-03-27 – 2021-04-03 (×8): 25 mg via ORAL
  Filled 2021-03-27 (×8): qty 1

## 2021-03-27 MED ORDER — CEFAZOLIN SODIUM-DEXTROSE 2-4 GM/100ML-% IV SOLN
2.0000 g | Freq: Three times a day (TID) | INTRAVENOUS | Status: AC
  Administered 2021-03-27 – 2021-03-28 (×2): 2 g via INTRAVENOUS
  Filled 2021-03-27 (×2): qty 100

## 2021-03-27 MED ORDER — THROMBIN 20000 UNITS EX SOLR
CUTANEOUS | Status: AC
  Filled 2021-03-27: qty 20000

## 2021-03-27 MED ORDER — KETAMINE HCL 10 MG/ML IJ SOLN
INTRAMUSCULAR | Status: DC | PRN
  Administered 2021-03-27 (×2): 10 mg via INTRAVENOUS
  Administered 2021-03-27: 30 mg via INTRAVENOUS

## 2021-03-27 MED ORDER — CHLORHEXIDINE GLUCONATE 0.12 % MT SOLN
15.0000 mL | Freq: Once | OROMUCOSAL | Status: AC
  Administered 2021-03-27: 15 mL via OROMUCOSAL
  Filled 2021-03-27: qty 15

## 2021-03-27 MED ORDER — FENTANYL CITRATE (PF) 250 MCG/5ML IJ SOLN
INTRAMUSCULAR | Status: AC
  Filled 2021-03-27: qty 5

## 2021-03-27 MED ORDER — ROCURONIUM BROMIDE 10 MG/ML (PF) SYRINGE
PREFILLED_SYRINGE | INTRAVENOUS | Status: AC
  Filled 2021-03-27: qty 20

## 2021-03-27 MED ORDER — ATORVASTATIN CALCIUM 10 MG PO TABS
20.0000 mg | ORAL_TABLET | Freq: Every day | ORAL | Status: DC
  Administered 2021-03-28 – 2021-04-04 (×8): 20 mg via ORAL
  Filled 2021-03-27 (×8): qty 2

## 2021-03-27 MED ORDER — LEFLUNOMIDE 20 MG PO TABS
20.0000 mg | ORAL_TABLET | Freq: Every day | ORAL | Status: DC
  Administered 2021-03-28 – 2021-04-04 (×7): 20 mg via ORAL
  Filled 2021-03-27 (×8): qty 1

## 2021-03-27 MED ORDER — ONDANSETRON HCL 4 MG/2ML IJ SOLN
INTRAMUSCULAR | Status: DC | PRN
Start: 2021-03-27 — End: 2021-03-27
  Administered 2021-03-27: 4 mg via INTRAVENOUS

## 2021-03-27 MED ORDER — SODIUM CHLORIDE 0.9 % IV SOLN
250.0000 mL | INTRAVENOUS | Status: DC
  Administered 2021-03-27: 250 mL via INTRAVENOUS

## 2021-03-27 MED ORDER — HYDROMORPHONE HCL 1 MG/ML IJ SOLN
INTRAMUSCULAR | Status: DC | PRN
  Administered 2021-03-27 (×2): .5 mg via INTRAVENOUS

## 2021-03-27 MED ORDER — ONDANSETRON HCL 4 MG/2ML IJ SOLN
4.0000 mg | Freq: Four times a day (QID) | INTRAMUSCULAR | Status: DC | PRN
  Administered 2021-03-27: 4 mg via INTRAVENOUS
  Filled 2021-03-27: qty 2

## 2021-03-27 MED ORDER — PROPOFOL 1000 MG/100ML IV EMUL
INTRAVENOUS | Status: AC
  Filled 2021-03-27: qty 100

## 2021-03-27 MED ORDER — LISINOPRIL 20 MG PO TABS
40.0000 mg | ORAL_TABLET | Freq: Every day | ORAL | Status: DC
Start: 2021-03-28 — End: 2021-04-04
  Administered 2021-03-28 – 2021-04-04 (×7): 40 mg via ORAL
  Filled 2021-03-27 (×8): qty 2

## 2021-03-27 MED ORDER — ACETAMINOPHEN 10 MG/ML IV SOLN
INTRAVENOUS | Status: DC | PRN
  Administered 2021-03-27: 1000 mg via INTRAVENOUS

## 2021-03-27 MED ORDER — ACETAMINOPHEN 325 MG PO TABS: 650.0000 mg | ORAL_TABLET | ORAL | Status: DC | PRN

## 2021-03-27 MED ORDER — BUPIVACAINE LIPOSOME 1.3 % IJ SUSP
INTRAMUSCULAR | Status: AC
  Filled 2021-03-27: qty 20

## 2021-03-27 MED ORDER — LIDOCAINE-EPINEPHRINE 1 %-1:100000 IJ SOLN
INTRAMUSCULAR | Status: AC
  Filled 2021-03-27: qty 1

## 2021-03-27 MED ORDER — 0.9 % SODIUM CHLORIDE (POUR BTL) OPTIME
TOPICAL | Status: DC | PRN
  Administered 2021-03-27: 1000 mL

## 2021-03-27 MED ORDER — BUPIVACAINE HCL (PF) 0.25 % IJ SOLN
INTRAMUSCULAR | Status: AC
  Filled 2021-03-27: qty 30

## 2021-03-27 MED ORDER — ACETAMINOPHEN 10 MG/ML IV SOLN
INTRAVENOUS | Status: AC
  Filled 2021-03-27: qty 100

## 2021-03-27 MED ORDER — ADULT MULTIVITAMIN W/MINERALS CH
1.0000 | ORAL_TABLET | Freq: Every day | ORAL | Status: DC
  Administered 2021-03-28 – 2021-04-04 (×7): 1 via ORAL
  Filled 2021-03-27 (×8): qty 1

## 2021-03-27 SURGICAL SUPPLY — 89 items
ADH SKN CLS APL DERMABOND .7 (GAUZE/BANDAGES/DRESSINGS) ×6
APL SKNCLS STERI-STRIP NONHPOA (GAUZE/BANDAGES/DRESSINGS) ×2
BAG COUNTER SPONGE SURGICOUNT (BAG) ×5 IMPLANT
BAG SPNG CNTER NS LX DISP (BAG) ×4
BASKET BONE COLLECTION (BASKET) IMPLANT
BENZOIN TINCTURE PRP APPL 2/3 (GAUZE/BANDAGES/DRESSINGS) ×4 IMPLANT
BLADE CLIPPER SURG (BLADE) IMPLANT
BLADE SURG 11 STRL SS (BLADE) ×4 IMPLANT
BONE VIVIGEN FORMABLE 10CC (Bone Implant) ×3 IMPLANT
BUR MATCHSTICK NEURO 3.0 LAGG (BURR) ×4 IMPLANT
BUR PRECISION FLUTE 6.0 (BURR) ×4 IMPLANT
CANISTER SUCT 3000ML PPV (MISCELLANEOUS) ×4 IMPLANT
CAP LOCKING THREADED (Cap) ×10 IMPLANT
CARTRIDGE OIL MAESTRO DRILL (MISCELLANEOUS) ×6 IMPLANT
CLSR STERI-STRIP ANTIMIC 1/2X4 (GAUZE/BANDAGES/DRESSINGS) ×1 IMPLANT
CNTNR URN SCR LID CUP LEK RST (MISCELLANEOUS) ×3 IMPLANT
CONNECTOR INLINE 5.5-5.5 (Connector) ×5 IMPLANT
CONT SPEC 4OZ STRL OR WHT (MISCELLANEOUS) ×3
COVER BACK TABLE 24X17X13 BIG (DRAPES) ×1 IMPLANT
COVER BACK TABLE 60X90IN (DRAPES) ×13 IMPLANT
DECANTER SPIKE VIAL GLASS SM (MISCELLANEOUS) ×3 IMPLANT
DERMABOND ADVANCED (GAUZE/BANDAGES/DRESSINGS) ×3
DERMABOND ADVANCED .7 DNX12 (GAUZE/BANDAGES/DRESSINGS) ×3 IMPLANT
DIFFUSER DRILL AIR PNEUMATIC (MISCELLANEOUS) ×8 IMPLANT
DRAPE C-ARM 42X72 X-RAY (DRAPES) ×6 IMPLANT
DRAPE HALF SHEET 40X57 (DRAPES) IMPLANT
DRAPE LAPAROTOMY 100X72X124 (DRAPES) ×4 IMPLANT
DRAPE POUCH INSTRU U-SHP 10X18 (DRAPES) ×4 IMPLANT
DRAPE SCAN PATIENT (DRAPES) ×5 IMPLANT
DRAPE SURG 17X23 STRL (DRAPES) ×3 IMPLANT
DRSG OPSITE POSTOP 4X10 (GAUZE/BANDAGES/DRESSINGS) ×1 IMPLANT
DRSG OPSITE POSTOP 4X6 (GAUZE/BANDAGES/DRESSINGS) ×1 IMPLANT
DURAPREP 26ML APPLICATOR (WOUND CARE) ×4 IMPLANT
ELECT REM PT RETURN 9FT ADLT (ELECTROSURGICAL) ×3
ELECTRODE REM PT RTRN 9FT ADLT (ELECTROSURGICAL) ×3 IMPLANT
EVACUATOR 3/16  PVC DRAIN (DRAIN) ×3
EVACUATOR 3/16 PVC DRAIN (DRAIN) ×3 IMPLANT
GAUZE 4X4 16PLY ~~LOC~~+RFID DBL (SPONGE) ×2 IMPLANT
GAUZE SPONGE 4X4 12PLY STRL (GAUZE/BANDAGES/DRESSINGS) ×3 IMPLANT
GLOVE EXAM NITRILE XL STR (GLOVE) IMPLANT
GLOVE SURG ENC MOIS LTX SZ7 (GLOVE) ×2 IMPLANT
GLOVE SURG ENC MOIS LTX SZ8 (GLOVE) ×12 IMPLANT
GLOVE SURG LTX SZ6.5 (GLOVE) ×3 IMPLANT
GLOVE SURG LTX SZ7 (GLOVE) ×3 IMPLANT
GLOVE SURG LTX SZ7.5 (GLOVE) ×2 IMPLANT
GLOVE SURG POLYISO LF SZ6 (GLOVE) ×9 IMPLANT
GLOVE SURG POLYISO LF SZ7.5 (GLOVE) ×2 IMPLANT
GLOVE SURG UNDER LTX SZ8.5 (GLOVE) ×12 IMPLANT
GLOVE SURG UNDER POLY LF SZ7 (GLOVE) ×4 IMPLANT
GOWN STRL REUS W/ TWL LRG LVL3 (GOWN DISPOSABLE) IMPLANT
GOWN STRL REUS W/ TWL XL LVL3 (GOWN DISPOSABLE) ×6 IMPLANT
GOWN STRL REUS W/TWL 2XL LVL3 (GOWN DISPOSABLE) IMPLANT
GOWN STRL REUS W/TWL LRG LVL3 (GOWN DISPOSABLE) ×18
GOWN STRL REUS W/TWL XL LVL3 (GOWN DISPOSABLE) ×9
GRAFT BN 10X1XDBM MAGNIFUSE (Bone Implant) IMPLANT
GRAFT BNE MATRIX VG FRMBL L 10 (Bone Implant) IMPLANT
GRAFT BONE MAGNIFUSE 1X10CM (Bone Implant) ×3 IMPLANT
KIT BASIN OR (CUSTOM PROCEDURE TRAY) ×4 IMPLANT
KIT INFUSE MEDIUM (Orthopedic Implant) ×1 IMPLANT
KIT TURNOVER KIT B (KITS) ×4 IMPLANT
MARKER SPHERE PSV REFLC 13MM (MARKER) ×21 IMPLANT
MILL MEDIUM DISP (BLADE) IMPLANT
NDL HYPO 21X1.5 SAFETY (NEEDLE) IMPLANT
NDL HYPO 25X1 1.5 SAFETY (NEEDLE) ×3 IMPLANT
NEEDLE HYPO 21X1.5 SAFETY (NEEDLE) ×3 IMPLANT
NEEDLE HYPO 25X1 1.5 SAFETY (NEEDLE) ×3 IMPLANT
NS IRRIG 1000ML POUR BTL (IV SOLUTION) ×4 IMPLANT
OIL CARTRIDGE MAESTRO DRILL (MISCELLANEOUS) ×3
PACK LAMINECTOMY NEURO (CUSTOM PROCEDURE TRAY) ×4 IMPLANT
PAD ARMBOARD 7.5X6 YLW CONV (MISCELLANEOUS) ×9 IMPLANT
RASP 3.0MM (RASP) ×1 IMPLANT
ROD HEX TITAL ALLOY 200MM (Rod) ×1 IMPLANT
ROD SPINE STR CREO 5.5X150 (Rod) ×1 IMPLANT
SCREW 6.5X45 (Screw) ×2 IMPLANT
SCREW CANN CREO 8.5X70 (Screw) ×2 IMPLANT
SCREW CREO 7.5X45MM (Screw) ×6 IMPLANT
SPONGE SURGIFOAM ABS GEL 100 (HEMOSTASIS) ×4 IMPLANT
SPONGE T-LAP 4X18 ~~LOC~~+RFID (SPONGE) ×1 IMPLANT
STRIP CLOSURE SKIN 1/2X4 (GAUZE/BANDAGES/DRESSINGS) ×9 IMPLANT
SUT VIC AB 0 CT1 18XCR BRD8 (SUTURE) ×6 IMPLANT
SUT VIC AB 0 CT1 8-18 (SUTURE) ×9
SUT VIC AB 2-0 CT1 18 (SUTURE) ×8 IMPLANT
SUT VICRYL 4-0 PS2 18IN ABS (SUTURE) ×4 IMPLANT
SYR 20ML LL LF (SYRINGE) ×1 IMPLANT
TOWEL GREEN STERILE (TOWEL DISPOSABLE) ×4 IMPLANT
TOWEL GREEN STERILE FF (TOWEL DISPOSABLE) ×4 IMPLANT
TRAY FOLEY MTR SLVR 14FR STAT (SET/KITS/TRAYS/PACK) ×1 IMPLANT
TRAY FOLEY MTR SLVR 16FR STAT (SET/KITS/TRAYS/PACK) ×3 IMPLANT
WATER STERILE IRR 1000ML POUR (IV SOLUTION) ×4 IMPLANT

## 2021-03-27 NOTE — H&P (Signed)
Angie Olson is an 71 y.o. female.   Chief Complaint: Back pain HPI: 71 year old female with longstanding issues with her back previously underwent multiple decompression fusion operations over successive operations.  Most recently she had hardware removal from her lower lumbar when she developed lucencies around the screws and a partial pedicle fracture.  It appeared that she had a partial fusion and we attempted to see if we could do an inside in situ fusion to get to heal with removal of hardware only.  Her pain has progressed the fracture lines have continued to not heal.  And she has had progressive worsening low back pain with radiation down her legs L5-S1 nerve root pattern.  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  So I have recommended reexploration with and under stereotactic navigation placement of pelvic fixation replacement of lumbar pedicle screws and tying into her old construct which currently exist down to L2.  I have extensively gone over the risks and benefits of that operation with her as well as perioperative course expectations of outcome and alternatives to surgery and she understands and agrees to proceed forward.  Past Medical History:  Diagnosis Date   Acid reflux    Allergic rhinitis    anesthetic complication    Respiratory arrest 05-Feb-2019   Anxiety    PT'S SON DIED 06-05-2010, husband died in 2019/06/05, Mother died in September 05, 2019   Arthritis    RA AND OA   Borderline hypercholesterolemia    on medication for TIA   Bronchitis    CHRONIC    Cancer ([place])    melanoma - face    Colon polyp    Environmental allergies    GERD (gastroesophageal reflux disease)    on PPI and carafte prn   H/O hiatal hernia    History of COVID-19 02/05/19   History of kidney stones    Hypertension    IBS (irritable bowel syndrome)    LBP (low back pain)    OA (osteoarthritis)    Left  Shoulder   Pain    RIGHT KNEE - TORN MENISCUS AND ACL   Pneumonia    X 3  - LAST TIME WAS 06-05-11   PONV (postoperative nausea and vomiting) 09-2012   sore throat after ankle surgery 6 yrs ago and severe ponv 09-2012   Pre-diabetes    patient denies - states it was "years ago and no follow up"   Pulmonary nodule seen on imaging study    Shortness of breath    with activity,bronchitis   Stroke ([place]) 03/2011   TIA--PT EXPERIENCED NUMBNESS RT HAND AND ARM , HEADACHE AND NAUSEA. NO RESIDUAL PROBLEMS    Past Surgical History:  Procedure Laterality Date   ANKLE SURGERY  ~10 years ago   left   BACK SURGERY  2009-06-04   lower   CARPAL TUNNEL RELEASE Right 06-05-19   CHOLECYSTECTOMY  ~15 years ago   COLONOSCOPY  2003-06-05, 06/05/11   HARDWARE REMOVAL N/A 04/07/2016   Procedure: HARDWARE REMOVAL;  Surgeon: Kary Kos, MD;  Location: [place];  Service: Neurosurgery;  Laterality: N/A;   HARDWARE REMOVAL N/A 08/01/2020   Procedure: Removal of hardware with cutting the rod below the Lumbar one screws and removal of bilateral Lumbar four screws;  Surgeon: Kary Kos, MD;  Location: [place];  Service: Neurosurgery;  Laterality: N/A;   HARVEST BONE GRAFT N/A 07/05/2014  Procedure: HARVEST ILIAC BONE GRAFT;  Surgeon: Kary Kos, MD;  Location: [place] [facility];  Service: Neurosurgery;  Laterality: N/A;   KNEE ARTHROSCOPY Right 10/13/2012   Procedure: RIGHT KNEE ARTHROSCOPY WITH DEBRIDEMENT, CHONDROPLASTY;  Surgeon: Gearlean Alf, MD;  Location: [facility];  Service: Orthopedics;  Laterality: Right;   KNEE ARTHROSCOPY Right 03/02/2013   Procedure: RIGHT ARTHROSCOPY KNEE WITH MEDIAL MENISCAL  DEBRIDEMENT;  Surgeon: Gearlean Alf, MD;  Location: [facility];  Service: Orthopedics;  Laterality: Right;   LAMINECTOMY WITH POSTERIOR LATERAL ARTHRODESIS LEVEL 1 N/A 08/01/2020   Procedure: posterolateral and Situ fusion;  Surgeon: Kary Kos, MD;  Location: [place];  Service: Neurosurgery;  Laterality: N/A;   LAMINECTOMY WITH POSTERIOR LATERAL  ARTHRODESIS LEVEL 4 N/A 01/23/2020   Procedure: Posterior lateral fusion - Thoracic Ten -Thoracic Eleven - Thoracic Eleven -Thoracic Twelve - Thoracic Twelve- Lumbar One - Lumbar One- Lumbar Two revision with removal globus;  Surgeon: Kary Kos, MD;  Location: [place];  Service: Neurosurgery;  Laterality: N/A;  Eleven - Thoracic Eleven -Thoracic Twelve - Thoracic Twelve- Lumbar One - Lumbar One- Lumbar Two revision with removal globus   LUMBAR FUSION  07/05/2014   LUMBAR WOUND DEBRIDEMENT N/A 01/11/2014   Procedure: Irrigation and Debridement Lumbar Wound for hematoma;  Surgeon: Elaina Hoops, MD;  Location: [place] [facility];  Service: Neurosurgery;  Laterality: N/A;   MAXIMUM ACCESS (MAS)POSTERIOR LUMBAR INTERBODY FUSION (PLIF) 1 LEVEL N/A 12/05/2013   Procedure: FOR MAXIMUM ACCESS (MAS) POSTERIOR LUMBAR INTERBODY FUSION (PLIF)LUMBAR FIVE-SACRAL-ONE,REMOVAL HARDWARE LUMBAR FOUR-FIVE;  Surgeon: Elaina Hoops, MD;  Location: [place] [facility];  Service: Neurosurgery;  Laterality: N/A;   Mohr's procedure  02/2015   face   Right Hip arthroplasty     10/01/16 Dr. Wynelle Link   ROOT CANAL     SHOULDER SURGERY Left ~14 years ago   "scope"   thumb surgery Left ~14 years ago   x2   TOTAL HIP ARTHROPLASTY Left 04/25/2015   Procedure: LEFT TOTAL HIP ARTHROPLASTY ANTERIOR APPROACH;  Surgeon: Gaynelle Arabian, MD;  [facility];  Service: Orthopedics;  Laterality: Left;   TOTAL HIP ARTHROPLASTY Right 10/01/2016   Procedure: RIGHT TOTAL HIP ARTHROPLASTY ANTERIOR APPROACH;  Surgeon: Gaynelle Arabian, MD;  [facility];  Service: Orthopedics;  Laterality: Right;   TOTAL KNEE ARTHROPLASTY Right 07/25/2013   Procedure: RIGHT TOTAL KNEE ARTHROPLASTY;  Surgeon: Gearlean Alf, MD;  [facility];  Service: Orthopedics;  Laterality: Right;   TOTAL KNEE ARTHROPLASTY WITH REVISION COMPONENTS Right 05/28/2016   Procedure: RIGHT TIBIAL POLYETHYLENE KNEE REVISION;  Surgeon: Gaynelle Arabian, MD;  [facility];  Service: Orthopedics;   Laterality: Right;  requests 36mins   TRIGGER FINGER RELEASE  ~6 years ago   X2 ON RIGHT HAND   TUBAL LIGATION      Family History  Problem Relation Age of Onset   Heart disease Sister    Lung cancer Brother    Heart disease Brother    CAD Brother    Heart attack Mother    Emphysema Father    Hypertension Daughter    Colon cancer Neg Hx    Social History:  reports that she has never smoked. She has never used smokeless tobacco. She reports that she does not drink alcohol and does not use drugs.  Allergies:  Allergies  Allergen Reactions   Methotrexate Derivatives Other (See Comments)    Causes recurrent infections   Pseudoephedrine Other (See Comments)    Makes jittery  Reglan [Metoclopramide] Other (See Comments)    insomnia   Vicodin [Hydrocodone-Acetaminophen] Other (See Comments)    "Makes me feel weird"   Other Other (See Comments)    Pollen / Closes Sinus   Oxycodone Itching and Rash    Patient tolerated hydromorphone without reaction.    Medications Prior to Admission  Medication Sig Dispense Refill   ALPRAZolam (XANAX) 1 MG tablet Take 1 mg by mouth at bedtime.     amLODipine (NORVASC) 5 MG tablet Take 5 mg by mouth in the morning.     atorvastatin (LIPITOR) 20 MG tablet Take 20 mg by mouth in the morning.     Calcium Carbonate-Vitamin D (CALCIUM-VITAMIN D3 PO) Take 1 tablet by mouth daily.     Capsaicin-Menthol (SALONPAS GEL-PATCH HOT) 0.025-1.25 % PTCH Apply 1 patch topically daily as needed (pain).     cetirizine (ZYRTEC) 10 MG tablet Take 10 mg by mouth in the morning.     Chlorpheniramine-PSE-Ibuprofen (ADVIL ALLERGY SINUS) 2-30-200 MG TABS Take 1 tablet by mouth daily as needed (Sinus headache).     esomeprazole (NEXIUM) 20 MG capsule Take 20 mg by mouth daily before breakfast.      gabapentin (NEURONTIN) 300 MG capsule Take 600 mg by mouth 2 (two) times daily.     hydroxychloroquine (PLAQUENIL) 200 MG tablet Take 200 mg by mouth in the morning.      leflunomide (ARAVA) 20 MG tablet Take 20 mg by mouth in the morning.     lisinopril (PRINIVIL,ZESTRIL) 40 MG tablet Take 40 mg by mouth in the morning.     Multiple Vitamins-Minerals (HAIR/SKIN/NAILS) CAPS Take 1 tablet by mouth daily.     Multiple Vitamins-Minerals (MULTIVITAMIN WITH MINERALS) tablet Take 1 tablet by mouth daily.     nortriptyline (PAMELOR) 25 MG capsule Take 25 mg by mouth at bedtime.   2   oxyCODONE-acetaminophen (PERCOCET/ROXICET) 5-325 MG tablet Take 0.5-1 tablets by mouth every 6 (six) hours as needed for pain.     predniSONE (DELTASONE) 5 MG tablet Take 5 mg by mouth daily with breakfast.     sucralfate (CARAFATE) 1 G tablet Take 1 g by mouth 3 (three) times daily as needed (indigestion).       Results for orders placed or performed during the hospital encounter of 03/27/21 (from the past 48 hour(s))  Glucose, capillary     Status: Abnormal   Collection Time: 03/27/21  6:39 AM  Result Value Ref Range   Glucose-Capillary 121 (H) 70 - 99 mg/dL    Comment: Glucose reference range applies only to samples taken after fasting for at least 8 hours.   No results found.  Review of Systems  Musculoskeletal:  Positive for back pain.  Neurological:  Positive for numbness.   Blood pressure (!) 160/89, pulse 92, temperature 98.1 F (36.7 C), temperature source Oral, resp. rate 18, height 5\' 4"  (1.626 m), weight 87.5 kg, SpO2 97 %. Physical Exam HENT:     Head: Normocephalic.     Right Ear: Tympanic membrane normal.     Nose: Nose normal.     Mouth/Throat:     Mouth: Mucous membranes are moist.  Eyes:     Pupils: Pupils are equal, round, and reactive to light.  Cardiovascular:     Rate and Rhythm: Normal rate.  Pulmonary:     Effort: Pulmonary effort is normal.  Abdominal:     General: Abdomen is flat.  Musculoskeletal:        General: Normal  range of motion.     Cervical back: Normal range of motion.  Skin:    General: Skin is warm.  Neurological:     Mental  Status: She is alert.     Comments: Patient is awake and alert strength is 5 out of 5 iliopsoas, quads, hamstrings, gastrocs, tibialis, and EHL.     Assessment/Plan 71 year old female presents for revision of spinal fusion with pelvic fixation L2 to the pelvis  Elaina Hoops, MD 03/27/2021, 8:14 AM

## 2021-03-27 NOTE — Transfer of Care (Signed)
Immediate Anesthesia Transfer of Care Note  Patient: Angie Olson  Procedure(s) Performed: Posterior lateral fusion - Lumbar two-Lumbar three - Lumbar three-Lumbar four - Lumbar four-Lumbar five - Lumbar five-Sacral one - Pelvis with navigation Airo (Back) APPLICATION OF INTRAOPERATIVE CT SCAN  Patient Location: PACU  Anesthesia Type:General  Level of Consciousness: drowsy  Airway & Oxygen Therapy: Patient Spontanous Breathing and Patient connected to face mask oxygen  Post-op Assessment: Report given to RN and Post -op Vital signs reviewed and stable  Post vital signs: Reviewed and stable  Last Vitals:  Vitals Value Taken Time  BP 97/62 03/27/21 1631  Temp 36.7 C 03/27/21 1630  Pulse 76 03/27/21 1638  Resp 22 03/27/21 1638  SpO2 99 % 03/27/21 1638  Vitals shown include unvalidated device data.  Last Pain:  Vitals:   03/27/21 1630  TempSrc:   PainSc: Asleep      Patients Stated Pain Goal: 2 (78/58/85 0277)  Complications: No notable events documented.

## 2021-03-27 NOTE — Anesthesia Preprocedure Evaluation (Addendum)
Anesthesia Evaluation  Patient identified by MRN, date of birth, ID band Patient awake    Reviewed: Allergy & Precautions, NPO status , Patient's Chart, lab work & pertinent test results  History of Anesthesia Complications (+) PONV and history of anesthetic complications (7616 ankle surgery PONV. respiratory arrest 01/2019)  Airway Mallampati: III  TM Distance: >3 FB Neck ROM: Full    Dental no notable dental hx. (+) Teeth Intact, Dental Advisory Given   Pulmonary shortness of breath and with exertion,  Denies snoring   Pulmonary exam normal breath sounds clear to auscultation       Cardiovascular hypertension (160/89 in preop, per pt normally a little lower but has also had as high as SBP 200s), Pt. on medications +CHF (grade 1 diastolic dysfunction) and + DOE  Normal cardiovascular exam Rhythm:Regular Rate:Normal  Echo 2015: Left ventricle: The cavity size was normal. Systolic  function was normal. The estimated ejection fraction was in  the range of 60% to 65%. Wall motion was normal; there were  no regional wall motion abnormalities. Doppler parameters  are consistent with abnormal left ventricular relaxation  (grade 1 diastolic dysfunction).      Neuro/Psych PSYCHIATRIC DISORDERS Anxiety CVA (2013), No Residual Symptoms    GI/Hepatic Neg liver ROS, hiatal hernia, GERD  Controlled and Medicated,  Endo/Other  diabetes (pre-diabetic)BMI 33  Renal/GU negative Renal ROS  negative genitourinary   Musculoskeletal  (+) Arthritis , Osteoarthritis and Rheumatoid disorders,  Lumbar fx  S/p multiple back surgeries- last one 07/2020 Takes prednisone 5mg  daily for RA- took it this AM   Abdominal (+) + obese,   Peds  Hematology negative hematology ROS (+) hct 33.9   Anesthesia Other Findings Chronic pain- percocet 5/325- takes 2.5mg  q4h  Reproductive/Obstetrics negative OB ROS                             Anesthesia Physical Anesthesia Plan  ASA: 3  Anesthesia Plan: General   Post-op Pain Management: Tylenol PO (pre-op)   Induction: Intravenous  PONV Risk Score and Plan: 4 or greater and Ondansetron, Dexamethasone, Midazolam and Treatment may vary due to age or medical condition  Airway Management Planned: Oral ETT  Additional Equipment: None  Intra-op Plan:   Post-operative Plan: Extubation in OR  Informed Consent: I have reviewed the patients History and Physical, chart, labs and discussed the procedure including the risks, benefits and alternatives for the proposed anesthesia with the patient or authorized representative who has indicated his/her understanding and acceptance.     Dental advisory given  Plan Discussed with: CRNA  Anesthesia Plan Comments: (Hx respiratory arrest 2020 in pacu requiring narcan (upon chart review she received 16mcg sufentanil for 3h case)- did not require reintubation )      Anesthesia Quick Evaluation

## 2021-03-27 NOTE — Anesthesia Procedure Notes (Signed)
Procedure Name: Intubation Date/Time: 03/27/2021 9:32 AM Performed by: Georgia Duff, CRNA Pre-anesthesia Checklist: Patient identified, Emergency Drugs available, Suction available and Patient being monitored Patient Re-evaluated:Patient Re-evaluated prior to induction Oxygen Delivery Method: Circle System Utilized Preoxygenation: Pre-oxygenation with 100% oxygen Induction Type: IV induction Ventilation: Mask ventilation without difficulty Laryngoscope Size: Mac and 3 Grade View: Grade I Tube type: Oral Tube size: 7.0 mm Number of attempts: 1 Airway Equipment and Method: Stylet Placement Confirmation: ETT inserted through vocal cords under direct vision, positive ETCO2 and breath sounds checked- equal and bilateral Secured at: 21 cm Tube secured with: Tape Dental Injury: Teeth and Oropharynx as per pre-operative assessment  Comments: ETT placed by Lucita Ferrara, SRNA under supervision of MDA and CRNA

## 2021-03-27 NOTE — Op Note (Signed)
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  Pedicle screw placement utilizing the [employer] stereotactic navigation system for placement of bilateral pedicle screws at L3, L4, L5, S1 and placement of aorto iliac screws for pelvic fixation  3.  Posterior lateral arthrodesis L2 to the S1 utilizing locally harvested autograft mixed with vivigen.  Magnifuse and BMP  Surgeon: Dominica Severin Laquia Rosano  Assistant: Ashok Pall  Assistant #2: Nash Shearer  Anesthesia: General  EBL: Minimal  HPI: 71 year old female previously undergone an L4-S1 fusion did fairly well but had multiple extensions of her fusion for herniated disc and instability last operation she had was removal of hardware secondary to loosening of her L4 and L3 screws with known pedicle fracture unilaterally at L4 with a redo in situ fusion.  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  Due to patient progression of clinical syndrome imaging findings failed conservative treatment I recommended reexploration of her lumbar fusion for revision and placement of pedicle screws again from L3-S1 and pelvic fixation utilizing utilizing aortoiliac screws.  I extensively went over the risks and benefits of that operation with her as well as perioperative course expectations of outcome and alternatives of surgery and she understood and agreed to proceed forward.  Operative procedure: Patient was brought into the OR was induced under general anesthesia positioned prone the [employer] table.  Her old lumbar incision was opened up and extended slightly caudally the scar tissue was dissected free and identified the hardware immediately with the  pedicle screws pre-existing at L1-L2 and the inferior aspect the rod that extended below this.  I then exposed the fusion mass from L3 down to the pelvis down the sacral ala.  I exposed the spinous process of T12 and L1 and placed the reference array and shot and intraoperative CT for utilization for the navigation system.  Then after registration was confirmed we utilized a navigation system I placed bilateral S2 AI screws under navigation.  Then utilize navigation to try to identify the previous pedicle screw entry holes at L5 L4 and L3 bilaterally.  Placed screws at all these and all screws excellent purchase.  Postop CT scan showed the left-sided S2 AI screw with a slightly inferior trajectory and the tip of it residing in the inferior aspect of the sacroiliac joint but not crossing the joint of the pelvis.  Patient did not have a lot of additional trajectory options and due to age and osteopenia after counsel with my partner we elected not to replace that screw as I felt like I would more disrupt the sacrum because he just was not a better entry point or trajectory that would not of fallen into the previously placed hole.  So I left that as more of an alar screw the right side S2 AI screw was in excellent position.  In addition the left L3 screw was displaced laterally so I remove that all the other screws appear to be in excellent position.  So then I aggressively decorticated all the facet joints TPs and previous fusion construct and then we worked on contouring the rod.  The entry points made it very difficult for to line up the rod so actually had to remove the S1  screw on the left and the L5 screw on the right and then utilizing an end and connector on the left and and a side connector on the right contoured rods and connected it down to the S2 AI screws.  Everything was anchored in place and tightened down and then LA BMP vivid gin and Magnifuse posterior laterally along the gutters and TPs and facet  joints from L2 down to the sacral ala.  Then I placed a large Hemovac drain and injected Exparel in the fascia and closed the wound in layers with interrupted Vicryl and a running 4 subcuticular Dermabond benzoin Steri-Strips and a sterile dressing was applied patient recovery in stable condition.  At the end of the case all needle counts and sponge counts were correct.

## 2021-03-28 MED ORDER — CEFAZOLIN SODIUM-DEXTROSE 2-4 GM/100ML-% IV SOLN
2.0000 g | Freq: Three times a day (TID) | INTRAVENOUS | Status: AC
  Administered 2021-03-28 – 2021-03-30 (×6): 2 g via INTRAVENOUS
  Filled 2021-03-28 (×6): qty 100

## 2021-03-28 MED ORDER — GABAPENTIN 300 MG PO CAPS
600.0000 mg | ORAL_CAPSULE | Freq: Two times a day (BID) | ORAL | Status: DC
  Administered 2021-03-28: 600 mg via ORAL
  Filled 2021-03-28: qty 2

## 2021-03-28 MED ORDER — LIDOCAINE 5 % EX PTCH
1.0000 | MEDICATED_PATCH | CUTANEOUS | Status: DC
  Administered 2021-03-28 – 2021-04-04 (×7): 1 via TRANSDERMAL
  Filled 2021-03-28 (×8): qty 1

## 2021-03-28 MED ORDER — CHLORHEXIDINE GLUCONATE CLOTH 2 % EX PADS
6.0000 | MEDICATED_PAD | Freq: Every day | CUTANEOUS | Status: DC
  Administered 2021-03-28 – 2021-04-03 (×6): 6 via TOPICAL

## 2021-03-28 MED ORDER — SILVER SULFADIAZINE 1 % EX CREA
TOPICAL_CREAM | CUTANEOUS | Status: DC | PRN
  Filled 2021-03-28: qty 85

## 2021-03-28 MED ORDER — DEXAMETHASONE SODIUM PHOSPHATE 4 MG/ML IJ SOLN
4.0000 mg | Freq: Four times a day (QID) | INTRAMUSCULAR | Status: AC
  Administered 2021-03-28 – 2021-03-29 (×4): 4 mg via INTRAVENOUS
  Filled 2021-03-28 (×4): qty 1

## 2021-03-28 MED ORDER — PANTOPRAZOLE SODIUM 40 MG PO TBEC
40.0000 mg | DELAYED_RELEASE_TABLET | Freq: Every day | ORAL | Status: DC
  Administered 2021-03-28 – 2021-03-31 (×4): 40 mg via ORAL
  Filled 2021-03-28 (×4): qty 1

## 2021-03-28 NOTE — Progress Notes (Signed)
Subjective: Patient reports  overall patient doing okay experiencing pain in her right knee probably from positioning within the joint itself and some changing of the timing of her dosing of her gabapentin.  In addition she is got some clumsiness of her distal right upper extremity at all begins in the elbow down  Objective: Vital signs in last 24 hours: Temp:  [97.6 F (36.4 C)-99.2 F (37.3 C)] 98.9 F (37.2 C) (01/12 0315) Pulse Rate:  [76-103] 103 (01/12 0315) Resp:  [14-22] 20 (01/12 0315) BP: (97-163)/(53-91) 156/77 (01/12 0315) SpO2:  [92 %-99 %] 94 % (01/12 0315)  Intake/Output from previous day: 01/11 0701 - 01/12 0700 In: 2304.8 [P.O.:240; I.V.:1804.8; IV Piggyback:210] Out: 9201 [Urine:1185; Drains:300; Blood:250] Intake/Output this shift: No intake/output data recorded.  Upper extremity strength is 5 out of 5 she has some baseline weakness of the right tricep no real neck pain not radiating at all begins from the elbow down to her hand and seems to follow a radial distribution of the forearm possibly related to the IV that is in her right antecubital.  Left upper strength is fine no symptoms lower extremity strength is 5 out of 5 she has some right knee pain and some swelling around that joint but nothing radicular.  Incision is clean dry and intact  Lab Results: Recent Labs    03/25/21 1452  WBC 8.6  HGB 10.5*  HCT 33.9*  PLT 389   BMET Recent Labs    03/25/21 1452  NA 140  K 4.3  CL 107  CO2 25  GLUCOSE 117*  BUN 18  CREATININE 0.98  CALCIUM 8.3*    Studies/Results: No results found.  Assessment/Plan: 71 year old female status post lumbar spine fusion revision doing fairly well drain output to 50 manageable postoperative labs are stable we will mobilize with physical and Occupational Therapy remove the right antecubital IV get her medication dosing back to as she was taking it at home with the gabapentin twice a day at 8:00 put her on some steroids to  help with the joint swelling.  In addition she is got a little burn from the foam head holder on the undersurface of her chin we will treat that with Silvadene  LOS: 1 day     Elaina Hoops 03/28/2021, 7:51 AM

## 2021-03-28 NOTE — Evaluation (Signed)
Physical Therapy Evaluation Patient Details Name: Angie Olson MRN: [id number] DOB: Dec 05, 1950 Today's Date: 03/28/2021  History of Present Illness  71 yo female s/p reexploration of lumbar wound for exposure for revision of spinal fusion, posterior lateral fusion L2-S1 on 1/11. PMH includes multiple lumbar decompression and fusion operations, anixety, melanoma, HTN, R THA 2018, R knee arthroscopy.  Clinical Impression   Pt presents with generalized weakness, back and RLE pain, RUE incoordination and weakness post-op, min difficulty mobilizing, and decreased activity tolerance vs baseline. Pt to benefit from acute PT to address deficits. Pt ambulated very short room distance, limited from further mobility due to severe RLE pain dynamically which she describes like a cramp. PT to progress mobility as tolerated, and will continue to follow acutely.         Recommendations for follow up therapy are one component of a multi-disciplinary discharge planning process, led by the attending physician.  Recommendations may be updated based on patient status, additional functional criteria and insurance authorization.  Follow Up Recommendations Follow physician's recommendations for discharge plan and follow up therapies (PT recommending HHPT)    Assistance Recommended at Discharge Intermittent Supervision/Assistance  Patient can return home with the following  A little help with walking and/or transfers;A little help with bathing/dressing/bathroom;Assistance with cooking/housework;Direct supervision/assist for medications management;Assist for transportation    Equipment Recommendations Rolling walker (2 wheels)  Recommendations for Other Services       Functional Status Assessment Patient has had a recent decline in their functional status and demonstrates the ability to make significant improvements in function in a reasonable and predictable amount of time.     Precautions / Restrictions  Precautions Precautions: Back Precaution Comments: pt able to recall 3/3 back precautions Restrictions Weight Bearing Restrictions: No      Mobility  Bed Mobility Overal bed mobility: Needs Assistance Bed Mobility: Rolling;Sidelying to Sit Rolling: Min guard Sidelying to sit: Min guard;HOB elevated       General bed mobility comments: cues for log roll technique, use of bedrails and increased time to complete    Transfers Overall transfer level: Needs assistance Equipment used: Rolling walker (2 wheels) Transfers: Sit to/from Stand Sit to Stand: From elevated surface;Min guard           General transfer comment: assist to rise and steady, cues for proper hand placement.    Ambulation/Gait Ambulation/Gait assistance: Min guard Gait Distance (Feet): 8 Feet Assistive device: Rolling walker (2 wheels) Gait Pattern/deviations: Step-through pattern;Decreased stride length;Trunk flexed Gait velocity: decr     General Gait Details: cues for upright posture, placement in RW. Pt very limited in tolerance due to RLE pain  Stairs            Wheelchair Mobility    Modified Rankin (Stroke Patients Only)       Balance Overall balance assessment: Needs assistance Sitting-balance support: No upper extremity supported;Feet supported Sitting balance-Leahy Scale: Fair     Standing balance support: Bilateral upper extremity supported;During functional activity;Reliant on assistive device for balance Standing balance-Leahy Scale: Poor Standing balance comment: reliant on external support                             Pertinent Vitals/Pain Pain Assessment: 0-10 Pain Score: 5  Pain Location: back, R knee Pain Descriptors / Indicators: Sore;Discomfort Pain Intervention(s): Monitored during session;Limited activity within patient's tolerance;Repositioned;Premedicated before session    Home Living Family/patient expects to be discharged to:: Private  residence Living Arrangements: Alone Available Help at Discharge: Family;Friend(s);Available PRN/intermittently Type of Home: House Home Access: Level entry       Home Layout: One level Home Equipment: Rollator (4 wheels);Shower seat - built in;Grab bars - toilet Additional Comments: Pt reports she has a condo here in [place] that she will stay at for one week after discharge, then [place] the [place] of her recovery (she reports she has discussed this with MD)    Prior Function Prior Level of Function : Independent/Modified Independent                     Hand Dominance   Dominant Hand: Right    Extremity/Trunk Assessment   Upper Extremity Assessment Upper Extremity Assessment: Defer to OT evaluation;RUE deficits/detail RUE Coordination: decreased gross motor    Lower Extremity Assessment Lower Extremity Assessment: Generalized weakness;RLE deficits/detail RLE Deficits / Details: complaining of cramping-like pain in RLE dynamically, functionally strength equal to L RLE: Unable to fully assess due to pain RLE Coordination: decreased gross motor    Cervical / Trunk Assessment Cervical / Trunk Assessment: Back Surgery  Communication   Communication: No difficulties  Cognition Arousal/Alertness: Awake/alert Behavior During Therapy: Anxious Overall Cognitive Status: Within Functional Limits for tasks assessed                                 General Comments: anxious, at times has difficulty listening secondary to anxiety so requires repeated cues        General Comments      Exercises     Assessment/Plan    PT Assessment Patient needs continued PT services  PT Problem List Decreased strength;Decreased mobility;Decreased safety awareness;Pain;Decreased balance;Decreased knowledge of use of DME;Decreased activity tolerance;Obesity       PT Treatment Interventions DME instruction;Therapeutic activities;Therapeutic  exercise;Gait training;Patient/family education;Balance training;Stair training;Functional mobility training;Neuromuscular re-education    PT Goals (Current goals can be found in the Care Plan section)  Acute Rehab PT Goals Patient Stated Goal: home PT Goal Formulation: With patient Time For Goal Achievement: 04/11/21 Potential to Achieve Goals: Good    Frequency Min 5X/week     Co-evaluation               AM-PAC PT "6 Clicks" Mobility  Outcome Measure Help needed turning from your back to your side while in a flat bed without using bedrails?: A Little Help needed moving from lying on your back to sitting on the side of a flat bed without using bedrails?: A Little Help needed moving to and from a bed to a chair (including a wheelchair)?: A Little Help needed standing up from a chair using your arms (e.g., wheelchair or bedside chair)?: A Little Help needed to walk in hospital room?: A Lot Help needed climbing 3-5 steps with a railing? : A Lot 6 Click Score: 16    End of Session Equipment Utilized During Treatment: Other (comment) (back brace in room, per orders "No brace needed", pt states it is for her comfort when incision hurts less) Activity Tolerance: Patient tolerated treatment well Patient left: in chair;with call bell/phone within reach;with chair alarm set Nurse Communication: Mobility status PT Visit Diagnosis: Other abnormalities of gait and mobility (R26.89);Difficulty in walking, not elsewhere classified (R26.2);Pain Pain - Right/Left: Right Pain - part of body: Leg;Arm    Time: 0737-1062 PT Time Calculation (min) (ACUTE ONLY): 17 min  Charges:   PT Evaluation $PT Eval Low Complexity: 1 Low         Kymberlee Viger S, PT DPT Acute Rehabilitation Services Pager [phone]  Office [phone]  [name] 03/28/2021, 10:36 AM

## 2021-03-28 NOTE — Progress Notes (Signed)
RN went to remove foley per orders. Patient refused for foley to be removed. Patient stated that she wont be able to get up to urinate due to knee and back pain. Rn offered purewick set up/or bedpan pt stated that she would not be able to urinated in the bed and that she would like to keep foley in place until she had seen the MD or has worked with physical therapy. Pt was educated on the risks of leaving in a foley catheter, pt still agreed to leave foley in.  RN left foley in for now and will pass this on to oncoming nurse.

## 2021-03-28 NOTE — Evaluation (Signed)
Occupational Therapy Evaluation Patient Details Name: Angie Olson MRN: [id number] DOB: [birth year] Today's Date: 03/28/2021   History of Present Illness 71 yo female s/p reexploration of lumbar wound for exposure for revision of spinal fusion, posterior lateral fusion L2-S1 on 1/11. PMH includes multiple lumbar decompression and fusion operations, anixety, melanoma, HTN, R THA 2018, R knee arthroscopy.   Clinical Impression   Patient admitted for the above procedure.  Pain to low back and radicular pain to R leg are the barriers.  Once her pain is better managed, home with DME is appropriate.  OT to follow in the acute setting as pain is the main cause for assist that she needs.  Once her pain is better managed, she should progress quickly.  Currently needing up to [place] for basic mobility and lower body ADL.       Recommendations for follow up therapy are one component of a multi-disciplinary discharge planning process, led by the attending physician.  Recommendations may be updated based on patient status, additional functional criteria and insurance authorization.   Follow Up Recommendations  Follow physician's recommendations for discharge plan and follow up therapies    Assistance Recommended at Discharge Intermittent Supervision/Assistance  Patient can return home with the following Assistance with cooking/housework    Functional Status Assessment  Patient has had a recent decline in their functional status and demonstrates the ability to make significant improvements in function in a reasonable and predictable amount of time.  Equipment Recommendations  BSC/3in1    Recommendations for Other Services       Precautions / Restrictions Precautions Precautions: Back Precaution Comments: pt able to recall 3/3 back precautions Restrictions Weight Bearing Restrictions: No      Mobility Bed Mobility Overal bed mobility: Needs Assistance Bed Mobility: Sit to  Sidelying Rolling: Min guard Sidelying to sit: Min guard;HOB elevated     Sit to sidelying: Min assist General bed mobility comments: cues for log roll technique, use of bedrails and increased time to complete    Transfers Overall transfer level: Needs assistance Equipment used: Rolling walker (2 wheels) Transfers: Sit to/from Stand;Bed to chair/wheelchair/BSC Sit to Stand: Min assist     Step pivot transfers: Min assist     General transfer comment: assist to rise and steady, cues for proper hand placement.      Balance Overall balance assessment: Needs assistance Sitting-balance support: No upper extremity supported;Feet supported Sitting balance-Leahy Scale: Fair     Standing balance support: Bilateral upper extremity supported;During functional activity;Reliant on assistive device for balance Standing balance-Leahy Scale: Poor Standing balance comment: reliant on external support                           ADL either performed or assessed with clinical judgement   ADL       Grooming: Wash/dry hands;Wash/dry face;Set up;Sitting           Upper Body Dressing : Set up;Sitting   Lower Body Dressing: Moderate assistance;Sit to/from stand   Toilet Transfer: Minimal Teacher, English as a foreign language;Ambulation;Rolling walker (2 wheels)                   Vision Baseline Vision/History: 1 Wears glasses Patient Visual Report: No change from baseline       Perception Perception Perception: Not tested   Praxis Praxis Praxis: Not tested    Pertinent Vitals/Pain Pain Assessment: Faces Pain Score: 5  Faces Pain Scale: Hurts whole lot Pain Location:  back, R knee Pain Descriptors / Indicators: Grimacing;Guarding;Shooting;Sharp;Crying;Numbness Pain Intervention(s): Monitored during session     Hand Dominance Right   Extremity/Trunk Assessment Upper Extremity Assessment Upper Extremity Assessment: RUE deficits/detail RUE Deficits / Details:  weakness noted to R bicep/forearm.  Patient had IV that may have touched a nn. RUE Sensation: decreased light touch RUE Coordination: decreased gross motor   Lower Extremity Assessment Lower Extremity Assessment: Defer to PT evaluation RLE Deficits / Details: complaining of cramping-like pain in RLE dynamically, functionally strength equal to L RLE: Unable to fully assess due to pain RLE Coordination: decreased gross motor   Cervical / Trunk Assessment Cervical / Trunk Assessment: Back Surgery   Communication Communication Communication: No difficulties   Cognition Arousal/Alertness: Awake/alert Behavior During Therapy: Anxious Overall Cognitive Status: Within Functional Limits for tasks assessed                                 General Comments: anxious, at times has difficulty listening secondary to anxiety so requires repeated cues                      Home Living Family/patient expects to be discharged to:: Private residence Living Arrangements: Alone Available Help at Discharge: Family;Friend(s);Available PRN/intermittently Type of Home: House Home Access: Level entry     Home Layout: One level     Bathroom Shower/Tub: Occupational psychologist: Standard Bathroom Accessibility: Yes How Accessible: Accessible via wheelchair Home Equipment: Rollator (4 wheels);Shower seat - built in;Grab bars - toilet   Additional Comments: Pt reports she has a condo here in [place] that she will stay at for one week after discharge, then [place] the [place] of her recovery (she reports she has discussed this with MD)      Prior Functioning/Environment Prior Level of Function : Independent/Modified Independent                        OT Problem List: Decreased strength;Decreased activity tolerance;Impaired balance (sitting and/or standing);Decreased knowledge of precautions;Decreased knowledge of use of DME or AE;Pain       OT Treatment/Interventions: Self-care/ADL training;Therapeutic exercise;Therapeutic activities;Balance training;Patient/family education    OT Goals(Current goals can be found in the care plan section) Acute Rehab OT Goals Patient Stated Goal: stop this leg from hurting OT Goal Formulation: With patient Time For Goal Achievement: 04/11/21 ADL Goals Pt Will Perform Grooming: with modified independence;standing Pt Will Perform Lower Body Dressing: with modified independence;sit to/from stand;with adaptive equipment Pt Will Transfer to Toilet: with modified independence;regular height toilet;ambulating  OT Frequency: Min 2X/week    Co-evaluation              AM-PAC OT "6 Clicks" Daily Activity     Outcome Measure Help from another person eating meals?: None Help from another person taking care of personal grooming?: None Help from another person toileting, which includes using toliet, bedpan, or urinal?: A Little Help from another person bathing (including washing, rinsing, drying)?: A Little Help from another person to put on and taking off regular upper body clothing?: A Little Help from another person to put on and taking off regular lower body clothing?: A Little 6 Click Score: 20   End of Session Equipment Utilized During Treatment: Rolling walker (2 wheels) Nurse Communication: Mobility status  Activity Tolerance: Patient limited by pain Patient left: in bed;with  call bell/phone within reach  OT Visit Diagnosis: Unsteadiness on feet (R26.81);Pain Pain - Right/Left: Right Pain - part of body: Leg                Time: 1053-1107 OT Time Calculation (min): 14 min Charges:  OT General Charges $OT Visit: 1 Visit OT Evaluation $OT Eval Moderate Complexity: 1 Mod  03/28/2021  03/28/2021  RP, OTR/L  Acute Rehabilitation Services  Office:  [phone]  Metta Clines 03/28/2021, 11:14 AM

## 2021-03-28 NOTE — Anesthesia Postprocedure Evaluation (Signed)
Anesthesia Post Note  Patient: Angie Olson  Procedure(s) Performed: Posterior lateral fusion - Lumbar two-Lumbar three - Lumbar three-Lumbar four - Lumbar four-Lumbar five - Lumbar five-Sacral one - Pelvis with navigation [place] (Back) APPLICATION OF INTRAOPERATIVE CT SCAN     Patient location during evaluation: PACU Anesthesia Type: General Level of consciousness: sedated and patient cooperative Pain management: pain level controlled Vital Signs Assessment: post-procedure vital signs reviewed and stable Respiratory status: spontaneous breathing Cardiovascular status: stable Anesthetic complications: no   No notable events documented.  Last Vitals:  Vitals:   03/28/21 0315 03/28/21 0751  BP: (!) 156/77   Pulse: (!) 103 90  Resp: 20 20  Temp: 37.2 C 36.9 C  SpO2: 94% 94%    Last Pain:  Vitals:   03/28/21 0751  TempSrc:   PainSc: [place]

## 2021-03-29 MED ORDER — GABAPENTIN 300 MG PO CAPS
600.0000 mg | ORAL_CAPSULE | Freq: Three times a day (TID) | ORAL | Status: DC
  Administered 2021-03-29 – 2021-03-30 (×4): 600 mg via ORAL
  Filled 2021-03-29 (×4): qty 2

## 2021-03-29 MED ORDER — TAMSULOSIN HCL 0.4 MG PO CAPS
0.4000 mg | ORAL_CAPSULE | Freq: Every day | ORAL | Status: DC
  Administered 2021-03-29 – 2021-04-04 (×6): 0.4 mg via ORAL
  Filled 2021-03-29 (×7): qty 1

## 2021-03-29 MED ORDER — OXYCODONE HCL ER 15 MG PO T12A
15.0000 mg | EXTENDED_RELEASE_TABLET | Freq: Two times a day (BID) | ORAL | Status: DC
  Administered 2021-03-29 – 2021-04-04 (×12): 15 mg via ORAL
  Filled 2021-03-29 (×12): qty 1

## 2021-03-29 NOTE — Care Management Important Message (Signed)
Important Message  Patient Details  Name: Angie Olson MRN: [id number] Date of Birth: Aug 02, 1950   Medicare Important Message Given:  Yes     Hannah Beat 03/29/2021, 3:00 PM

## 2021-03-29 NOTE — Progress Notes (Signed)
Patient ID: Angie Olson, female   DOB: 1950-11-10, 71 y.o.   MRN: [id number] BP 128/64 (BP Location: Right Arm)    Pulse 91    Temp 97.9 F (36.6 C) (Oral)    Resp 18    Ht 5\' 4"  (1.626 m)    Wt 87.5 kg    SpO2 96%    BMI 33.13 kg/m  Alert and oriented x 4, speech is clear and fluent Moving lower extremities well In substantial pain, will start scheduled oxycontin, in addition to the dilaudid, and oxycodone.  Wound is clean, dry, and without signs of infection Will discontinue foley catheter. Starting flomax.

## 2021-03-29 NOTE — TOC Initial Note (Signed)
Transition of Care [facility]) - Initial/Assessment Note    Patient Details  Name: LENORE MOYANO MRN: [id number] Date of Birth: 30-Aug-1950  Transition of Care [facility]) CM/SW Contact:    Angelita Ingles, RN Phone Number:[phone]  03/29/2021, 2:25 PM  Clinical Narrative:                 [facility] consulted for patient with home health and DME needs. Patient currently is refusing home health. Patient states that she has has 12 previous surgeries and that she does not want home health. Patient would like to go back to her condo initially and when she goes for her follow up visit and allowed to drive she states that she will request outpatient therapy at that time. Patient states that she has done this in the past and it works well for her. Patient states that she does not need any DME. Per patient she has walker, shower chairs, & BSC at home. Patient has a condo here in [place] where she will go upon d/c and then when she is better she will go to her main home in [place]. Patient states that she uses [facility] on [address] in [place] and follows up regularly with her PCP [facility]. Currently there are no TOC needs. TOC will sign off. Please send new consult if needed.   Expected Discharge Plan: Home/Self Care (Refuses Home health) Barriers to Discharge: Continued Medical Work up   Patient Goals and CMS Choice Patient states their goals for this hospitalization and ongoing recovery are:: Wants to get better to go back to [facility].gov Compare Post Acute Care list provided to::  (Patient resudes doesnt want home health) Choice offered to / list presented to :  (refused)  Expected Discharge Plan and Services Expected Discharge Plan: Home/Self Care (Refuses Home health) In-house Referral: NA Discharge Planning Services: CM Consult Post Acute Care Choice: NA Living arrangements for the past 2 months: Single Family Home                 DME Arranged: N/A (refused) DME Agency:  NA       HH Arranged: Refused [place] Agency: NA        Prior Living Arrangements/Services Living arrangements for the past 2 months: Single Family Home Lives with:: Self Patient language and need for interpreter reviewed:: Yes Do you feel safe going back to the place where you live?: Yes      Need for Family Participation in Patient Care: Yes (Comment) Care giver support system in place?: Yes (comment) Current home services:  (n/a) Criminal Activity/Legal Involvement Pertinent to Current Situation/Hospitalization: No - Comment as needed  Activities of Daily Living      Permission Sought/Granted   Permission granted to share information with : No              Emotional Assessment Appearance:: Appears stated age Attitude/Demeanor/Rapport: Gracious Affect (typically observed): Pleasant Orientation: : Oriented to Self, Oriented to Place, Oriented to  Time, Oriented to Situation Alcohol / Substance Use: Not Applicable Psych Involvement: No (comment)  Admission diagnosis:  Pseudoarthrosis of lumbar spine [S32.009K] Patient Active Problem List   Diagnosis Date Noted   Failed total knee arthroplasty, 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   Failed total knee arthroplasty ([place]) 05/28/2016   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   OA (osteoarthritis) of hip 04/25/2015   Colitis 07/19/2014   Lactic acidosis 07/19/2014   SIRS (systemic inflammatory response syndrome) ([place]) 07/19/2014   Hypotension arterial  07/18/2014   Hypovolemia dehydration 07/18/2014   AKI (acute kidney injury) ([place]) 07/18/2014   Chronic pain 07/18/2014   Hypotension (arterial) 07/18/2014   Lumbar pseudoarthrosis 07/05/2014   Epidural hematoma 01/11/2014   HNP (herniated nucleus pulposus), lumbar 12/05/2013   OA (osteoarthritis) of knee 07/25/2013   DOE (dyspnea on exertion) 06/23/2013   HTN (hypertension) 06/23/2013   Acute medial meniscal tear 10/13/2012   SINUSITIS 12/24/2007   PULMONARY NODULE 12/24/2007    COUGH 12/24/2007   COLONIC POLYPS, ADENOMATOUS, HX OF 10/19/2007   ALLERGIC RHINITIS 12/07/2006   GERD 12/07/2006   PCP:  Velna Hatchet, MD Pharmacy:   [facility], [address] [place] [address] Phone: [phone] Fax: [phone]     Social Determinants of Health ([place]) Interventions    Readmission Risk Interventions Readmission Risk Prevention Plan 01/30/2020  Transportation Screening Complete  PCP or Specialist Appt within 5-7 Days Complete  Home Care Screening Complete  Medication Review (RN CM) Complete  Some recent data might be hidden

## 2021-03-29 NOTE — Progress Notes (Signed)
Physical Therapy Treatment Patient Details Name: Angie Olson MRN: [id number] DOB: [birth year] Today's Date: 03/29/2021   History of Present Illness 71 yo female s/p reexploration of lumbar wound for exposure for revision of spinal fusion, posterior lateral fusion L2-S1 on 1/11. PMH includes multiple lumbar decompression and fusion operations, anixety, melanoma, HTN, R THA 2018, R knee arthroscopy.    PT Comments    Pt progressed to short hallway ambulation with use of RW and cues for form/safety, pt complaining of severe R knee and calf pain during mobility. RN notified that pt requesting pain meds, PT to continue to follow acutely.     Recommendations for follow up therapy are one component of a multi-disciplinary discharge planning process, led by the attending physician.  Recommendations may be updated based on patient status, additional functional criteria and insurance authorization.  Follow Up Recommendations  Follow physician's recommendations for discharge plan and follow up therapies (HHPT, assist from sister)     Assistance Recommended at Discharge Intermittent Supervision/Assistance  Patient can return home with the following A little help with walking and/or transfers;A little help with bathing/dressing/bathroom;Assistance with cooking/housework;Direct supervision/assist for medications management;Assist for transportation   Equipment Recommendations  Rolling walker (2 wheels)    Recommendations for Other Services       Precautions / Restrictions Precautions Precautions: Back Precaution Comments: pt able to recall 3/3 back precautions Restrictions Weight Bearing Restrictions: No     Mobility  Bed Mobility Overal bed mobility: Needs Assistance Bed Mobility: Rolling;Sidelying to Sit;Sit to Sidelying Rolling: Supervision Sidelying to sit: Supervision     Sit to sidelying: Supervision General bed mobility comments: for safety, icnreased time and use of  bedrail    Transfers Overall transfer level: Needs assistance Equipment used: Rolling walker (2 wheels) Transfers: Sit to/from Stand Sit to Stand: Min guard           General transfer comment: close guard for safety, increased time to rise due to pain    Ambulation/Gait Ambulation/Gait assistance: Supervision Gait Distance (Feet): 65 Feet Assistive device: Rolling walker (2 wheels) Gait Pattern/deviations: Step-through pattern;Decreased stride length;Trunk flexed Gait velocity: decr     General Gait Details: cues for upright posture, proximity to [employer] Mobility    Modified Rankin (Stroke Patients Only)       Balance Overall balance assessment: Needs assistance Sitting-balance support: No upper extremity supported;Feet supported Sitting balance-Leahy Scale: Fair     Standing balance support: Bilateral upper extremity supported;During functional activity;Reliant on assistive device for balance Standing balance-Leahy Scale: Poor                              Cognition Arousal/Alertness: Awake/alert Behavior During Therapy: Anxious Overall Cognitive Status: Within Functional Limits for tasks assessed                                          Exercises      General Comments        Pertinent Vitals/Pain Pain Assessment: Faces Faces Pain Scale: Hurts whole lot Pain Location: back, R knee and calf Pain Descriptors / Indicators: Grimacing;Guarding;Shooting;Sharp;Crying;Numbness Pain Intervention(s): Limited activity within patient's tolerance;Monitored during session;Repositioned    Home Living  Prior Function            PT Goals (current goals can now be found in the care plan section) Acute Rehab PT Goals Patient Stated Goal: home PT Goal Formulation: With patient Time For Goal Achievement: 04/11/21 Potential to Achieve Goals: Good Progress towards  PT goals: Progressing toward goals    Frequency    Min 5X/week      PT Plan Current plan remains appropriate    Co-evaluation              AM-PAC PT "6 Clicks" Mobility   Outcome Measure  Help needed turning from your back to your side while in a flat bed without using bedrails?: A Little Help needed moving from lying on your back to sitting on the side of a flat bed without using bedrails?: A Little Help needed moving to and from a bed to a chair (including a wheelchair)?: A Little Help needed standing up from a chair using your arms (e.g., wheelchair or bedside chair)?: A Little Help needed to walk in hospital room?: A Little Help needed climbing 3-5 steps with a railing? : A Lot 6 Click Score: 17    End of Session   Activity Tolerance: Patient limited by pain Patient left: with call bell/phone within reach;in bed;with bed alarm set Nurse Communication: Mobility status;Patient requests pain meds PT Visit Diagnosis: Other abnormalities of gait and mobility (R26.89);Difficulty in walking, not elsewhere classified (R26.2);Pain Pain - Right/Left: Right Pain - part of body: Leg     Time: 4801-6553 PT Time Calculation (min) (ACUTE ONLY): 14 min  Charges:  $Gait Training: 8-22 mins                    Stacie Glaze, PT DPT Acute Rehabilitation Services Pager [phone]  Office [phone]   [place] 03/29/2021, 4:42 PM

## 2021-03-29 NOTE — Progress Notes (Signed)
Occupational Therapy Treatment Patient Details Name: Angie Olson MRN: [id number] DOB: September 18, 1950 Today's Date: 03/29/2021   History of present illness 71 yo female s/p reexploration of lumbar wound for exposure for revision of spinal fusion, posterior lateral fusion L2-S1 on 1/11. PMH includes multiple lumbar decompression and fusion operations, anixety, melanoma, HTN, R THA 2018, R knee arthroscopy.   OT comments  Patient with fair progress toward patient focused goals.  Radicular pain to R leg remains the primary deficit impacting safety and balance.  Her sister will be stopping by daily to assist as needed with IADL and ADL.  Patient is currently needing generalized Min A for all mobility and ADL completion.  OT will follow in the acute setting, but home with assist from sister appears to be the plan.     Recommendations for follow up therapy are one component of a multi-disciplinary discharge planning process, led by the attending physician.  Recommendations may be updated based on patient status, additional functional criteria and insurance authorization.    Follow Up Recommendations  Follow physician's recommendations for discharge plan and follow up therapies    Assistance Recommended at Discharge Intermittent Supervision/Assistance  Patient can return home with the following  Assistance with cooking/housework;Assist for transportation;A little help with bathing/dressing/bathroom;A little help with walking and/or transfers   Equipment Recommendations  BSC/3in1    Recommendations for Other Services      Precautions / Restrictions Precautions Precautions: Back Restrictions Weight Bearing Restrictions: No       Mobility Bed Mobility Overal bed mobility: Needs Assistance Bed Mobility: Sit to Sidelying   Sidelying to sit: Supervision            Transfers Overall transfer level: Needs assistance Equipment used: Rolling walker (2 wheels) Transfers: Sit to/from  Stand Sit to Stand: Min guard                 Balance Overall balance assessment: Needs assistance Sitting-balance support: No upper extremity supported;Feet supported Sitting balance-Leahy Scale: Fair     Standing balance support: Bilateral upper extremity supported;During functional activity;Reliant on assistive device for balance Standing balance-Leahy Scale: Poor                             ADL either performed or assessed with clinical judgement   ADL       Grooming: Wash/dry hands;Wash/dry face;Oral care;Supervision/safety;Standing               Lower Body Dressing: Sit to/from stand;Minimal assistance   Toilet Transfer: Regular Toilet;Ambulation;Rolling walker (2 wheels);Min guard                  Extremity/Trunk Assessment Upper Extremity Assessment RUE Deficits / Details: weakness noted to R bicep/forearm.  Good grip. RUE Sensation: decreased light touch RUE Coordination: decreased gross motor   Lower Extremity Assessment Lower Extremity Assessment: Defer to PT evaluation        Vision Baseline Vision/History: 1 Wears glasses Patient Visual Report: No change from baseline     Perception Perception Perception: Within Functional Limits   Praxis Praxis Praxis: Intact    Cognition Arousal/Alertness: Awake/alert Behavior During Therapy: Anxious Overall Cognitive Status: Within Functional Limits for tasks assessed  Exercises     Shoulder Instructions       General Comments  VVS on RA    Pertinent Vitals/ Pain       Faces Pain Scale: Hurts whole lot Pain Location: back, R knee Pain Descriptors / Indicators: Grimacing;Guarding;Shooting;Sharp;Crying;Numbness Pain Intervention(s): Monitored during session;Premedicated before session                                                          Frequency  Min 2X/week        Progress  Toward Goals  OT Goals(current goals can now be found in the care plan section)  Progress towards OT goals: Progressing toward goals  Acute Rehab OT Goals Patient Stated Goal: Stay comfortable OT Goal Formulation: With patient Time For Goal Achievement: 04/11/21  Plan Discharge plan remains appropriate    Co-evaluation                 AM-PAC OT "6 Clicks" Daily Activity     Outcome Measure   Help from another person eating meals?: None Help from another person taking care of personal grooming?: None Help from another person toileting, which includes using toliet, bedpan, or urinal?: A Little Help from another person bathing (including washing, rinsing, drying)?: A Little Help from another person to put on and taking off regular upper body clothing?: A Little Help from another person to put on and taking off regular lower body clothing?: A Little 6 Click Score: 20    End of Session Equipment Utilized During Treatment: Rolling walker (2 wheels)  OT Visit Diagnosis: Unsteadiness on feet (R26.81);Pain Pain - Right/Left: Right Pain - part of body: Leg   Activity Tolerance Patient limited by pain   Patient Left in chair;with call bell/phone within reach;with chair alarm set;with family/visitor present   Nurse Communication          Time: 8280-0349 OT Time Calculation (min): 18 min  Charges: OT General Charges $OT Visit: 1 Visit OT Treatments $Self Care/Home Management : 8-22 mins  03/29/2021  RP, OTR/L  Acute Rehabilitation Services  Office:  [phone]   Metta Clines 03/29/2021, 10:32 AM

## 2021-03-30 ENCOUNTER — Inpatient Hospital Stay (HOSPITAL_COMMUNITY): Payer: Medicare Other

## 2021-03-30 MED ORDER — GABAPENTIN 300 MG PO CAPS
900.0000 mg | ORAL_CAPSULE | Freq: Three times a day (TID) | ORAL | Status: DC
  Administered 2021-03-30 – 2021-04-04 (×14): 900 mg via ORAL
  Filled 2021-03-30 (×14): qty 3

## 2021-03-30 NOTE — Progress Notes (Signed)
Physical Therapy Treatment Patient Details Name: Angie Olson MRN: [id number] DOB: [birth year] Today's Date: 03/30/2021   History of Present Illness 71 yo female s/p reexploration of lumbar wound for exposure for revision of spinal fusion, posterior lateral fusion L2-S1 on 1/11. PMH includes multiple lumbar decompression and fusion operations, anixety, melanoma, HTN, R THA 2018, R knee arthroscopy.    PT Comments    Pt is making slow progress towards goals as she is currently limited by pain. Pt agreeable to participate in therapy, however upon rising to EOB, pt reported significant pain radiating down her R LE. She hurriedly progressed to the bathroom for toileting with cues to slow for safety. Pt very anxious with increased respiratory rate. Cues to slow breathing and calm. She was able to progress back to recliner chair where she agreed to sit up for as long as she could tolerate. Will continue to follow acutely for mobility progression.     Recommendations for follow up therapy are one component of a multi-disciplinary discharge planning process, led by the attending physician.  Recommendations may be updated based on patient status, additional functional criteria and insurance authorization.  Follow Up Recommendations  Follow physician's recommendations for discharge plan and follow up therapies (HHPT, assist from sister)     Assistance Recommended at Discharge Intermittent Supervision/Assistance  Patient can return home with the following A little help with walking and/or transfers;A little help with bathing/dressing/bathroom;Assistance with cooking/housework;Direct supervision/assist for medications management;Assist for transportation   Equipment Recommendations  Rolling walker (2 wheels)    Recommendations for Other Services       Precautions / Restrictions Precautions Precautions: Back Precaution Comments: pt able to recall 3/3 back precautions     Mobility  Bed  Mobility Overal bed mobility: Needs Assistance Bed Mobility: Rolling;Sidelying to Sit Rolling: Supervision Sidelying to sit: Supervision       General bed mobility comments: good technique from previous back surgeries. Use of bedrail and increased time.    Transfers Overall transfer level: Needs assistance Equipment used: Rolling walker (2 wheels) Transfers: Sit to/from Stand Sit to Stand: Min guard           General transfer comment: close guard for safety, increased time to rise due to pain    Ambulation/Gait Ambulation/Gait assistance: Min guard Gait Distance (Feet): 40 Feet (20x2) Assistive device: Rolling walker (2 wheels) Gait Pattern/deviations: Step-through pattern;Decreased stride length;Trunk flexed Gait velocity: decr     General Gait Details: cues to slow down as pt became very hurried and anxious due to pain.   Stairs             Wheelchair Mobility    Modified Rankin (Stroke Patients Only)       Balance Overall balance assessment: Needs assistance Sitting-balance support: No upper extremity supported;Feet supported Sitting balance-Leahy Scale: Fair     Standing balance support: Bilateral upper extremity supported;During functional activity;Reliant on assistive device for balance Standing balance-Leahy Scale: Poor Standing balance comment: reliant on external support                            Cognition Arousal/Alertness: Awake/alert Behavior During Therapy: Anxious Overall Cognitive Status: Within Functional Limits for tasks assessed                                 General Comments: anxious, at times has difficulty listening secondary to anxiety so  requires repeated cues        Exercises      General Comments        Pertinent Vitals/Pain Pain Assessment: Faces Faces Pain Scale: Hurts whole lot Pain Location: back, R knee and calf Pain Descriptors / Indicators:  Grimacing;Guarding;Shooting;Sharp;Crying;Numbness Pain Intervention(s): Monitored during session;Limited activity within patient's tolerance    Home Living                          Prior Function            PT Goals (current goals can now be found in the care plan section) Acute Rehab PT Goals Patient Stated Goal: home PT Goal Formulation: With patient Time For Goal Achievement: 04/11/21 Potential to Achieve Goals: Good Progress towards PT goals: Progressing toward goals    Frequency    Min 5X/week      PT Plan Current plan remains appropriate    Co-evaluation              AM-PAC PT "6 Clicks" Mobility   Outcome Measure  Help needed turning from your back to your side while in a flat bed without using bedrails?: A Little Help needed moving from lying on your back to sitting on the side of a flat bed without using bedrails?: A Little Help needed moving to and from a bed to a chair (including a wheelchair)?: A Little Help needed standing up from a chair using your arms (e.g., wheelchair or bedside chair)?: A Little Help needed to walk in hospital room?: A Little Help needed climbing 3-5 steps with a railing? : A Lot 6 Click Score: 17    End of Session Equipment Utilized During Treatment: Other (comment) (back brace in room, per orders "No brace needed", pt states it is for her comfort when incision hurts less) Activity Tolerance: Patient limited by pain Patient left: with call bell/phone within reach;in chair;with chair alarm set Nurse Communication: Mobility status;Patient requests pain meds PT Visit Diagnosis: Other abnormalities of gait and mobility (R26.89);Difficulty in walking, not elsewhere classified (R26.2);Pain Pain - Right/Left: Right Pain - part of body: Leg     Time: 7680-8811 PT Time Calculation (min) (ACUTE ONLY): 20 min  Charges:  $Gait Training: 8-22 mins                     Benjiman Core, [place] Pager [phone] Acute  Rehab  Allena Katz 03/30/2021, 3:30 PM

## 2021-03-30 NOTE — Progress Notes (Signed)
°  NEUROSURGERY PROGRESS NOTE   No issues overnight. Pt c/o severe right leg pain extending from right buttock through thigh, knee, calf, into ankle and foot. Exacerbated when bearing weight. She appears to be in moderate distress. She reports this is new since surgery, worse overnight.  EXAM:  BP (!) 123/99 (BP Location: Right Arm)    Pulse 83    Temp 98.1 F (36.7 C) (Oral)    Resp 16    Ht 5\' 4"  (1.626 m)    Wt 87.5 kg    SpO2 93%    BMI 33.13 kg/m   Awake, alert, oriented  Speech fluent, appropriate  CN grossly intact  Good strength BLE including 5/5 knee ext, PF/DF, EHL  IMPRESSION:  71 y.o. female POD# 3 s/p L2-S1 instrumented stabilization/fusion for pseudoarthrosis. Pt c/o continued significant right leg pain, subjectively new.  PLAN: - Can increase gabapentin to 900mg  TID - Already on Pamelor, IV dilaudid and oxycodone. - Will order CT L-spine without   Consuella Lose, MD [facility]

## 2021-03-31 MED ORDER — POLYETHYLENE GLYCOL 3350 17 G PO PACK
17.0000 g | PACK | Freq: Every day | ORAL | Status: DC
  Administered 2021-03-31 – 2021-04-04 (×4): 17 g via ORAL
  Filled 2021-03-31 (×5): qty 1

## 2021-03-31 MED ORDER — DOCUSATE SODIUM 50 MG PO CAPS
50.0000 mg | ORAL_CAPSULE | Freq: Two times a day (BID) | ORAL | Status: DC
  Administered 2021-03-31: 50 mg via ORAL
  Filled 2021-03-31 (×2): qty 1

## 2021-03-31 NOTE — Progress Notes (Signed)
RN rechecked vital signs due to low map previously recorded

## 2021-03-31 NOTE — Progress Notes (Signed)
°  NEUROSURGERY PROGRESS NOTE   No issues overnight. Cont to c/o severe right leg/calf/foot pain. Minimal improvement with increased gabapentin dose  EXAM:  BP 139/65 (BP Location: Right Arm)    Pulse 89    Temp 98.3 F (36.8 C) (Oral)    Resp 20    Ht 5\' 4"  (1.626 m)    Wt 87.5 kg    SpO2 94%    BMI 33.13 kg/m   Awake, alert, oriented  Speech fluent, appropriate  CN grossly intact  5/5 proximal BLE, PF/DF/EHL  IMAGING: CT L-spine reviewed. Extensive degenerative/spondylosis throughout the L-spine. Right S1 pedicle screws appears extrapedicular   IMPRESSION:  71 y.o. female s/p revision lumbosacral fusion, cont to c/o new right leg pain.  PLAN: - Cont current pain regimen - pt already on oxycontin, gabapentin, pamelor, steroids - Will speak with Dr. Saintclair Halsted to review CT   Consuella Lose, MD [facility]

## 2021-03-31 NOTE — Plan of Care (Signed)

## 2021-04-01 ENCOUNTER — Inpatient Hospital Stay (HOSPITAL_COMMUNITY): Payer: Medicare Other | Admitting: Anesthesiology

## 2021-04-01 ENCOUNTER — Inpatient Hospital Stay (HOSPITAL_COMMUNITY): Payer: Medicare Other

## 2021-04-01 ENCOUNTER — Encounter (HOSPITAL_COMMUNITY): Payer: Self-pay | Admitting: Neurosurgery

## 2021-04-01 ENCOUNTER — Encounter (HOSPITAL_COMMUNITY)
Admission: RE | Disposition: A | Discharge status: Home / Self Care | Payer: Self-pay | Source: Home / Self Care | Attending: Neurosurgery

## 2021-04-01 DIAGNOSIS — T8484XA Pain due to internal orthopedic prosthetic devices, implants and grafts, initial encounter: Secondary | ICD-10-CM | POA: Diagnosis present

## 2021-04-01 HISTORY — PX: LAMINECTOMY WITH POSTERIOR LATERAL ARTHRODESIS LEVEL 1: SHX6335

## 2021-04-01 SURGERY — LAMINECTOMY WITH POSTERIOR LATERAL ARTHRODESIS LEVEL 1
Anesthesia: General | Site: Back

## 2021-04-01 MED ORDER — SODIUM CHLORIDE 0.9 % IV SOLN
250.0000 mL | INTRAVENOUS | Status: DC
  Administered 2021-04-01: 250 mL via INTRAVENOUS

## 2021-04-01 MED ORDER — LIDOCAINE-EPINEPHRINE 1 %-1:100000 IJ SOLN
INTRAMUSCULAR | Status: AC
  Filled 2021-04-01: qty 1

## 2021-04-01 MED ORDER — ROCURONIUM BROMIDE 10 MG/ML (PF) SYRINGE
PREFILLED_SYRINGE | INTRAVENOUS | Status: AC
  Filled 2021-04-01: qty 10

## 2021-04-01 MED ORDER — CEFAZOLIN SODIUM-DEXTROSE 2-3 GM-%(50ML) IV SOLR: INTRAVENOUS | Status: DC | PRN

## 2021-04-01 MED ORDER — LIDOCAINE 2% (20 MG/ML) 5 ML SYRINGE
INTRAMUSCULAR | Status: DC | PRN
  Administered 2021-04-01: 60 mg via INTRAVENOUS

## 2021-04-01 MED ORDER — CEFAZOLIN SODIUM-DEXTROSE 2-4 GM/100ML-% IV SOLN
2.0000 g | Freq: Three times a day (TID) | INTRAVENOUS | Status: AC
  Administered 2021-04-01 – 2021-04-03 (×6): 2 g via INTRAVENOUS
  Filled 2021-04-01 (×6): qty 100

## 2021-04-01 MED ORDER — ORAL CARE MOUTH RINSE: 15.0000 mL | Freq: Once | OROMUCOSAL | Status: AC

## 2021-04-01 MED ORDER — ONDANSETRON HCL 4 MG PO TABS: 4.0000 mg | ORAL_TABLET | Freq: Four times a day (QID) | ORAL | Status: DC | PRN

## 2021-04-01 MED ORDER — PHENOL 1.4 % MT LIQD: 1.0000 | OROMUCOSAL | Status: DC | PRN

## 2021-04-01 MED ORDER — FENTANYL CITRATE (PF) 250 MCG/5ML IJ SOLN
INTRAMUSCULAR | Status: DC | PRN
  Administered 2021-04-01: 50 ug via INTRAVENOUS
  Administered 2021-04-01: 100 ug via INTRAVENOUS
  Administered 2021-04-01 (×2): 50 ug via INTRAVENOUS

## 2021-04-01 MED ORDER — FENTANYL CITRATE (PF) 250 MCG/5ML IJ SOLN
INTRAMUSCULAR | Status: AC
  Filled 2021-04-01: qty 5

## 2021-04-01 MED ORDER — PHENYLEPHRINE HCL-NACL 20-0.9 MG/250ML-% IV SOLN
INTRAVENOUS | Status: DC | PRN
  Administered 2021-04-01: 60 ug/min via INTRAVENOUS

## 2021-04-01 MED ORDER — MIDAZOLAM HCL 2 MG/2ML IJ SOLN
INTRAMUSCULAR | Status: DC | PRN
  Administered 2021-04-01: 2 mg via INTRAVENOUS

## 2021-04-01 MED ORDER — SUCCINYLCHOLINE CHLORIDE 200 MG/10ML IV SOSY
PREFILLED_SYRINGE | INTRAVENOUS | Status: DC | PRN
  Administered 2021-04-01: 120 mg via INTRAVENOUS

## 2021-04-01 MED ORDER — LIDOCAINE 2% (20 MG/ML) 5 ML SYRINGE
INTRAMUSCULAR | Status: AC
  Filled 2021-04-01: qty 5

## 2021-04-01 MED ORDER — ROCURONIUM BROMIDE 10 MG/ML (PF) SYRINGE
PREFILLED_SYRINGE | INTRAVENOUS | Status: DC | PRN
  Administered 2021-04-01: 50 mg via INTRAVENOUS

## 2021-04-01 MED ORDER — CHLORHEXIDINE GLUCONATE 0.12 % MT SOLN
OROMUCOSAL | Status: AC
  Administered 2021-04-01: 15 mL via OROMUCOSAL
  Filled 2021-04-01: qty 15

## 2021-04-01 MED ORDER — FENTANYL CITRATE (PF) 100 MCG/2ML IJ SOLN
25.0000 ug | INTRAMUSCULAR | Status: DC | PRN
  Administered 2021-04-01: 50 ug via INTRAVENOUS

## 2021-04-01 MED ORDER — PHENYLEPHRINE 40 MCG/ML (10ML) SYRINGE FOR IV PUSH (FOR BLOOD PRESSURE SUPPORT)
PREFILLED_SYRINGE | INTRAVENOUS | Status: DC | PRN
  Administered 2021-04-01: 120 ug via INTRAVENOUS
  Administered 2021-04-01: 80 ug via INTRAVENOUS

## 2021-04-01 MED ORDER — ONDANSETRON HCL 4 MG/2ML IJ SOLN: 4.0000 mg | Freq: Four times a day (QID) | INTRAMUSCULAR | Status: DC | PRN

## 2021-04-01 MED ORDER — PROPOFOL 10 MG/ML IV BOLUS
INTRAVENOUS | Status: AC
  Filled 2021-04-01: qty 20

## 2021-04-01 MED ORDER — THROMBIN 20000 UNITS EX SOLR
CUTANEOUS | Status: AC
  Filled 2021-04-01: qty 20000

## 2021-04-01 MED ORDER — MENTHOL 3 MG MT LOZG: 1.0000 | LOZENGE | OROMUCOSAL | Status: DC | PRN

## 2021-04-01 MED ORDER — ACETAMINOPHEN 500 MG PO TABS
1000.0000 mg | ORAL_TABLET | Freq: Once | ORAL | Status: AC
  Administered 2021-04-01: 1000 mg via ORAL
  Filled 2021-04-01: qty 2

## 2021-04-01 MED ORDER — LACTATED RINGERS IV SOLN: INTRAVENOUS | Status: DC

## 2021-04-01 MED ORDER — EPHEDRINE SULFATE-NACL 50-0.9 MG/10ML-% IV SOSY
PREFILLED_SYRINGE | INTRAVENOUS | Status: DC | PRN
  Administered 2021-04-01: 15 mg via INTRAVENOUS

## 2021-04-01 MED ORDER — PROPOFOL 10 MG/ML IV BOLUS
INTRAVENOUS | Status: DC | PRN
  Administered 2021-04-01: 100 mg via INTRAVENOUS

## 2021-04-01 MED ORDER — MIDAZOLAM HCL 2 MG/2ML IJ SOLN
INTRAMUSCULAR | Status: AC
  Filled 2021-04-01: qty 2

## 2021-04-01 MED ORDER — BUPIVACAINE HCL (PF) 0.25 % IJ SOLN
INTRAMUSCULAR | Status: AC
  Filled 2021-04-01: qty 30

## 2021-04-01 MED ORDER — CHLORHEXIDINE GLUCONATE 0.12 % MT SOLN: 15.0000 mL | Freq: Once | OROMUCOSAL | Status: AC

## 2021-04-01 MED ORDER — ACETAMINOPHEN 325 MG PO TABS: 650.0000 mg | ORAL_TABLET | ORAL | Status: DC | PRN

## 2021-04-01 MED ORDER — LACTATED RINGERS IV SOLN: INTRAVENOUS | Status: DC | PRN

## 2021-04-01 MED ORDER — PROMETHAZINE HCL 25 MG/ML IJ SOLN: 6.2500 mg | INTRAMUSCULAR | Status: DC | PRN

## 2021-04-01 MED ORDER — ONDANSETRON HCL 4 MG/2ML IJ SOLN: 4.0000 mg | Freq: Once | INTRAMUSCULAR | Status: DC | PRN

## 2021-04-01 MED ORDER — SODIUM CHLORIDE 0.9% FLUSH: 3.0000 mL | INTRAVENOUS | Status: DC | PRN

## 2021-04-01 MED ORDER — AMISULPRIDE (ANTIEMETIC) 5 MG/2ML IV SOLN: 10.0000 mg | Freq: Once | INTRAVENOUS | Status: DC | PRN

## 2021-04-01 MED ORDER — DOCUSATE SODIUM 100 MG PO CAPS
100.0000 mg | ORAL_CAPSULE | Freq: Every day | ORAL | Status: DC
  Administered 2021-04-02 – 2021-04-04 (×3): 100 mg via ORAL
  Filled 2021-04-01 (×4): qty 1

## 2021-04-01 MED ORDER — ONDANSETRON HCL 4 MG/2ML IJ SOLN
INTRAMUSCULAR | Status: DC | PRN
  Administered 2021-04-01: 4 mg via INTRAVENOUS

## 2021-04-01 MED ORDER — SODIUM CHLORIDE 0.9% FLUSH
3.0000 mL | Freq: Two times a day (BID) | INTRAVENOUS | Status: DC
  Administered 2021-04-01 – 2021-04-04 (×4): 3 mL via INTRAVENOUS

## 2021-04-01 MED ORDER — CYCLOBENZAPRINE HCL 10 MG PO TABS
10.0000 mg | ORAL_TABLET | Freq: Three times a day (TID) | ORAL | Status: DC | PRN
  Administered 2021-04-03: 10 mg via ORAL
  Filled 2021-04-01: qty 1

## 2021-04-01 MED ORDER — FENTANYL CITRATE (PF) 100 MCG/2ML IJ SOLN
INTRAMUSCULAR | Status: AC
  Filled 2021-04-01: qty 2

## 2021-04-01 MED ORDER — CEFAZOLIN SODIUM-DEXTROSE 2-3 GM-%(50ML) IV SOLR
INTRAVENOUS | Status: DC | PRN
  Administered 2021-04-01: 2 g via INTRAVENOUS

## 2021-04-01 MED ORDER — ACETAMINOPHEN 650 MG RE SUPP: 650.0000 mg | RECTAL | Status: DC | PRN

## 2021-04-01 MED ORDER — ALUM & MAG HYDROXIDE-SIMETH 200-200-20 MG/5ML PO SUSP: 30.0000 mL | Freq: Four times a day (QID) | ORAL | Status: DC | PRN

## 2021-04-01 MED ORDER — DEXAMETHASONE SODIUM PHOSPHATE 10 MG/ML IJ SOLN
INTRAMUSCULAR | Status: DC | PRN
  Administered 2021-04-01: 10 mg via INTRAVENOUS

## 2021-04-01 MED ORDER — THROMBIN 20000 UNITS EX SOLR
CUTANEOUS | Status: DC | PRN
  Administered 2021-04-01: 20 mL via TOPICAL

## 2021-04-01 MED ORDER — 0.9 % SODIUM CHLORIDE (POUR BTL) OPTIME
TOPICAL | Status: DC | PRN
  Administered 2021-04-01: 1000 mL

## 2021-04-01 MED ORDER — PANTOPRAZOLE SODIUM 40 MG IV SOLR
40.0000 mg | Freq: Every day | INTRAVENOUS | Status: DC
  Administered 2021-04-01: 40 mg via INTRAVENOUS
  Filled 2021-04-01: qty 40

## 2021-04-01 SURGICAL SUPPLY — 70 items
ADH SKN CLS APL DERMABOND .7 (GAUZE/BANDAGES/DRESSINGS) ×1
APL SKNCLS STERI-STRIP NONHPOA (GAUZE/BANDAGES/DRESSINGS) ×1
BAG COUNTER SPONGE SURGICOUNT (BAG) ×3 IMPLANT
BAG SPNG CNTER NS LX DISP (BAG) ×1
BENZOIN TINCTURE PRP APPL 2/3 (GAUZE/BANDAGES/DRESSINGS) ×3 IMPLANT
BLADE CLIPPER SURG (BLADE) IMPLANT
BLADE SURG 11 STRL SS (BLADE) ×2 IMPLANT
BUR MATCHSTICK NEURO 3.0 LAGG (BURR) ×2 IMPLANT
BUR PRECISION FLUTE 6.0 (BURR) ×2 IMPLANT
CANISTER SUCT 3000ML PPV (MISCELLANEOUS) ×3 IMPLANT
CAP LOCKING THREADED (Cap) ×4 IMPLANT
CARTRIDGE OIL MAESTRO DRILL (MISCELLANEOUS) ×2 IMPLANT
CLSR STERI-STRIP ANTIMIC 1/2X4 (GAUZE/BANDAGES/DRESSINGS) ×1 IMPLANT
CNTNR URN SCR LID CUP LEK RST (MISCELLANEOUS) ×2 IMPLANT
CONT SPEC 4OZ STRL OR WHT (MISCELLANEOUS) ×2
COVER BACK TABLE 24X17X13 BIG (DRAPES) IMPLANT
COVER BACK TABLE 60X90IN (DRAPES) ×2 IMPLANT
DECANTER SPIKE VIAL GLASS SM (MISCELLANEOUS) ×3 IMPLANT
DERMABOND ADVANCED (GAUZE/BANDAGES/DRESSINGS) ×1
DERMABOND ADVANCED .7 DNX12 (GAUZE/BANDAGES/DRESSINGS) ×2 IMPLANT
DIFFUSER DRILL AIR PNEUMATIC (MISCELLANEOUS) ×2 IMPLANT
DRAPE C-ARM 42X72 X-RAY (DRAPES) ×6 IMPLANT
DRAPE HALF SHEET 40X57 (DRAPES) IMPLANT
DRAPE LAPAROTOMY 100X72X124 (DRAPES) ×3 IMPLANT
DRAPE POUCH INSTRU U-SHP 10X18 (DRAPES) ×3 IMPLANT
DRAPE SURG 17X23 STRL (DRAPES) ×3 IMPLANT
DRSG OPSITE 4X5.5 SM (GAUZE/BANDAGES/DRESSINGS) ×1 IMPLANT
DRSG OPSITE POSTOP 4X12 (GAUZE/BANDAGES/DRESSINGS) ×1 IMPLANT
DRSG OPSITE POSTOP 4X6 (GAUZE/BANDAGES/DRESSINGS) ×1 IMPLANT
DURAPREP 26ML APPLICATOR (WOUND CARE) ×3 IMPLANT
ELECT REM PT RETURN 9FT ADLT (ELECTROSURGICAL) ×2
ELECTRODE REM PT RTRN 9FT ADLT (ELECTROSURGICAL) ×2 IMPLANT
EVACUATOR 1/8 PVC DRAIN (DRAIN) ×1 IMPLANT
EVACUATOR 3/16  PVC DRAIN (DRAIN)
EVACUATOR 3/16 PVC DRAIN (DRAIN) ×2 IMPLANT
GAUZE 4X4 16PLY ~~LOC~~+RFID DBL (SPONGE) ×1 IMPLANT
GAUZE SPONGE 4X4 12PLY STRL (GAUZE/BANDAGES/DRESSINGS) ×2 IMPLANT
GAUZE SPONGE 4X4 12PLY STRL LF (GAUZE/BANDAGES/DRESSINGS) ×1 IMPLANT
GLOVE EXAM NITRILE XL STR (GLOVE) IMPLANT
GLOVE SURG ENC MOIS LTX SZ7 (GLOVE) ×1 IMPLANT
GLOVE SURG ENC MOIS LTX SZ8 (GLOVE) ×6 IMPLANT
GLOVE SURG UNDER POLY LF SZ7 (GLOVE) ×1 IMPLANT
GLOVE SURG UNDER POLY LF SZ8.5 (GLOVE) ×6 IMPLANT
GOWN STRL REUS W/ TWL LRG LVL3 (GOWN DISPOSABLE) IMPLANT
GOWN STRL REUS W/ TWL XL LVL3 (GOWN DISPOSABLE) ×4 IMPLANT
GOWN STRL REUS W/TWL 2XL LVL3 (GOWN DISPOSABLE) IMPLANT
GOWN STRL REUS W/TWL LRG LVL3 (GOWN DISPOSABLE) ×4
GOWN STRL REUS W/TWL XL LVL3 (GOWN DISPOSABLE) ×4
KIT BASIN OR (CUSTOM PROCEDURE TRAY) ×3 IMPLANT
KIT TURNOVER KIT B (KITS) ×3 IMPLANT
NDL HYPO 25X1 1.5 SAFETY (NEEDLE) ×2 IMPLANT
NEEDLE HYPO 25X1 1.5 SAFETY (NEEDLE) ×2 IMPLANT
NS IRRIG 1000ML POUR BTL (IV SOLUTION) ×3 IMPLANT
OIL CARTRIDGE MAESTRO DRILL (MISCELLANEOUS)
PACK LAMINECTOMY NEURO (CUSTOM PROCEDURE TRAY) ×3 IMPLANT
PAD ARMBOARD 7.5X6 YLW CONV (MISCELLANEOUS) ×9 IMPLANT
ROD HEX TITAL ALLOY 200MM (Rod) ×1 IMPLANT
SCREW SPINAL CREO 7.5X40 (Screw) ×1 IMPLANT
SPONGE SURGIFOAM ABS GEL 100 (HEMOSTASIS) ×3 IMPLANT
SPONGE T-LAP 4X18 ~~LOC~~+RFID (SPONGE) ×1 IMPLANT
STRIP CLOSURE SKIN 1/2X4 (GAUZE/BANDAGES/DRESSINGS) ×6 IMPLANT
SUT ETHILON 3 0 FSL (SUTURE) ×1 IMPLANT
SUT VIC AB 0 CT1 18XCR BRD8 (SUTURE) ×4 IMPLANT
SUT VIC AB 0 CT1 8-18 (SUTURE) ×4
SUT VIC AB 2-0 CT1 18 (SUTURE) ×4 IMPLANT
SUT VICRYL 4-0 PS2 18IN ABS (SUTURE) ×3 IMPLANT
TOWEL GREEN STERILE (TOWEL DISPOSABLE) ×3 IMPLANT
TOWEL GREEN STERILE FF (TOWEL DISPOSABLE) ×3 IMPLANT
TRAY FOLEY MTR SLVR 16FR STAT (SET/KITS/TRAYS/PACK) ×3 IMPLANT
WATER STERILE IRR 1000ML POUR (IV SOLUTION) ×3 IMPLANT

## 2021-04-01 NOTE — Anesthesia Procedure Notes (Signed)
Procedure Name: Intubation Date/Time: 04/01/2021 5:15 PM Performed by: Minerva Ends, CRNA Pre-anesthesia Checklist: Patient identified, Emergency Drugs available, Suction available and Patient being monitored Patient Re-evaluated:Patient Re-evaluated prior to induction Oxygen Delivery Method: Circle system utilized Preoxygenation: Pre-oxygenation with 100% oxygen Induction Type: IV induction, Cricoid Pressure applied and Rapid sequence Laryngoscope Size: Mac and 3 Grade View: Grade III Tube type: Oral Tube size: 7.0 mm Number of attempts: 1 Airway Equipment and Method: Stylet Placement Confirmation: ETT inserted through vocal cords under direct vision, positive ETCO2 and breath sounds checked- equal and bilateral Secured at: 22 cm Tube secured with: Tape Dental Injury: Teeth and Oropharynx as per pre-operative assessment

## 2021-04-01 NOTE — Progress Notes (Signed)
Subjective: Patient reports  worsening right hip and leg pain  Objective: Vital signs in last 24 hours: Temp:  [97.8 F (36.6 C)-98.9 F (37.2 C)] 98.4 F (36.9 C) (01/16 0330) Pulse Rate:  [85-103] 87 (01/16 0330) Resp:  [17-20] 18 (01/16 0330) BP: (96-139)/(46-92) 115/58 (01/16 0330) SpO2:  [92 %-99 %] 94 % (01/16 0330)  Intake/Output from previous day: 01/15 0701 - 01/16 0700 In: 720 [P.O.:720] Out: 495 [Drains:495] Intake/Output this shift: No intake/output data recorded.  Strength 5 out of 5 both lower extremities iliopsoas, quads, hamstrings, gastrocs, into tibialis, and EHL.  Incision clean dry and intact  Lab Results: No results for input(s): WBC, HGB, HCT, PLT in the last 72 hours. BMET No results for input(s): NA, K, CL, CO2, GLUCOSE, BUN, CREATININE, CALCIUM in the last 72 hours.  Studies/Results: CT LUMBAR SPINE WO CONTRAST  Result Date: 03/30/2021 CLINICAL DATA:  Spine surgery/procedure, postop, infection suspected. EXAM: CT LUMBAR SPINE WITHOUT CONTRAST TECHNIQUE: Multidetector CT imaging of the lumbar spine was performed without intravenous contrast administration. Multiplanar CT image reconstructions were also generated. RADIATION DOSE REDUCTION: This exam was performed according to the departmental dose-optimization program which includes automated exposure control, adjustment of the mA and/or kV according to patient size and/or use of iterative reconstruction technique. COMPARISON:  02/21/2021 FINDINGS: Segmentation: 5 lumbar type vertebrae. Alignment: Normal. Vertebrae: There are bilateral transpedicular screws at T12 and L1. Interbody fusion devices at L2-3, L3-4, L4-5 and L5-S1. There is no complete arthrodesis at any of the levels of interbody fusion. No hardware abnormality. Mild subsidence at L2-3 and L3-4 is unchanged. L5 subsidence also unchanged. L4 right pedicle fracture remains unfused. 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 Paraspinal and other  soft tissues: Calcific aortic atherosclerosis. Postsurgical changes in the dorsal soft tissues. Disc levels: L1-2: No spinal canal stenosis. L2-3: Posterior decompression. No spinal canal stenosis. Mild bilateral foraminal stenosis. L3-4: Postsurgical changes with no spinal canal stenosis. Mild bilateral foraminal narrowing. L4-5: Postsurgical changes with no spinal canal or neural foraminal stenosis. L5-S1: Postfusion changes with mild right and moderate left foraminal stenosis. No spinal canal stenosis. IMPRESSION: 1. Unfused L4 right pedicle fracture. 2. No change in spinal hardware compared to 02/21/2021. No interbody arthrodesis. 3. Unchanged mild right and moderate left L5-S1 foraminal stenosis. Aortic Atherosclerosis (ICD10-I70.0). Electronically Signed   By: Ulyses Jarred M.D.   On: 03/30/2021 20:13    Assessment/Plan: 71 year old female who is 5 days out from a lumbar spinal fusion with progressive worsening right hip and leg pain.  CT scan over the weekend showed the right S1 screw had migrated laterally at the level of the inferior aspect of the disc space.  Intraoperative CT look like all screws were in good position however in light of the patient's progressive worsening right leg pain that seems to be consistent with either L5 or S1 and CT evidence of a screw that is lateral to the T8 vertebral body and lateral of the disc space at that level I recommended reexploration of lumbar fusion disconnecting of her rod and removal and possible replacement of right S1 pedicle screw.  I extensively gone over the risks and benefits of that procedure with her as well as perioperative course expectations of outcome and alternatives of surgery and she understands and agrees to proceed forward.  LOS: 5 days     Angie Olson 04/01/2021, 8:24 AM

## 2021-04-01 NOTE — Progress Notes (Signed)
PT Cancellation Note  Patient Details Name: Angie Olson MRN: [id number] DOB: 10-27-1950   Cancelled Treatment:    Reason Eval/Treat Not Completed: Patient at procedure or test/unavailable - plan for surgical revision, PT to check back post-op.   Stacie Glaze, PT DPT Acute Rehabilitation Services Pager [phone]  Office [phone]    [name] 04/01/2021, 11:41 AM

## 2021-04-01 NOTE — Anesthesia Preprocedure Evaluation (Addendum)
Anesthesia Evaluation  Patient identified by MRN, date of birth, ID band Patient awake    Reviewed: Allergy & Precautions, NPO status , Patient's Chart, lab work & pertinent test results  History of Anesthesia Complications (+) PONV and history of anesthetic complications (8177 ankle surgery PONV. respiratory arrest 01/2019)  Airway Mallampati: II  TM Distance: >3 FB Neck ROM: Full    Dental no notable dental hx.    Pulmonary shortness of breath and with exertion,  Denies snoring   Pulmonary exam normal breath sounds clear to auscultation       Cardiovascular hypertension, Pt. on medications +CHF (grade 1 diastolic dysfunction) and + DOE  Normal cardiovascular exam Rhythm:Regular Rate:Normal  Echo 2015: Left ventricle: The cavity size was normal. Systolic  function was normal. The estimated ejection fraction was in  the range of 60% to 65%. Wall motion was normal; there were  no regional wall motion abnormalities. Doppler parameters  are consistent with abnormal left ventricular relaxation  (grade 1 diastolic dysfunction).      Neuro/Psych PSYCHIATRIC DISORDERS Anxiety CVA (2013), No Residual Symptoms    GI/Hepatic Neg liver ROS, hiatal hernia, GERD  Controlled and Medicated,  Endo/Other  diabetes (pre-diabetic)BMI 33  Renal/GU negative Renal ROS     Musculoskeletal  (+) Arthritis , Osteoarthritis and Rheumatoid disorders,  Lumbar fx  S/p multiple back surgeries- last one 07/2020 Takes prednisone 5mg  daily for RA- took it this AM   Abdominal (+) + obese,   Peds  Hematology  (+) anemia , hct 33.9   Anesthesia Other Findings Chronic pain Right Leg Pain Malpositioned Screw  Reproductive/Obstetrics                            Anesthesia Physical  Anesthesia Plan  ASA: 3  Anesthesia Plan: General   Post-op Pain Management: Tylenol PO (pre-op)   Induction: Intravenous and Rapid  sequence  PONV Risk Score and Plan: 4 or greater and Ondansetron, Dexamethasone, Midazolam and Treatment may vary due to age or medical condition  Airway Management Planned: Oral ETT  Additional Equipment: None  Intra-op Plan:   Post-operative Plan: Extubation in OR  Informed Consent: I have reviewed the patients History and Physical, chart, labs and discussed the procedure including the risks, benefits and alternatives for the proposed anesthesia with the patient or authorized representative who has indicated his/her understanding and acceptance.     Dental advisory given  Plan Discussed with: CRNA  Anesthesia Plan Comments:        Anesthesia Quick Evaluation

## 2021-04-01 NOTE — Op Note (Signed)
Preoperative diagnosis: Malposition screw  Postoperative diagnosis: Same  Procedure: Exploration fusion removal of hardware on the right with disconnecting the rod removal of top tightening knots and removal of malpositioned migrated right S1 screw with replacement and repositioning of right S1 pedicle screw replacement of rod and anchoring down to top tightening nuts.  Surgeon: Dominica Severin Gerene Nedd  Assistant: Nash Shearer  Anesthesia: General  EBL: Minimal  HPI: 71 year old female who last week underwent spinal fusion and did work fairly well initially however through the weekend had progressive worsening right hip and leg pain CT scan showed migrated S1 screw superiorly and into the disc base and laterally to the vertebral body.  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  I extensively went over the risk and benefits of that operation with her as well as perioperative course expectations of outcome and alternatives of surgery and she understood and agreed to proceed forward.  Operative procedure: Patient was brought into the OR was [facility] general anesthesia positioned prone the Wilson frame her back was prepped and draped in routine sterile fashion roll incision was opened up and expose the hardware I disconnected the rod on the right removed it from the connector and removed the right S1 screw which when I palpated the hole it was laterally and not in the S1 vertebral body.  Then utilizing AP fluoroscopy I started a new entry point inferomedially and directed the screw inferiorly and under AP fluoroscopy confirmed it was placed in the S1 pedicle.  I then recontoured a new rod dropped in place and anchored all the knots down including the connector.  Repositioned all the bone graft and copiously irrigated  the wound meticulous hemostasis was maintained a medium Hemovac drain was placed the wound was closed in layers with interrupted Vicryl and a running nylon in the skin.  At the end the case all needle count sponge counts were correct.

## 2021-04-01 NOTE — Progress Notes (Signed)
OT Cancellation Note  Patient Details Name: Angie Olson MRN: [id number] DOB: 1950-03-23   Cancelled Treatment:    Reason Eval/Treat Not Completed:  (Pt to return to surgery for revision.)  [name] 04/01/2021, 1:20 PM Nestor Lewandowsky, OTR/L Acute Rehabilitation Services Pager: [phone] Office: [phone]

## 2021-04-01 NOTE — Progress Notes (Signed)
Patient crying complaining of severe pain after ambulation to bathroom and requesting IV pain medication. RN obtained vitals and BP low at 96/49 Map 58. Educated patient that additional pain medication could not be administered with BP that low and patient verbalized understanding. Patient asleep when RN entered room to recheck.

## 2021-04-02 ENCOUNTER — Encounter (HOSPITAL_COMMUNITY): Payer: Self-pay | Admitting: Neurosurgery

## 2021-04-02 MED ORDER — PANTOPRAZOLE SODIUM 40 MG PO TBEC
40.0000 mg | DELAYED_RELEASE_TABLET | Freq: Every day | ORAL | Status: DC
  Administered 2021-04-02 – 2021-04-04 (×3): 40 mg via ORAL
  Filled 2021-04-02 (×3): qty 1

## 2021-04-02 NOTE — Anesthesia Postprocedure Evaluation (Signed)
Anesthesia Post Note  Patient: Angie Olson  Procedure(s) Performed: REEXPLORATION OF LUMBAR FUSION FOR REMOVAL AND REPLACEMENT OF MALPOSITIONED RIGHT SACRAL ONE PEDICLE SCREW (Back)     Patient location during evaluation: PACU Anesthesia Type: General Level of consciousness: awake and alert Pain management: pain level controlled Vital Signs Assessment: post-procedure vital signs reviewed and stable Respiratory status: spontaneous breathing, nonlabored ventilation, respiratory function stable and patient connected to nasal cannula oxygen Cardiovascular status: blood pressure returned to baseline and stable Postop Assessment: no apparent nausea or vomiting Anesthetic complications: no   No notable events documented.  Last Vitals:  Vitals:   04/02/21 0338 04/02/21 0739  BP: 138/79 124/68  Pulse: 91 87  Resp: 16 17  Temp: 36.9 C 37 C  SpO2: 93% 92%    Last Pain:  Vitals:   04/02/21 0739  TempSrc: Oral  PainSc:                  March Rummage Sandi Towe

## 2021-04-02 NOTE — TOC Progression Note (Addendum)
Transition of Care [facility]) - Progression Note    Patient Details  Name: Angie Olson MRN: [id number] Date of Birth: 1950-04-01  Transition of Care [facility]) CM/SW [place], RN Phone Number:[phone]  04/02/2021, 3:44 PM  Clinical Narrative:    TOC following patient. Patient  continues to states that she does not wish to pursue [facility] after discharge. Patient states that plan is still to d/c to her condo here in [place] for a short time where she will have friend assist with her needs.   Expected Discharge Plan: Home/Self Care (Refuses Home health) Barriers to Discharge: Continued Medical Work up  Expected Discharge Plan and Services Expected Discharge Plan: Home/Self Care (Refuses Home health) In-house Referral: NA Discharge Planning Services: CM Consult Post Acute Care Choice: NA Living arrangements for the past 2 months: Single Family Home                 DME Arranged: N/A (refused) DME Agency: NA       HH Arranged: Refused [place] Agency: NA         Social Determinants of Health (SDOH) Interventions    Readmission Risk Interventions Readmission Risk Prevention Plan 01/30/2020  Transportation Screening Complete  PCP or Specialist Appt within 5-7 Days Complete  Home Care Screening Complete  Medication Review (RN CM) Complete  Some recent data might be hidden

## 2021-04-02 NOTE — Transfer of Care (Signed)
Immediate Anesthesia Transfer of Care Note  Patient: Angie Olson  Procedure(s) Performed: REEXPLORATION OF LUMBAR FUSION FOR REMOVAL AND REPLACEMENT OF MALPOSITIONED RIGHT SACRAL ONE PEDICLE SCREW (Back)  Patient Location: PACU  Anesthesia Type:General  Level of Consciousness: awake, alert  and oriented  Airway & Oxygen Therapy: Patient Spontanous Breathing  Post-op Assessment: Report given to RN and Post -op Vital signs reviewed and stable  Post vital signs: Reviewed and stable  Last Vitals:  Vitals Value Taken Time  BP 124/68 04/02/21 0739  Temp 37 C 04/02/21 0739  Pulse 90 04/02/21 0741  Resp 17 04/02/21 0739  SpO2 97 % 04/02/21 0741  Vitals shown include unvalidated device data.  Last Pain:  Vitals:   04/02/21 0739  TempSrc: Oral  PainSc:       Patients Stated Pain Goal: 0 (74/93/55 2174)  Complications: No notable events documented.

## 2021-04-02 NOTE — Progress Notes (Signed)
Subjective: Patient reports  significant improvement in right lower extremity radicular symptoms  Objective: Vital signs in last 24 hours: Temp:  [97.7 F (36.5 C)-98.7 F (37.1 C)] 98.6 F (37 C) (01/17 0739) Pulse Rate:  [70-98] 87 (01/17 0739) Resp:  [12-20] 17 (01/17 0739) BP: (96-144)/(48-79) 124/68 (01/17 0739) SpO2:  [92 %-100 %] 92 % (01/17 0739)  Intake/Output from previous day: 01/16 0701 - 01/17 0700 In: 1081.8 [I.V.:831.8; IV Piggyback:250] Out: 150 [Drains:100; Blood:50] Intake/Output this shift: No intake/output data recorded.  Strength 5 out of 5 wound clean dry and intact  Lab Results: No results for input(s): WBC, HGB, HCT, PLT in the last 72 hours. BMET No results for input(s): NA, K, CL, CO2, GLUCOSE, BUN, CREATININE, CALCIUM in the last 72 hours.  Studies/Results: DG Lumbar Spine 1 View  Result Date: 04/01/2021 CLINICAL DATA:  Right side screw removal and replacement EXAM: LUMBAR SPINE - 1 VIEW COMPARISON:  X-ray lumbar spine 02/14/2021. CT lumbar spine 03/30/2021 low FINDINGS: Intraoperative interval removal of the right-sided rod and L4 pedicular screw. Single low resolution intraoperative spot views of the lower lumbar spine were obtained. No new fracture visible on the limited views. Total fluoroscopy time: 18 seconds Total radiation dose: 17.7 mGy IMPRESSION: Intraoperative interval removal of the right-sided rod and L4 pedicular screw. Electronically Signed   By: Iven Finn M.D.   On: 04/01/2021 18:14   DG C-Arm 1-60 Min-No Report  Result Date: 04/01/2021 Fluoroscopy was utilized by the requesting physician.  No radiographic interpretation.    Assessment/Plan: Doing well with improvced radicular symptoms.  PT/OT/  LOS: 6 days     Elaina Hoops 04/02/2021, 8:14 AM

## 2021-04-02 NOTE — Progress Notes (Signed)
Physical Therapy Treatment Patient Details Name: Angie Olson MRN: [id number] DOB: 04-Feb-1951 Today's Date: 04/02/2021   History of Present Illness 71 yo female s/p reexploration of lumbar wound for exposure for revision of spinal fusion, posterior lateral fusion L2-S1 on 1/11. 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 PMH includes multiple lumbar decompression and fusion operations, anixety, melanoma, HTN, R THA 2018, R knee arthroscopy.    PT Comments    Pt sat up in chair earlier today for lunch and was pleased that she was able to tolerate >1 hr. Pt reports that her back is sore since doing that but still agreeable to get up and ambulate. Pt moving to EOB with increased time but no physical assist and doing well keeping back precautions. Pt ambulated 110' with RW and min-guard A working on abdominal activation for more erect posture and staying close to RW. Pt encouraged to be able to increase walking distance. PT will continue to follow.    Recommendations for follow up therapy are one component of a multi-disciplinary discharge planning process, led by the attending physician.  Recommendations may be updated based on patient status, additional functional criteria and insurance authorization.  Follow Up Recommendations  Follow physician's recommendations for discharge plan and follow up therapies (HHPT, assist from sister)     Assistance Recommended at Discharge Intermittent Supervision/Assistance  Patient can return home with the following A little help with walking and/or transfers;A little help with bathing/dressing/bathroom;Assistance with cooking/housework;Direct supervision/assist for medications management;Assist for transportation   Equipment Recommendations  Rolling walker (2 wheels)    Recommendations for Other Services       Precautions / Restrictions  Precautions Precautions: Back Precaution Booklet Issued: No Precaution Comments: pt able to mobilize keeping back precautions Required Braces or Orthoses:  (no brace) Restrictions Weight Bearing Restrictions: No     Mobility  Bed Mobility Overal bed mobility: Needs Assistance Bed Mobility: Rolling, Sidelying to Sit, Sit to Supine Rolling: Supervision Sidelying to sit: Supervision   Sit to supine: Supervision   General bed mobility comments: good technique    Transfers Overall transfer level: Needs assistance Equipment used: Rolling walker (2 wheels) Transfers: Sit to/from Stand Sit to Stand: Min guard           General transfer comment: extra time but no physical assist from bed or toilet. Pt prefers to put hands on RW from bed, RW stabilized    Ambulation/Gait Ambulation/Gait assistance: Min guard Gait Distance (Feet): 110 Feet Assistive device: Rolling walker (2 wheels) Gait Pattern/deviations: Step-through pattern, Decreased stride length, Trunk flexed Gait velocity: decr Gait velocity interpretation: 1.31 - 2.62 ft/sec, indicative of limited community ambulator   General Gait Details: worked on abdominal activation during gait for more erect posture as well as staying close to RW. Pt self corrected once   Stairs             Wheelchair Mobility    Modified Rankin (Stroke Patients Only)       Balance Overall balance assessment: Needs assistance Sitting-balance support: No upper extremity supported, Feet supported Sitting balance-Leahy Scale: Fair     Standing balance support: Bilateral upper extremity supported, During functional activity, Reliant on assistive device for balance Standing balance-Leahy Scale: Poor Standing balance comment: reliant on external support  Cognition Arousal/Alertness: Awake/alert Behavior During Therapy: WFL for tasks assessed/performed Overall Cognitive Status: Within  Functional Limits for tasks assessed                                 General Comments: anxiety not seeming to affect mobility today        Exercises      General Comments General comments (skin integrity, edema, etc.): pt mildly dizzy initially, subsided while up      Pertinent Vitals/Pain Pain Assessment Pain Assessment: Faces Faces Pain Scale: Hurts even more Pain Location: back Pain Descriptors / Indicators: Guarding Pain Intervention(s): Limited activity within patient's tolerance, Monitored during session    Home Living                          Prior Function            PT Goals (current goals can now be found in the care plan section) Acute Rehab PT Goals Patient Stated Goal: home PT Goal Formulation: With patient Time For Goal Achievement: 04/11/21 Potential to Achieve Goals: Good Progress towards PT goals: Progressing toward goals    Frequency    Min 5X/week      PT Plan Current plan remains appropriate    Co-evaluation              AM-PAC PT "6 Clicks" Mobility   Outcome Measure  Help needed turning from your back to your side while in a flat bed without using bedrails?: A Little Help needed moving from lying on your back to sitting on the side of a flat bed without using bedrails?: A Little Help needed moving to and from a bed to a chair (including a wheelchair)?: A Little Help needed standing up from a chair using your arms (e.g., wheelchair or bedside chair)?: A Little Help needed to walk in hospital room?: A Little Help needed climbing 3-5 steps with a railing? : A Lot 6 Click Score: 17    End of Session   Activity Tolerance: Patient tolerated treatment well Patient left: with call bell/phone within reach;in bed Nurse Communication: Mobility status PT Visit Diagnosis: Other abnormalities of gait and mobility (R26.89);Difficulty in walking, not elsewhere classified (R26.2);Pain Pain - part of body:  (back)      Time: 6553-7482 PT Time Calculation (min) (ACUTE ONLY): 21 min  Charges:  $Gait Training: 8-22 mins                     Leighton Roach, [place]  Pager [phone] Office [place] 04/02/2021, 4:39 PM

## 2021-04-02 NOTE — Progress Notes (Signed)
Occupational Therapy Treatment Patient Details Name: Angie Olson MRN: [id number] DOB: [birth year] Today's Date: 04/02/2021   History of present illness 71 yo female s/p reexploration of lumbar wound for exposure for revision of spinal fusion, posterior lateral fusion L2-S1 on 1/11. 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 PMH includes multiple lumbar decompression and fusion operations, anixety, melanoma, HTN, R THA 2018, R knee arthroscopy.   OT comments  Pt progressing towards acute OT goals. Pt able to walk to/from bathroom and in the room a bit, toilet transfer, pericare in seated position, 2 grooming tasks with up to min guard assist. Extra time and effort but no physical assist. Pt with increased pain with mobility and OOB activity including sitting in recliner but able to continue. D/c recommendation remains appropriate.    Recommendations for follow up therapy are one component of a multi-disciplinary discharge planning process, led by the attending physician.  Recommendations may be updated based on patient status, additional functional criteria and insurance authorization.    Follow Up Recommendations  Follow physician's recommendations for discharge plan and follow up therapies    Assistance Recommended at Discharge Intermittent Supervision/Assistance  Patient can return home with the following  Assistance with cooking/housework;Assist for transportation;A little help with bathing/dressing/bathroom;A little help with walking and/or transfers   Equipment Recommendations  BSC/3in1    Recommendations for Other Services      Precautions / Restrictions Precautions Precautions: Back Required Braces or Orthoses:  (no brace) Restrictions Weight Bearing Restrictions: No       Mobility Bed Mobility Overal bed mobility: Needs Assistance Bed Mobility: Rolling, Sidelying to  Sit Rolling: Supervision Sidelying to sit: Supervision       General bed mobility comments: good technique    Transfers Overall transfer level: Needs assistance Equipment used: Rolling walker (2 wheels) Transfers: Sit to/from Stand Sit to Stand: Min guard           General transfer comment: close min guard for safety. extra time and effort, no physical assist. to/from EOB, comfort height toilet, and recliner.     Balance Overall balance assessment: Needs assistance Sitting-balance support: No upper extremity supported, Feet supported Sitting balance-Leahy Scale: Fair     Standing balance support: Bilateral upper extremity supported, During functional activity, Reliant on assistive device for balance Standing balance-Leahy Scale: Poor Standing balance comment: reliant on external support                           ADL either performed or assessed with clinical judgement   ADL Overall ADL's : Needs assistance/impaired     Grooming: Wash/dry hands;Oral care;Set up;Min guard Grooming Details (indicate cue type and reason): discussed 2 cup technique to avoid bending while brushing teeth. Min guard to steady in standing position at sink                 Toilet Transfer: Min guard;Ambulation;Comfort height toilet;Grab bars Toilet Transfer Details (indicate cue type and reason): extra time and effort. no physical assist. Toileting- Clothing Manipulation and Hygiene: Sitting/lateral lean;Set up Toileting - Clothing Manipulation Details (indicate cue type and reason): discussed strategies for posterior pericare. pt familiar from previous back surgeries     Functional mobility during ADLs: Min guard;Rolling walker (2 wheels) General ADL Comments: Extra time and effort, cues for pacing activity for safety ith functional mobility and ADLs.  Extremity/Trunk Assessment Upper Extremity Assessment Upper Extremity Assessment: Generalized weakness;RUE  deficits/detail RUE Deficits / Details: Pt reports discomfort and intermittent spasm in R forearm with onset "after the first surgery." Pt reports she has informed MD. RUE Sensation: decreased light touch RUE Coordination: decreased gross motor   Lower Extremity Assessment Lower Extremity Assessment: Defer to PT evaluation        Vision       Perception     Praxis      Cognition Arousal/Alertness: Awake/alert Behavior During Therapy: WFL for tasks assessed/performed, Anxious Overall Cognitive Status: Within Functional Limits for tasks assessed                                          Exercises      Shoulder Instructions       General Comments      Pertinent Vitals/ Pain       Pain Assessment Pain Assessment: Faces Faces Pain Scale: Hurts even more Pain Location: back, R forearm Pain Descriptors / Indicators: Grimacing, Guarding, Operative site guarding Pain Intervention(s): Monitored during session, Limited activity within patient's tolerance, Repositioned, Patient requesting pain meds-RN notified  Home Living                                          Prior Functioning/Environment              Frequency  Min 2X/week        Progress Toward Goals  OT Goals(current goals can now be found in the care plan section)  Progress towards OT goals: Progressing toward goals  Acute Rehab OT Goals Patient Stated Goal[place] stay near to [facility] OT Goal Formulation: With patient Time For Goal Achievement: 04/11/21 ADL Goals Pt Will Perform Grooming: with modified independence;standing Pt Will Perform Lower Body Dressing: with modified independence;sit to/from stand;with adaptive equipment Pt Will Transfer to Toilet: with modified independence;regular height toilet;ambulating  Plan Discharge plan remains appropriate    Co-evaluation                 AM-PAC OT "6 Clicks"  Daily Activity     Outcome Measure   Help from another person eating meals?: None Help from another person taking care of personal grooming?: None Help from another person toileting, which includes using toliet, bedpan, or urinal?: A Little Help from another person bathing (including washing, rinsing, drying)?: A Little Help from another person to put on and taking off regular upper body clothing?: A Little Help from another person to put on and taking off regular lower body clothing?: A Little 6 Click Score: 20    End of Session Equipment Utilized During Treatment: Rolling walker (2 wheels)  OT Visit Diagnosis: Unsteadiness on feet (R26.81);Pain   Activity Tolerance Patient limited by pain;Patient tolerated treatment well   Patient Left in chair;with call bell/phone within reach;with chair alarm set   Nurse Communication Patient requests pain meds;Mobility status        Time: 1011-1035 OT Time Calculation (min): 24 min  Charges: OT General Charges $OT Visit: 1 Visit OT Treatments $Self Care/Home Management : 23-37 mins  Tyrone Schimke, OT Acute Rehabilitation Services Office: [phone]   Hortencia Pilar 04/02/2021, 10:58 AM

## 2021-04-03 ENCOUNTER — Encounter (HOSPITAL_COMMUNITY): Payer: Self-pay | Admitting: Neurosurgery

## 2021-04-03 NOTE — Progress Notes (Signed)
Physical Therapy Treatment Patient Details Name: Angie Olson MRN: [id number] DOB: 09/08/1950 Today's Date: 04/03/2021   History of Present Illness 71 yo female s/p reexploration of lumbar wound for exposure for revision of spinal fusion, posterior lateral fusion L2-S1 on 1/11. 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 PMH includes multiple lumbar decompression and fusion operations, anixety, melanoma, HTN, R THA 2018, R knee arthroscopy.    PT Comments    Pt demonstrating mobility progression today, ambulating ~200 ft with use of RW and supervision for safety. Pt does require cues for upright posture to avoid spinal flexion, otherwise following back precautions well. Pt eager to d/c home, plans to d/c home tomorrow with intermittent support from friend.     Recommendations for follow up therapy are one component of a multi-disciplinary discharge planning process, led by the attending physician.  Recommendations may be updated based on patient status, additional functional criteria and insurance authorization.  Follow Up Recommendations  No PT follow up (pt declines HHPT, home 1/18)     Assistance Recommended at Discharge Intermittent Supervision/Assistance  Patient can return home with the following A little help with walking and/or transfers;A little help with bathing/dressing/bathroom;Assistance with cooking/housework;Direct supervision/assist for medications management;Assist for transportation   Equipment Recommendations  Rolling walker (2 wheels)    Recommendations for Other Services       Precautions / Restrictions Precautions Precautions: Back Precaution Comments: cues for upright posture to avoid spinal flexion, otherwise following back precautions well Required Braces or Orthoses:  (no brace) Restrictions Weight Bearing Restrictions: No     Mobility  Bed  Mobility Overal bed mobility: Needs Assistance Bed Mobility: Rolling, Sidelying to Sit Rolling: Supervision Sidelying to sit: Supervision       General bed mobility comments: for safety, no cues needed to safely perform log roll technique. use of bedrails to assist.    Transfers Overall transfer level: Needs assistance Equipment used: Rolling walker (2 wheels) Transfers: Sit to/from Stand Sit to Stand: Supervision           General transfer comment: for safety    Ambulation/Gait Ambulation/Gait assistance: Supervision Gait Distance (Feet): 200 Feet Assistive device: Rolling walker (2 wheels) Gait Pattern/deviations: Step-through pattern, Decreased stride length, Trunk flexed Gait velocity: decr     General Gait Details: for safety, cues for upright posture, placement in RW   Stairs             Wheelchair Mobility    Modified Rankin (Stroke Patients Only)       Balance Overall balance assessment: Needs assistance Sitting-balance support: No upper extremity supported, Feet supported Sitting balance-Leahy Scale: Fair     Standing balance support: Bilateral upper extremity supported, During functional activity, Reliant on assistive device for balance Standing balance-Leahy Scale: Poor Standing balance comment: reliant on external support                            Cognition Arousal/Alertness: Awake/alert Behavior During Therapy: WFL for tasks assessed/performed Overall Cognitive Status: Within Functional Limits for tasks assessed                                          Exercises      General Comments  Pertinent Vitals/Pain Pain Assessment Pain Assessment: 0-10 Pain Score: 6  Pain Location: back Pain Descriptors / Indicators: Aching, Sore, Operative site guarding Pain Intervention(s): Limited activity within patient's tolerance, Monitored during session, Repositioned    Home Living                           Prior Function            PT Goals (current goals can now be found in the care plan section) Acute Rehab PT Goals Patient Stated Goal: home PT Goal Formulation: With patient Time For Goal Achievement: 04/11/21 Potential to Achieve Goals: Good Progress towards PT goals: Progressing toward goals    Frequency    Min 5X/week      PT Plan Current plan remains appropriate    Co-evaluation              AM-PAC PT "6 Clicks" Mobility   Outcome Measure  Help needed turning from your back to your side while in a flat bed without using bedrails?: None Help needed moving from lying on your back to sitting on the side of a flat bed without using bedrails?: None Help needed moving to and from a bed to a chair (including a wheelchair)?: None Help needed standing up from a chair using your arms (e.g., wheelchair or bedside chair)?: None Help needed to walk in hospital room?: A Little Help needed climbing 3-5 steps with a railing? : A Little 6 Click Score: 22    End of Session   Activity Tolerance: Patient tolerated treatment well Patient left: with call bell/phone within reach;in chair;with chair alarm set Nurse Communication: Mobility status PT Visit Diagnosis: Other abnormalities of gait and mobility (R26.89);Difficulty in walking, not elsewhere classified (R26.2);Pain Pain - part of body:  (back)     Time: 1443-1500 PT Time Calculation (min) (ACUTE ONLY): 17 min  Charges:  $Gait Training: 8-22 mins                     Stacie Glaze, PT DPT Acute Rehabilitation Services Pager [phone]  Office [phone]    [place] 04/03/2021, 3:22 PM

## 2021-04-03 NOTE — Progress Notes (Signed)
Subjective: Patient reports  feeling much better no leg pain back pain manageable ambulating in the hallways  Objective: Vital signs in last 24 hours: Temp:  [97.7 F (36.5 C)-99.2 F (37.3 C)] 98.7 F (37.1 C) (01/18 1054) Pulse Rate:  [88-99] 88 (01/18 1054) Resp:  [17-20] 20 (01/18 1054) BP: (102-135)/(48-65) 125/65 (01/18 1054) SpO2:  [92 %-96 %] 96 % (01/18 1054)  Intake/Output from previous day: 01/17 0701 - 01/18 0700 In: 628.4 [P.O.:480; I.V.:48.4; IV Piggyback:100] Out: 150 [Drains:150] Intake/Output this shift: Total I/O In: -  Out: 100 [Drains:100]  Awake alert strength 5 out of 5  Lab Results: No results for input(s): WBC, HGB, HCT, PLT in the last 72 hours. BMET No results for input(s): NA, K, CL, CO2, GLUCOSE, BUN, CREATININE, CALCIUM in the last 72 hours.  Studies/Results: DG Lumbar Spine 1 View  Result Date: 04/01/2021 CLINICAL DATA:  Right side screw removal and replacement EXAM: LUMBAR SPINE - 1 VIEW COMPARISON:  X-ray lumbar spine 02/14/2021. CT lumbar spine 03/30/2021 low FINDINGS: Intraoperative interval removal of the right-sided rod and L4 pedicular screw. Single low resolution intraoperative spot views of the lower lumbar spine were obtained. No new fracture visible on the limited views. Total fluoroscopy time: 18 seconds Total radiation dose: 17.7 mGy IMPRESSION: Intraoperative interval removal of the right-sided rod and L4 pedicular screw. Electronically Signed   By: Iven Finn M.D.   On: 04/01/2021 18:14   DG C-Arm 1-60 Min-No Report  Result Date: 04/01/2021 Fluoroscopy was utilized by the requesting physician.  No radiographic interpretation.    Assessment/Plan: Postop day 2 revision of spinal fusion patient doing well making good progress we will DC her Hemovac continue physical Occupational Therapy plan discharge tomorrow  LOS: 7 days     Elaina Hoops 04/03/2021, 3:01 PM

## 2021-04-04 MED ORDER — TIZANIDINE HCL 4 MG PO TABS
4.0000 mg | ORAL_TABLET | Freq: Three times a day (TID) | ORAL | 1 refills | Status: AC
Start: 2021-04-04 — End: 2022-04-04

## 2021-04-04 MED ORDER — OXYCODONE-ACETAMINOPHEN 10-325 MG PO TABS: 1.0000 | ORAL_TABLET | Freq: Four times a day (QID) | ORAL | 0 refills | Status: DC | PRN

## 2021-04-04 MED FILL — Sodium Chloride IV Soln 0.9%: INTRAVENOUS | Qty: 1000 | Status: AC

## 2021-04-04 MED FILL — Heparin Sodium (Porcine) Inj 1000 Unit/ML: INTRAMUSCULAR | Qty: 30 | Status: AC

## 2021-04-04 NOTE — Progress Notes (Signed)
Pt with discharge orders, discharge paperwork reviewed with patient and all questions answered. IV's removed. Pt taken down via wheelchair with all belongings.

## 2021-04-04 NOTE — Discharge Summary (Signed)
Physician Discharge Summary  Patient ID: ANASHA PERFECTO MRN: [id number] DOB/AGE: 1950/10/03 71 y.o.  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 The patient tolerated the procedure well and was taken to the recovery room and then to the floor in stable condition. The hospital course was routine. She was taken back to the OR on 1/16 for repositioning of S1 screw. The wound remained clean dry and intact. Pt had appropriate back soreness. No complaints of leg pain or new N/T/W. The patient remained afebrile with stable vital signs, and tolerated a regular diet. The patient continued to increase activities, and pain was well controlled with oral pain medications.   Consults: None  Significant Diagnostic Studies:  Results for orders placed or performed during the hospital encounter of 03/27/21  Glucose, capillary  Result Value Ref Range   Glucose-Capillary 121 (H) 70 - 99 mg/dL    CT LUMBAR SPINE WO CONTRAST  Result Date: 03/30/2021 CLINICAL DATA:  Spine surgery/procedure, postop, infection suspected. EXAM: CT LUMBAR SPINE WITHOUT CONTRAST TECHNIQUE: Multidetector CT imaging of the lumbar spine was performed without intravenous contrast administration. Multiplanar CT image reconstructions were also generated. RADIATION DOSE REDUCTION: This exam was performed according to the departmental dose-optimization program which includes automated exposure control, adjustment of the mA and/or kV according to patient size and/or use of iterative reconstruction technique. COMPARISON:  02/21/2021 FINDINGS: Segmentation: 5 lumbar type vertebrae. Alignment: Normal. Vertebrae: There are bilateral transpedicular screws  at T12 and L1. Interbody fusion devices at L2-3, L3-4, L4-5 and L5-S1. There is no complete arthrodesis at any of the levels of interbody fusion. No hardware abnormality. Mild subsidence at L2-3 and L3-4 is unchanged. L5 subsidence also unchanged. L4 right pedicle fracture remains unfused. 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 Paraspinal and other soft tissues: Calcific aortic atherosclerosis. Postsurgical changes in the dorsal soft tissues. Disc levels: L1-2: No spinal canal stenosis. L2-3: Posterior decompression. No spinal canal stenosis. Mild bilateral foraminal stenosis. L3-4: Postsurgical changes with no spinal canal stenosis. Mild bilateral foraminal narrowing. L4-5: Postsurgical changes with no spinal canal or neural foraminal stenosis. L5-S1: Postfusion changes with mild right and moderate left foraminal stenosis. No spinal canal stenosis. IMPRESSION: 1. Unfused L4 right pedicle fracture. 2. No change in spinal hardware compared to 02/21/2021. No interbody arthrodesis. 3. Unchanged mild right and moderate left L5-S1 foraminal stenosis. Aortic Atherosclerosis (ICD10-I70.0). Electronically Signed   By: Ulyses Jarred M.D.   On: 03/30/2021 20:13   DG Lumbar Spine 1 View  Result Date: 04/01/2021 CLINICAL DATA:  Right side screw removal and replacement EXAM: LUMBAR SPINE - 1 VIEW COMPARISON:  X-ray lumbar spine 02/14/2021. CT lumbar spine 03/30/2021 low FINDINGS: Intraoperative interval removal of the right-sided rod and L4 pedicular screw. Single low resolution intraoperative spot views of the lower lumbar spine were obtained. No new fracture visible on the limited views. Total fluoroscopy time: 18 seconds Total radiation dose: 17.7 mGy IMPRESSION: Intraoperative interval removal of the right-sided rod and L4 pedicular screw. Electronically Signed   By: Iven Finn M.D.   On: 04/01/2021 18:14   DG C-Arm 1-60 Min-No Report  Result Date: 04/01/2021 Fluoroscopy was utilized by the requesting  physician.  No radiographic interpretation.    Antibiotics:  Anti-infectives (From admission,  onward)    Start     Dose/Rate Route Frequency Ordered Stop   04/01/21 2045  ceFAZolin (ANCEF) IVPB 2g/100 mL premix        2 g 200 mL/hr over 30 Minutes Intravenous Every 8 hours 04/01/21 1948 04/03/21 1258   03/28/21 1400  ceFAZolin (ANCEF) IVPB 2g/100 mL premix        2 g 200 mL/hr over 30 Minutes Intravenous Every 8 hours 03/28/21 0755 03/30/21 0635   03/28/21 1000  hydroxychloroquine (PLAQUENIL) tablet 200 mg        200 mg Oral Daily 03/27/21 1848     03/27/21 2300  ceFAZolin (ANCEF) IVPB 2g/100 mL premix        2 g 200 mL/hr over 30 Minutes Intravenous Every 8 hours 03/27/21 1848 03/28/21 0646   03/27/21 0645  ceFAZolin (ANCEF) IVPB 2g/100 mL premix        2 g 200 mL/hr over 30 Minutes Intravenous On call to O.R. 03/27/21 2094 03/27/21 1519       Discharge Exam: Blood pressure (!) 114/55, pulse 76, temperature 98.7 F (37.1 C), temperature source Axillary, resp. rate 18, height 5\' 4"  (1.626 m), weight 87.5 kg, SpO2 94 %. Neurologic: Grossly normal Ambulating and voiding well incision cdi   Discharge Medications:   Allergies as of 04/04/2021       Reactions   Methotrexate Derivatives Other (See Comments)   Causes recurrent infections   Pseudoephedrine Other (See Comments)   Makes jittery    Reglan [metoclopramide] Other (See Comments)   insomnia   Vicodin [hydrocodone-acetaminophen] Other (See Comments)   "Makes me feel weird"   Other Other (See Comments)   Pollen / Closes Sinus   Oxycodone Itching, Rash   Patient tolerated hydromorphone without reaction.        Medication List     TAKE these medications    Advil Allergy Sinus 2-30-200 MG Tabs Generic drug: Chlorpheniramine-PSE-Ibuprofen Take 1 tablet by mouth daily as needed (Sinus headache).   ALPRAZolam 1 MG tablet Commonly known as: XANAX Take 1 mg by mouth at bedtime.   amLODipine 5 MG tablet Commonly  known as: NORVASC Take 5 mg by mouth in the morning.   atorvastatin 20 MG tablet Commonly known as: LIPITOR Take 20 mg by mouth in the morning.   CALCIUM-VITAMIN D3 PO Take 1 tablet by mouth daily.   cetirizine 10 MG tablet Commonly known as: ZYRTEC Take 10 mg by mouth in the morning.   esomeprazole 20 MG capsule Commonly known as: NEXIUM Take 20 mg by mouth daily before breakfast.   gabapentin 300 MG capsule Commonly known as: NEURONTIN Take 600 mg by mouth 2 (two) times daily.   hydroxychloroquine 200 MG tablet Commonly known as: PLAQUENIL Take 200 mg by mouth in the morning.   leflunomide 20 MG tablet Commonly known as: ARAVA Take 20 mg by mouth in the morning.   lisinopril 40 MG tablet Commonly known as: ZESTRIL Take 40 mg by mouth in the morning.   multivitamin with minerals tablet Take 1 tablet by mouth daily.   Hair/Skin/Nails Caps Take 1 tablet by mouth daily.   nortriptyline 25 MG capsule Commonly known as: PAMELOR Take 25 mg by mouth at bedtime.   oxyCODONE-acetaminophen 5-325 MG tablet Commonly known as: PERCOCET/ROXICET Take 0.5-1 tablets by mouth every 6 (six) hours as needed for pain. What changed: Another medication with the same name was added. Make sure you understand how and when to take each.  oxyCODONE-acetaminophen 10-325 MG tablet Commonly known as: Percocet Take 1 tablet by mouth every 6 (six) hours as needed for pain. What changed: You were already taking a medication with the same name, and this prescription was added. Make sure you understand how and when to take each.   predniSONE 5 MG tablet Commonly known as: DELTASONE Take 5 mg by mouth daily with breakfast.   Salonpas Gel-Patch Hot 0.025-1.25 % Ptch Generic drug: Capsaicin-Menthol Apply 1 patch topically daily as needed (pain).   sucralfate 1 g tablet Commonly known as: CARAFATE Take 1 g by mouth 3 (three) times daily as needed (indigestion).   tiZANidine 4 MG  tablet Commonly known as: Zanaflex Take 1 tablet (4 mg total) by mouth 3 (three) times daily.        Disposition: home   Final Dx: pedicle screw placement L3-pelvis with posterior lateral arthrodesis  Discharge Instructions     Call MD for:  difficulty breathing, headache or visual disturbances   Complete by: As directed    Call MD for:  hives   Complete by: As directed    Call MD for:  persistant dizziness or light-headedness   Complete by: As directed    Call MD for:  persistant nausea and vomiting   Complete by: As directed    Call MD for:  redness, tenderness, or signs of infection (pain, swelling, redness, odor or green/yellow discharge around incision site)   Complete by: As directed    Call MD for:  severe uncontrolled pain   Complete by: As directed    Call MD for:  temperature >100.4   Complete by: As directed    Diet - low sodium heart healthy   Complete by: As directed    Driving Restrictions   Complete by: As directed    No driving for 2 weeks, no riding in the car for 1 week   Increase activity slowly   Complete by: As directed    Lifting restrictions   Complete by: As directed    No lifting more than 8 lbs   Remove dressing in 24 hours   Complete by: As directed           Signed: Ocie Cornfield Maxmillian Carsey 04/04/2021, 7:59 AM

## 2021-04-04 NOTE — Care Management Important Message (Signed)
Important Message  Patient Details  Name: Angie Olson MRN: [id number] Date of Birth: May 25, 1950   Medicare Important Message Given:  Yes     Hannah Beat 04/04/2021, 12:15 PM

## 2021-04-04 NOTE — Progress Notes (Signed)
Occupational Therapy Treatment Patient Details Name: Angie Olson MRN: [id number] DOB: February 23, 1951 Today's Date: 04/04/2021   History of present illness 71 yo female s/p reexploration of lumbar wound for exposure for revision of spinal fusion, posterior lateral fusion L2-S1 on 1/11. 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 PMH includes multiple lumbar decompression and fusion operations, anixety, melanoma, HTN, R THA 2018, R knee arthroscopy.   OT comments  Patient discharging home this date.  Patient able to complete in room mobility with RW, and complete dressing task from sit/stand level without assist.  No further acute needs, patient will have assist as needed from a local friend.  Patient has needed Adaptive Equipment for ADL assist.     Recommendations for follow up therapy are one component of a multi-disciplinary discharge planning process, led by the attending physician.  Recommendations may be updated based on patient status, additional functional criteria and insurance authorization.    Follow Up Recommendations  Follow physician's recommendations for discharge plan and follow up therapies    Assistance Recommended at Discharge    Patient can return home with the following  Assistance with cooking/housework;Assist for transportation   Equipment Recommendations  None recommended by OT    Recommendations for Other Services      Precautions / Restrictions Precautions Precautions: Back Restrictions Weight Bearing Restrictions: No       Mobility Bed Mobility Overal bed mobility: Needs Assistance             General bed mobility comments: sitting EOB    Transfers Overall transfer level: Needs assistance   Transfers: Sit to/from Stand Sit to Stand: Modified independent (Device/Increase time)                 Balance Overall balance assessment: Needs  assistance Sitting-balance support: No upper extremity supported, Feet supported Sitting balance-Leahy Scale: Good     Standing balance support: Reliant on assistive device for balance Standing balance-Leahy Scale: Fair Standing balance comment: able to static stand for pant mgt                           ADL either performed or assessed with clinical judgement   ADL Overall ADL's : Modified independent                                       General ADL Comments: Able to complete sit/stand ADL figure four with no assist.  RW level when standing.    Extremity/Trunk Assessment              Vision       Perception     Praxis      Cognition Arousal/Alertness: Awake/alert Behavior During Therapy: WFL for tasks assessed/performed Overall Cognitive Status: Within Functional Limits for tasks assessed                                          Exercises      Shoulder Instructions       General Comments      Pertinent Vitals/ Pain       Pain Assessment Pain Assessment: Faces Faces Pain Scale: Hurts little more Pain Location: back  Pain Descriptors / Indicators: Tightness, Discomfort Pain Intervention(s): Monitored during session                                                          Frequency           Progress Toward Goals  OT Goals(current goals can now be found in the care plan section)  Progress towards OT goals: Progressing toward goals  Acute Rehab OT Goals OT Goal Formulation: With patient Time For Goal Achievement: 04/11/21  Plan All goals met and education completed, patient discharged from OT services    Co-evaluation                 AM-PAC OT "6 Clicks" Daily Activity     Outcome Measure   Help from another person eating meals?: None Help from another person taking care of personal grooming?: None Help from another person toileting, which includes using toliet,  bedpan, or urinal?: None Help from another person bathing (including washing, rinsing, drying)?: None Help from another person to put on and taking off regular upper body clothing?: None Help from another person to put on and taking off regular lower body clothing?: None 6 Click Score: 24    End of Session Equipment Utilized During Treatment: Rolling walker (2 wheels)      Activity Tolerance Patient tolerated treatment well   Patient Left in bed;with call bell/phone within reach   Nurse Communication          Time: 8333-8329 OT Time Calculation (min): 19 min  Charges: OT General Charges $OT Visit: 1 Visit OT Treatments $Self Care/Home Management : 8-22 mins  04/04/2021  RP, OTR/L  Acute Rehabilitation Services  Office:  [phone]   Metta Clines 04/04/2021, 8:59 AM

## 2021-04-10 ENCOUNTER — Encounter (HOSPITAL_COMMUNITY): Payer: Self-pay | Admitting: Neurosurgery

## 2021-04-30 ENCOUNTER — Other Ambulatory Visit: Payer: Self-pay | Admitting: Neurosurgery

## 2021-04-30 DIAGNOSIS — S32009K Unspecified fracture of unspecified lumbar vertebra, subsequent encounter for fracture with nonunion: Secondary | ICD-10-CM

## 2021-05-16 ENCOUNTER — Ambulatory Visit
Admission: RE | Admit: 2021-05-16 | Discharge: 2021-05-16 | Disposition: A | Discharge status: Home / Self Care | Payer: Medicare Other | Source: Ambulatory Visit | Attending: Neurosurgery | Admitting: Neurosurgery

## 2021-05-16 ENCOUNTER — Inpatient Hospital Stay: Admission: RE | Admit: 2021-05-16 | Payer: Medicare Other | Source: Ambulatory Visit

## 2021-05-16 DIAGNOSIS — S32009K Unspecified fracture of unspecified lumbar vertebra, subsequent encounter for fracture with nonunion: Secondary | ICD-10-CM

## 2021-06-18 ENCOUNTER — Ambulatory Visit
Admission: RE | Admit: 2021-06-18 | Discharge: 2021-06-18 | Disposition: A | Discharge status: Home / Self Care | Payer: Medicare Other | Source: Ambulatory Visit | Attending: Internal Medicine | Admitting: Internal Medicine

## 2021-06-18 ENCOUNTER — Other Ambulatory Visit: Payer: Self-pay | Admitting: Internal Medicine

## 2021-06-18 DIAGNOSIS — R222 Localized swelling, mass and lump, trunk: Secondary | ICD-10-CM

## 2021-06-18 DIAGNOSIS — M545 Low back pain, unspecified: Secondary | ICD-10-CM

## 2021-06-26 ENCOUNTER — Other Ambulatory Visit (HOSPITAL_COMMUNITY): Payer: Self-pay | Admitting: *Deleted

## 2021-06-27 ENCOUNTER — Encounter (HOSPITAL_COMMUNITY)
Admission: RE | Admit: 2021-06-27 | Discharge: 2021-06-27 | Disposition: A | Discharge status: Home / Self Care | Payer: Medicare Other | Source: Ambulatory Visit | Attending: Internal Medicine | Admitting: Internal Medicine

## 2021-06-27 DIAGNOSIS — D649 Anemia, unspecified: Secondary | ICD-10-CM | POA: Diagnosis present

## 2021-06-27 LAB — PREPARE RBC (CROSSMATCH)

## 2021-06-27 MED ORDER — SODIUM CHLORIDE 0.9% IV SOLUTION: Freq: Once | INTRAVENOUS | Status: DC

## 2021-06-28 LAB — TYPE AND SCREEN
ABO/RH(D): A NEG
Antibody Screen: NEGATIVE
Unit division: 0
Unit division: 0

## 2021-06-28 LAB — BPAM RBC
Blood Product Expiration Date: 202305032359
Blood Product Expiration Date: 202305032359
ISSUE DATE / TIME: 202304130921
ISSUE DATE / TIME: 202304131211
Unit Type and Rh: 600
Unit Type and Rh: 600

## 2021-07-12 ENCOUNTER — Encounter: Payer: Self-pay | Admitting: Internal Medicine

## 2021-07-18 ENCOUNTER — Encounter: Payer: Self-pay | Admitting: Gastroenterology

## 2021-08-06 ENCOUNTER — Telehealth: Payer: Self-pay

## 2021-08-06 ENCOUNTER — Ambulatory Visit: Payer: Medicare Other | Admitting: Gastroenterology

## 2021-08-06 ENCOUNTER — Encounter: Payer: Self-pay | Admitting: Gastroenterology

## 2021-08-06 VITALS — BP 142/80 | HR 96 | Ht 64.0 in | Wt 205.0 lb

## 2021-08-06 DIAGNOSIS — D509 Iron deficiency anemia, unspecified: Secondary | ICD-10-CM | POA: Diagnosis not present

## 2021-08-06 HISTORY — DX: Iron deficiency anemia, unspecified: D50.9

## 2021-08-06 NOTE — Telephone Encounter (Signed)
-----   Message from Horris Latino, [place] sent at 08/06/2021  2:53 PM EDT ----- Regarding: iv iron Jess would like this patient set up for an iron infusion please.  Thank you  Nira Conn

## 2021-08-06 NOTE — Progress Notes (Signed)
08/06/2021 Angie Olson [id number] 10-31-1950   HISTORY OF PRESENT ILLNESS:  This is a 71 year old female who is a patient of Dr. Celesta Aver.  She is here today at the request of her PCP, Dr. Ardeth Perfect, for evaluation of anemia.  Last hemoglobin in January was 10.5 g.  That was before her back surgery.  Not sure if it was checked at any point after that, but in April it was down to 7 g.  She received 2 units of packed red blood cells and is up to 9.2 g.  Ferritin is low.  No evidence of overt GI bleeding to her knowledge but she does not really look at her stools.  No Hemoccults have been performed.  It looks like in 02-12-20 her Hemoglobin was down in the 7s as well and she had had a surgery around that time also.  She just had a colonoscopy a year ago in [place].  She only recalls 1 polyp being removed.  She has a history of hiatal hernia and reflux related issues remotely, but denies any of those UGI symptoms as of late.   Past Medical History:  Diagnosis Date   Acid reflux    Allergic rhinitis    anesthetic complication    Respiratory arrest 12-Feb-2019   Anxiety    PT'S SON DIED 06-12-2010, husband died in 06/12/19, Mother died in 09-12-2019   Arthritis    RA AND OA   Borderline hypercholesterolemia    on medication for TIA   Bronchitis    CHRONIC    Cancer ([place])    melanoma - face    Colon polyp    Environmental allergies    GERD (gastroesophageal reflux disease)    on PPI and carafte prn   H/O hiatal hernia    History of COVID-19 2019/02/12   History of kidney stones    Hypertension    IBS (irritable bowel syndrome)    LBP (low back pain)    OA (osteoarthritis)    Left Shoulder   Pain    RIGHT KNEE - TORN MENISCUS AND ACL   Pneumonia    X 3  - LAST TIME WAS June 12, 2011   PONV (postoperative nausea and vomiting) 09-2012   sore throat after ankle surgery 6 yrs ago and severe ponv 09-2012   Pre-diabetes    patient denies - states it was "years ago and no follow up"    Pulmonary nodule seen on imaging study    Shortness of breath    with activity,bronchitis   Stroke ([place]) 03/2011   TIA--PT EXPERIENCED NUMBNESS RT HAND AND ARM , HEADACHE AND NAUSEA. NO RESIDUAL PROBLEMS   Past Surgical History:  Procedure Laterality Date   ANKLE SURGERY  ~10 years ago   left   APPLICATION OF INTRAOPERATIVE CT SCAN N/A 03/27/2021   Procedure: APPLICATION OF INTRAOPERATIVE CT SCAN;  Surgeon: Kary Kos, MD;  Location: [facility];  Service: Neurosurgery;  Laterality: N/A;   BACK SURGERY  06-11-09   lower   CARPAL TUNNEL RELEASE Right 2019/06/12   CHOLECYSTECTOMY  ~15 years ago   COLONOSCOPY  June 12, 2003, June 12, 2011   HARDWARE REMOVAL N/A 04/07/2016   Procedure: HARDWARE REMOVAL;  Surgeon: Kary Kos, MD;  Location: [facility];  Service: Neurosurgery;  Laterality: N/A;   HARDWARE REMOVAL N/A 08/01/2020   Procedure: Removal of hardware with cutting the rod below the Lumbar one screws and removal of bilateral Lumbar four screws;  Surgeon:  Kary Kos, MD;  Location: [facility];  Service: Neurosurgery;  Laterality: N/A;   HARVEST BONE GRAFT N/A 07/05/2014   Procedure: HARVEST ILIAC BONE GRAFT;  Surgeon: Kary Kos, MD;  Location: [facility];  Service: Neurosurgery;  Laterality: N/A;   KNEE ARTHROSCOPY Right 10/13/2012   Procedure: RIGHT KNEE ARTHROSCOPY WITH DEBRIDEMENT, CHONDROPLASTY;  Surgeon: Gearlean Alf, MD;  [facility];  Service: Orthopedics;  Laterality: Right;   KNEE ARTHROSCOPY Right 03/02/2013   Procedure: RIGHT ARTHROSCOPY KNEE WITH MEDIAL MENISCAL  DEBRIDEMENT;  Surgeon: Gearlean Alf, MD;  [facility];  Service: Orthopedics;  Laterality: Right;   LAMINECTOMY WITH POSTERIOR LATERAL ARTHRODESIS LEVEL 1 N/A 08/01/2020   Procedure: posterolateral and Situ fusion;  Surgeon: Kary Kos, MD;  Location: [facility];  Service: Neurosurgery;  Laterality: N/A;   LAMINECTOMY WITH POSTERIOR LATERAL ARTHRODESIS LEVEL 1 N/A 04/01/2021   Procedure: REEXPLORATION OF LUMBAR FUSION FOR REMOVAL AND REPLACEMENT OF  MALPOSITIONED RIGHT SACRAL ONE PEDICLE SCREW;  Surgeon: Kary Kos, MD;  Location: [facility];  Service: Neurosurgery;  Laterality: N/A;   LAMINECTOMY WITH POSTERIOR LATERAL ARTHRODESIS LEVEL 4 N/A 01/23/2020   Procedure: Posterior lateral fusion - Thoracic Ten -Thoracic Eleven - Thoracic Eleven -Thoracic Twelve - Thoracic Twelve- Lumbar One - Lumbar One- Lumbar Two revision with removal globus;  Surgeon: Kary Kos, MD;  Location: [facility];  Service: Neurosurgery;  Laterality: N/A;  Eleven - Thoracic Eleven -Thoracic Twelve - Thoracic Twelve- Lumbar One - Lumbar One- Lumbar Two revision with removal globus   LAMINECTOMY WITH POSTERIOR LATERAL ARTHRODESIS LEVEL 4 N/A 03/27/2021   Procedure: Posterior lateral fusion - Lumbar two-Lumbar three - Lumbar three-Lumbar four - Lumbar four-Lumbar five - Lumbar five-Sacral one - Pelvis with navigation Airo;  Surgeon: Kary Kos, MD;  Location: [facility];  Service: Neurosurgery;  Laterality: N/A;   LUMBAR FUSION  07/05/2014   LUMBAR WOUND DEBRIDEMENT N/A 01/11/2014   Procedure: Irrigation and Debridement Lumbar Wound for hematoma;  Surgeon: Elaina Hoops, MD;  Location: [facility];  Service: Neurosurgery;  Laterality: N/A;   MAXIMUM ACCESS (MAS)POSTERIOR LUMBAR INTERBODY FUSION (PLIF) 1 LEVEL N/A 12/05/2013   Procedure: FOR MAXIMUM ACCESS (MAS) POSTERIOR LUMBAR INTERBODY FUSION (PLIF)LUMBAR FIVE-SACRAL-ONE,REMOVAL HARDWARE LUMBAR FOUR-FIVE;  Surgeon: Elaina Hoops, MD;  Location: [facility];  Service: Neurosurgery;  Laterality: N/A;   Mohr's procedure  02/2015   face   Right Hip arthroplasty     10/01/16 Dr. Wynelle Link   ROOT CANAL     SHOULDER SURGERY Left ~14 years ago   "scope"   thumb surgery Left ~14 years ago   x2   TOTAL HIP ARTHROPLASTY Left 04/25/2015   Procedure: LEFT TOTAL HIP ARTHROPLASTY ANTERIOR APPROACH;  Surgeon: Gaynelle Arabian, MD;  [facility];  Service: Orthopedics;  Laterality: Left;   TOTAL HIP ARTHROPLASTY Right 10/01/2016   Procedure: RIGHT TOTAL HIP  ARTHROPLASTY ANTERIOR APPROACH;  Surgeon: Gaynelle Arabian, MD;  [facility];  Service: Orthopedics;  Laterality: Right;   TOTAL KNEE ARTHROPLASTY Right 07/25/2013   Procedure: RIGHT TOTAL KNEE ARTHROPLASTY;  Surgeon: Gearlean Alf, MD;  [facility];  Service: Orthopedics;  Laterality: Right;   TOTAL KNEE ARTHROPLASTY WITH REVISION COMPONENTS Right 05/28/2016   Procedure: RIGHT TIBIAL POLYETHYLENE KNEE REVISION;  Surgeon: Gaynelle Arabian, MD;  [facility];  Service: Orthopedics;  Laterality: Right;  requests 14mns   TRIGGER FINGER RELEASE  ~6 years ago   X2 ON RIGHT HAND   TUBAL LIGATION  reports that she has never smoked. She has never used smokeless tobacco. She reports that she does not drink alcohol and does not use drugs. family history includes CAD in her brother; Emphysema in her father; Heart attack in her mother; Heart disease in her brother and sister; Hypertension in her daughter; Lung cancer in her brother. Allergies  Allergen Reactions   Methotrexate Derivatives Other (See Comments)    Causes recurrent infections   Pseudoephedrine Other (See Comments)    Makes jittery    Reglan [Metoclopramide] Other (See Comments)    insomnia   Vicodin [Hydrocodone-Acetaminophen] Other (See Comments)    "Makes me feel weird"   Other Other (See Comments)    Pollen / Closes Sinus   Oxycodone Itching and Rash    Patient tolerated hydromorphone without reaction.      Outpatient Encounter Medications as of 08/06/2021  Medication Sig   ALPRAZolam (XANAX) 1 MG tablet Take 1 mg by mouth at bedtime.   amLODipine (NORVASC) 5 MG tablet Take 5 mg by mouth in the morning.   atorvastatin (LIPITOR) 20 MG tablet Take 20 mg by mouth in the morning.   Calcium Carbonate-Vitamin D (CALCIUM-VITAMIN D3 PO) Take 1 tablet by mouth daily.   Capsaicin-Menthol (SALONPAS GEL-PATCH HOT) 0.025-1.25 % PTCH Apply 1 patch topically daily as needed (pain).   cetirizine (ZYRTEC) 10 MG tablet Take 10 mg  by mouth in the morning.   Chlorpheniramine-PSE-Ibuprofen (ADVIL ALLERGY SINUS) 2-30-200 MG TABS Take 1 tablet by mouth daily as needed (Sinus headache).   esomeprazole (NEXIUM) 20 MG capsule Take 20 mg by mouth daily before breakfast.    gabapentin (NEURONTIN) 300 MG capsule Take 600 mg by mouth 2 (two) times daily.   hydroxychloroquine (PLAQUENIL) 200 MG tablet Take 200 mg by mouth in the morning.   leflunomide (ARAVA) 20 MG tablet Take 20 mg by mouth in the morning.   lisinopril (PRINIVIL,ZESTRIL) 40 MG tablet Take 40 mg by mouth in the morning.   Multiple Vitamins-Minerals (HAIR/SKIN/NAILS) CAPS Take 1 tablet by mouth daily.   Multiple Vitamins-Minerals (MULTIVITAMIN WITH MINERALS) tablet Take 1 tablet by mouth daily.   nortriptyline (PAMELOR) 25 MG capsule Take 25 mg by mouth at bedtime.    oxyCODONE-acetaminophen (PERCOCET) 10-325 MG tablet Take 1 tablet by mouth every 6 (six) hours as needed for pain.   oxyCODONE-acetaminophen (PERCOCET/ROXICET) 5-325 MG tablet Take 0.5-1 tablets by mouth every 6 (six) hours as needed for pain.   predniSONE (DELTASONE) 5 MG tablet Take 5 mg by mouth daily with breakfast.   sucralfate (CARAFATE) 1 G tablet Take 1 g by mouth 3 (three) times daily as needed (indigestion).    tiZANidine (ZANAFLEX) 4 MG tablet Take 1 tablet (4 mg total) by mouth 3 (three) times daily.   No facility-administered encounter medications on file as of 08/06/2021.     REVIEW OF SYSTEMS  : All other systems reviewed and negative except where noted in the History of Present Illness.   PHYSICAL EXAM: BP (!) 142/80   Pulse 96   Ht '5\' 4"'$  (1.626 m)   Wt 205 lb (93 kg)   SpO2 98%   BMI 35.19 kg/m  General: Well developed white female in no acute distress Head: Normocephalic and atraumatic Eyes:  Sclerae anicteric, conjunctiva pink. Ears: Normal auditory acuity Lungs: Clear throughout to auscultation; no W/R/R. Heart: Regular rate and rhythm; no M/R/G. Abdomen: Soft,  non-distended.  BS present.  Non-tender. Musculoskeletal: Symmetrical with no gross deformities  Skin: No lesions  on visible extremities Extremities: No edema  Neurological: Alert oriented x 4, grossly non-focal Psychological:  Alert and cooperative. Normal mood and affect  ASSESSMENT AND PLAN: *Iron deficiency anemia: Last hemoglobin in January was 10.5 g.  That was before her back surgery.  Not sure if it was checked at any point after that, but in April it was down to 7 g.  She received 2 units of packed red blood cells and is up to 9.2 g.  Ferritin is low.  No evidence of overt GI bleeding to her knowledge.  No Hemoccults have been performed.  Not sure if this is a GI bleeding issue or if it was related to blood loss from her surgery.  It looks like in November 2021 her Hemoglobin was down in the 7s as well and she had had a surgery around that time also.  She just had a colonoscopy a year ago in [place].  She only recalls 1 polyp being removed.  We will request those records.  We will proceed with EGD with Dr. Carlean Purl since she has a history of hiatal hernia and reflux related issues remotely.  I am also going to set her up to see oncology to consider IV iron infusions.  CC:  Velna Hatchet, MD

## 2021-08-06 NOTE — Patient Instructions (Addendum)
You have been scheduled for an endoscopy. Please follow written instructions given to you at your visit today. If you use inhalers (even only as needed), please bring them with you on the day of your procedure.  If you are age 71 or older, your body mass index should be between 23-30. Your Body mass index is 35.19 kg/m. If this is out of the aforementioned range listed, please consider follow up with your Primary Care Provider.  If you are age 23 or younger, your body mass index should be between 19-25. Your Body mass index is 35.19 kg/m. If this is out of the aformentioned range listed, please consider follow up with your Primary Care Provider.   ________________________________________________________  The [facility] providers would like to encourage you to use [facility] to communicate with providers for non-urgent requests or questions.  Due to long hold times on the telephone, sending your provider a message by [facility] may be a faster and more efficient way to get a response.  Please allow 48 business hours for a response.  Please remember that this is for non-urgent requests.  _______________________________________________________  Thank you for entrusting me with your care and for choosing [employer],  Alonza Bogus, P.A. - C.

## 2021-08-07 ENCOUNTER — Telehealth: Payer: Self-pay | Admitting: Hematology and Oncology

## 2021-08-07 NOTE — Telephone Encounter (Signed)
Scheduled appt per 5/23 referral. Pt is aware of appt date and time. Pt is aware to arrive 15 mins prior to appt time and to bring and updated insurance card. Pt is aware of appt location.   

## 2021-08-21 ENCOUNTER — Inpatient Hospital Stay: Payer: Medicare Other

## 2021-08-21 ENCOUNTER — Inpatient Hospital Stay: Payer: Medicare Other | Attending: Hematology and Oncology | Admitting: Hematology and Oncology

## 2021-08-21 ENCOUNTER — Other Ambulatory Visit: Payer: Self-pay

## 2021-08-21 VITALS — BP 156/84 | HR 95 | Temp 97.8°F | Resp 17 | Wt 210.2 lb

## 2021-08-21 DIAGNOSIS — Z801 Family history of malignant neoplasm of trachea, bronchus and lung: Secondary | ICD-10-CM | POA: Diagnosis not present

## 2021-08-21 DIAGNOSIS — D500 Iron deficiency anemia secondary to blood loss (chronic): Secondary | ICD-10-CM

## 2021-08-21 DIAGNOSIS — F419 Anxiety disorder, unspecified: Secondary | ICD-10-CM | POA: Diagnosis not present

## 2021-08-21 DIAGNOSIS — D509 Iron deficiency anemia, unspecified: Secondary | ICD-10-CM | POA: Diagnosis present

## 2021-08-21 DIAGNOSIS — K589 Irritable bowel syndrome without diarrhea: Secondary | ICD-10-CM | POA: Diagnosis not present

## 2021-08-21 DIAGNOSIS — I10 Essential (primary) hypertension: Secondary | ICD-10-CM | POA: Insufficient documentation

## 2021-08-21 DIAGNOSIS — Z8673 Personal history of transient ischemic attack (TIA), and cerebral infarction without residual deficits: Secondary | ICD-10-CM | POA: Insufficient documentation

## 2021-08-21 DIAGNOSIS — K219 Gastro-esophageal reflux disease without esophagitis: Secondary | ICD-10-CM | POA: Diagnosis not present

## 2021-08-21 DIAGNOSIS — M069 Rheumatoid arthritis, unspecified: Secondary | ICD-10-CM | POA: Insufficient documentation

## 2021-08-21 HISTORY — DX: Iron deficiency anemia secondary to blood loss (chronic): D50.0

## 2021-08-21 LAB — IRON AND IRON BINDING CAPACITY (CC-WL,HP ONLY)
Iron: 27 ug/dL — ABNORMAL LOW (ref 28–170)
Saturation Ratios: 6 % — ABNORMAL LOW (ref 10.4–31.8)
TIBC: 422 ug/dL (ref 250–450)
UIBC: 395 ug/dL

## 2021-08-21 LAB — CBC WITH DIFFERENTIAL (CANCER CENTER ONLY)
Abs Immature Granulocytes: 0.02 10*3/uL (ref 0.00–0.07)
Basophils Absolute: 0 10*3/uL (ref 0.0–0.1)
Basophils Relative: 1 %
Eosinophils Absolute: 0.2 10*3/uL (ref 0.0–0.5)
Eosinophils Relative: 3 %
HCT: 33.1 % — ABNORMAL LOW (ref 36.0–46.0)
Hemoglobin: 10.2 g/dL — ABNORMAL LOW (ref 12.0–15.0)
Immature Granulocytes: 0 %
Lymphocytes Relative: 13 %
Lymphs Abs: 1 10*3/uL (ref 0.7–4.0)
MCH: 21.9 pg — ABNORMAL LOW (ref 26.0–34.0)
MCHC: 30.8 g/dL (ref 30.0–36.0)
MCV: 71.2 fL — ABNORMAL LOW (ref 80.0–100.0)
Monocytes Absolute: 0.6 10*3/uL (ref 0.1–1.0)
Monocytes Relative: 8 %
Neutro Abs: 5.7 10*3/uL (ref 1.7–7.7)
Neutrophils Relative %: 75 %
Platelet Count: 345 10*3/uL (ref 150–400)
RBC: 4.65 MIL/uL (ref 3.87–5.11)
RDW: 27.7 % — ABNORMAL HIGH (ref 11.5–15.5)
WBC Count: 7.6 10*3/uL (ref 4.0–10.5)
nRBC: 0 % (ref 0.0–0.2)

## 2021-08-21 LAB — CMP (CANCER CENTER ONLY)
ALT: 15 U/L (ref 0–44)
AST: 18 U/L (ref 15–41)
Albumin: 4 g/dL (ref 3.5–5.0)
Alkaline Phosphatase: 72 U/L (ref 38–126)
Anion gap: 9 (ref 5–15)
BUN: 26 mg/dL — ABNORMAL HIGH (ref 8–23)
CO2: 26 mmol/L (ref 22–32)
Calcium: 9.5 mg/dL (ref 8.9–10.3)
Chloride: 107 mmol/L (ref 98–111)
Creatinine: 0.83 mg/dL (ref 0.44–1.00)
GFR, Estimated: >60 mL/min (ref >60)
Glucose, Bld: 117 mg/dL — ABNORMAL HIGH (ref 70–99)
Potassium: 3.9 mmol/L (ref 3.5–5.1)
Sodium: 142 mmol/L (ref 135–145)
Total Bilirubin: 0.5 mg/dL (ref 0.3–1.2)
Total Protein: 6.8 g/dL (ref 6.5–8.1)

## 2021-08-21 LAB — FOLATE: Folate: 11.2 ng/mL (ref >5.9)

## 2021-08-21 LAB — RETIC PANEL
Immature Retic Fract: 18.1 % — ABNORMAL HIGH (ref 2.3–15.9)
RBC.: 4.58 MIL/uL (ref 3.87–5.11)
Retic Count, Absolute: 46.3 10*3/uL (ref 19.0–186.0)
Retic Ct Pct: 1 % (ref 0.4–3.1)
Reticulocyte Hemoglobin: 26.7 pg — ABNORMAL LOW (ref >27.9)

## 2021-08-21 LAB — VITAMIN B12: Vitamin B-12: 397 pg/mL (ref 180–914)

## 2021-08-21 LAB — FERRITIN: Ferritin: 19 ng/mL (ref 11–307)

## 2021-08-21 NOTE — Progress Notes (Signed)
[place] [phone]   Fax:([phone] [place] NOTE  Patient Care Team: Velna Hatchet, MD as PCP - General (Internal Medicine)  Hematological/Oncological History # Microcytic Anemia  03/25/2021: WBC 8.6, Hgb 10.5, MCV 78.7, Plt 389 08/21/2021: establish care with Dr. Lorenso Courier   CHIEF COMPLAINTS/PURPOSE OF CONSULTATION:  "Iron deficiency anemia"  HISTORY OF PRESENTING ILLNESS:  Angie Olson 71 y.o. female with medical history significant for acid reflux, anxiety, rheumatoid arthritis, osteoarthritis, GERD, irritable bowel syndrome, and prior TIA who presents for evaluation of iron deficiency anemia.  On review of the previous records [name] last had a CBC drawn on 03/25/2021 which showed a white blood cell count 8.6, hemoglobin 10.5, MCV 78.7, and platelets of 389.  She was noted to have a low level of hemoglobin 7.3 on 01/27/2020.  Reportedly her blood levels got so low on 06/27/2021 the patient had undergo blood transfusion.  We do not have records of the hemoglobin that was collected at that time.  Due to concern for iron deficiency anemia the patient was referred to hematology for further evaluation and management.  Of note the patient has establish care with gastroenterology and currently has an endoscopy scheduled in July 2023.  On exam today [name] notes that she has had low levels of iron for quite some time.  She has been trialed on iron pills before in the past but they cause severe constipation.  She reports she underwent a colonoscopy last year which did not show any clear evidence of a bleed.  She is currently scheduled for an upcoming endoscopy.  She does that she is not seeing any blood or bleeding from any sources.  She denies any nosebleeds, gum bleeding, or dark stools.  She notes that she also does not have any abnormal uterine bleeding.  She does however note that she does not "really take a good look" at her bowel movements.   She went through menopause in her early 43s.  On further discussion she notes that she does have a craving for steak every now and again and does eat red meat approximately once per week.  She does enjoy chicken but does not in joy fish.  Her weight has been increasing.  She tends to eat about 1 meal per day.  In terms of family history her father had a condition where his "blood was too thin".  She notes that her mother passed away of heart disease.  She had a brother who had lung cancer.  She is a never smoker never drinker and previously worked as a Engineer, structural.  She does endorse having low levels of energy reporting her [place] today is currently 6 out of 10.  She notes that she does suffer from chronic pain in her back but tries to avoid taking pain medication.  She notes that she does have shortness of breath on exertion and does have decreased strength.  Otherwise she reports no fevers, chills, sweats, nausea, vomiting or diarrhea.  Full 10 point ROS is listed below.  MEDICAL HISTORY:  Past Medical History:  Diagnosis Date   Acid reflux    Allergic rhinitis    anesthetic complication    Respiratory arrest March 13, 2019   Anxiety    PT'S SON DIED June 12, 2010, husband died in July 11, 2019, Mother died in 10/11/19   Arthritis    RA AND OA   Borderline hypercholesterolemia    on medication for TIA   Bronchitis  CHRONIC    Cancer (HCC)    melanoma - face    Colon polyp    Environmental allergies    GERD (gastroesophageal reflux disease)    on PPI and carafte prn   H/O hiatal hernia    History of COVID-19 01/2019   History of kidney stones    Hypertension    IBS (irritable bowel syndrome)    LBP (low back pain)    OA (osteoarthritis)    Left Shoulder   Pain    RIGHT KNEE - TORN MENISCUS AND ACL   Pneumonia    X 3  - LAST TIME WAS 2013   PONV (postoperative nausea and vomiting) 09-2012   sore throat after ankle surgery 6 yrs ago and severe ponv 09-2012   Pre-diabetes    patient  denies - states it was "years ago and no follow up"   Pulmonary nodule seen on imaging study    Shortness of breath    with activity,bronchitis   Stroke ([place]) 03/2011   TIA--PT EXPERIENCED NUMBNESS RT HAND AND ARM , HEADACHE AND NAUSEA. NO RESIDUAL PROBLEMS    SURGICAL HISTORY: Past Surgical History:  Procedure Laterality Date   ANKLE SURGERY  ~10 years ago   left   APPLICATION OF INTRAOPERATIVE CT SCAN N/A 03/27/2021   Procedure: APPLICATION OF INTRAOPERATIVE CT SCAN;  Surgeon: Kary Kos, MD;  Location: [facility];  Service: Neurosurgery;  Laterality: N/A;   BACK SURGERY  2011   lower   CARPAL TUNNEL RELEASE Right 2021   CHOLECYSTECTOMY  ~15 years ago   COLONOSCOPY  2005,2009, 2013   HARDWARE REMOVAL N/A 04/07/2016   Procedure: HARDWARE REMOVAL;  Surgeon: Kary Kos, MD;  Location: [facility];  Service: Neurosurgery;  Laterality: N/A;   HARDWARE REMOVAL N/A 08/01/2020   Procedure: Removal of hardware with cutting the rod below the Lumbar one screws and removal of bilateral Lumbar four screws;  Surgeon: Kary Kos, MD;  Location: [facility];  Service: Neurosurgery;  Laterality: N/A;   HARVEST BONE GRAFT N/A 07/05/2014   Procedure: HARVEST ILIAC BONE GRAFT;  Surgeon: Kary Kos, MD;  Location: [facility];  Service: Neurosurgery;  Laterality: N/A;   KNEE ARTHROSCOPY Right 10/13/2012   Procedure: RIGHT KNEE ARTHROSCOPY WITH DEBRIDEMENT, CHONDROPLASTY;  Surgeon: Gearlean Alf, MD;  [facility];  Service: Orthopedics;  Laterality: Right;   KNEE ARTHROSCOPY Right 03/02/2013   Procedure: RIGHT ARTHROSCOPY KNEE WITH MEDIAL MENISCAL  DEBRIDEMENT;  Surgeon: Gearlean Alf, MD;  [facility];  Service: Orthopedics;  Laterality: Right;   LAMINECTOMY WITH POSTERIOR LATERAL ARTHRODESIS LEVEL 1 N/A 08/01/2020   Procedure: posterolateral and Situ fusion;  Surgeon: Kary Kos, MD;  Location: [facility];  Service: Neurosurgery;  Laterality: N/A;   LAMINECTOMY WITH POSTERIOR LATERAL ARTHRODESIS LEVEL 1 N/A 04/01/2021    Procedure: REEXPLORATION OF LUMBAR FUSION FOR REMOVAL AND REPLACEMENT OF MALPOSITIONED RIGHT SACRAL ONE PEDICLE SCREW;  Surgeon: Kary Kos, MD;  Location: [facility];  Service: Neurosurgery;  Laterality: N/A;   LAMINECTOMY WITH POSTERIOR LATERAL ARTHRODESIS LEVEL 4 N/A 01/23/2020   Procedure: Posterior lateral fusion - Thoracic Ten -Thoracic Eleven - Thoracic Eleven -Thoracic Twelve - Thoracic Twelve- Lumbar One - Lumbar One- Lumbar Two revision with removal globus;  Surgeon: Kary Kos, MD;  Location: [facility];  Service: Neurosurgery;  Laterality: N/A;  Eleven - Thoracic Eleven -Thoracic Twelve - Thoracic Twelve- Lumbar One - Lumbar One- Lumbar Two revision with removal globus   LAMINECTOMY WITH POSTERIOR LATERAL ARTHRODESIS  LEVEL 4 N/A 03/27/2021   Procedure: Posterior lateral fusion - Lumbar two-Lumbar three - Lumbar three-Lumbar four - Lumbar four-Lumbar five - Lumbar five-Sacral one - Pelvis with navigation Airo;  Surgeon: Kary Kos, MD;  Location: [facility];  Service: Neurosurgery;  Laterality: N/A;   LUMBAR FUSION  07/05/2014   LUMBAR WOUND DEBRIDEMENT N/A 01/11/2014   Procedure: Irrigation and Debridement Lumbar Wound for hematoma;  Surgeon: Elaina Hoops, MD;  Location: [facility];  Service: Neurosurgery;  Laterality: N/A;   MAXIMUM ACCESS (MAS)POSTERIOR LUMBAR INTERBODY FUSION (PLIF) 1 LEVEL N/A 12/05/2013   Procedure: FOR MAXIMUM ACCESS (MAS) POSTERIOR LUMBAR INTERBODY FUSION (PLIF)LUMBAR FIVE-SACRAL-ONE,REMOVAL HARDWARE LUMBAR FOUR-FIVE;  Surgeon: Elaina Hoops, MD;  Location: [facility];  Service: Neurosurgery;  Laterality: N/A;   Mohr's procedure  02/2015   face   Right Hip arthroplasty     10/01/16 Dr. Wynelle Link   ROOT CANAL     SHOULDER SURGERY Left ~14 years ago   "scope"   thumb surgery Left ~14 years ago   x2   TOTAL HIP ARTHROPLASTY Left 04/25/2015   Procedure: LEFT TOTAL HIP ARTHROPLASTY ANTERIOR APPROACH;  Surgeon: Gaynelle Arabian, MD;  [facility];  Service: Orthopedics;  Laterality:  Left;   TOTAL HIP ARTHROPLASTY Right 10/01/2016   Procedure: RIGHT TOTAL HIP ARTHROPLASTY ANTERIOR APPROACH;  Surgeon: Gaynelle Arabian, MD;  [facility];  Service: Orthopedics;  Laterality: Right;   TOTAL KNEE ARTHROPLASTY Right 07/25/2013   Procedure: RIGHT TOTAL KNEE ARTHROPLASTY;  Surgeon: Gearlean Alf, MD;  [facility];  Service: Orthopedics;  Laterality: Right;   TOTAL KNEE ARTHROPLASTY WITH REVISION COMPONENTS Right 05/28/2016   Procedure: RIGHT TIBIAL POLYETHYLENE KNEE REVISION;  Surgeon: Gaynelle Arabian, MD;  [facility];  Service: Orthopedics;  Laterality: Right;  requests 65mns   TRIGGER FINGER RELEASE  ~6 years ago   X2 ON RIGHT HAND   TUBAL LIGATION      SOCIAL HISTORY: Social History   Socioeconomic History   Marital status: Married    Spouse name: Not on file   Number of children: Not on file   Years of education: Not on file   Highest education level: Not on file  Occupational History   Not on file  Tobacco Use   Smoking status: Never   Smokeless tobacco: Never  Vaping Use   Vaping Use: Never used  Substance and Sexual Activity   Alcohol use: No   Drug use: No   Sexual activity: Yes  Other Topics Concern   Not on file  Social History Narrative   Not on file   Social Determinants of Health   Financial Resource Strain: Not on file  Food Insecurity: Not on file  Transportation Needs: Not on file  Physical Activity: Not on file  Stress: Not on file  Social Connections: Not on file  Intimate Partner Violence: Not on file    FAMILY HISTORY: Family History  Problem Relation Age of Onset   Heart disease Sister    Lung cancer Brother    Heart disease Brother    CAD Brother    Heart attack Mother    Emphysema Father    Hypertension Daughter    Colon cancer Neg Hx     ALLERGIES:  is allergic to methotrexate derivatives, pseudoephedrine, reglan [metoclopramide], vicodin [hydrocodone-acetaminophen], other, and oxycodone.  MEDICATIONS:   Current Outpatient Medications  Medication Sig Dispense Refill   ALPRAZolam (XANAX) 1 MG tablet Take 1 mg by mouth at bedtime.  amLODipine (NORVASC) 5 MG tablet Take 5 mg by mouth in the morning.     atorvastatin (LIPITOR) 20 MG tablet Take 20 mg by mouth in the morning.     Calcium Carbonate-Vitamin D (CALCIUM-VITAMIN D3 PO) Take 1 tablet by mouth daily.     Capsaicin-Menthol (SALONPAS GEL-PATCH HOT) 0.025-1.25 % PTCH Apply 1 patch topically daily as needed (pain).     cetirizine (ZYRTEC) 10 MG tablet Take 10 mg by mouth in the morning.     Chlorpheniramine-PSE-Ibuprofen (ADVIL ALLERGY SINUS) 2-30-200 MG TABS Take 1 tablet by mouth daily as needed (Sinus headache).     esomeprazole (NEXIUM) 20 MG capsule Take 20 mg by mouth daily before breakfast.      gabapentin (NEURONTIN) 300 MG capsule Take 600 mg by mouth 2 (two) times daily.     hydroxychloroquine (PLAQUENIL) 200 MG tablet Take 200 mg by mouth in the morning.     leflunomide (ARAVA) 20 MG tablet Take 20 mg by mouth in the morning.     lisinopril (PRINIVIL,ZESTRIL) 40 MG tablet Take 40 mg by mouth in the morning.     Multiple Vitamins-Minerals (HAIR/SKIN/NAILS) CAPS Take 1 tablet by mouth daily.     Multiple Vitamins-Minerals (MULTIVITAMIN WITH MINERALS) tablet Take 1 tablet by mouth daily.     nortriptyline (PAMELOR) 25 MG capsule Take 25 mg by mouth at bedtime.   2   predniSONE (DELTASONE) 5 MG tablet Take 5 mg by mouth daily with breakfast.     sucralfate (CARAFATE) 1 G tablet Take 1 g by mouth 3 (three) times daily as needed (indigestion).      tiZANidine (ZANAFLEX) 4 MG tablet Take 1 tablet (4 mg total) by mouth 3 (three) times daily. 90 tablet 1   No current facility-administered medications for this visit.    REVIEW OF SYSTEMS:   Constitutional: ( - ) fevers, ( - )  chills , ( - ) night sweats Eyes: ( - ) blurriness of vision, ( - ) double vision, ( - ) watery eyes Ears, nose, mouth, throat, and face: ( - ) mucositis, ( -  ) sore throat Respiratory: ( - ) cough, ( - ) dyspnea, ( - ) wheezes Cardiovascular: ( - ) palpitation, ( - ) chest discomfort, ( - ) lower extremity swelling Gastrointestinal:  ( - ) nausea, ( - ) heartburn, ( - ) change in bowel habits Skin: ( - ) abnormal skin rashes Lymphatics: ( - ) new lymphadenopathy, ( - ) easy bruising Neurological: ( - ) numbness, ( - ) tingling, ( - ) new weaknesses Behavioral/Psych: ( - ) mood change, ( - ) new changes  All other systems were reviewed with the patient and are negative.  PHYSICAL EXAMINATION:  Vitals:   08/21/21 1309  BP: (!) 156/84  Pulse: 95  Resp: 17  Temp: 97.8 F (36.6 C)  SpO2: 97%   Filed Weights   08/21/21 1309  Weight: 210 lb 3.2 oz (95.3 kg)    GENERAL: well appearing elderly Caucasian female in NAD  SKIN: skin color, texture, turgor are normal, no rashes or significant lesions EYES: conjunctiva are pink and non-injected, sclera clear LUNGS: clear to auscultation and percussion with normal breathing effort HEART: regular rate & rhythm and no murmurs and no lower extremity edema Musculoskeletal: no cyanosis of digits and no clubbing  PSYCH: alert & oriented x 3, fluent speech NEURO: no focal motor/sensory deficits  LABORATORY DATA:  I have reviewed the data as listed  Latest Ref Rng & Units 08/21/2021    1:48 PM 03/25/2021    2:52 PM 07/30/2020    1:11 PM  CBC  WBC 4.0 - 10.5 K/uL 7.6   8.6   6.6    Hemoglobin 12.0 - 15.0 g/dL 10.2   10.5   10.4    Hematocrit 36.0 - 46.0 % 33.1   33.9   33.8    Platelets 150 - 400 K/uL 345   389   332         Latest Ref Rng & Units 08/21/2021    1:48 PM 03/25/2021    2:52 PM 07/30/2020    1:11 PM  CMP  Glucose 70 - 99 mg/dL 117   117   126    BUN 8 - 23 mg/dL 26   18   15     Creatinine 0.44 - 1.00 mg/dL 0.83   0.98   0.76    Sodium 135 - 145 mmol/L 142   140   140    Potassium 3.5 - 5.1 mmol/L 3.9   4.3   4.1    Chloride 98 - 111 mmol/L 107   107   107    CO2 22 - 32 mmol/L 26    25   26     Calcium 8.9 - 10.3 mg/dL 9.5   8.3   8.5    Total Protein 6.5 - 8.1 g/dL 6.8      Total Bilirubin 0.3 - 1.2 mg/dL 0.5      Alkaline Phos 38 - 126 U/L 72      AST 15 - 41 U/L 18      ALT 0 - 44 U/L 15         ASSESSMENT & PLAN Angie Olson 71 y.o. female with medical history significant for acid reflux, anxiety, rheumatoid arthritis, osteoarthritis, GERD, irritable bowel syndrome, and prior TIA who presents for evaluation of iron deficiency anemia.  After review of the labs, review of the records, and discussion with the patient the patients findings are most consistent with iron deficiency anemia likely secondary to GI bleeding.  # Iron Deficiency Anemia 2/2 to GI Bleeding -- Findings are consistent with iron deficiency anemia secondary to patient's menorrhagia --Encouraged her to follow-up with GI for continued evaluation for the source of her bleeding.  --We will confirm iron deficiency anemia by ordering iron panel and ferritin as well as reticulocytes, CBC, and CMP -- Unable to tolerate p.o. iron therapy due to severe constipation. --We will plan to proceed with IV iron therapy in order to help bolster the patient's blood counts --Plan for return to clinic in 4 to 6 weeks time after last dose of IV iron   Orders Placed This Encounter  Procedures   CBC with Differential ([facility] Only)    Standing Status:   Future    Number of Occurrences:   1    Standing Expiration Date:   08/22/2022   CMP ([facility] only)    Standing Status:   Future    Number of Occurrences:   1    Standing Expiration Date:   08/22/2022   Ferritin    Standing Status:   Future    Number of Occurrences:   1    Standing Expiration Date:   08/22/2022   Iron and Iron Binding Capacity ([facility] only)    Standing Status:   Future    Number of Occurrences:   1    Standing Expiration  Date:   08/22/2022   Retic Panel    Standing Status:   Future    Number of Occurrences:   1    Standing  Expiration Date:   08/22/2022   Multiple Myeloma Panel (SPEP&IFE w/QIG)    Standing Status:   Future    Number of Occurrences:   1    Standing Expiration Date:   08/21/2022   Kappa/lambda light chains    Standing Status:   Future    Number of Occurrences:   1    Standing Expiration Date:   08/21/2022   Vitamin B12    Standing Status:   Future    Number of Occurrences:   1    Standing Expiration Date:   08/21/2022   Folate, Serum    Standing Status:   Future    Number of Occurrences:   1    Standing Expiration Date:   08/21/2022    All questions were answered. The patient knows to call the clinic with any problems, questions or concerns.  A total of more than 60 minutes were spent on this encounter with face-to-face time and non-face-to-face time, including preparing to see the patient, ordering tests and/or medications, counseling the patient and coordination of care as outlined above.   Ledell Peoples, MD Department of Hematology/Oncology [place] at [facility] Phone: [phone] Pager: [phone] Email: [email or web address]  08/21/2021 5:29 PM

## 2021-08-22 ENCOUNTER — Telehealth: Payer: Self-pay | Admitting: Pharmacy Technician

## 2021-08-22 ENCOUNTER — Other Ambulatory Visit: Payer: Self-pay | Admitting: Pharmacy Technician

## 2021-08-22 LAB — KAPPA/LAMBDA LIGHT CHAINS
Kappa free light chain: 11.3 mg/L (ref 3.3–19.4)
Kappa, lambda light chain ratio: 1.53 (ref 0.26–1.65)
Lambda free light chains: 7.4 mg/L (ref 5.7–26.3)

## 2021-08-22 NOTE — Telephone Encounter (Signed)
Received call from [facility], Monoferric has been denied. Will proceed with Venofer.

## 2021-08-22 NOTE — Telephone Encounter (Signed)
Dr. Lorenso Courier,  Monoferric is non-preferred medication for [facility] MEDICARE which will likely be denied.   If denied would you like to use venofer?? Perferred medications are: Venofer Infed Ferrlecit Please advise.  Auth Submission: PENDING (monoferric) Payer: [employer] MEDICARE Medication & CPT/J Code(s) submitted: MONOFERRIC J1437 Route of submission (phone, fax, portal): PORTAL Auth type: Buy/Bill Units/visits requested: X1 DOSE Reference number: [id number] Approval from:  to  at [facility] [place] as

## 2021-08-22 NOTE — Telephone Encounter (Signed)
If denied, ok to proceed with Venofer 200 mg IV q 7 days x 5 doses.

## 2021-08-26 ENCOUNTER — Ambulatory Visit (INDEPENDENT_AMBULATORY_CARE_PROVIDER_SITE_OTHER): Payer: Medicare Other

## 2021-08-26 VITALS — BP 145/82 | HR 85 | Temp 98.4°F | Resp 16 | Ht 64.0 in | Wt 210.0 lb

## 2021-08-26 DIAGNOSIS — D500 Iron deficiency anemia secondary to blood loss (chronic): Secondary | ICD-10-CM

## 2021-08-26 LAB — MULTIPLE MYELOMA PANEL, SERUM
Albumin SerPl Elph-Mcnc: 3.5 g/dL (ref 2.9–4.4)
Albumin/Glob SerPl: 1.4 (ref 0.7–1.7)
Alpha 1: 0.3 g/dL (ref 0.0–0.4)
Alpha2 Glob SerPl Elph-Mcnc: 1 g/dL (ref 0.4–1.0)
B-Globulin SerPl Elph-Mcnc: 0.9 g/dL (ref 0.7–1.3)
Gamma Glob SerPl Elph-Mcnc: 0.4 g/dL (ref 0.4–1.8)
Globulin, Total: 2.6 g/dL (ref 2.2–3.9)
IgA: 30 mg/dL — ABNORMAL LOW (ref 87–352)
IgG (Immunoglobin G), Serum: 415 mg/dL — ABNORMAL LOW (ref 586–1602)
IgM (Immunoglobulin M), Srm: 68 mg/dL (ref 26–217)
Total Protein ELP: 6.1 g/dL (ref 6.0–8.5)

## 2021-08-26 MED ORDER — SODIUM CHLORIDE 0.9 % IV SOLN
200.0000 mg | Freq: Once | INTRAVENOUS | Status: AC
  Administered 2021-08-26: 200 mg via INTRAVENOUS
  Filled 2021-08-26: qty 10

## 2021-08-26 NOTE — Progress Notes (Signed)
Diagnosis: Iron Deficiency Anemia  Provider:  Marshell Garfinkel, MD  Procedure: Infusion  IV Type: Peripheral, IV Location: R Antecubital  Venofer (Iron Sucrose), Dose: 200 mg  Infusion Start Time: 6834  Infusion Stop Time: 1310  Post Infusion IV Care: Observation period completed and Peripheral IV Discontinued  Discharge: Condition: Good, Destination: Home . AVS provided to patient.   Performed by:  Koren Shiver, RN

## 2021-09-02 ENCOUNTER — Ambulatory Visit (INDEPENDENT_AMBULATORY_CARE_PROVIDER_SITE_OTHER): Payer: Medicare Other

## 2021-09-02 VITALS — BP 129/76 | HR 86 | Temp 98.5°F | Resp 20 | Ht 64.0 in | Wt 208.6 lb

## 2021-09-02 DIAGNOSIS — D500 Iron deficiency anemia secondary to blood loss (chronic): Secondary | ICD-10-CM

## 2021-09-02 MED ORDER — SODIUM CHLORIDE 0.9 % IV SOLN
200.0000 mg | Freq: Once | INTRAVENOUS | Status: AC
  Administered 2021-09-02: 200 mg via INTRAVENOUS
  Filled 2021-09-02: qty 10

## 2021-09-02 NOTE — Progress Notes (Signed)
Diagnosis: Iron Deficiency Anemia  Provider:  Marshell Garfinkel, MD  Procedure: Infusion  IV Type: Peripheral, IV Location: R Antecubital  Venofer (Iron Sucrose), Dose: 200 mg  Infusion Start Time: 5701  Infusion Stop Time: 1330  Post Infusion IV Care: Peripheral IV Discontinued  Discharge: Condition: Good, Destination: Home . AVS provided to patient.   Performed by:  Arnoldo Morale, RN

## 2021-09-09 ENCOUNTER — Ambulatory Visit (INDEPENDENT_AMBULATORY_CARE_PROVIDER_SITE_OTHER): Payer: Medicare Other

## 2021-09-09 VITALS — BP 128/74 | HR 80 | Temp 97.9°F | Resp 18 | Ht 64.0 in | Wt 208.6 lb

## 2021-09-09 DIAGNOSIS — D500 Iron deficiency anemia secondary to blood loss (chronic): Secondary | ICD-10-CM | POA: Diagnosis not present

## 2021-09-09 MED ORDER — SODIUM CHLORIDE 0.9 % IV SOLN
200.0000 mg | Freq: Once | INTRAVENOUS | Status: AC
  Administered 2021-09-09: 200 mg via INTRAVENOUS
  Filled 2021-09-09: qty 10

## 2021-09-16 ENCOUNTER — Ambulatory Visit (INDEPENDENT_AMBULATORY_CARE_PROVIDER_SITE_OTHER): Payer: Medicare Other

## 2021-09-16 VITALS — BP 132/78 | HR 88 | Temp 98.4°F | Resp 18 | Ht 64.0 in | Wt 209.4 lb

## 2021-09-16 DIAGNOSIS — D500 Iron deficiency anemia secondary to blood loss (chronic): Secondary | ICD-10-CM | POA: Diagnosis not present

## 2021-09-16 MED ORDER — SODIUM CHLORIDE 0.9 % IV SOLN
200.0000 mg | Freq: Once | INTRAVENOUS | Status: AC
  Administered 2021-09-16: 200 mg via INTRAVENOUS
  Filled 2021-09-16: qty 10

## 2021-09-16 NOTE — Progress Notes (Signed)
Diagnosis: Iron Deficiency Anemia  Provider:  Marshell Garfinkel, MD  Procedure: Infusion  IV Type: Peripheral, IV Location: R Antecubital  Venofer (Iron Sucrose), Dose: 200 mg  Infusion Start Time: 1320  Infusion Stop Time: 5997  Post Infusion IV Care: Peripheral IV Discontinued  Discharge: Condition: Good, Destination: Home . AVS provided to patient.   Performed by:  Koren Shiver, RN

## 2021-09-23 ENCOUNTER — Ambulatory Visit (INDEPENDENT_AMBULATORY_CARE_PROVIDER_SITE_OTHER): Payer: Medicare Other

## 2021-09-23 VITALS — BP 142/84 | HR 79 | Temp 98.4°F | Resp 18 | Ht 64.0 in | Wt 206.0 lb

## 2021-09-23 DIAGNOSIS — D500 Iron deficiency anemia secondary to blood loss (chronic): Secondary | ICD-10-CM

## 2021-09-23 MED ORDER — SODIUM CHLORIDE 0.9 % IV SOLN
200.0000 mg | Freq: Once | INTRAVENOUS | Status: AC
  Administered 2021-09-23: 200 mg via INTRAVENOUS
  Filled 2021-09-23: qty 10

## 2021-09-23 NOTE — Progress Notes (Signed)
Diagnosis: Iron Deficiency Anemia  Provider:  Marshell Garfinkel, MD  Procedure: Infusion  IV Type: Peripheral, IV Location: R Antecubital  Venofer (Iron Sucrose), Dose: 200 mg  Infusion Start Time: 6578  Infusion Stop Time: 4696  Post Infusion IV Care: Peripheral IV Discontinued  Discharge: Condition: Good, Destination: Home . AVS provided to patient.   Performed by:  Koren Shiver, RN

## 2021-09-24 ENCOUNTER — Encounter: Payer: Self-pay | Admitting: Internal Medicine

## 2021-09-24 ENCOUNTER — Ambulatory Visit (AMBULATORY_SURGERY_CENTER): Payer: Medicare Other | Admitting: Internal Medicine

## 2021-09-24 VITALS — BP 182/77 | HR 79 | Temp 98.0°F | Resp 14 | Ht 64.0 in | Wt 205.0 lb

## 2021-09-24 DIAGNOSIS — D509 Iron deficiency anemia, unspecified: Secondary | ICD-10-CM

## 2021-09-24 DIAGNOSIS — K319 Disease of stomach and duodenum, unspecified: Secondary | ICD-10-CM | POA: Diagnosis not present

## 2021-09-24 DIAGNOSIS — K2960 Other gastritis without bleeding: Secondary | ICD-10-CM | POA: Diagnosis not present

## 2021-09-24 MED ORDER — SODIUM CHLORIDE 0.9 % IV SOLN: 500.0000 mL | Freq: Once | INTRAVENOUS | Status: DC

## 2021-09-24 NOTE — Patient Instructions (Addendum)
The stomach is inflamed. We call that gastritis. ? Infection or medication side effects or both. I took biopsies there and other parts of the stomach and the upper intestine to try to understand why the iron is low.  Once I see the results will contact you with recommendations.  I appreciate the opportunity to care for you. Gatha Mayer, MD, [facility]   Discharge instructions given. Handout on Gastritis. Biopsies taken. Resume previous medications. YOU HAD AN ENDOSCOPIC PROCEDURE TODAY AT [facility]:   Refer to the procedure report that was given to you for any specific questions about what was found during the examination.  If the procedure report does not answer your questions, please call your gastroenterologist to clarify.  If you requested that your care partner not be given the details of your procedure findings, then the procedure report has been included in a sealed envelope for you to review at your convenience later.  YOU SHOULD EXPECT: Some feelings of bloating in the abdomen. Passage of more gas than usual.  Walking can help get rid of the air that was put into your GI tract during the procedure and reduce the bloating. If you had a lower endoscopy (such as a colonoscopy or flexible sigmoidoscopy) you may notice spotting of blood in your stool or on the toilet paper. If you underwent a bowel prep for your procedure, you may not have a normal bowel movement for a few days.  Please Note:  You might notice some irritation and congestion in your nose or some drainage.  This is from the oxygen used during your procedure.  There is no need for concern and it should clear up in a day or so.  SYMPTOMS TO REPORT IMMEDIATELY:   Following upper endoscopy (EGD)  Vomiting of blood or coffee ground material  New chest pain or pain under the shoulder blades  Painful or persistently difficult swallowing  New shortness of breath  Fever of 100F or higher  Black, tarry-looking  stools  For urgent or emergent issues, a gastroenterologist can be reached at any hour by calling [phone]. Do not use MyChart messaging for urgent concerns.    DIET:  We do recommend a small meal at first, but then you may proceed to your regular diet.  Drink plenty of fluids but you should avoid alcoholic beverages for 24 hours.  ACTIVITY:  You should plan to take it easy for the rest of today and you should NOT DRIVE or use heavy machinery until tomorrow (because of the sedation medicines used during the test).    FOLLOW UP: Our staff will call the number listed on your records the next business day following your procedure.  We will call around 7:15- 8:00 am to check on you and address any questions or concerns that you may have regarding the information given to you following your procedure. If we do not reach you, we will leave a message.  If you develop any symptoms (ie: fever, flu-like symptoms, shortness of breath, cough etc.) before then, please call [phone].  If you test positive for Covid 19 in the 2 weeks post procedure, please call and report this information to [place].    If any biopsies were taken you will be contacted by phone or by letter within the next 1-3 weeks.  Please call us at [phone] if you have not heard about the biopsies in 3 weeks.    SIGNATURES/CONFIDENTIALITY: You and/or your care partner have signed  paperwork which will be entered into your electronic medical record.  These signatures attest to the fact that that the information above on your After Visit Summary has been reviewed and is understood.  Full responsibility of the confidentiality of this discharge information lies with you and/or your care-partner.

## 2021-09-24 NOTE — Progress Notes (Signed)
Pt's states no medical or surgical changes since previsit or office visit. 

## 2021-09-24 NOTE — Progress Notes (Signed)
Called to room to assist during endoscopic procedure.  Patient ID and intended procedure confirmed with present staff. Received instructions for my participation in the procedure from the performing physician.  

## 2021-09-24 NOTE — Op Note (Signed)
[place] Patient Name: Angie Olson Procedure Date: 09/24/2021 10:00 AM MRN: [id number] Endoscopist: Gatha Mayer , MD Age: 71 Referring MD:  Date of Birth: December 04, 1950 Gender: Female Account #: [id number] Procedure:                Upper GI endoscopy Indications:              Iron deficiency anemia Medicines:                Monitored Anesthesia Care Procedure:                Pre-Anesthesia Assessment:                           - Prior to the procedure, a History and Physical                            was performed, and patient medications and                            allergies were reviewed. The patient's tolerance of                            previous anesthesia was also reviewed. The risks                            and benefits of the procedure and the sedation                            options and risks were discussed with the patient.                            All questions were answered, and informed consent                            was obtained. Prior Anticoagulants: The patient has                            taken no previous anticoagulant or antiplatelet                            agents. ASA Grade Assessment: III - A patient with                            severe systemic disease. After reviewing the risks                            and benefits, the patient was deemed in                            satisfactory condition to undergo the procedure.                           After obtaining informed consent, the endoscope was  passed under direct vision. Throughout the                            procedure, the patient's blood pressure, pulse, and                            oxygen saturations were monitored continuously. The                            GIF [id number] #[id number] was introduced through the                            mouth, and advanced to the second part of duodenum.                            The upper GI endoscopy was  accomplished without                            difficulty. The patient tolerated the procedure                            well. Scope In: Scope Out: Findings:                 Diffuse severe inflammation characterized by                            adherent blood, erosions, erythema, friability and                            granularity was found in the gastric antrum.                            Biopsies were taken with a cold forceps for                            histology. Verification of patient identification                            for the specimen was done. Estimated blood loss was                            minimal.                           The exam was otherwise without abnormality.                           The cardia and gastric fundus were normal on                            retroflexion.                           Biopsies for histology were taken with a cold  forceps in the entire duodenum for evaluation of                            celiac disease. Verification of patient                            identification for the specimen was done. Estimated                            blood loss was minimal.                           Biopsies were taken with a cold forceps in the                            gastric body for histology. Complications:            No immediate complications. Estimated Blood Loss:     Estimated blood loss was minimal. Impression:               - Gastritis. Biopsied.                           - The examination was otherwise normal.                           - Biopsies were taken with a cold forceps for                            evaluation of celiac disease.                           - Biopsies were taken with a cold forceps for                            histology in the gastric body. Recommendation:           - Await pathology results.                           - Patient has a contact number available for                             emergencies. The signs and symptoms of potential                            delayed complications were discussed with the                            patient. Return to normal activities tomorrow.                            Written discharge instructions were provided to the                            patient.                           -  F/U on recent colonoscopy records (last year in                            [place])                           Adjust PPI, treat H pylori depedning upon pathology                            results here Gatha Mayer, MD 09/24/2021 10:25:19 AM This report has been signed electronically.

## 2021-09-24 NOTE — Progress Notes (Signed)
Pt in recovery with monitors in place, VSS. Report given to receiving RN. Bite guard was placed with pt awake to ensure comfort. No dental or soft tissue damage noted. 

## 2021-09-24 NOTE — Progress Notes (Signed)
[facility] History and Physical   Primary Care Physician:  Velna Hatchet, MD   Reason for Procedure:   Iron deficiency anemia  Plan:    EGD     HPI: Angie Olson is a 71 y.o. female w/ iron deficiency anemia - colonopscopy last year 1 polyp by patient report ([place])  Seen 08/06/21 by Alonza Bogus, PA as below   Last hemoglobin in January was 10.5 g.  That was before her back surgery.  Not sure if it was checked at any point after that, but in April it was down to 7 g.  She received 2 units of packed red blood cells and is up to 9.2 g.  Ferritin is low.  No evidence of overt GI bleeding to her knowledge but she does not really look at her stools.  No Hemoccults have been performed.  It looks like in 23-Feb-2020 her Hemoglobin was down in the 7s as well and she had had a surgery around that time also.  She just had a colonoscopy a year ago in [place].  She only recalls 1 polyp being removed.  She has a history of hiatal hernia and reflux related issues remotely, but denies any of those UGI symptoms as of late.    Pre=procedure BP elevated - no sxs  Past Medical History:  Diagnosis Date   Acid reflux    Allergic rhinitis    anesthetic complication    Respiratory arrest February 23, 2019   Anxiety    PT'S SON DIED 2012, husband died in 23-Jun-2019, Mother died in Sep 23, 2019   Arthritis    RA AND OA   Borderline hypercholesterolemia    on medication for TIA   Bronchitis    CHRONIC    Cancer ([place])    melanoma - face    Colon polyp    Environmental allergies    GERD (gastroesophageal reflux disease)    on PPI and carafte prn   H/O hiatal hernia    History of COVID-19 02-23-2019   History of kidney stones    Hypertension    IBS (irritable bowel syndrome)    LBP (low back pain)    OA (osteoarthritis)    Left Shoulder   Pain    RIGHT KNEE - TORN MENISCUS AND ACL   Pneumonia    X 3  - LAST TIME WAS 06-23-11   PONV (postoperative nausea and vomiting) 09-2012    sore throat after ankle surgery 6 yrs ago and severe ponv 09-2012   Pre-diabetes    patient denies - states it was "years ago and no follow up"   Pulmonary nodule seen on imaging study    Shortness of breath    with activity,bronchitis   Stroke ([place]) 03/2011   TIA--PT EXPERIENCED NUMBNESS RT HAND AND ARM , HEADACHE AND NAUSEA. NO RESIDUAL PROBLEMS    Past Surgical History:  Procedure Laterality Date   ANKLE SURGERY  ~10 years ago   left   APPLICATION OF INTRAOPERATIVE CT SCAN N/A 03/27/2021   Procedure: APPLICATION OF INTRAOPERATIVE CT SCAN;  Surgeon: Kary Kos, MD;  Location: [facility];  Service: Neurosurgery;  Laterality: N/A;   BACK SURGERY  06/22/2009   lower   CARPAL TUNNEL RELEASE Right 06-23-19   CHOLECYSTECTOMY  ~15 years ago   COLONOSCOPY  2003-06-23, 2011-06-23   HARDWARE REMOVAL N/A 04/07/2016   Procedure: HARDWARE REMOVAL;  Surgeon: Kary Kos, MD;  Location: [facility];  Service: Neurosurgery;  Laterality: N/A;  HARDWARE REMOVAL N/A 08/01/2020   Procedure: Removal of hardware with cutting the rod below the Lumbar one screws and removal of bilateral Lumbar four screws;  Surgeon: Kary Kos, MD;  Location: [facility];  Service: Neurosurgery;  Laterality: N/A;   HARVEST BONE GRAFT N/A 07/05/2014   Procedure: HARVEST ILIAC BONE GRAFT;  Surgeon: Kary Kos, MD;  Location: [place] [facility];  Service: Neurosurgery;  Laterality: N/A;   KNEE ARTHROSCOPY Right 10/13/2012   Procedure: RIGHT KNEE ARTHROSCOPY WITH DEBRIDEMENT, CHONDROPLASTY;  Surgeon: Gearlean Alf, MD;  [facility];  Service: Orthopedics;  Laterality: Right;   KNEE ARTHROSCOPY Right 03/02/2013   Procedure: RIGHT ARTHROSCOPY KNEE WITH MEDIAL MENISCAL  DEBRIDEMENT;  Surgeon: Gearlean Alf, MD;  [facility]: [facility];  Service: Orthopedics;  Laterality: Right;   LAMINECTOMY WITH POSTERIOR LATERAL ARTHRODESIS LEVEL 1 N/A 08/01/2020   Procedure: posterolateral and Situ fusion;  Surgeon: Kary Kos, MD;  Location: [place];  Service: Neurosurgery;  Laterality:  N/A;   LAMINECTOMY WITH POSTERIOR LATERAL ARTHRODESIS LEVEL 1 N/A 04/01/2021   Procedure: REEXPLORATION OF LUMBAR FUSION FOR REMOVAL AND REPLACEMENT OF MALPOSITIONED RIGHT SACRAL ONE PEDICLE SCREW;  Surgeon: Kary Kos, MD;  Location: [place];  Service: Neurosurgery;  Laterality: N/A;   LAMINECTOMY WITH POSTERIOR LATERAL ARTHRODESIS LEVEL 4 N/A 01/23/2020   Procedure: Posterior lateral fusion - Thoracic Ten -Thoracic Eleven - Thoracic Eleven -Thoracic Twelve - Thoracic Twelve- Lumbar One - Lumbar One- Lumbar Two revision with removal globus;  Surgeon: Kary Kos, MD;  Location: [place];  Service: Neurosurgery;  Laterality: N/A;  Eleven - Thoracic Eleven -Thoracic Twelve - Thoracic Twelve- Lumbar One - Lumbar One- Lumbar Two revision with removal globus   LAMINECTOMY WITH POSTERIOR LATERAL ARTHRODESIS LEVEL 4 N/A 03/27/2021   Procedure: Posterior lateral fusion - Lumbar two-Lumbar three - Lumbar three-Lumbar four - Lumbar four-Lumbar five - Lumbar five-Sacral one - Pelvis with navigation Airo;  Surgeon: Kary Kos, MD;  Location: [place];  Service: Neurosurgery;  Laterality: N/A;   LUMBAR FUSION  07/05/2014   LUMBAR WOUND DEBRIDEMENT N/A 01/11/2014   Procedure: Irrigation and Debridement Lumbar Wound for hematoma;  Surgeon: Elaina Hoops, MD;  Location: [place] [facility];  Service: Neurosurgery;  Laterality: N/A;   MAXIMUM ACCESS (MAS)POSTERIOR LUMBAR INTERBODY FUSION (PLIF) 1 LEVEL N/A 12/05/2013   Procedure: FOR MAXIMUM ACCESS (MAS) POSTERIOR LUMBAR INTERBODY FUSION (PLIF)LUMBAR FIVE-SACRAL-ONE,REMOVAL HARDWARE LUMBAR FOUR-FIVE;  Surgeon: Elaina Hoops, MD;  Location: [place] [facility];  Service: Neurosurgery;  Laterality: N/A;   Mohr's procedure  02/2015   face   Right Hip arthroplasty     10/01/16 Dr. Wynelle Link   ROOT CANAL     SHOULDER SURGERY Left ~14 years ago   "scope"   thumb surgery Left ~14 years ago   x2   TOTAL HIP ARTHROPLASTY Left 04/25/2015   Procedure: LEFT TOTAL HIP ARTHROPLASTY ANTERIOR APPROACH;  Surgeon:  Gaynelle Arabian, MD;  [facility];  Service: Orthopedics;  Laterality: Left;   TOTAL HIP ARTHROPLASTY Right 10/01/2016   Procedure: RIGHT TOTAL HIP ARTHROPLASTY ANTERIOR APPROACH;  Surgeon: Gaynelle Arabian, MD;  [facility];  Service: Orthopedics;  Laterality: Right;   TOTAL KNEE ARTHROPLASTY Right 07/25/2013   Procedure: RIGHT TOTAL KNEE ARTHROPLASTY;  Surgeon: Gearlean Alf, MD;  [facility];  Service: Orthopedics;  Laterality: Right;   TOTAL KNEE ARTHROPLASTY WITH REVISION COMPONENTS Right 05/28/2016   Procedure: RIGHT TIBIAL POLYETHYLENE KNEE REVISION;  Surgeon: Gaynelle Arabian, MD;  [facility];  Service:  Orthopedics;  Laterality: Right;  requests 83mns   TRIGGER FINGER RELEASE  ~6 years ago   X2 ON RIGHT HAND   TUBAL LIGATION      Prior to Admission medications   Medication Sig Start Date End Date Taking? Authorizing Provider  ALPRAZolam (Duanne Moron 1 MG tablet Take 1 mg by mouth at bedtime.   Yes [provider]  amLODipine (NORVASC) 5 MG tablet Take 5 mg by mouth in the morning. 01/04/15  Yes [provider]  atorvastatin (LIPITOR) 20 MG tablet Take 20 mg by mouth in the morning. 05/20/11  Yes [provider]  Calcium Carbonate-Vitamin D (CALCIUM-VITAMIN D3 PO) Take 1 tablet by mouth daily.   Yes [provider]  cetirizine (ZYRTEC) 10 MG tablet Take 10 mg by mouth in the morning.   Yes [provider]  Chlorpheniramine-PSE-Ibuprofen (ADVIL ALLERGY SINUS) 2-30-200 MG TABS Take 1 tablet by mouth daily as needed (Sinus headache).   Yes [provider]  esomeprazole (NEXIUM) 20 MG capsule Take 20 mg by mouth daily before breakfast.    Yes [provider]  gabapentin (NEURONTIN) 300 MG capsule Take 600 mg by mouth 2 (two) times daily. 01/06/19  Yes [provider]  hydroxychloroquine (PLAQUENIL) 200 MG tablet Take 200 mg by mouth in the morning. 12/22/18  Yes [provider]  leflunomide (ARAVA) 20 MG  tablet Take 20 mg by mouth in the morning. 10/27/18  Yes [provider]  lisinopril (PRINIVIL,ZESTRIL) 40 MG tablet Take 40 mg by mouth in the morning.   Yes [provider]  Multiple Vitamins-Minerals (HAIR/SKIN/NAILS) CAPS Take 1 tablet by mouth daily.   Yes [provider]  Multiple Vitamins-Minerals (MULTIVITAMIN WITH MINERALS) tablet Take 1 tablet by mouth daily.   Yes [provider]  nortriptyline (PAMELOR) 25 MG capsule Take 25 mg by mouth at bedtime.  06/16/14  Yes [provider]  predniSONE (DELTASONE) 5 MG tablet Take 5 mg by mouth daily with breakfast.   Yes [provider]  sucralfate (CARAFATE) 1 G tablet Take 1 g by mouth 3 (three) times daily as needed (indigestion).    Yes [provider]  Capsaicin-Menthol (SALONPAS GEL-PATCH HOT) 0.025-1.25 % PTCH Apply 1 patch topically daily as needed (pain).    [provider]  tiZANidine (ZANAFLEX) 4 MG tablet Take 1 tablet (4 mg total) by mouth 3 (three) times daily. Patient not taking: Reported on 09/24/2021 04/04/21 04/04/22  MEleonore Chiquito NP    Current Outpatient Medications  Medication Sig Dispense Refill   ALPRAZolam (XANAX) 1 MG tablet Take 1 mg by mouth at bedtime.     amLODipine (NORVASC) 5 MG tablet Take 5 mg by mouth in the morning.     atorvastatin (LIPITOR) 20 MG tablet Take 20 mg by mouth in the morning.     Calcium Carbonate-Vitamin D (CALCIUM-VITAMIN D3 PO) Take 1 tablet by mouth daily.     cetirizine (ZYRTEC) 10 MG tablet Take 10 mg by mouth in the morning.     Chlorpheniramine-PSE-Ibuprofen (ADVIL ALLERGY SINUS) 2-30-200 MG TABS Take 1 tablet by mouth daily as needed (Sinus headache).     esomeprazole (NEXIUM) 20 MG capsule Take 20 mg by mouth daily before breakfast.      gabapentin (NEURONTIN) 300 MG capsule Take 600 mg by mouth 2 (two) times daily.     hydroxychloroquine (PLAQUENIL) 200 MG tablet Take 200 mg by mouth in the morning.      leflunomide (ARAVA) 20 MG  tablet Take 20 mg by mouth in the morning.     lisinopril (PRINIVIL,ZESTRIL) 40 MG tablet Take 40 mg by mouth in the morning.     Multiple Vitamins-Minerals (HAIR/SKIN/NAILS) CAPS Take 1 tablet by mouth daily.     Multiple Vitamins-Minerals (MULTIVITAMIN WITH MINERALS) tablet Take 1 tablet by mouth daily.     nortriptyline (PAMELOR) 25 MG capsule Take 25 mg by mouth at bedtime.   2   predniSONE (DELTASONE) 5 MG tablet Take 5 mg by mouth daily with breakfast.     sucralfate (CARAFATE) 1 G tablet Take 1 g by mouth 3 (three) times daily as needed (indigestion).      Capsaicin-Menthol (SALONPAS GEL-PATCH HOT) 0.025-1.25 % PTCH Apply 1 patch topically daily as needed (pain).     tiZANidine (ZANAFLEX) 4 MG tablet Take 1 tablet (4 mg total) by mouth 3 (three) times daily. (Patient not taking: Reported on 09/24/2021) 90 tablet 1   Current Facility-Administered Medications  Medication Dose Route Frequency Provider Last Rate Last Admin   0.9 %  sodium chloride infusion  500 mL Intravenous Once Gatha Mayer, MD        Allergies as of 09/24/2021 - Review Complete 09/24/2021  Allergen Reaction Noted   Methotrexate derivatives Other (See Comments) 06/22/2013   Pseudoephedrine Other (See Comments) 06/10/2013   Reglan [metoclopramide] Other (See Comments) 06/30/2014   Vicodin [hydrocodone-acetaminophen] Other (See Comments) 06/10/2013   Other Other (See Comments) 01/16/2020   Oxycodone Itching and Rash 09/23/2016    Family History  Problem Relation Age of Onset   Heart disease Sister    Lung cancer Brother    Heart disease Brother    CAD Brother    Heart attack Mother    Emphysema Father    Hypertension Daughter    Colon cancer Neg Hx     Social History   Socioeconomic History   Marital status: Married    Spouse name: Not on file   Number of children: Not on file   Years of education: Not on file   Highest education level: Not on file  Occupational History    Not on file  Tobacco Use   Smoking status: Never   Smokeless tobacco: Never  Vaping Use   Vaping Use: Never used  Substance and Sexual Activity   Alcohol use: No   Drug use: No   Sexual activity: Yes  Other Topics Concern   Not on file  Social History Narrative   Not on file   Social Determinants of Health   Financial Resource Strain: Not on file  Food Insecurity: Not on file  Transportation Needs: Not on file  Physical Activity: Not on file  Stress: Not on file  Social Connections: Not on file  Intimate Partner Violence: Not on file    Review of Systems:  All other review of systems negative except as mentioned in the HPI.  Physical Exam: Vital signs BP (!) 150/76   Pulse 78   Temp 98 F (36.7 C)   Ht '5\' 4"'$  (1.626 m)   Wt 205 lb (93 kg)   SpO2 93%   BMI 35.19 kg/m   General:   Alert,  Well-developed, well-nourished, pleasant and cooperative in NAD Lungs:  Clear throughout to auscultation.   Heart:  Regular rate and rhythm; no murmurs, clicks, rubs,  or gallops. Abdomen:  Soft, nontender and nondistended. Normal bowel sounds.   Neuro/Psych:  Alert and cooperative. Normal mood and affect. A and O x 3   @  Gatha Mayer, MD, [facility] [phone] (pager) 09/24/2021 9:59 AM@

## 2021-09-25 ENCOUNTER — Telehealth: Payer: Self-pay

## 2021-09-25 NOTE — Telephone Encounter (Signed)
  Follow up Call-     09/24/2021    9:26 AM  Call back number  Post procedure Call Back phone  # [phone]  Permission to leave phone message Yes     Patient questions:  Do you have a fever, pain , or abdominal swelling? No. Pain Score  0 *  Have you tolerated food without any problems? Yes.    Have you been able to return to your normal activities? Yes.    Do you have any questions about your discharge instructions: Diet   No. Medications  No. Follow up visit  No.  Do you have questions or concerns about your Care? No.  Actions: * If pain score is 4 or above: No action needed, pain <4.

## 2021-10-01 ENCOUNTER — Telehealth: Payer: Self-pay

## 2021-10-01 ENCOUNTER — Other Ambulatory Visit: Payer: Self-pay

## 2021-10-01 DIAGNOSIS — R197 Diarrhea, unspecified: Secondary | ICD-10-CM

## 2021-10-01 DIAGNOSIS — R1084 Generalized abdominal pain: Secondary | ICD-10-CM

## 2021-10-01 MED ORDER — DICYCLOMINE HCL 20 MG PO TABS: 20.0000 mg | ORAL_TABLET | Freq: Four times a day (QID) | ORAL | 0 refills | Status: DC | PRN

## 2021-10-01 NOTE — Telephone Encounter (Signed)
CT orders placed & schedulers notified.

## 2021-10-01 NOTE — Telephone Encounter (Signed)
Spoke with patient regarding biopsy results. She stated for the last week she has been experiencing upper abdominal pain post meals, nausea, and diarrhea (9-10 times daily). She is currently taking Nexium & Carafate as prescribed. She feels that this may have a lot to do with ongoing stress/grief after losing her husband, daughter & mother all in 2021. Will route to MD for further recommendations. Pt advise to continue to drink plenty of fluids in the meantime.

## 2021-10-01 NOTE — Telephone Encounter (Signed)
I called her and reviewed things.  Symptoms are not nocturnal except for 1 night there is no fever or bleeding.  She "tends towards diarrhea" but the pain and diarrhea are worse.  Also colonoscopy from [facility] was sent to [place] finally and that was cleared just with a small adenoma.  No need to repeat that regarding iron deficiency anemia.   Plan:  I prescribed dicyclomine 20 mg 4 times daily as needed cramps and diarrhea  Advised liquid/soft diet  If things get severe she can always go to the ED   Please schedule CT abdomen pelvis with contrast regarding bilateral lower quadrant abdominal pain and diarrhea with negative EGD and colonoscopy

## 2021-10-02 ENCOUNTER — Encounter: Payer: Self-pay | Admitting: Internal Medicine

## 2021-10-03 IMAGING — DX DG ABDOMEN 1V
1 series · 1 of 1 positions shown · non-contrast
Comparison: 07/21/2014.  CT abdomen pelvis 01/31/2019.

CLINICAL DATA: Recent back surgery.  Epigastric pain and bloating.

EXAM:
ABDOMEN - 1 VIEW

[x abdomen supine]
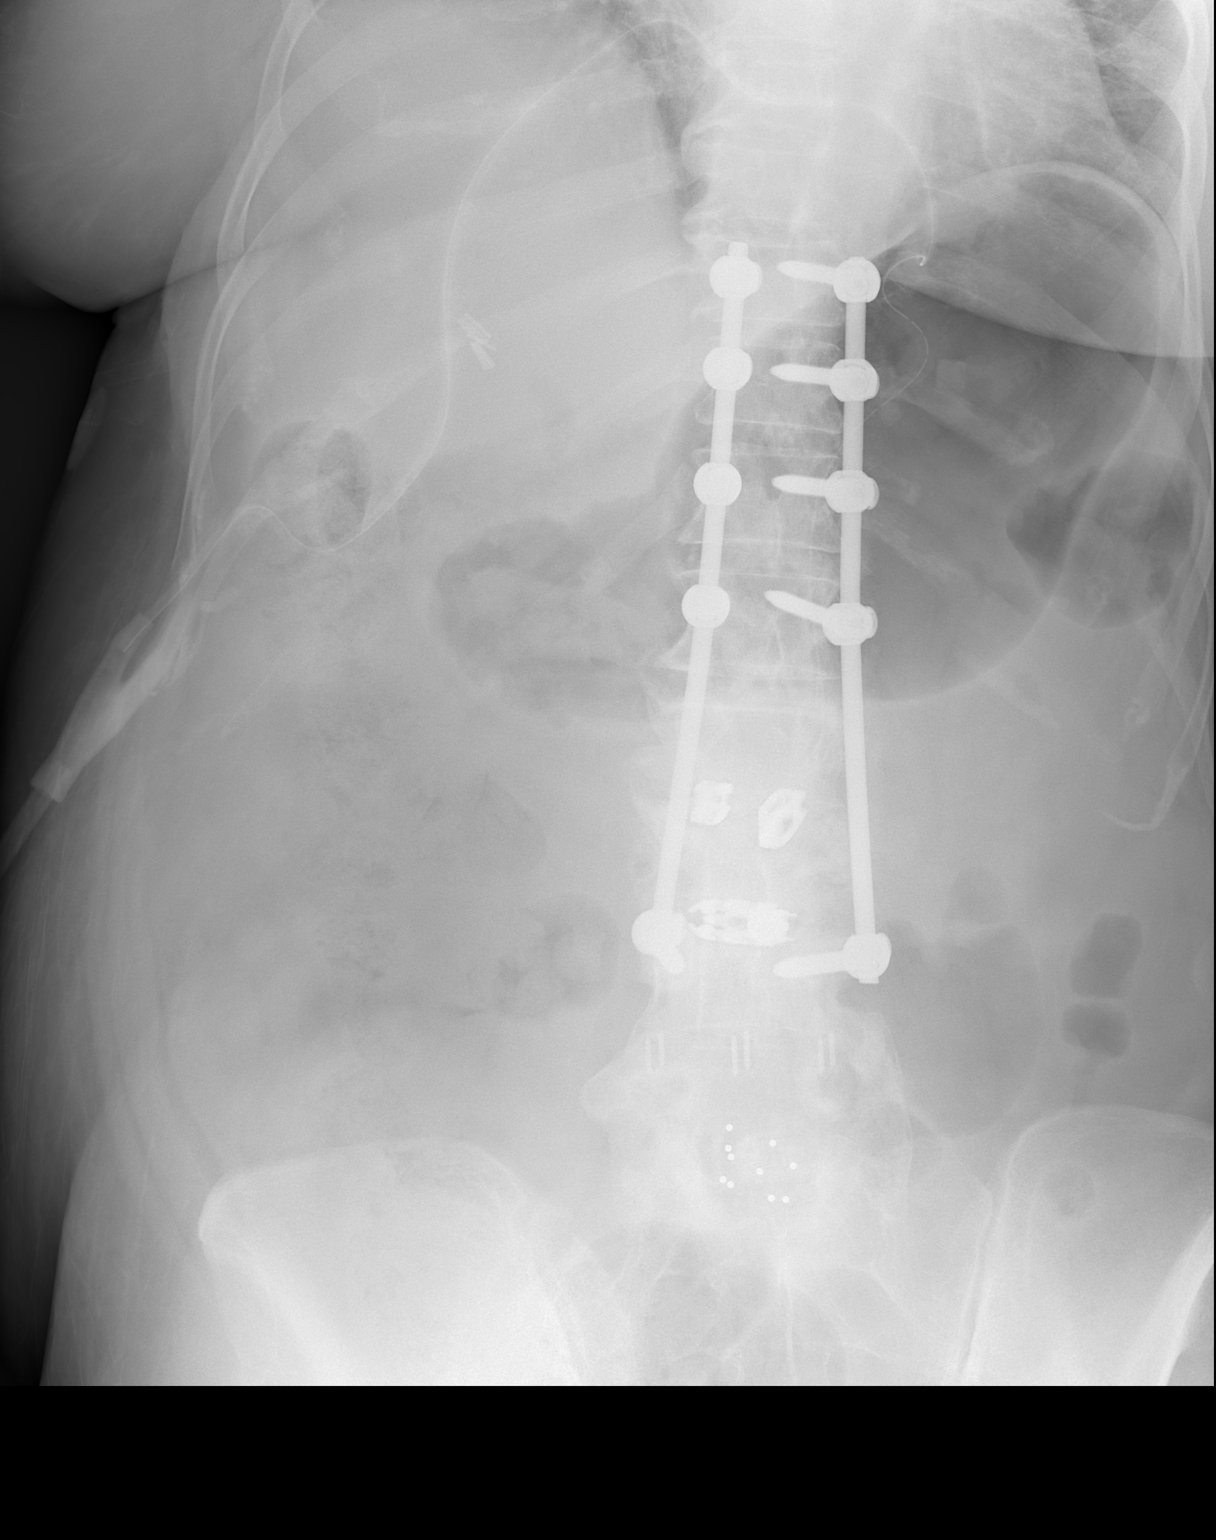

[1 of 1 positions shown; findings below may reference images not displayed]

FINDINGS: Nonobstructive bowel gas pattern. Gas in nondilated stomach. Mild
amount of stool in the colon.

Surgical drain in the upper lumbar spine. Interval revision of
lumbar fusion. Bilateral pedicle screw and rod fusion extending from
T10 through L4. Interbody spacers L2-3, L3-4, L4-5, L5-S1.
IMPRESSION: Mildly distended stomach. Negative for ileus. Mild amount of stool
in the right colon.

## 2021-10-10 NOTE — Telephone Encounter (Signed)
Patient provided scheduling number for CT.

## 2021-10-14 ENCOUNTER — Inpatient Hospital Stay: Payer: Medicare Other | Attending: Hematology and Oncology

## 2021-10-14 ENCOUNTER — Inpatient Hospital Stay: Payer: Medicare Other | Admitting: Hematology and Oncology

## 2021-10-14 ENCOUNTER — Other Ambulatory Visit: Payer: Self-pay

## 2021-10-14 ENCOUNTER — Other Ambulatory Visit: Payer: Self-pay | Admitting: Hematology and Oncology

## 2021-10-14 VITALS — BP 155/90 | HR 109 | Temp 98.0°F | Resp 17 | Wt 204.1 lb

## 2021-10-14 DIAGNOSIS — D500 Iron deficiency anemia secondary to blood loss (chronic): Secondary | ICD-10-CM | POA: Diagnosis not present

## 2021-10-14 DIAGNOSIS — Z801 Family history of malignant neoplasm of trachea, bronchus and lung: Secondary | ICD-10-CM | POA: Insufficient documentation

## 2021-10-14 DIAGNOSIS — I10 Essential (primary) hypertension: Secondary | ICD-10-CM | POA: Insufficient documentation

## 2021-10-14 LAB — CBC WITH DIFFERENTIAL (CANCER CENTER ONLY)
Abs Immature Granulocytes: 0.02 10*3/uL (ref 0.00–0.07)
Basophils Absolute: 0 10*3/uL (ref 0.0–0.1)
Basophils Relative: 0 %
Eosinophils Absolute: 0.2 10*3/uL (ref 0.0–0.5)
Eosinophils Relative: 2 %
HCT: 37.6 % (ref 36.0–46.0)
Hemoglobin: 12.3 g/dL (ref 12.0–15.0)
Immature Granulocytes: 0 %
Lymphocytes Relative: 13 %
Lymphs Abs: 1 10*3/uL (ref 0.7–4.0)
MCH: 26.6 pg (ref 26.0–34.0)
MCHC: 32.7 g/dL (ref 30.0–36.0)
MCV: 81.4 fL (ref 80.0–100.0)
Monocytes Absolute: 0.5 10*3/uL (ref 0.1–1.0)
Monocytes Relative: 6 %
Neutro Abs: 6 10*3/uL (ref 1.7–7.7)
Neutrophils Relative %: 79 %
Platelet Count: 297 10*3/uL (ref 150–400)
RBC: 4.62 MIL/uL (ref 3.87–5.11)
RDW: 20.3 % — ABNORMAL HIGH (ref 11.5–15.5)
WBC Count: 7.6 10*3/uL (ref 4.0–10.5)
nRBC: 0 % (ref 0.0–0.2)

## 2021-10-14 LAB — CMP (CANCER CENTER ONLY)
ALT: 13 U/L (ref 0–44)
AST: 16 U/L (ref 15–41)
Albumin: 4.3 g/dL (ref 3.5–5.0)
Alkaline Phosphatase: 84 U/L (ref 38–126)
Anion gap: 9 (ref 5–15)
BUN: 24 mg/dL — ABNORMAL HIGH (ref 8–23)
CO2: 26 mmol/L (ref 22–32)
Calcium: 9.6 mg/dL (ref 8.9–10.3)
Chloride: 108 mmol/L (ref 98–111)
Creatinine: 0.94 mg/dL (ref 0.44–1.00)
GFR, Estimated: >60 mL/min (ref >60)
Glucose, Bld: 137 mg/dL — ABNORMAL HIGH (ref 70–99)
Potassium: 3.8 mmol/L (ref 3.5–5.1)
Sodium: 143 mmol/L (ref 135–145)
Total Bilirubin: 0.3 mg/dL (ref 0.3–1.2)
Total Protein: 6.5 g/dL (ref 6.5–8.1)

## 2021-10-14 LAB — RETIC PANEL
Immature Retic Fract: 7.1 % (ref 2.3–15.9)
RBC.: 4.57 MIL/uL (ref 3.87–5.11)
Retic Count, Absolute: 63.5 10*3/uL (ref 19.0–186.0)
Retic Ct Pct: 1.4 % (ref 0.4–3.1)
Reticulocyte Hemoglobin: 31.9 pg (ref >27.9)

## 2021-10-14 LAB — IRON AND IRON BINDING CAPACITY (CC-WL,HP ONLY)
Iron: 65 ug/dL (ref 28–170)
Saturation Ratios: 22 % (ref 10.4–31.8)
TIBC: 302 ug/dL (ref 250–450)
UIBC: 237 ug/dL (ref 148–442)

## 2021-10-14 NOTE — Progress Notes (Signed)
[place] [phone]   [phone]  PROGRESS NOTE  Patient Care Team: Velna Hatchet, MD as PCP - General (Internal Medicine)  Hematological/Oncological History # Microcytic Anemia  # Iron Deficiency Anemia 2/2 to GI Bleeding  03/25/2021: WBC 8.6, Hgb 10.5, MCV 78.7, Plt 389 08/21/2021: establish care with Dr. Lorenso Courier  6/12-7/12/2021: IV iron sucrose 200 mg x 5 doses  10/14/2021: White blood cell count 7.6, hemoglobin 12.3, MCV 81.4, and platelets of 297  Interval History:  Angie Olson 71 y.o. female with medical history significant for iron deficiency anemia secondary to GI bleeding who presents for a follow up visit. The patient's last visit was on 08/21/2021 at which time she established care. In the interim since the last visit she underwent 5 doses of IV iron sucrose.  On exam today Angie Olson reports that she tolerated the iron fusions quite well.  She was not having any issues with headache or muscle cramps during the infusions.  She notes that she only feels a little different but was hoping for much more energy.  She notes her energy is currently a 6 or 7 out of 10.  She notes that this is improved from being out of the twos but she is not quite feeling as well as she was hoped.  She is doing her best to eat iron rich foods but does not like beef and does not eat much in the way of red meat.  She reports that she is continue to undergo GI evaluation and has an upcoming CT scan in approximately 2 weeks time.  She otherwise denies any fevers, chills, sweats, nausea, vomiting, or diarrhea.  A full 10 point ROS is listed below.  MEDICAL HISTORY:  Past Medical History:  Diagnosis Date   Acid reflux    Allergic rhinitis    anesthetic complication    Respiratory arrest 02/22/19   Anxiety    PT'S SON DIED 06/22/10, husband died in Jun 22, 2019, Mother died in September 22, 2019   Arthritis    RA AND OA   Borderline hypercholesterolemia    on medication  for TIA   Bronchitis    CHRONIC    Cancer ([place])    melanoma - face    Colon polyp    Environmental allergies    GERD (gastroesophageal reflux disease)    on PPI and carafte prn   H/O hiatal hernia    History of COVID-19 February 22, 2019   History of kidney stones    Hypertension    IBS (irritable bowel syndrome)    LBP (low back pain)    OA (osteoarthritis)    Left Shoulder   Pain    RIGHT KNEE - TORN MENISCUS AND ACL   Pneumonia    X 3  - LAST TIME WAS 2011-06-22   PONV (postoperative nausea and vomiting) 09-2012   sore throat after ankle surgery 6 yrs ago and severe ponv 09-2012   Pre-diabetes    patient denies - states it was "years ago and no follow up"   Pulmonary nodule seen on imaging study    Shortness of breath    with activity,bronchitis   Stroke ([place]) 03/2011   TIA--PT EXPERIENCED NUMBNESS RT HAND AND ARM , HEADACHE AND NAUSEA. NO RESIDUAL PROBLEMS    SURGICAL HISTORY: Past Surgical History:  Procedure Laterality Date   ANKLE SURGERY  ~10 years ago   left   APPLICATION OF INTRAOPERATIVE CT SCAN N/A 03/27/2021   Procedure: APPLICATION OF  INTRAOPERATIVE CT SCAN;  Surgeon: Kary Kos, MD;  Location: [facility];  Service: Neurosurgery;  Laterality: N/A;   BACK SURGERY  2011   lower   CARPAL TUNNEL RELEASE Right 2021   CHOLECYSTECTOMY  ~15 years ago   COLONOSCOPY  2005,2009, 2013   COLONOSCOPY W/ POLYPECTOMY  10/23/2020   Small adenoma   HARDWARE REMOVAL N/A 04/07/2016   Procedure: HARDWARE REMOVAL;  Surgeon: Kary Kos, MD;  Location: [facility];  Service: Neurosurgery;  Laterality: N/A;   HARDWARE REMOVAL N/A 08/01/2020   Procedure: Removal of hardware with cutting the rod below the Lumbar one screws and removal of bilateral Lumbar four screws;  Surgeon: Kary Kos, MD;  Location: [facility];  Service: Neurosurgery;  Laterality: N/A;   HARVEST BONE GRAFT N/A 07/05/2014   Procedure: HARVEST ILIAC BONE GRAFT;  Surgeon: Kary Kos, MD;  Location: [place] [facility];  Service: Neurosurgery;   Laterality: N/A;   KNEE ARTHROSCOPY Right 10/13/2012   Procedure: RIGHT KNEE ARTHROSCOPY WITH DEBRIDEMENT, CHONDROPLASTY;  Surgeon: Gearlean Alf, MD;  Location: [facility];  Service: Orthopedics;  Laterality: Right;   KNEE ARTHROSCOPY Right 03/02/2013   Procedure: RIGHT ARTHROSCOPY KNEE WITH MEDIAL MENISCAL  DEBRIDEMENT;  Surgeon: Gearlean Alf, MD;  Location: [facility];  Service: Orthopedics;  Laterality: Right;   LAMINECTOMY WITH POSTERIOR LATERAL ARTHRODESIS LEVEL 1 N/A 08/01/2020   Procedure: posterolateral and Situ fusion;  Surgeon: Kary Kos, MD;  Location: [facility];  Service: Neurosurgery;  Laterality: N/A;   LAMINECTOMY WITH POSTERIOR LATERAL ARTHRODESIS LEVEL 1 N/A 04/01/2021   Procedure: REEXPLORATION OF LUMBAR FUSION FOR REMOVAL AND REPLACEMENT OF MALPOSITIONED RIGHT SACRAL ONE PEDICLE SCREW;  Surgeon: Kary Kos, MD;  Location: [facility];  Service: Neurosurgery;  Laterality: N/A;   LAMINECTOMY WITH POSTERIOR LATERAL ARTHRODESIS LEVEL 4 N/A 01/23/2020   Procedure: Posterior lateral fusion - Thoracic Ten -Thoracic Eleven - Thoracic Eleven -Thoracic Twelve - Thoracic Twelve- Lumbar One - Lumbar One- Lumbar Two revision with removal globus;  Surgeon: Kary Kos, MD;  Location: [place];  Service: Neurosurgery;  Laterality: N/A;  Eleven - Thoracic Eleven -Thoracic Twelve - Thoracic Twelve- Lumbar One - Lumbar One- Lumbar Two revision with removal globus   LAMINECTOMY WITH POSTERIOR LATERAL ARTHRODESIS LEVEL 4 N/A 03/27/2021   Procedure: Posterior lateral fusion - Lumbar two-Lumbar three - Lumbar three-Lumbar four - Lumbar four-Lumbar five - Lumbar five-Sacral one - Pelvis with navigation Airo;  Surgeon: Kary Kos, MD;  Location: [facility];  Service: Neurosurgery;  Laterality: N/A;   LUMBAR FUSION  07/05/2014   LUMBAR WOUND DEBRIDEMENT N/A 01/11/2014   Procedure: Irrigation and Debridement Lumbar Wound for hematoma;  Surgeon: Elaina Hoops, MD;  Location: [place] [facility];  Service: Neurosurgery;  Laterality: N/A;    MAXIMUM ACCESS (MAS)POSTERIOR LUMBAR INTERBODY FUSION (PLIF) 1 LEVEL N/A 12/05/2013   Procedure: FOR MAXIMUM ACCESS (MAS) POSTERIOR LUMBAR INTERBODY FUSION (PLIF)LUMBAR FIVE-SACRAL-ONE,REMOVAL HARDWARE LUMBAR FOUR-FIVE;  Surgeon: Elaina Hoops, MD;  Location: [place] [facility];  Service: Neurosurgery;  Laterality: N/A;   Mohr's procedure  02/2015   face   Right Hip arthroplasty     10/01/16 Dr. Wynelle Link   ROOT CANAL     SHOULDER SURGERY Left ~14 years ago   "scope"   thumb surgery Left ~14 years ago   x2   TOTAL HIP ARTHROPLASTY Left 04/25/2015   Procedure: LEFT TOTAL HIP ARTHROPLASTY ANTERIOR APPROACH;  Surgeon: Gaynelle Arabian, MD;  [facility];  Service: Orthopedics;  Laterality: Left;   TOTAL HIP  ARTHROPLASTY Right 10/01/2016   Procedure: RIGHT TOTAL HIP ARTHROPLASTY ANTERIOR APPROACH;  Surgeon: Gaynelle Arabian, MD;  [facility];  Service: Orthopedics;  Laterality: Right;   TOTAL KNEE ARTHROPLASTY Right 07/25/2013   Procedure: RIGHT TOTAL KNEE ARTHROPLASTY;  Surgeon: Gearlean Alf, MD;  [facility];  Service: Orthopedics;  Laterality: Right;   TOTAL KNEE ARTHROPLASTY WITH REVISION COMPONENTS Right 05/28/2016   Procedure: RIGHT TIBIAL POLYETHYLENE KNEE REVISION;  Surgeon: Gaynelle Arabian, MD;  [facility];  Service: Orthopedics;  Laterality: Right;  requests 40mns   TRIGGER FINGER RELEASE  ~6 years ago   X2 ON RIGHT HAND   TUBAL LIGATION      SOCIAL HISTORY: Social History   Socioeconomic History   Marital status: Married    Spouse name: Not on file   Number of children: Not on file   Years of education: Not on file   Highest education level: Not on file  Occupational History   Not on file  Tobacco Use   Smoking status: Never   Smokeless tobacco: Never  Vaping Use   Vaping Use: Never used  Substance and Sexual Activity   Alcohol use: No   Drug use: No   Sexual activity: Yes  Other Topics Concern   Not on file  Social History Narrative   Not on file    Social Determinants of Health   Financial Resource Strain: Not on file  Food Insecurity: Not on file  Transportation Needs: Not on file  Physical Activity: Not on file  Stress: Not on file  Social Connections: Not on file  Intimate Partner Violence: Not on file    FAMILY HISTORY: Family History  Problem Relation Age of Onset   Heart disease Sister    Lung cancer Brother    Heart disease Brother    CAD Brother    Heart attack Mother    Emphysema Father    Hypertension Daughter    Colon cancer Neg Hx     ALLERGIES:  is allergic to methotrexate derivatives, pseudoephedrine, reglan [metoclopramide], vicodin [hydrocodone-acetaminophen], other, and oxycodone.  MEDICATIONS:  Current Outpatient Medications  Medication Sig Dispense Refill   ALPRAZolam (XANAX) 1 MG tablet Take 1 mg by mouth at bedtime.     amLODipine (NORVASC) 5 MG tablet Take 5 mg by mouth in the morning.     atorvastatin (LIPITOR) 20 MG tablet Take 20 mg by mouth in the morning.     Calcium Carbonate-Vitamin D (CALCIUM-VITAMIN D3 PO) Take 1 tablet by mouth daily.     Capsaicin-Menthol (SALONPAS GEL-PATCH HOT) 0.025-1.25 % PTCH Apply 1 patch topically daily as needed (pain).     cetirizine (ZYRTEC) 10 MG tablet Take 10 mg by mouth in the morning.     Chlorpheniramine-PSE-Ibuprofen (ADVIL ALLERGY SINUS) 2-30-200 MG TABS Take 1 tablet by mouth daily as needed (Sinus headache).     dicyclomine (BENTYL) 20 MG tablet Take 1 tablet (20 mg total) by mouth 4 (four) times daily as needed for spasms. 90 tablet 0   esomeprazole (NEXIUM) 20 MG capsule Take 20 mg by mouth daily before breakfast.      gabapentin (NEURONTIN) 300 MG capsule Take 600 mg by mouth 2 (two) times daily.     hydroxychloroquine (PLAQUENIL) 200 MG tablet Take 200 mg by mouth in the morning.     leflunomide (ARAVA) 20 MG tablet Take 20 mg by mouth in the morning.     lisinopril (PRINIVIL,ZESTRIL) 40 MG tablet Take 40 mg by mouth  in the morning.      Multiple Vitamins-Minerals (HAIR/SKIN/NAILS) CAPS Take 1 tablet by mouth daily.     Multiple Vitamins-Minerals (MULTIVITAMIN WITH MINERALS) tablet Take 1 tablet by mouth daily.     nortriptyline (PAMELOR) 25 MG capsule Take 25 mg by mouth at bedtime.   2   predniSONE (DELTASONE) 5 MG tablet Take 5 mg by mouth daily with breakfast.     sucralfate (CARAFATE) 1 G tablet Take 1 g by mouth 3 (three) times daily as needed (indigestion).      tiZANidine (ZANAFLEX) 4 MG tablet Take 1 tablet (4 mg total) by mouth 3 (three) times daily. (Patient not taking: Reported on 09/24/2021) 90 tablet 1   No current facility-administered medications for this visit.    REVIEW OF SYSTEMS:   Constitutional: ( - ) fevers, ( - )  chills , ( - ) night sweats Eyes: ( - ) blurriness of vision, ( - ) double vision, ( - ) watery eyes Ears, nose, mouth, throat, and face: ( - ) mucositis, ( - ) sore throat Respiratory: ( - ) cough, ( - ) dyspnea, ( - ) wheezes Cardiovascular: ( - ) palpitation, ( - ) chest discomfort, ( - ) lower extremity swelling Gastrointestinal:  ( - ) nausea, ( - ) heartburn, ( - ) change in bowel habits Skin: ( - ) abnormal skin rashes Lymphatics: ( - ) new lymphadenopathy, ( - ) easy bruising Neurological: ( - ) numbness, ( - ) tingling, ( - ) new weaknesses Behavioral/Psych: ( - ) mood change, ( - ) new changes  All other systems were reviewed with the patient and are negative.  PHYSICAL EXAMINATION:  Vitals:   10/14/21 1459  BP: (!) 155/90  Pulse: (!) 109  Resp: 17  Temp: 98 F (36.7 C)  SpO2: 97%   Filed Weights   10/14/21 1459  Weight: 204 lb 1.6 oz (92.6 kg)    GENERAL: Well-appearing elderly Caucasian female, alert, no distress and comfortable SKIN: skin color, texture, turgor are normal, no rashes or significant lesions EYES: conjunctiva are pink and non-injected, sclera clear LUNGS: clear to auscultation and percussion with normal breathing effort HEART: regular rate & rhythm  and no murmurs and no lower extremity edema Musculoskeletal: no cyanosis of digits and no clubbing  PSYCH: alert & oriented x 3, fluent speech NEURO: no focal motor/sensory deficits  LABORATORY DATA:  I have reviewed the data as listed    Latest Ref Rng & Units 10/14/2021    1:38 PM 08/21/2021    1:48 PM 03/25/2021    2:52 PM  CBC  WBC 4.0 - 10.5 K/uL 7.6  7.6  8.6   Hemoglobin 12.0 - 15.0 g/dL 12.3  10.2  10.5   Hematocrit 36.0 - 46.0 % 37.6  33.1  33.9   Platelets 150 - 400 K/uL 297  345  389        Latest Ref Rng & Units 10/14/2021    1:38 PM 08/21/2021    1:48 PM 03/25/2021    2:52 PM  CMP  Glucose 70 - 99 mg/dL 137  117  117   BUN 8 - 23 mg/dL '24  26  18   '$ Creatinine 0.44 - 1.00 mg/dL 0.94  0.83  0.98   Sodium 135 - 145 mmol/L 143  142  140   Potassium 3.5 - 5.1 mmol/L 3.8  3.9  4.3   Chloride 98 - 111 mmol/L 108  107  107  CO2 22 - 32 mmol/L '26  26  25   '$ Calcium 8.9 - 10.3 mg/dL 9.6  9.5  8.3   Total Protein 6.5 - 8.1 g/dL 6.5  6.8    Total Bilirubin 0.3 - 1.2 mg/dL 0.3  0.5    Alkaline Phos 38 - 126 U/L 84  72    AST 15 - 41 U/L 16  18    ALT 0 - 44 U/L 13  15      Lab Results  Component Value Date   MPROTEIN Not Observed 08/21/2021   Lab Results  Component Value Date   KPAFRELGTCHN 11.3 08/21/2021   LAMBDASER 7.4 08/21/2021   KAPLAMBRATIO 1.53 08/21/2021    RADIOGRAPHIC STUDIES: No results found.  ASSESSMENT & PLAN Angie Olson 71 y.o. female with medical history significant for iron deficiency anemia secondary to GI bleeding who presents for a follow up visit.  After review of the labs, review of the records, and discussion with the patient the patients findings are most consistent with iron deficiency anemia likely secondary to GI bleeding.   # Iron Deficiency Anemia 2/2 to GI Bleeding -- Findings are consistent with iron deficiency anemia secondary to patient's menorrhagia --Encouraged her to follow-up with GI for continued evaluation for the source  of her bleeding.  -- Today we will order iron panel and ferritin as well as reticulocytes, CBC, and CMP -- Unable to tolerate p.o. iron therapy due to severe constipation. -- Patient underwent 5 doses of IV iron sucrose 200 mg from 08/26/2021 to 09/23/2021 --Labs today show white blood cell count 7.6, hemoglobin 12.3, MCV 81.4, and platelets of 297. --RTC in 6 months time to re-evaluate.     No orders of the defined types were placed in this encounter.   All questions were answered. The patient knows to call the clinic with any problems, questions or concerns.  A total of more than 30 minutes were spent on this encounter with face-to-face time and non-face-to-face time, including preparing to see the patient, ordering tests and/or medications, counseling the patient and coordination of care as outlined above.   Ledell Peoples, MD Department of Hematology/Oncology [place] [facility] Phone: [phone] Pager: [phone] Email: [email or web address]  10/14/2021 4:03 PM

## 2021-10-15 ENCOUNTER — Telehealth: Payer: Self-pay

## 2021-10-15 LAB — FERRITIN: Ferritin: 153 ng/mL (ref 11–307)

## 2021-10-15 NOTE — Telephone Encounter (Addendum)
Called pt to advise of message below regarding iron levels. Pt voiced appreciation and understanding.  ----- Message from Orson Slick, MD sent at 10/15/2021  9:55 AM EDT ----- Please let Angie Olson know that her iron levels have increased markedly.  Her ferritin is currently at 153 up all the way from 19 (goal is >100).  We will plan to see her back in 6 months as scheduled. ----- Message ----- From: Buel Ream, Lab In [place] Sent: 10/14/2021   2:00 PM EDT To: Orson Slick, MD

## 2021-10-21 ENCOUNTER — Telehealth: Payer: Self-pay

## 2021-10-21 NOTE — Telephone Encounter (Signed)
Patient called in stating [facility] told her to pick up contrast from our office for CT. Contrast has been placed on second floor & patient made aware to come by and pick it up when able. Pt verbalized all understanding.

## 2021-10-25 ENCOUNTER — Encounter (HOSPITAL_COMMUNITY): Payer: Self-pay

## 2021-10-25 ENCOUNTER — Ambulatory Visit (HOSPITAL_COMMUNITY)
Admission: RE | Admit: 2021-10-25 | Discharge: 2021-10-25 | Disposition: A | Discharge status: Home / Self Care | Payer: Medicare Other | Source: Ambulatory Visit | Attending: Internal Medicine | Admitting: Internal Medicine

## 2021-10-25 DIAGNOSIS — R197 Diarrhea, unspecified: Secondary | ICD-10-CM

## 2021-10-25 DIAGNOSIS — R1084 Generalized abdominal pain: Secondary | ICD-10-CM | POA: Diagnosis present

## 2021-10-25 MED ORDER — SODIUM CHLORIDE (PF) 0.9 % IJ SOLN
INTRAMUSCULAR | Status: AC
  Filled 2021-10-25: qty 50

## 2021-10-25 MED ORDER — IOHEXOL 300 MG/ML  SOLN
100.0000 mL | Freq: Once | INTRAMUSCULAR | Status: AC | PRN
  Administered 2021-10-25: 100 mL via INTRAVENOUS

## 2021-12-06 ENCOUNTER — Encounter: Payer: Self-pay | Admitting: Hematology and Oncology

## 2022-02-11 ENCOUNTER — Other Ambulatory Visit: Payer: Self-pay | Admitting: Neurosurgery

## 2022-02-11 DIAGNOSIS — M25551 Pain in right hip: Secondary | ICD-10-CM

## 2022-02-17 ENCOUNTER — Encounter: Payer: Self-pay | Admitting: Hematology and Oncology

## 2022-02-26 ENCOUNTER — Telehealth: Payer: Self-pay | Admitting: Hematology and Oncology

## 2022-02-26 NOTE — Telephone Encounter (Signed)
Called patient to r/s January appointment due to provider PAL. Patient notified of new appointment time.

## 2022-03-20 ENCOUNTER — Ambulatory Visit: Payer: Medicare Other | Admitting: Podiatry

## 2022-03-24 ENCOUNTER — Inpatient Hospital Stay: Admission: RE | Admit: 2022-03-24 | Payer: Medicare Other | Source: Ambulatory Visit

## 2022-04-01 ENCOUNTER — Ambulatory Visit: Payer: Medicare Other | Admitting: Podiatry

## 2022-04-01 DIAGNOSIS — L603 Nail dystrophy: Secondary | ICD-10-CM

## 2022-04-01 DIAGNOSIS — G629 Polyneuropathy, unspecified: Secondary | ICD-10-CM | POA: Insufficient documentation

## 2022-04-01 DIAGNOSIS — S32591A Other specified fracture of right pubis, initial encounter for closed fracture: Secondary | ICD-10-CM | POA: Insufficient documentation

## 2022-04-01 DIAGNOSIS — G5600 Carpal tunnel syndrome, unspecified upper limb: Secondary | ICD-10-CM

## 2022-04-01 HISTORY — DX: Other specified fracture of right pubis, initial encounter for closed fracture: S32.591A

## 2022-04-01 HISTORY — DX: Carpal tunnel syndrome, unspecified upper limb: G56.00

## 2022-04-02 NOTE — Progress Notes (Signed)
  Subjective:  Patient ID: Angie Olson, female    DOB: [birth year],  MRN: [id number]  Chief Complaint  Patient presents with   Nail Problem    (np) right great toe redness x 3 months, all around the nail bed and tip of toe - not as red as it was. She has had multiple back surgeries and has also had shingles on that foot in the past.     72 y.o. female presents with the above complaint. History confirmed with patient.  Since she made the appointment it is starting to get better.  There are still some redness but no pain no drainage and nail has not been loose  Objective:  Physical Exam: warm, good capillary refill, no trophic changes or ulcerative lesions, normal DP and PT pulses, normal sensory exam, and digital erythema of all toes, no signs of infection or ingrown nail around the right hallux.  Assessment:   1. Nail dystrophy      Plan:  Patient was evaluated and treated and all questions answered.  I discussed with her there does not appear to be any infection in the toe.  The nail is not painful.  I do not think removal of the nail plate is necessary.  The erythema she has is consistent with the skin tone and color of the lesser digits as well.  She will continue to monitor this.  If returns or worsens she will return to see me as needed  Return if symptoms worsen or fail to improve.

## 2022-04-14 ENCOUNTER — Ambulatory Visit: Payer: Medicare Other | Admitting: Hematology and Oncology

## 2022-04-14 ENCOUNTER — Other Ambulatory Visit: Payer: Medicare Other

## 2022-04-22 ENCOUNTER — Other Ambulatory Visit: Payer: Self-pay | Admitting: Hematology and Oncology

## 2022-04-22 ENCOUNTER — Inpatient Hospital Stay: Payer: Medicare Other | Admitting: Hematology and Oncology

## 2022-04-22 ENCOUNTER — Other Ambulatory Visit: Payer: Self-pay

## 2022-04-22 ENCOUNTER — Inpatient Hospital Stay: Payer: Medicare Other | Attending: Hematology and Oncology

## 2022-04-22 DIAGNOSIS — Z801 Family history of malignant neoplasm of trachea, bronchus and lung: Secondary | ICD-10-CM | POA: Diagnosis not present

## 2022-04-22 DIAGNOSIS — I10 Essential (primary) hypertension: Secondary | ICD-10-CM | POA: Insufficient documentation

## 2022-04-22 DIAGNOSIS — Z85820 Personal history of malignant melanoma of skin: Secondary | ICD-10-CM | POA: Diagnosis not present

## 2022-04-22 DIAGNOSIS — D500 Iron deficiency anemia secondary to blood loss (chronic): Secondary | ICD-10-CM

## 2022-04-22 DIAGNOSIS — R519 Headache, unspecified: Secondary | ICD-10-CM | POA: Diagnosis not present

## 2022-04-22 LAB — RETIC PANEL
Immature Retic Fract: 11.9 % (ref 2.3–15.9)
RBC.: 4.35 MIL/uL (ref 3.87–5.11)
Retic Count, Absolute: 74 10*3/uL (ref 19.0–186.0)
Retic Ct Pct: 1.7 % (ref 0.4–3.1)
Reticulocyte Hemoglobin: 31.8 pg (ref >27.9)

## 2022-04-22 LAB — IRON AND IRON BINDING CAPACITY (CC-WL,HP ONLY)
Iron: 61 ug/dL (ref 28–170)
Saturation Ratios: 17 % (ref 10.4–31.8)
TIBC: 350 ug/dL (ref 250–450)
UIBC: 289 ug/dL (ref 148–442)

## 2022-04-22 LAB — CBC WITH DIFFERENTIAL (CANCER CENTER ONLY)
Abs Immature Granulocytes: 0.02 10*3/uL (ref 0.00–0.07)
Basophils Absolute: 0 10*3/uL (ref 0.0–0.1)
Basophils Relative: 0 %
Eosinophils Absolute: 0.2 10*3/uL (ref 0.0–0.5)
Eosinophils Relative: 2 %
HCT: 37.6 % (ref 36.0–46.0)
Hemoglobin: 12.2 g/dL (ref 12.0–15.0)
Immature Granulocytes: 0 %
Lymphocytes Relative: 13 %
Lymphs Abs: 1.1 10*3/uL (ref 0.7–4.0)
MCH: 27.6 pg (ref 26.0–34.0)
MCHC: 32.4 g/dL (ref 30.0–36.0)
MCV: 85.1 fL (ref 80.0–100.0)
Monocytes Absolute: 0.8 10*3/uL (ref 0.1–1.0)
Monocytes Relative: 9 %
Neutro Abs: 6.4 10*3/uL (ref 1.7–7.7)
Neutrophils Relative %: 76 %
Platelet Count: 329 10*3/uL (ref 150–400)
RBC: 4.42 MIL/uL (ref 3.87–5.11)
RDW: 15.7 % — ABNORMAL HIGH (ref 11.5–15.5)
WBC Count: 8.6 10*3/uL (ref 4.0–10.5)
nRBC: 0 % (ref 0.0–0.2)

## 2022-04-22 LAB — FERRITIN: Ferritin: 33 ng/mL (ref 11–307)

## 2022-04-22 LAB — CMP (CANCER CENTER ONLY)
ALT: 14 U/L (ref 0–44)
AST: 14 U/L — ABNORMAL LOW (ref 15–41)
Albumin: 4 g/dL (ref 3.5–5.0)
Alkaline Phosphatase: 68 U/L (ref 38–126)
Anion gap: 6 (ref 5–15)
BUN: 22 mg/dL (ref 8–23)
CO2: 29 mmol/L (ref 22–32)
Calcium: 9.5 mg/dL (ref 8.9–10.3)
Chloride: 107 mmol/L (ref 98–111)
Creatinine: 0.77 mg/dL (ref 0.44–1.00)
GFR, Estimated: >60 mL/min (ref >60)
Glucose, Bld: 83 mg/dL (ref 70–99)
Potassium: 3.8 mmol/L (ref 3.5–5.1)
Sodium: 142 mmol/L (ref 135–145)
Total Bilirubin: 0.4 mg/dL (ref 0.3–1.2)
Total Protein: 5.9 g/dL — ABNORMAL LOW (ref 6.5–8.1)

## 2022-04-22 NOTE — Progress Notes (Signed)
[employer] [phone]   [phone]  PROGRESS NOTE  Patient Care Team: Velna Hatchet, MD as PCP - General (Internal Medicine)  Hematological/Oncological History # Microcytic Anemia  # Iron Deficiency Anemia 2/2 to GI Bleeding  03/25/2021: WBC 8.6, Hgb 10.5, MCV 78.7, Plt 389 08/21/2021: establish care with Dr. Lorenso Courier  6/12-7/12/2021: IV iron sucrose 200 mg x 5 doses  10/14/2021: White blood cell count 7.6, hemoglobin 12.3, MCV 81.4, and platelets of 297  Interval History:  Angie Olson 72 y.o. female with medical history significant for iron deficiency anemia secondary to GI bleeding who presents for a follow up visit. The patient's last visit was on 10/14/2021. In the interim since the last visit she had a COVID infection in January 2024.  On exam today Ms. Grilli reports she has been feeling more tired.  She reports that her energy is about a 6 out of 10 today.  She has been having some occasional headaches.  She also bought a puppy in December and has been waking her up approximately 3 times per night.  It has been teething and biting her frequently.  She reports that she is not having any overt bleeding, bruising, or dark stools.  Otherwise she is at her baseline level of health with no new medications or other changes. She denies any fevers, chills, sweats, nausea, vomiting, or diarrhea.  A full 10 point ROS is listed below.  MEDICAL HISTORY:  Past Medical History:  Diagnosis Date   Acid reflux    Allergic rhinitis    anesthetic complication    Respiratory arrest February 22, 2019   Anxiety    PT'S SON DIED Jun 22, 2010, husband died in 2019/06/22, Mother died in 22-Sep-2019   Arthritis    RA AND OA   Borderline hypercholesterolemia    on medication for TIA   Bronchitis    CHRONIC    Cancer ([place])    melanoma - face    Colon polyp    Environmental allergies    GERD (gastroesophageal reflux disease)    on PPI and carafte prn   H/O hiatal hernia     History of COVID-19 2019/02/22   History of kidney stones    Hypertension    IBS (irritable bowel syndrome)    LBP (low back pain)    OA (osteoarthritis)    Left Shoulder   Pain    RIGHT KNEE - TORN MENISCUS AND ACL   Pneumonia    X 3  - LAST TIME WAS June 22, 2011   PONV (postoperative nausea and vomiting) 09-2012   sore throat after ankle surgery 6 yrs ago and severe ponv 09-2012   Pre-diabetes    patient denies - states it was "years ago and no follow up"   Pulmonary nodule seen on imaging study    Shortness of breath    with activity,bronchitis   Stroke ([place]) 03/2011   TIA--PT EXPERIENCED NUMBNESS RT HAND AND ARM , HEADACHE AND NAUSEA. NO RESIDUAL PROBLEMS    SURGICAL HISTORY: Past Surgical History:  Procedure Laterality Date   ANKLE SURGERY  ~10 years ago   left   APPLICATION OF INTRAOPERATIVE CT SCAN N/A 03/27/2021   Procedure: APPLICATION OF INTRAOPERATIVE CT SCAN;  Surgeon: Kary Kos, MD;  Location: [place];  Service: Neurosurgery;  Laterality: N/A;   BACK SURGERY  06/21/2009   lower   CARPAL TUNNEL RELEASE Right 06/22/2019   CHOLECYSTECTOMY  ~15 years ago   COLONOSCOPY  06/22/2003, 22-Jun-2011  COLONOSCOPY W/ POLYPECTOMY  10/23/2020   Small adenoma   HARDWARE REMOVAL N/A 04/07/2016   Procedure: HARDWARE REMOVAL;  Surgeon: Kary Kos, MD;  Location: [place];  Service: Neurosurgery;  Laterality: N/A;   HARDWARE REMOVAL N/A 08/01/2020   Procedure: Removal of hardware with cutting the rod below the Lumbar one screws and removal of bilateral Lumbar four screws;  Surgeon: Kary Kos, MD;  Location: [place];  Service: Neurosurgery;  Laterality: N/A;   HARVEST BONE GRAFT N/A 07/05/2014   Procedure: HARVEST ILIAC BONE GRAFT;  Surgeon: Kary Kos, MD;  Location: [place] [facility];  Service: Neurosurgery;  Laterality: N/A;   KNEE ARTHROSCOPY Right 10/13/2012   Procedure: RIGHT KNEE ARTHROSCOPY WITH DEBRIDEMENT, CHONDROPLASTY;  Surgeon: Gearlean Alf, MD;  Location: [facility];  Service: Orthopedics;  Laterality: Right;    KNEE ARTHROSCOPY Right 03/02/2013   Procedure: RIGHT ARTHROSCOPY KNEE WITH MEDIAL MENISCAL  DEBRIDEMENT;  Surgeon: Gearlean Alf, MD;  [facility]: [facility];  Service: Orthopedics;  Laterality: Right;   LAMINECTOMY WITH POSTERIOR LATERAL ARTHRODESIS LEVEL 1 N/A 08/01/2020   Procedure: posterolateral and Situ fusion;  Surgeon: Kary Kos, MD;  Location: [place];  Service: Neurosurgery;  Laterality: N/A;   LAMINECTOMY WITH POSTERIOR LATERAL ARTHRODESIS LEVEL 1 N/A 04/01/2021   Procedure: REEXPLORATION OF LUMBAR FUSION FOR REMOVAL AND REPLACEMENT OF MALPOSITIONED RIGHT SACRAL ONE PEDICLE SCREW;  Surgeon: Kary Kos, MD;  Location: [place];  Service: Neurosurgery;  Laterality: N/A;   LAMINECTOMY WITH POSTERIOR LATERAL ARTHRODESIS LEVEL 4 N/A 01/23/2020   Procedure: Posterior lateral fusion - Thoracic Ten -Thoracic Eleven - Thoracic Eleven -Thoracic Twelve - Thoracic Twelve- Lumbar One - Lumbar One- Lumbar Two revision with removal globus;  Surgeon: Kary Kos, MD;  Location: [place];  Service: Neurosurgery;  Laterality: N/A;  Eleven - Thoracic Eleven -Thoracic Twelve - Thoracic Twelve- Lumbar One - Lumbar One- Lumbar Two revision with removal globus   LAMINECTOMY WITH POSTERIOR LATERAL ARTHRODESIS LEVEL 4 N/A 03/27/2021   Procedure: Posterior lateral fusion - Lumbar two-Lumbar three - Lumbar three-Lumbar four - Lumbar four-Lumbar five - Lumbar five-Sacral one - Pelvis with navigation Airo;  Surgeon: Kary Kos, MD;  Location: [place];  Service: Neurosurgery;  Laterality: N/A;   LUMBAR FUSION  07/05/2014   LUMBAR WOUND DEBRIDEMENT N/A 01/11/2014   Procedure: Irrigation and Debridement Lumbar Wound for hematoma;  Surgeon: Elaina Hoops, MD;  Location: [place] [facility];  Service: Neurosurgery;  Laterality: N/A;   MAXIMUM ACCESS (MAS)POSTERIOR LUMBAR INTERBODY FUSION (PLIF) 1 LEVEL N/A 12/05/2013   Procedure: FOR MAXIMUM ACCESS (MAS) POSTERIOR LUMBAR INTERBODY FUSION (PLIF)LUMBAR FIVE-SACRAL-ONE,REMOVAL HARDWARE LUMBAR  FOUR-FIVE;  Surgeon: Elaina Hoops, MD;  Location: [place] [facility];  Service: Neurosurgery;  Laterality: N/A;   Mohr's procedure  02/2015   face   Right Hip arthroplasty     10/01/16 Dr. Wynelle Link   ROOT CANAL     SHOULDER SURGERY Left ~14 years ago   "scope"   thumb surgery Left ~14 years ago   x2   TOTAL HIP ARTHROPLASTY Left 04/25/2015   Procedure: LEFT TOTAL HIP ARTHROPLASTY ANTERIOR APPROACH;  Surgeon: Gaynelle Arabian, MD;  [facility];  Service: Orthopedics;  Laterality: Left;   TOTAL HIP ARTHROPLASTY Right 10/01/2016   Procedure: RIGHT TOTAL HIP ARTHROPLASTY ANTERIOR APPROACH;  Surgeon: Gaynelle Arabian, MD;  [facility];  Service: Orthopedics;  Laterality: Right;   TOTAL KNEE ARTHROPLASTY Right 07/25/2013   Procedure: RIGHT TOTAL KNEE ARTHROPLASTY;  Surgeon: Gearlean Alf, MD;  [facility];  Service: Orthopedics;  Laterality: Right;   TOTAL KNEE ARTHROPLASTY WITH REVISION COMPONENTS Right 05/28/2016   Procedure: RIGHT TIBIAL POLYETHYLENE KNEE REVISION;  Surgeon: Gaynelle Arabian, MD;  [facility];  Service: Orthopedics;  Laterality: Right;  requests 33mns   TRIGGER FINGER RELEASE  ~6 years ago   X2 ON RIGHT HAND   TUBAL LIGATION      SOCIAL HISTORY: Social History   Socioeconomic History   Marital status: Widowed    Spouse name: Not on file   Number of children: Not on file   Years of education: Not on file   Highest education level: Not on file  Occupational History   Not on file  Tobacco Use   Smoking status: Never   Smokeless tobacco: Never  Vaping Use   Vaping Use: Never used  Substance and Sexual Activity   Alcohol use: No   Drug use: No   Sexual activity: Yes  Other Topics Concern   Not on file  Social History Narrative   Not on file   Social Determinants of Health   Financial Resource Strain: Not on file  Food Insecurity: Not on file  Transportation Needs: Not on file  Physical Activity: Not on file  Stress: Not on file  Social  Connections: Not on file  Intimate Partner Violence: Not on file    FAMILY HISTORY: Family History  Problem Relation Age of Onset   Heart disease Sister    Lung cancer Brother    Heart disease Brother    CAD Brother    Heart attack Mother    Emphysema Father    Hypertension Daughter    Colon cancer Neg Hx     ALLERGIES:  is allergic to methotrexate derivatives, pseudoephedrine, reglan [metoclopramide], vicodin [hydrocodone-acetaminophen], other, and oxycodone.  MEDICATIONS:  Current Outpatient Medications  Medication Sig Dispense Refill   alendronate (FOSAMAX) 70 MG tablet Take 70 mg by mouth once a week.     ALPRAZolam (XANAX) 1 MG tablet Take 1 mg by mouth at bedtime.     amLODipine (NORVASC) 5 MG tablet Take 5 mg by mouth in the morning.     atorvastatin (LIPITOR) 20 MG tablet Take 20 mg by mouth in the morning.     benzonatate (TESSALON) 100 MG capsule Take 100 mg by mouth 3 (three) times daily as needed.     Calcium Carbonate-Vitamin D (CALCIUM-VITAMIN D3 PO) Take 1 tablet by mouth daily.     Capsaicin-Menthol (SALONPAS GEL-PATCH HOT) 0.025-1.25 % PTCH Apply 1 patch topically daily as needed (pain).     cetirizine (ZYRTEC) 10 MG tablet Take 10 mg by mouth in the morning.     Chlorpheniramine-PSE-Ibuprofen (ADVIL ALLERGY SINUS) 2-30-200 MG TABS Take 1 tablet by mouth daily as needed (Sinus headache).     dicyclomine (BENTYL) 20 MG tablet Take 1 tablet (20 mg total) by mouth 4 (four) times daily as needed for spasms. 90 tablet 0   esomeprazole (NEXIUM) 20 MG capsule Take 20 mg by mouth daily before breakfast.      fluticasone (FLONASE) 50 MCG/ACT nasal spray SMARTSIG:1 Spray(s) Both Nares Twice Daily PRN     gabapentin (NEURONTIN) 300 MG capsule Take 600 mg by mouth 2 (two) times daily.     hydroxychloroquine (PLAQUENIL) 200 MG tablet Take 200 mg by mouth in the morning.     leflunomide (ARAVA) 20 MG tablet Take 20 mg by mouth in the morning.     lisinopril (PRINIVIL,ZESTRIL)  40 MG  tablet Take 40 mg by mouth in the morning.     Multiple Vitamins-Minerals (HAIR/SKIN/NAILS) CAPS Take 1 tablet by mouth daily.     Multiple Vitamins-Minerals (MULTIVITAMIN WITH MINERALS) tablet Take 1 tablet by mouth daily.     mupirocin ointment (BACTROBAN) 2 % Apply topically 2 (two) times daily.     nortriptyline (PAMELOR) 25 MG capsule Take 25 mg by mouth at bedtime.   2   predniSONE (DELTASONE) 5 MG tablet Take 5 mg by mouth daily with breakfast.     sodium chloride 0.9 % nebulizer solution SMARTSIG:1 Milliliter(s) Via Inhaler Every 2 Hours PRN     sucralfate (CARAFATE) 1 G tablet Take 1 g by mouth 3 (three) times daily as needed (indigestion).      diclofenac Sodium (VOLTAREN) 1 % GEL Apply 2 g topically 4 (four) times daily.     No current facility-administered medications for this visit.    REVIEW OF SYSTEMS:   Constitutional: ( - ) fevers, ( - )  chills , ( - ) night sweats Eyes: ( - ) blurriness of vision, ( - ) double vision, ( - ) watery eyes Ears, nose, mouth, throat, and face: ( - ) mucositis, ( - ) sore throat Respiratory: ( - ) cough, ( - ) dyspnea, ( - ) wheezes Cardiovascular: ( - ) palpitation, ( - ) chest discomfort, ( - ) lower extremity swelling Gastrointestinal:  ( - ) nausea, ( - ) heartburn, ( - ) change in bowel habits Skin: ( - ) abnormal skin rashes Lymphatics: ( - ) new lymphadenopathy, ( - ) easy bruising Neurological: ( - ) numbness, ( - ) tingling, ( - ) new weaknesses Behavioral/Psych: ( - ) mood change, ( - ) new changes  All other systems were reviewed with the patient and are negative.  PHYSICAL EXAMINATION:  There were no vitals filed for this visit.  There were no vitals filed for this visit.   GENERAL: Well-appearing elderly Caucasian female, alert, no distress and comfortable SKIN: skin color, texture, turgor are normal, no rashes or significant lesions EYES: conjunctiva are pink and non-injected, sclera clear LUNGS: clear to  auscultation and percussion with normal breathing effort HEART: regular rate & rhythm and no murmurs and no lower extremity edema Musculoskeletal: no cyanosis of digits and no clubbing  PSYCH: alert & oriented x 3, fluent speech NEURO: no focal motor/sensory deficits  LABORATORY DATA:  I have reviewed the data as listed    Latest Ref Rng & Units 04/22/2022    9:45 AM 10/14/2021    1:38 PM 08/21/2021    1:48 PM  CBC  WBC 4.0 - 10.5 K/uL 8.6  7.6  7.6   Hemoglobin 12.0 - 15.0 g/dL 12.2  12.3  10.2   Hematocrit 36.0 - 46.0 % 37.6  37.6  33.1   Platelets 150 - 400 K/uL 329  297  345        Latest Ref Rng & Units 04/22/2022    9:45 AM 10/14/2021    1:38 PM 08/21/2021    1:48 PM  CMP  Glucose 70 - 99 mg/dL 83  137  117   BUN 8 - 23 mg/dL '22  24  26   '$ Creatinine 0.44 - 1.00 mg/dL 0.77  0.94  0.83   Sodium 135 - 145 mmol/L 142  143  142   Potassium 3.5 - 5.1 mmol/L 3.8  3.8  3.9   Chloride 98 - 111 mmol/L 107  108  107   CO2 22 - 32 mmol/L '29  26  26   '$ Calcium 8.9 - 10.3 mg/dL 9.5  9.6  9.5   Total Protein 6.5 - 8.1 g/dL 5.9  6.5  6.8   Total Bilirubin 0.3 - 1.2 mg/dL 0.4  0.3  0.5   Alkaline Phos 38 - 126 U/L 68  84  72   AST 15 - 41 U/L '14  16  18   '$ ALT 0 - 44 U/L '14  13  15     '$ Lab Results  Component Value Date   MPROTEIN Not Observed 08/21/2021   Lab Results  Component Value Date   KPAFRELGTCHN 11.3 08/21/2021   LAMBDASER 7.4 08/21/2021   KAPLAMBRATIO 1.53 08/21/2021    RADIOGRAPHIC STUDIES: No results found.  ASSESSMENT & PLAN Angie Olson 73 y.o. female with medical history significant for iron deficiency anemia secondary to GI bleeding who presents for a follow up visit.  After review of the labs, review of the records, and discussion with the patient the patients findings are most consistent with iron deficiency anemia likely secondary to GI bleeding.   # Iron Deficiency Anemia 2/2 to GI Bleeding -- Findings are consistent with iron deficiency anemia secondary  to likely GI bleeding --patient underwent EGD on 09/24/2021 and colonoscopy on 10/23/2020. No clear source of bleeding identified, however inflammation was noted in the stomach.  -- Today we ordered iron panel and ferritin as well as reticulocytes, CBC, and CMP -- Unable to tolerate p.o. iron therapy due to severe constipation. -- Patient underwent 5 doses of IV iron sucrose 200 mg from 08/26/2021 to 09/23/2021 --Labs today show white blood cell count 8.6, hemoglobin 12.2, MCV 85.1, and platelets of 329 --RTC in 6 months time to re-evaluate.  Offered to return patient to primary care provider but she would like to follow with [place] on a routine basis.  We will see her back in 6 months and then consider yearly follow-ups after that.    No orders of the defined types were placed in this encounter.   All questions were answered. The patient knows to call the clinic with any problems, questions or concerns.  A total of more than 30 minutes were spent on this encounter with face-to-face time and non-face-to-face time, including preparing to see the patient, ordering tests and/or medications, counseling the patient and coordination of care as outlined above.   Ledell Peoples, MD Department of Hematology/Oncology [place] at [facility] Phone: [phone] Pager: [phone] Email: [email or web address]  04/22/2022 3:33 PM

## 2022-04-25 ENCOUNTER — Ambulatory Visit
Admission: RE | Admit: 2022-04-25 | Discharge: 2022-04-25 | Disposition: A | Discharge status: Home / Self Care | Payer: Medicare Other | Source: Ambulatory Visit | Attending: Neurosurgery | Admitting: Neurosurgery

## 2022-04-25 DIAGNOSIS — M25551 Pain in right hip: Secondary | ICD-10-CM

## 2022-04-30 ENCOUNTER — Other Ambulatory Visit (HOSPITAL_COMMUNITY): Payer: Self-pay | Admitting: Neurosurgery

## 2022-04-30 DIAGNOSIS — I739 Peripheral vascular disease, unspecified: Secondary | ICD-10-CM

## 2022-05-12 ENCOUNTER — Ambulatory Visit (HOSPITAL_COMMUNITY)
Admission: RE | Admit: 2022-05-12 | Discharge: 2022-05-12 | Disposition: A | Discharge status: Home / Self Care | Payer: Medicare Other | Source: Ambulatory Visit | Attending: Cardiology | Admitting: Cardiology

## 2022-05-12 DIAGNOSIS — I739 Peripheral vascular disease, unspecified: Secondary | ICD-10-CM

## 2022-05-13 LAB — VAS US ABI WITH/WO TBI
Left ABI: 1.16
Right ABI: 1.19

## 2022-06-02 DIAGNOSIS — I739 Peripheral vascular disease, unspecified: Secondary | ICD-10-CM | POA: Insufficient documentation

## 2022-06-23 ENCOUNTER — Other Ambulatory Visit: Payer: Self-pay

## 2022-06-23 ENCOUNTER — Emergency Department (HOSPITAL_COMMUNITY): Payer: Medicare Other

## 2022-06-23 ENCOUNTER — Encounter (HOSPITAL_COMMUNITY): Payer: Self-pay

## 2022-06-23 ENCOUNTER — Inpatient Hospital Stay (HOSPITAL_COMMUNITY)
Admission: EM | Admit: 2022-06-23 | Discharge: 2022-06-25 | DRG: 195 | Disposition: A | Discharge status: Home / Self Care | Payer: Medicare Other | Attending: Family Medicine | Admitting: Family Medicine

## 2022-06-23 DIAGNOSIS — J123 Human metapneumovirus pneumonia: Principal | ICD-10-CM | POA: Diagnosis present

## 2022-06-23 DIAGNOSIS — Z825 Family history of asthma and other chronic lower respiratory diseases: Secondary | ICD-10-CM

## 2022-06-23 DIAGNOSIS — R7303 Prediabetes: Secondary | ICD-10-CM | POA: Diagnosis present

## 2022-06-23 DIAGNOSIS — Z8616 Personal history of COVID-19: Secondary | ICD-10-CM

## 2022-06-23 DIAGNOSIS — Z6832 Body mass index (BMI) 32.0-32.9, adult: Secondary | ICD-10-CM

## 2022-06-23 DIAGNOSIS — J189 Pneumonia, unspecified organism: Secondary | ICD-10-CM | POA: Diagnosis not present

## 2022-06-23 DIAGNOSIS — J129 Viral pneumonia, unspecified: Secondary | ICD-10-CM | POA: Diagnosis present

## 2022-06-23 DIAGNOSIS — F419 Anxiety disorder, unspecified: Secondary | ICD-10-CM | POA: Diagnosis present

## 2022-06-23 DIAGNOSIS — B9781 Human metapneumovirus as the cause of diseases classified elsewhere: Secondary | ICD-10-CM | POA: Diagnosis present

## 2022-06-23 DIAGNOSIS — R0902 Hypoxemia: Secondary | ICD-10-CM | POA: Diagnosis present

## 2022-06-23 DIAGNOSIS — Z7983 Long term (current) use of bisphosphonates: Secondary | ICD-10-CM

## 2022-06-23 DIAGNOSIS — Z7952 Long term (current) use of systemic steroids: Secondary | ICD-10-CM

## 2022-06-23 DIAGNOSIS — Z885 Allergy status to narcotic agent status: Secondary | ICD-10-CM

## 2022-06-23 DIAGNOSIS — K219 Gastro-esophageal reflux disease without esophagitis: Secondary | ICD-10-CM | POA: Diagnosis present

## 2022-06-23 DIAGNOSIS — Z79899 Other long term (current) drug therapy: Secondary | ICD-10-CM

## 2022-06-23 DIAGNOSIS — Z96643 Presence of artificial hip joint, bilateral: Secondary | ICD-10-CM | POA: Diagnosis present

## 2022-06-23 DIAGNOSIS — Z1152 Encounter for screening for COVID-19: Secondary | ICD-10-CM

## 2022-06-23 DIAGNOSIS — E7800 Pure hypercholesterolemia, unspecified: Secondary | ICD-10-CM | POA: Diagnosis present

## 2022-06-23 DIAGNOSIS — J42 Unspecified chronic bronchitis: Secondary | ICD-10-CM | POA: Diagnosis present

## 2022-06-23 DIAGNOSIS — Z634 Disappearance and death of family member: Secondary | ICD-10-CM

## 2022-06-23 DIAGNOSIS — R0600 Dyspnea, unspecified: Principal | ICD-10-CM

## 2022-06-23 DIAGNOSIS — Z888 Allergy status to other drugs, medicaments and biological substances status: Secondary | ICD-10-CM

## 2022-06-23 DIAGNOSIS — M069 Rheumatoid arthritis, unspecified: Secondary | ICD-10-CM | POA: Diagnosis present

## 2022-06-23 DIAGNOSIS — E669 Obesity, unspecified: Secondary | ICD-10-CM | POA: Diagnosis present

## 2022-06-23 DIAGNOSIS — Z85820 Personal history of malignant melanoma of skin: Secondary | ICD-10-CM

## 2022-06-23 DIAGNOSIS — Z96651 Presence of right artificial knee joint: Secondary | ICD-10-CM | POA: Diagnosis present

## 2022-06-23 DIAGNOSIS — Z8249 Family history of ischemic heart disease and other diseases of the circulatory system: Secondary | ICD-10-CM

## 2022-06-23 DIAGNOSIS — Z87442 Personal history of urinary calculi: Secondary | ICD-10-CM

## 2022-06-23 DIAGNOSIS — Z602 Problems related to living alone: Secondary | ICD-10-CM | POA: Diagnosis present

## 2022-06-23 DIAGNOSIS — I10 Essential (primary) hypertension: Secondary | ICD-10-CM | POA: Diagnosis present

## 2022-06-23 DIAGNOSIS — Z801 Family history of malignant neoplasm of trachea, bronchus and lung: Secondary | ICD-10-CM

## 2022-06-23 DIAGNOSIS — Z8673 Personal history of transient ischemic attack (TIA), and cerebral infarction without residual deficits: Secondary | ICD-10-CM

## 2022-06-23 DIAGNOSIS — Z886 Allergy status to analgesic agent status: Secondary | ICD-10-CM

## 2022-06-23 DIAGNOSIS — Z8701 Personal history of pneumonia (recurrent): Secondary | ICD-10-CM

## 2022-06-23 DIAGNOSIS — E876 Hypokalemia: Secondary | ICD-10-CM | POA: Diagnosis present

## 2022-06-23 DIAGNOSIS — E785 Hyperlipidemia, unspecified: Secondary | ICD-10-CM | POA: Diagnosis present

## 2022-06-23 LAB — COMPREHENSIVE METABOLIC PANEL
ALT: 38 U/L (ref 0–44)
AST: 29 U/L (ref 15–41)
Albumin: 4 g/dL (ref 3.5–5.0)
Alkaline Phosphatase: 70 U/L (ref 38–126)
Anion gap: 12 (ref 5–15)
BUN: 19 mg/dL (ref 8–23)
CO2: 28 mmol/L (ref 22–32)
Calcium: 9.3 mg/dL (ref 8.9–10.3)
Chloride: 103 mmol/L (ref 98–111)
Creatinine, Ser: 0.84 mg/dL (ref 0.44–1.00)
GFR, Estimated: >60 mL/min (ref >60)
Glucose, Bld: 94 mg/dL (ref 70–99)
Potassium: 3 mmol/L — ABNORMAL LOW (ref 3.5–5.1)
Sodium: 143 mmol/L (ref 135–145)
Total Bilirubin: 0.5 mg/dL (ref 0.3–1.2)
Total Protein: 7.3 g/dL (ref 6.5–8.1)

## 2022-06-23 LAB — CBC WITH DIFFERENTIAL/PLATELET
Abs Immature Granulocytes: 0.03 10*3/uL (ref 0.00–0.07)
Basophils Absolute: 0 10*3/uL (ref 0.0–0.1)
Basophils Relative: 0 %
Eosinophils Absolute: 0.1 10*3/uL (ref 0.0–0.5)
Eosinophils Relative: 1 %
HCT: 41.2 % (ref 36.0–46.0)
Hemoglobin: 13 g/dL (ref 12.0–15.0)
Immature Granulocytes: 0 %
Lymphocytes Relative: 7 %
Lymphs Abs: 0.5 10*3/uL — ABNORMAL LOW (ref 0.7–4.0)
MCH: 27.5 pg (ref 26.0–34.0)
MCHC: 31.6 g/dL (ref 30.0–36.0)
MCV: 87.1 fL (ref 80.0–100.0)
Monocytes Absolute: 0.5 10*3/uL (ref 0.1–1.0)
Monocytes Relative: 6 %
Neutro Abs: 6.7 10*3/uL (ref 1.7–7.7)
Neutrophils Relative %: 86 %
Platelets: 333 10*3/uL (ref 150–400)
RBC: 4.73 MIL/uL (ref 3.87–5.11)
RDW: 15.1 % (ref 11.5–15.5)
WBC: 7.8 10*3/uL (ref 4.0–10.5)
nRBC: 0 % (ref 0.0–0.2)

## 2022-06-23 LAB — RESP PANEL BY RT-PCR (RSV, FLU A&B, COVID)  RVPGX2
Influenza A by PCR: NEGATIVE
Influenza B by PCR: NEGATIVE
Resp Syncytial Virus by PCR: NEGATIVE
SARS Coronavirus 2 by RT PCR: NEGATIVE

## 2022-06-23 LAB — TROPONIN I (HIGH SENSITIVITY)
Troponin I (High Sensitivity): 10 ng/L (ref <18)
Troponin I (High Sensitivity): 11 ng/L (ref <18)

## 2022-06-23 LAB — BRAIN NATRIURETIC PEPTIDE: B Natriuretic Peptide: 38.2 pg/mL (ref 0.0–100.0)

## 2022-06-23 LAB — STREP PNEUMONIAE URINARY ANTIGEN: Strep Pneumo Urinary Antigen: NEGATIVE

## 2022-06-23 LAB — LACTIC ACID, PLASMA
Lactic Acid, Venous: 1.2 mmol/L (ref 0.5–1.9)
Lactic Acid, Venous: 1.2 mmol/L (ref 0.5–1.9)

## 2022-06-23 MED ORDER — ATORVASTATIN CALCIUM 20 MG PO TABS
20.0000 mg | ORAL_TABLET | Freq: Every day | ORAL | Status: DC
  Administered 2022-06-24 – 2022-06-25 (×2): 20 mg via ORAL
  Filled 2022-06-23 (×2): qty 1

## 2022-06-23 MED ORDER — AMLODIPINE BESYLATE 10 MG PO TABS
10.0000 mg | ORAL_TABLET | Freq: Every day | ORAL | Status: DC
  Administered 2022-06-24 – 2022-06-25 (×2): 10 mg via ORAL
  Filled 2022-06-23 (×2): qty 1

## 2022-06-23 MED ORDER — DICLOFENAC SODIUM 1 % EX GEL
2.0000 g | Freq: Four times a day (QID) | CUTANEOUS | Status: DC
  Filled 2022-06-23: qty 100

## 2022-06-23 MED ORDER — LORATADINE 10 MG PO TABS
10.0000 mg | ORAL_TABLET | Freq: Every day | ORAL | Status: DC
  Administered 2022-06-24 – 2022-06-25 (×2): 10 mg via ORAL
  Filled 2022-06-23 (×2): qty 1

## 2022-06-23 MED ORDER — GABAPENTIN 300 MG PO CAPS
600.0000 mg | ORAL_CAPSULE | Freq: Two times a day (BID) | ORAL | Status: DC
  Administered 2022-06-23 – 2022-06-25 (×4): 600 mg via ORAL
  Filled 2022-06-23 (×4): qty 2

## 2022-06-23 MED ORDER — SODIUM CHLORIDE 0.9 % IV SOLN
500.0000 mg | INTRAVENOUS | Status: DC
  Administered 2022-06-23: 500 mg via INTRAVENOUS
  Filled 2022-06-23 (×3): qty 5

## 2022-06-23 MED ORDER — ALPRAZOLAM 0.5 MG PO TABS
1.0000 mg | ORAL_TABLET | Freq: Every day | ORAL | Status: DC
  Administered 2022-06-23 – 2022-06-24 (×2): 1 mg via ORAL
  Filled 2022-06-23 (×2): qty 2

## 2022-06-23 MED ORDER — PANTOPRAZOLE SODIUM 40 MG PO TBEC
40.0000 mg | DELAYED_RELEASE_TABLET | Freq: Every day | ORAL | Status: DC
  Administered 2022-06-24 – 2022-06-25 (×2): 40 mg via ORAL
  Filled 2022-06-23 (×2): qty 1

## 2022-06-23 MED ORDER — SUCRALFATE 1 G PO TABS
1.0000 g | ORAL_TABLET | Freq: Three times a day (TID) | ORAL | Status: DC | PRN
  Administered 2022-06-24 – 2022-06-25 (×2): 1 g via ORAL
  Filled 2022-06-23 (×2): qty 1

## 2022-06-23 MED ORDER — IPRATROPIUM-ALBUTEROL 0.5-2.5 (3) MG/3ML IN SOLN
3.0000 mL | Freq: Four times a day (QID) | RESPIRATORY_TRACT | Status: DC
  Administered 2022-06-23 – 2022-06-24 (×5): 3 mL via RESPIRATORY_TRACT
  Filled 2022-06-23 (×6): qty 3

## 2022-06-23 MED ORDER — POTASSIUM CHLORIDE CRYS ER 20 MEQ PO TBCR
40.0000 meq | EXTENDED_RELEASE_TABLET | Freq: Once | ORAL | Status: AC
  Administered 2022-06-23: 40 meq via ORAL
  Filled 2022-06-23: qty 2

## 2022-06-23 MED ORDER — SODIUM CHLORIDE 0.9 % IV SOLN
1.0000 g | INTRAVENOUS | Status: DC
  Administered 2022-06-23: 1 g via INTRAVENOUS
  Filled 2022-06-23 (×2): qty 10

## 2022-06-23 MED ORDER — METHYLPREDNISOLONE SODIUM SUCC 125 MG IJ SOLR
125.0000 mg | Freq: Once | INTRAMUSCULAR | Status: AC
  Administered 2022-06-23: 125 mg via INTRAVENOUS
  Filled 2022-06-23: qty 2

## 2022-06-23 MED ORDER — ACETAMINOPHEN 650 MG RE SUPP: 650.0000 mg | Freq: Four times a day (QID) | RECTAL | Status: DC | PRN

## 2022-06-23 MED ORDER — LISINOPRIL 20 MG PO TABS
40.0000 mg | ORAL_TABLET | Freq: Every day | ORAL | Status: DC
  Administered 2022-06-24 – 2022-06-25 (×2): 40 mg via ORAL
  Filled 2022-06-23 (×2): qty 2

## 2022-06-23 MED ORDER — ACETAMINOPHEN 325 MG PO TABS
650.0000 mg | ORAL_TABLET | Freq: Four times a day (QID) | ORAL | Status: DC | PRN
  Administered 2022-06-23 – 2022-06-25 (×2): 650 mg via ORAL
  Filled 2022-06-23 (×2): qty 2

## 2022-06-23 MED ORDER — PROCHLORPERAZINE EDISYLATE 10 MG/2ML IJ SOLN: 10.0000 mg | Freq: Four times a day (QID) | INTRAMUSCULAR | Status: DC | PRN

## 2022-06-23 MED ORDER — AMLODIPINE BESYLATE 5 MG PO TABS: 5.0000 mg | ORAL_TABLET | Freq: Every day | ORAL | Status: DC

## 2022-06-23 MED ORDER — MAGNESIUM SULFATE 2 GM/50ML IV SOLN
2.0000 g | Freq: Once | INTRAVENOUS | Status: AC
  Administered 2022-06-23: 2 g via INTRAVENOUS
  Filled 2022-06-23 (×2): qty 50

## 2022-06-23 MED ORDER — NORTRIPTYLINE HCL 25 MG PO CAPS
25.0000 mg | ORAL_CAPSULE | Freq: Every day | ORAL | Status: DC
  Administered 2022-06-23 – 2022-06-24 (×2): 25 mg via ORAL
  Filled 2022-06-23 (×3): qty 1

## 2022-06-23 MED ORDER — PREDNISONE 20 MG PO TABS
40.0000 mg | ORAL_TABLET | Freq: Every day | ORAL | Status: DC
  Administered 2022-06-24 – 2022-06-25 (×2): 40 mg via ORAL
  Filled 2022-06-23 (×2): qty 2

## 2022-06-23 MED ORDER — LACTATED RINGERS IV SOLN: INTRAVENOUS | Status: DC

## 2022-06-23 NOTE — ED Provider Notes (Signed)
[place] [facility]GENCY DEPARTMENT [facility] Provider [facility]   CSN: [id number] Arrival date & time: 06/23/22  1008     History {Add pertinent medical, surgical, social history, OB history to HPI:1} Chief Complaint  Patient presents with   Weakness   Chest Pain   Shortness of Breath    Angie Olson is a 72 y.o. female.  72 year old female Angie Olson with 1 week of worsening cough and congestion along with dyspnea exertion.  Cough has been nonproductive.  Denies any anginal type chest pain but does note chest tightness when taking a deep breath.  This weekend had rigors but did not take her temperature.  No urinary symptoms.  No vomiting or diarrhea.  Per nursing does have a history of anemia secondary GI bleed.  Patient denies any current dark stools       Home Medications Prior to Admission medications   Medication Sig Start Date End Date Taking? Authorizing Provider  alendronate (FOSAMAX) 70 MG tablet Take 70 mg by mouth once a week. 01/14/22   [provider]  ALPRAZolam Prudy Feeler(XANAX) 1 MG tablet Take 1 mg by mouth at bedtime.    [provider]  amLODipine (NORVASC) 5 MG tablet Take 5 mg by mouth in the morning. 01/04/15   [provider]  atorvastatin (LIPITOR) 20 MG tablet Take 20 mg by mouth in the morning. 05/20/11   [provider]  benzonatate (TESSALON) 100 MG capsule Take 100 mg by mouth 3 (three) times daily as needed. 03/21/22   [provider]  Calcium Carbonate-Vitamin D (CALCIUM-VITAMIN D3 PO) Take 1 tablet by mouth daily.    [provider]  Capsaicin-Menthol (SALONPAS GEL-PATCH HOT) 0.025-1.25 % PTCH Apply 1 patch topically daily as needed (pain).    [provider]  cetirizine (ZYRTEC) 10 MG tablet Take 10 mg by mouth in the morning.    [provider]  Chlorpheniramine-PSE-Ibuprofen (ADVIL ALLERGY SINUS) 2-30-200 MG TABS Take 1 tablet by mouth daily as needed (Sinus headache).    [provider]  diclofenac Sodium (VOLTAREN) 1 % GEL Apply 2 g topically 4 (four) times daily.    [provider]  dicyclomine (BENTYL) 20 MG tablet Take 1 tablet (20 mg total) by mouth 4 (four) times daily as needed for spasms. 10/01/21   Iva BoopGessner, Carl E, MD  esomeprazole (NEXIUM) 20 MG capsule Take 20 mg by mouth daily before breakfast.     [provider]  fluticasone (FLONASE) 50 MCG/ACT nasal spray SMARTSIG:1 Spray(s) Both Nares Twice Daily PRN 02/27/22   [provider]  gabapentin (NEURONTIN) 300 MG capsule Take 600 mg by mouth 2 (two) times daily. 01/06/19   [provider]  hydroxychloroquine (PLAQUENIL) 200 MG tablet Take 200 mg by mouth in the morning. 12/22/18   [provider]  leflunomide (ARAVA) 20 MG tablet Take 20 mg by mouth in the morning. 10/27/18   [provider]  lisinopril (PRINIVIL,ZESTRIL) 40 MG tablet Take 40 mg by mouth in the morning.    [provider]  Multiple Vitamins-Minerals (HAIR/SKIN/NAILS) CAPS Take 1 tablet by mouth daily.    [provider]  Multiple Vitamins-Minerals (MULTIVITAMIN WITH MINERALS) tablet Take 1 tablet by mouth daily.    [provider]  mupirocin ointment (BACTROBAN) 2 % Apply topically 2 (two) times daily. 02/12/22   [provider]  nortriptyline (PAMELOR) 25 MG capsule Take 25 mg by mouth at bedtime.  06/16/14   [provider]  predniSONE (DELTASONE) 5 MG tablet Take 5 mg by mouth daily with breakfast.    [provider]  sodium chloride 0.9 % nebulizer solution SMARTSIG:1 Milliliter(s) Via Inhaler Every 2 Hours PRN 02/28/22   [provider]  sucralfate (CARAFATE) 1 G tablet Take 1 g by mouth 3 (three) times daily as needed (indigestion).     [provider]      Allergies    Hydrocodone-acetaminophen, Methotrexate derivatives, Metoclopramide, Pseudoephedrine, Vicodin [hydrocodone-acetaminophen], Other, and Oxycodone     Review of Systems   Review of Systems  All other systems reviewed and are negative.   Physical Exam Updated Vital Signs BP (!) 165/94 (BP Location: Left Arm)   Pulse (!) 115   Temp 98.9 F (37.2 C) (Oral)   Resp 17   Wt 92 kg   SpO2 95%   BMI 34.81 kg/m  Physical Exam Vitals and nursing note reviewed.  Constitutional:      General: She is not in acute distress.    Appearance: Normal appearance. She is well-developed. She is not toxic-appearing.  HENT:     Head: Normocephalic and atraumatic.  Eyes:     General: Lids are normal.     Conjunctiva/sclera: Conjunctivae normal.     Pupils: Pupils are equal, round, and reactive to light.  Neck:     Thyroid: No thyroid mass.     Trachea: No tracheal deviation.  Cardiovascular:     Rate and Rhythm: Regular rhythm. Tachycardia present.     Heart sounds: Normal heart sounds. No murmur heard.    No gallop.  Pulmonary:     Effort: Tachypnea, prolonged expiration and respiratory distress present.     Breath sounds: Transmitted upper airway sounds present. No stridor. Examination of the right-upper field reveals decreased breath sounds and rhonchi. Examination of the left-upper field reveals decreased breath sounds and rhonchi. Decreased breath sounds and rhonchi present. No wheezing or rales.  Abdominal:     General: There is no distension.     Palpations: Abdomen is soft.     Tenderness: There is no abdominal tenderness. There is no rebound.  Musculoskeletal:        General: No tenderness. Normal range of motion.     Cervical back: Normal range of motion and neck supple.  Skin:    General: Skin is warm and dry.     Findings: No abrasion or rash.  Neurological:     Mental Status: She is alert and oriented to person, place, and time. Mental status is at baseline.     GCS: GCS eye subscore is 4. GCS verbal subscore is 5. GCS motor subscore is 6.     Cranial Nerves: No cranial nerve deficit.     Sensory: No sensory deficit.      Motor: Motor function is intact.  Psychiatric:        Attention and Perception: Attention normal.        Speech: Speech normal.        Behavior: Behavior normal.     ED Results / Procedures / Treatments   Labs (all labs ordered are listed, but only abnormal results are displayed) Labs Reviewed  RESP PANEL BY RT-PCR (RSV, FLU A&B, COVID)  [id number]  CBC WITH DIFFERENTIAL/PLATELET  COMPREHENSIVE METABOLIC PANEL  BRAIN NATRIURETIC PEPTIDE  TROPONIN I (HIGH SENSITIVITY)    EKG EKG Interpretation  Date/Time:  Monday June 23 2022 10:19:15 EDT Ventricular Rate:  116 PR Interval:  133 QRS Duration: 99 QT Interval:  329 QTC Calculation: 457 R Axis:   64 Text Interpretation: Sinus tachycardia Minimal ST depression, lateral leads Confirmed by Lorre Nick ([id number]) on 06/23/2022 10:33:41 AM  Radiology No results found.  Procedures Procedures  {Document cardiac monitor, telemetry assessment procedure when appropriate:1}  Medications Ordered in ED Medications  lactated ringers infusion (has no administration in time range)    ED Course/ Medical Decision Making/ A&P   {   Click here for ABCD2, HEART and other calculatorsREFRESH Note before signing :1}                          Medical Decision Making Amount and/or Complexity of Data Reviewed Labs: ordered. Radiology: ordered.  Risk Prescription drug management.   ***  {Document critical care time when appropriate:1} {Document review of labs and clinical decision tools ie heart score, Chads2Vasc2 etc:1}  {Document your independent review of radiology images, and any outside records:1} {Document your discussion with family members, caretakers, and with consultants:1} {Document social determinants of health affecting pt's care:1} {Document your decision making why or why not admission, treatments were needed:1} Final Clinical Impression(s) / ED Diagnoses Final diagnoses:  None    Rx / DC Orders ED Discharge Orders      None

## 2022-06-23 NOTE — Plan of Care (Signed)
  Problem: Activity: Goal: Ability to tolerate increased activity will improve Outcome: Progressing   Problem: Respiratory: Goal: Ability to maintain adequate ventilation will improve Outcome: Progressing Goal: Ability to maintain a clear airway will improve Outcome: Progressing   Problem: Education: Goal: Knowledge of General Education information will improve Description: Including pain rating scale, medication(s)/side effects and non-pharmacologic comfort measures Outcome: Progressing   

## 2022-06-23 NOTE — ED Notes (Signed)
ED TO INPATIENT HANDOFF REPORT  ED Nurse Name and Phone #: Joanna Hews Name/Age/Gender Angie Olson 72 y.o. female Room/Bed: WA03/WA03  Code Status   Code Status: Full Code  Home/SNF/Other Home Patient oriented to: self, place, time, and situation Is this baseline? Yes   Triage Complete: Triage complete  Chief Complaint CAP (community acquired pneumonia) [J18.9]  Triage Note C/o cough with generalized cp, sob, and weakness x3 days.  Labored respirations noted in triage.  Pt reports hx anemia r/t GI bleeding.  Denies rectal bleeding.    Allergies Allergies  Allergen Reactions   Hydrocodone-Acetaminophen     Other Reaction(s): Other, Other (See Comments)    "Makes me feel weird"   Methotrexate Derivatives Other (See Comments)    Causes recurrent infections  Other Reaction(s): Other, Other (See Comments)    Causes recurrent infections   Metoclopramide Other (See Comments)    insomnia  Other Reaction(s): Other, Other (See Comments)    insomnia   Pseudoephedrine Other (See Comments)    Makes jittery  Other Reaction(s): Other, Other (See Comments)    Makes jittery     Makes jittery   Vicodin [Hydrocodone-Acetaminophen] Other (See Comments)    "Makes me feel weird"   Other Other (See Comments)    Pollen / Closes Sinus  Other Reaction(s): Other (See Comments)    Pollen / Closes Sinus   Oxycodone Itching and Rash    Patient tolerated hydromorphone without reaction.    Level of Care/Admitting Diagnosis ED Disposition     ED Disposition  Admit   Condition  --   Comment  Hospital Area: [facility] [[id number]]  Level of Care: Progressive [102]  Admit to Progressive based on following criteria: RESPIRATORY PROBLEMS hypoxemic/hypercapnic respiratory failure that is responsive to NIPPV (BiPAP) or High Flow Nasal Cannula (6-80 lpm). Frequent assessment/intervention, no > Q2 hrs < Q4 hrs, to maintain oxygenation and pulmonary  hygiene.  May place patient in observation at [facility] or [facility] if equivalent level of care is available:: No  Covid Evaluation: Asymptomatic - no recent exposure (last 10 days) testing not required  Diagnosis: CAP (community acquired pneumonia) [161096]  Admitting Physician: Bobette Mo [[id number]]  Attending Physician: Bobette Mo [[id number]]          B Medical/Surgery History Past Medical History:  Diagnosis Date   Acid reflux    AKI (acute kidney injury) 07/18/2014   Allergic rhinitis    anesthetic complication    Respiratory arrest 02-03-19  Anxiety    PT'S SON DIED 03-Aug-2010, husband died in 2021-03-19Mother died in 2019-09-03  Arthritis    RA AND OA   Borderline hypercholesterolemia    on medication for TIA   Bronchitis    CHRONIC    Cancer    melanoma - face    Carpal tunnel syndrome 04/01/2022   Closed fracture of single ramus of right pubis 04/01/2022   Colitis 07/19/2014   Colon polyp    COVID-19 virus infection 02/17/2019   2019-02-03  Environmental allergies    Epidural hematoma 01/11/2014   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   GERD 12/07/2006   Qualifier: Diagnosis of   By: Claiborne Billings CMA, Jacqualynn       GERD (gastroesophageal reflux disease)    on PPI and carafte prn   H/O hiatal hernia    History of COVID-19 02-03-2019   History of kidney stones  Hypertension    IBS (irritable bowel syndrome)    Iron deficiency anemia 08/06/2021   Iron deficiency anemia due to chronic blood loss 08/21/2021   Irritable bowel syndrome 01/03/2020   LBP (low back pain)    Nosocomial pneumonia 02/07/2019   OA (osteoarthritis)    Left Shoulder   Pain    RIGHT KNEE - TORN MENISCUS AND ACL   Pneumonia    X 3  - LAST TIME WAS 2013   PONV (postoperative nausea and vomiting) 09/2012   sore throat after ankle surgery 6 yrs ago and severe ponv 09-2012   Pre-diabetes    patient denies - states it was "years ago and no follow up"    Pulmonary nodule seen on imaging study    Shortness of breath    with activity,bronchitis   Stroke 03/2011   TIA--PT EXPERIENCED NUMBNESS RT HAND AND ARM , HEADACHE AND NAUSEA. NO RESIDUAL PROBLEMS   Thrombocytosis 02/06/2019   Past Surgical History:  Procedure Laterality Date   ANKLE SURGERY  ~10 years ago   left   APPLICATION OF INTRAOPERATIVE CT SCAN N/A 03/27/2021   Procedure: APPLICATION OF INTRAOPERATIVE CT SCAN;  Surgeon: Donalee Citrin, MD;  Location: [facility] OR;  Service: Neurosurgery;  Laterality: N/A;   BACK SURGERY  2011   lower   CARPAL TUNNEL RELEASE Right 2021   CHOLECYSTECTOMY  ~15 years ago   COLONOSCOPY  2005,2009, 2013   COLONOSCOPY W/ POLYPECTOMY  10/23/2020   Small adenoma   HARDWARE REMOVAL N/A 04/07/2016   Procedure: HARDWARE REMOVAL;  Surgeon: Donalee Citrin, MD;  Location: [facility] OR;  Service: Neurosurgery;  Laterality: N/A;   HARDWARE REMOVAL N/A 08/01/2020   Procedure: Removal of hardware with cutting the rod below the Lumbar one screws and removal of bilateral Lumbar four screws;  Surgeon: Donalee Citrin, MD;  Location: [facility] OR;  Service: Neurosurgery;  Laterality: N/A;   HARVEST BONE GRAFT N/A 07/05/2014   Procedure: HARVEST ILIAC BONE GRAFT;  Surgeon: Donalee Citrin, MD;  Location: [facility];  Service: Neurosurgery;  Laterality: N/A;   KNEE ARTHROSCOPY Right 10/13/2012   Procedure: RIGHT KNEE ARTHROSCOPY WITH DEBRIDEMENT, CHONDROPLASTY;  Surgeon: Loanne Drilling, MD;  Location: [facility] ORS;  Service: Orthopedics;  Laterality: Right;   KNEE ARTHROSCOPY Right 03/02/2013   Procedure: RIGHT ARTHROSCOPY KNEE WITH MEDIAL MENISCAL  DEBRIDEMENT;  Surgeon: Loanne Drilling, MD;  Location: [facility] ORS;  Service: Orthopedics;  Laterality: Right;   LAMINECTOMY WITH POSTERIOR LATERAL ARTHRODESIS LEVEL 1 N/A 08/01/2020   Procedure: posterolateral and Situ fusion;  Surgeon: Donalee Citrin, MD;  Location: [facility] OR;  Service: Neurosurgery;  Laterality: N/A;   LAMINECTOMY WITH POSTERIOR LATERAL ARTHRODESIS LEVEL 1  N/A 04/01/2021   Procedure: REEXPLORATION OF LUMBAR FUSION FOR REMOVAL AND REPLACEMENT OF MALPOSITIONED RIGHT SACRAL ONE PEDICLE SCREW;  Surgeon: Donalee Citrin, MD;  Location: [facility] OR;  Service: Neurosurgery;  Laterality: N/A;   LAMINECTOMY WITH POSTERIOR LATERAL ARTHRODESIS LEVEL 4 N/A 01/23/2020   Procedure: Posterior lateral fusion - Thoracic Ten -Thoracic Eleven - Thoracic Eleven -Thoracic Twelve - Thoracic Twelve- Lumbar One - Lumbar One- Lumbar Two revision with removal globus;  Surgeon: Donalee Citrin, MD;  Location: [facility] OR;  Service: Neurosurgery;  Laterality: N/A;  Eleven - Thoracic Eleven -Thoracic Twelve - Thoracic Twelve- Lumbar One - Lumbar One- Lumbar Two revision with removal globus   LAMINECTOMY WITH POSTERIOR LATERAL ARTHRODESIS LEVEL 4 N/A 03/27/2021   Procedure: Posterior lateral fusion - Lumbar two-Lumbar three - Lumbar three-Lumbar four -  Lumbar four-Lumbar five - Lumbar five-Sacral one - Pelvis with navigation Airo;  Surgeon: Donalee Citrin, MD;  [facility] OR;  Service: Neurosurgery;  Laterality: N/A;   LUMBAR FUSION  07/05/2014   LUMBAR WOUND DEBRIDEMENT N/A 01/11/2014   Procedure: Irrigation and Debridement Lumbar Wound for hematoma;  Surgeon: Mariam Dollar, MD;  Location: [facility];  Service: Neurosurgery;  Laterality: N/A;   MAXIMUM ACCESS (MAS)POSTERIOR LUMBAR INTERBODY FUSION (PLIF) 1 LEVEL N/A 12/05/2013   Procedure: FOR MAXIMUM ACCESS (MAS) POSTERIOR LUMBAR INTERBODY FUSION (PLIF)LUMBAR FIVE-SACRAL-ONE,REMOVAL HARDWARE LUMBAR FOUR-FIVE;  Surgeon: Mariam Dollar, MD;  Location: [facility];  Service: Neurosurgery;  Laterality: N/A;   Mohr's procedure  02/2015   face   Right Hip arthroplasty     10/01/16 Dr. Lequita Halt   ROOT CANAL     SHOULDER SURGERY Left ~14 years ago   "scope"   thumb surgery Left ~14 years ago   x2   TOTAL HIP ARTHROPLASTY Left 04/25/2015   Procedure: LEFT TOTAL HIP ARTHROPLASTY ANTERIOR APPROACH;  Surgeon: Ollen Gross, MD;  Location: [facility];  Service:  Orthopedics;  Laterality: Left;   TOTAL HIP ARTHROPLASTY Right 10/01/2016   Procedure: RIGHT TOTAL HIP ARTHROPLASTY ANTERIOR APPROACH;  Surgeon: Ollen Gross, MD;  Location: [facility];  Service: Orthopedics;  Laterality: Right;   TOTAL KNEE ARTHROPLASTY Right 07/25/2013   Procedure: RIGHT TOTAL KNEE ARTHROPLASTY;  Surgeon: Loanne Drilling, MD;  Location: [facility];  Service: Orthopedics;  Laterality: Right;   TOTAL KNEE ARTHROPLASTY WITH REVISION COMPONENTS Right 05/28/2016   Procedure: RIGHT TIBIAL POLYETHYLENE KNEE REVISION;  Surgeon: Ollen Gross, MD;  Location: [facility];  Service: Orthopedics;  Laterality: Right;  requests   TRIGGER FINGER RELEASE  ~6 years ago   X2 ON RIGHT HAND   TUBAL LIGATION       A IV Location/Drains/Wounds Patient Lines/Drains/Airways Status     Active Line/Drains/Airways     Name Placement date Placement time Site Days   Peripheral IV 06/23/22 20 G Right Antecubital 06/23/22  1152  Antecubital  less than 1            Intake/Output Last 24 hours No intake or output data in the 24 hours ending 06/23/22 1431  Labs/Imaging Results for orders placed or performed during the hospital encounter of 06/23/22 (from the past 48 hour(s))  Resp panel by RT-PCR (RSV, Flu A&B, Covid) Anterior Nasal Swab     Status: None   Collection Time: 06/23/22 10:50 AM   Specimen: Anterior Nasal Swab  Result Value Ref Range   SARS Coronavirus 2 by RT PCR NEGATIVE NEGATIVE    Comment: (NOTE) SARS-CoV-2 target nucleic acids are NOT DETECTED.  The SARS-CoV-2 RNA is generally detectable in upper respiratory specimens during the acute phase of infection. The lowest concentration of SARS-CoV-2 viral copies this assay can detect is 138 copies/mL. A negative result does not preclude SARS-Cov-2 infection and should not be used as the sole basis for treatment or other patient management decisions. A negative result may occur with  improper specimen collection/handling,  submission of specimen other than nasopharyngeal swab, presence of viral mutation(s) within the areas targeted by this assay, and inadequate number of viral copies(<138 copies/mL). A negative result must be combined with clinical observations, patient history, and epidemiological information. The expected result is Negative.  Fact Sheet for Patients:  BloggerCourse.com  Fact Sheet for Healthcare Providers:  SeriousBroker.it  This test is no t yet approved or cleared by the [place]  FDA and  has been authorized for detection and/or diagnosis of SARS-CoV-2 by FDA under an Emergency Use Authorization (EUA). This EUA will remain  in effect (meaning this test can be used) for the duration of the COVID-19 declaration under Section 564(b)(1) of the Act, 21 U.S.C.section 360bbb-3(b)(1), unless the authorization is terminated  or revoked sooner.       Influenza A by PCR NEGATIVE NEGATIVE   Influenza B by PCR NEGATIVE NEGATIVE    Comment: (NOTE) The Xpert Xpress SARS-CoV-2/FLU/RSV plus assay is intended as an aid in the diagnosis of influenza from Nasopharyngeal swab specimens and should not be used as a sole basis for treatment. Nasal washings and aspirates are unacceptable for Xpert Xpress SARS-CoV-2/FLU/RSV testing.  Fact Sheet for Patients: BloggerCourse.comhttps://www.fda.gov/media/152166/download  Fact Sheet for Healthcare Providers: SeriousBroker.ithttps://www.fda.gov/media/152162/download  This test is not yet approved or cleared by the [place] FDA and has been authorized for detection and/or diagnosis of SARS-CoV-2 by FDA under an Emergency Use Authorization (EUA). This EUA will remain in effect (meaning this test can be used) for the duration of the COVID-19 declaration under Section 564(b)(1) of the Act, 21 U.S.C. section 360bbb-3(b)(1), unless the authorization is terminated or revoked.     Resp Syncytial Virus by PCR NEGATIVE NEGATIVE     Comment: (NOTE) Fact Sheet for Patients: BloggerCourse.comhttps://www.fda.gov/media/152166/download  Fact Sheet for Healthcare Providers: SeriousBroker.ithttps://www.fda.gov/media/152162/download  This test is not yet approved or cleared by the [place] FDA and has been authorized for detection and/or diagnosis of SARS-CoV-2 by FDA under an Emergency Use Authorization (EUA). This EUA will remain in effect (meaning this test can be used) for the duration of the COVID-19 declaration under Section 564(b)(1) of the Act, 21 U.S.C. section 360bbb-3(b)(1), unless the authorization is terminated or revoked.  Performed at [facility], [address] [place], [place] [address]   Lactic acid, plasma     Status: None   Collection Time: 06/23/22 10:50 AM  Result Value Ref Range   Lactic Acid, Venous 1.2 0.5 - 1.9 mmol/L    Comment: Performed at [facility], [address] [place], [place] [address]  CBC with Differential/Platelet     Status: Abnormal   Collection Time: 06/23/22 12:00 PM  Result Value Ref Range   WBC 7.8 4.0 - 10.5 K/uL   RBC 4.73 3.87 - 5.11 MIL/uL   Hemoglobin 13.0 12.0 - 15.0 g/dL   HCT 82.941.2 56.236.0 - 13.046.0 %   MCV 87.1 80.0 - 100.0 fL   MCH 27.5 26.0 - 34.0 pg   MCHC 31.6 30.0 - 36.0 g/dL   RDW 86.515.1 78.411.5 - 69.615.5 %   Platelets 333 150 - 400 K/uL   nRBC 0.0 0.0 - 0.2 %   Neutrophils Relative % 86 %   Neutro Abs 6.7 1.7 - 7.7 K/uL   Lymphocytes Relative 7 %   Lymphs Abs 0.5 (L) 0.7 - 4.0 K/uL   Monocytes Relative 6 %   Monocytes Absolute 0.5 0.1 - 1.0 K/uL   Eosinophils Relative 1 %   Eosinophils Absolute 0.1 0.0 - 0.5 K/uL   Basophils Relative 0 %   Basophils Absolute 0.0 0.0 - 0.1 K/uL   Immature Granulocytes 0 %   Abs Immature Granulocytes 0.03 0.00 - 0.07 K/uL    Comment: Performed at [facility], [address] [place], [place] [address]  Comprehensive metabolic panel     Status: Abnormal   Collection Time: 06/23/22 12:00 PM   Result Value  Ref Range   Sodium 143 135 - 145 mmol/L   Potassium 3.0 (L) 3.5 - 5.1 mmol/L   Chloride 103 98 - 111 mmol/L   CO2 28 22 - 32 mmol/L   Glucose, Bld 94 70 - 99 mg/dL    Comment: Glucose reference range applies only to samples taken after fasting for at least 8 hours.   BUN 19 8 - 23 mg/dL   Creatinine, Ser 5.88 0.44 - 1.00 mg/dL   Calcium 9.3 8.9 - 32.5 mg/dL   Total Protein 7.3 6.5 - 8.1 g/dL   Albumin 4.0 3.5 - 5.0 g/dL   AST 29 15 - 41 U/L   ALT 38 0 - 44 U/L   Alkaline Phosphatase 70 38 - 126 U/L   Total Bilirubin 0.5 0.3 - 1.2 mg/dL   GFR, Estimated >49 >82 mL/min    Comment: (NOTE) Calculated using the CKD-EPI Creatinine Equation (2021)    Anion gap 12 5 - 15    Comment: Performed at [facility], [address] [place], [place] [address]  Troponin I (High Sensitivity)     Status: None   Collection Time: 06/23/22 12:00 PM  Result Value Ref Range   Troponin I (High Sensitivity) 10 <18 ng/L    Comment: (NOTE) Elevated high sensitivity troponin I (hsTnI) values and significant  changes across serial measurements may suggest ACS but many other  chronic and acute conditions are known to elevate hsTnI results.  Refer to the "Links" section for chest pain algorithms and additional  guidance. Performed at [facility], [address] [place], [place] [address]   Brain natriuretic peptide     Status: None   Collection Time: 06/23/22 12:00 PM  Result Value Ref Range   B Natriuretic Peptide 38.2 0.0 - 100.0 pg/mL    Comment: Performed at [facility], [address] [place], [place] [address]   DG Chest Port 1 View  Result Date: 06/23/2022 CLINICAL DATA:  Cough and generalized chest pain. Shortness of breath and weakness. EXAM: PORTABLE CHEST 1 VIEW COMPARISON:  Chest radiographs 07/18/2014 and 11/25/2013 FINDINGS: Cardiac silhouette and mediastinal contours are within normal limits. Subtle right basilar  heterogeneous airspace opacity. The left lung appears clear. No pleural effusion or pneumothorax. No acute skeletal abnormality. IMPRESSION: Subtle right basilar heterogeneous airspace opacity, which may represent atelectasis or infection in the appropriate clinical setting. Electronically Signed   By: Neita Garnet M.D.   On: 06/23/2022 10:54    Pending Labs Unresulted Labs (From admission, onward)     Start     Ordered   06/24/22 0500  Comprehensive metabolic panel  Tomorrow morning,   R        06/23/22 1334   06/24/22 0500  CBC  Tomorrow morning,   R        06/23/22 1334   06/23/22 1332  Strep pneumoniae urinary antigen  (COPD / Pneumonia / Cellulitis / Lower Extremity Wound)  Once,   R        06/23/22 1334   06/23/22 1332  Expectorated Sputum Assessment w Gram Stain, Rflx to Resp Cult  (COPD / Pneumonia / Cellulitis / Lower Extremity Wound)  Once,   R        06/23/22 1334   06/23/22 1050  Culture, blood (Routine X 2) w Reflex to ID Panel  BLOOD CULTURE X 2,   R (with STAT occurrences)     Question:  Patient immune status  Answer:  Normal   06/23/22 1049   06/23/22 1050  Lactic acid, plasma  Now then every 2 hours,   R (with STAT occurrences)      06/23/22 1049            Vitals/Pain Today's Vitals   06/23/22 1025 06/23/22 1032 06/23/22 1330 06/23/22 1423  BP:  (!) 165/94 (!) 176/103   Pulse:  (!) 115 (!) 106   Resp:  17 16   Temp:  98.9 F (37.2 C)  98.8 F (37.1 C)  TempSrc:  Oral    SpO2:  95% 94%   Weight: 92 kg     PainSc: 5        Isolation Precautions No active isolations  Medications Medications  lactated ringers infusion ( Intravenous New Bag/Given 06/23/22 1155)  cefTRIAXone (ROCEPHIN) 1 g in sodium chloride 0.9 % 100 mL IVPB (1 g Intravenous New Bag/Given 06/23/22 1404)  azithromycin (ZITHROMAX) 500 mg in sodium chloride 0.9 % 250 mL IVPB (has no administration in time range)  magnesium sulfate IVPB 2 g 50 mL (has no administration in time range)   acetaminophen (TYLENOL) tablet 650 mg (has no administration in time range)    Or  acetaminophen (TYLENOL) suppository 650 mg (has no administration in time range)  prochlorperazine (COMPAZINE) injection 10 mg (has no administration in time range)  potassium chloride SA (KLOR-CON M) CR tablet 40 mEq (40 mEq Oral Given 06/23/22 1413)    Mobility walks     Focused Assessments Weakness.   R Recommendations: See Admitting Provider Note  Report given to:   Additional Notes: .

## 2022-06-23 NOTE — ED Triage Notes (Signed)
C/o cough with generalized cp, sob, and weakness x3 days.  Labored respirations noted in triage.  Pt reports hx anemia r/t GI bleeding.  Denies rectal bleeding.

## 2022-06-23 NOTE — H&P (Signed)
History and Physical    Patient: Angie Olson DOB: 12/18/1950 DOA: 06/23/2022 DOS: the patient was seen and examined on 06/23/2022 PCP: Alysia PennaHolwerda, Scott, MD  Patient coming from: Home  Chief Complaint:  Chief Complaint  Patient presents with   Weakness   Chest Pain   Shortness of Breath   HPI: Angie Olson is a 72 y.o. female with medical history significant of GERD, anxiety, osteoarthritis, rheumatoid arthritis, hyperlipidemia, chronic bronchitis, facial melanoma, colon polyps, environmental allergies, GERD, hiatal hernia, COVID-19, nephrolithiasis, hypertension, IBS, chronic lower back pain, history of pneumonia x 3, prediabetes, pulmonary nodule, history of TIA presented to the emergency department complaints of pleuritic chest pain, dyspnea, wheezing, cough chills and malaise that is occasionally productive and chills for the past 3 days. Denied fever, sore throat or hemoptysis.  No chest pain, palpitations, diaphoresis, PND, orthopnea or pitting edema of the lower extremities.  No abdominal pain, nausea, emesis, diarrhea, constipation, melena or hematochezia.  No flank pain, dysuria, frequency or hematuria.  No polyuria, polydipsia, polyphagia or blurred vision.   Lab work: Her CBC is her white count 7.8, hemoglobin 13.0 g/dL platelets [id number].  Lactic acid was normal x 2.  BNP and troponin x 2 normal.  Coronavirus and influenza PCR negative.  CMP was normal except for potassium level 3.0 mmol/L  Imaging: Portable 1 view chest radiograph showed subtle right basilar heterogenous airspace opacity, which may represent atelectasis or infection in the appropriate clinical setting.  ED course: Initial vital signs were temperature 99.1 F, pulse 150, respiration 18, BP 171/90 mmHg O2 sat 92% on room air.  The patient was started on ceftriaxone and azithromycin in the emergency department.   Review of Systems: As mentioned in the history of present illness. All other systems  reviewed and are negative. Past Medical History:  Diagnosis Date   Acid reflux    Allergic rhinitis    anesthetic complication    Respiratory arrest November 2020   Anxiety    PT'S SON DIED 2012, husband died in March 2021, Mother died in June 2021   Arthritis    RA AND OA   Borderline hypercholesterolemia    on medication for TIA   Bronchitis    CHRONIC    Cancer    melanoma - face    Colon polyp    Environmental allergies    GERD (gastroesophageal reflux disease)    on PPI and carafte prn   H/O hiatal hernia    History of COVID-19 01/2019   History of kidney stones    Hypertension    IBS (irritable bowel syndrome)    LBP (low back pain)    OA (osteoarthritis)    Left Shoulder   Pain    RIGHT KNEE - TORN MENISCUS AND ACL   Pneumonia    X 3  - LAST TIME WAS 2013   PONV (postoperative nausea and vomiting) 09-2012   sore throat after ankle surgery 6 yrs ago and severe ponv 09-2012   Pre-diabetes    patient denies - states it was "years ago and no follow up"   Pulmonary nodule seen on imaging study    Shortness of breath    with activity,bronchitis   Stroke 03/2011   TIA--PT EXPERIENCED NUMBNESS RT HAND AND ARM , HEADACHE AND NAUSEA. NO RESIDUAL PROBLEMS   Past Surgical History:  Procedure Laterality Date   ANKLE SURGERY  ~10 years ago   left   APPLICATION OF INTRAOPERATIVE CT SCAN N/A 03/27/2021  Procedure: APPLICATION OF INTRAOPERATIVE CT SCAN;  Surgeon: Donalee Citrin, MD;  Location: [facility] OR;  Service: Neurosurgery;  Laterality: N/A;   BACK SURGERY  2011   lower   CARPAL TUNNEL RELEASE Right 2021   CHOLECYSTECTOMY  ~15 years ago   COLONOSCOPY  2005,2009, 2013   COLONOSCOPY W/ POLYPECTOMY  10/23/2020   Small adenoma   HARDWARE REMOVAL N/A 04/07/2016   Procedure: HARDWARE REMOVAL;  Surgeon: Donalee Citrin, MD;  Location: [facility] OR;  Service: Neurosurgery;  Laterality: N/A;   HARDWARE REMOVAL N/A 08/01/2020   Procedure: Removal of hardware with cutting the rod below the Lumbar  one screws and removal of bilateral Lumbar four screws;  Surgeon: Donalee Citrin, MD;  Location[facility] OR;  Service: Neurosurgery;  Laterality: N/A;   HARVEST BONE GRAFT N/A 07/05/2014   Procedure: HARVEST ILIAC BONE GRAFT;  Surgeon: Donalee Citrin, MD;  Location: [facility];  Service: Neurosurgery;  Laterality: N/A;   KNEE ARTHROSCOPY Right 10/13/2012   Procedure: RIGHT KNEE ARTHROSCOPY WITH DEBRIDEMENT, CHONDROPLASTY;  Surgeon: Loanne Drilling, MD;  Location: [facility]S;  Service: Orthopedics;  Laterality: Right;   KNEE ARTHROSCOPY Right 03/02/2013   Procedure: RIGHT ARTHROSCOPY KNEE WITH MEDIAL MENISCAL  DEBRIDEMENT;  Surgeon: Loanne Drilling, MD;  Location: [facility];  Service: Orthopedics;  Laterality: Right;   LAMINECTOMY WITH POSTERIOR LATERAL ARTHRODESIS LEVEL 1 N/A 08/01/2020   Procedure: posterolateral and Situ fusion;  Surgeon: Donalee Citrin, MD;  Location: [facility] OR;  Service: Neurosurgery;  Laterality: N/A;   LAMINECTOMY WITH POSTERIOR LATERAL ARTHRODESIS LEVEL 1 N/A 04/01/2021   Procedure: REEXPLORATION OF LUMBAR FUSION FOR REMOVAL AND REPLACEMENT OF MALPOSITIONED RIGHT SACRAL ONE PEDICLE SCREW;  Surgeon: Donalee Citrin, MD;  Location: [facility] OR;  Service: Neurosurgery;  Laterality: N/A;   LAMINECTOMY WITH POSTERIOR LATERAL ARTHRODESIS LEVEL 4 N/A 01/23/2020   Procedure: Posterior lateral fusion - Thoracic Ten -Thoracic Eleven - Thoracic Eleven -Thoracic Twelve - Thoracic Twelve- Lumbar One - Lumbar One- Lumbar Two revision with removal globus;  Surgeon: Donalee Citrin, MD;  Location: [facility] OR;  Service: Neurosurgery;  Laterality: N/A;  Eleven - Thoracic Eleven -Thoracic Twelve - Thoracic Twelve- Lumbar One - Lumbar One- Lumbar Two revision with removal globus   LAMINECTOMY WITH POSTERIOR LATERAL ARTHRODESIS LEVEL 4 N/A 03/27/2021   Procedure: Posterior lateral fusion - Lumbar two-Lumbar three - Lumbar three-Lumbar four - Lumbar four-Lumbar five - Lumbar five-Sacral one - Pelvis with navigation Airo;  Surgeon: Donalee Citrin, MD;   Location: [facility] OR;  Service: Neurosurgery;  Laterality: N/A;   LUMBAR FUSION  07/05/2014   LUMBAR WOUND DEBRIDEMENT N/A 01/11/2014   Procedure: Irrigation and Debridement Lumbar Wound for hematoma;  Surgeon: Mariam Dollar, MD;  Location: [facility];  Service: Neurosurgery;  Laterality: N/A;   MAXIMUM ACCESS (MAS)POSTERIOR LUMBAR INTERBODY FUSION (PLIF) 1 LEVEL N/A 12/05/2013   Procedure: FOR MAXIMUM ACCESS (MAS) POSTERIOR LUMBAR INTERBODY FUSION (PLIF)LUMBAR FIVE-SACRAL-ONE,REMOVAL HARDWARE LUMBAR FOUR-FIVE;  Surgeon: Mariam Dollar, MD;  Location: [facility];  Service: Neurosurgery;  Laterality: N/A;   Mohr's procedure  02/2015   face   Right Hip arthroplasty     10/01/16 Dr. Lequita Halt   ROOT CANAL     SHOULDER SURGERY Left ~14 years ago   "scope"   thumb surgery Left ~14 years ago   x2   TOTAL HIP ARTHROPLASTY Left 04/25/2015   Procedure: LEFT TOTAL HIP ARTHROPLASTY ANTERIOR APPROACH;  Surgeon: Ollen Gross, MD;  Location: [facility];  Service: Orthopedics;  Laterality: Left;  TOTAL HIP ARTHROPLASTY Right 10/01/2016   Procedure: RIGHT TOTAL HIP ARTHROPLASTY ANTERIOR APPROACH;  Surgeon: Ollen Gross, MD;  Location: [facility];  Service: Orthopedics;  Laterality: Right;   TOTAL KNEE ARTHROPLASTY Right 07/25/2013   Procedure: RIGHT TOTAL KNEE ARTHROPLASTY;  Surgeon: Loanne Drilling, MD;  Location: [facility];  Service: Orthopedics;  Laterality: Right;   TOTAL KNEE ARTHROPLASTY WITH REVISION COMPONENTS Right 05/28/2016   Procedure: RIGHT TIBIAL POLYETHYLENE KNEE REVISION;  Surgeon: Ollen Gross, MD;  Location: [facility];  Service: Orthopedics;  Laterality: Right;  requests   TRIGGER FINGER RELEASE  ~6 years ago   X2 ON RIGHT HAND   TUBAL LIGATION     Social History:  reports that she has never smoked. She has never used smokeless tobacco. She reports that she does not drink alcohol and does not use drugs.  Allergies  Allergen Reactions   Hydrocodone-Acetaminophen     Other Reaction(s): Other,  Other (See Comments)    "Makes me feel weird"   Methotrexate Derivatives Other (See Comments)    Causes recurrent infections  Other Reaction(s): Other, Other (See Comments)    Causes recurrent infections   Metoclopramide Other (See Comments)    insomnia  Other Reaction(s): Other, Other (See Comments)    insomnia   Pseudoephedrine Other (See Comments)    Makes jittery  Other Reaction(s): Other, Other (See Comments)    Makes jittery     Makes jittery   Vicodin [Hydrocodone-Acetaminophen] Other (See Comments)    "Makes me feel weird"   Other Other (See Comments)    Pollen / Closes Sinus  Other Reaction(s): Other (See Comments)    Pollen / Closes Sinus   Oxycodone Itching and Rash    Patient tolerated hydromorphone without reaction.    Family History  Problem Relation Age of Onset   Heart disease Sister    Lung cancer Brother    Heart disease Brother    CAD Brother    Heart attack Mother    Emphysema Father    Hypertension Daughter    Colon cancer Neg Hx     Prior to Admission medications   Medication Sig Start Date End Date Taking? Authorizing Provider  alendronate (FOSAMAX) 70 MG tablet Take 70 mg by mouth once a week. 01/14/22   [provider]  ALPRAZolam Prudy Feeler) 1 MG tablet Take 1 mg by mouth at bedtime.    [provider]  amLODipine (NORVASC) 5 MG tablet Take 5 mg by mouth in the morning. 01/04/15   [provider]  atorvastatin (LIPITOR) 20 MG tablet Take 20 mg by mouth in the morning. 05/20/11   [provider]  benzonatate (TESSALON) 100 MG capsule Take 100 mg by mouth 3 (three) times daily as needed. 03/21/22   [provider]  Calcium Carbonate-Vitamin D (CALCIUM-VITAMIN D3 PO) Take 1 tablet by mouth daily.    [provider]  Capsaicin-Menthol (SALONPAS GEL-PATCH HOT) 0.025-1.25 % PTCH Apply 1 patch topically daily as needed (pain).    [provider]  cetirizine (ZYRTEC) 10 MG tablet Take 10  mg by mouth in the morning.    [provider]  Chlorpheniramine-PSE-Ibuprofen (ADVIL ALLERGY SINUS) 2-30-200 MG TABS Take 1 tablet by mouth daily as needed (Sinus headache).    [provider]  diclofenac Sodium (VOLTAREN) 1 % GEL Apply 2 g topically 4 (four) times daily.    [provider]  dicyclomine (BENTYL) 20 MG tablet Take 1 tablet (20 mg total) by mouth  4 (four) times daily as needed for spasms. 10/01/21   Iva Boop, MD  esomeprazole (NEXIUM) 20 MG capsule Take 20 mg by mouth daily before breakfast.     [provider]  fluticasone (FLONASE) 50 MCG/ACT nasal spray SMARTSIG:1 Spray(s) Both Nares Twice Daily PRN 02/27/22   [provider]  gabapentin (NEURONTIN) 300 MG capsule Take 600 mg by mouth 2 (two) times daily. 01/06/19   [provider]  hydroxychloroquine (PLAQUENIL) 200 MG tablet Take 200 mg by mouth in the morning. 12/22/18   [provider]  leflunomide (ARAVA) 20 MG tablet Take 20 mg by mouth in the morning. 10/27/18   [provider]  lisinopril (PRINIVIL,ZESTRIL) 40 MG tablet Take 40 mg by mouth in the morning.    [provider]  Multiple Vitamins-Minerals (HAIR/SKIN/NAILS) CAPS Take 1 tablet by mouth daily.    [provider]  Multiple Vitamins-Minerals (MULTIVITAMIN WITH MINERALS) tablet Take 1 tablet by mouth daily.    [provider]  mupirocin ointment (BACTROBAN) 2 % Apply topically 2 (two) times daily. 02/12/22   [provider]  nortriptyline (PAMELOR) 25 MG capsule Take 25 mg by mouth at bedtime.  06/16/14   [provider]  predniSONE (DELTASONE) 5 MG tablet Take 5 mg by mouth daily with breakfast.    [provider]  sodium chloride 0.9 % nebulizer solution SMARTSIG:1 Milliliter(s) Via Inhaler Every 2 Hours PRN 02/28/22   [provider]  sucralfate (CARAFATE) 1 G tablet Take 1 g by mouth 3 (three) times daily as needed  (indigestion).     [provider]    Physical Exam: Vitals:   06/23/22 1017 06/23/22 1025 06/23/22 1032  BP: (!) 171/90  (!) 165/94  Pulse: (!) 115  (!) 115  Resp: 18  17  Temp: 99.1 F (37.3 C)  98.9 F (37.2 C)  TempSrc: Oral  Oral  SpO2: 92%  95%  Weight:  92 kg    Physical Exam Vitals and nursing note reviewed.  Constitutional:      General: She is awake.     Appearance: She is well-developed.  HENT:     Head: Normocephalic.     Nose: Nose normal. No rhinorrhea.     Mouth/Throat:     Mouth: Mucous membranes are dry.  Eyes:     Pupils: Pupils are equal, round, and reactive to light.  Neck:     Vascular: No JVD.  Cardiovascular:     Rate and Rhythm: Regular rhythm. Tachycardia present.     Heart sounds: S1 normal and S2 normal.  Pulmonary:     Effort: No accessory muscle usage.     Breath sounds: Examination of the right-lower field reveals rales. Examination of the left-lower field reveals rales. Decreased breath sounds, wheezing, rhonchi and rales present.  Abdominal:     General: Bowel sounds are normal. There is no distension.     Palpations: Abdomen is soft.     Tenderness: There is no abdominal tenderness. There is no guarding.  Musculoskeletal:     Cervical back: Neck supple.     Right lower leg: No edema.     Left lower leg: No edema.  Skin:    General: Skin is warm and dry.  Neurological:     General: No focal deficit present.     Mental Status: She is alert and oriented to person, place, and time.  Psychiatric:        Mood and Affect: Mood  normal.        Behavior: Behavior normal. Behavior is cooperative.    Data Reviewed:  Results are pending, will review when available.  EKG: Vent. rate 116 BPM PR interval 133 ms QRS duration 99 ms QT/QTcB 329/457 ms P-R-T axes 6 64 62 Sinus tachycardia Minimal ST depression, lateral leads  Assessment and Plan: Principal Problem:   CAP (community acquired pneumonia) Admit to  PCU/inpatient. Continue supplemental oxygen. Scheduled and as needed bronchodilators. Continue ceftriaxone 1 g IVPB daily. Continue azithromycin 500 mg IVPB daily. Solumedrol 125 mg IVPx1. Prednisone 40 mg po daily starting tomorrow. Check strep pneumoniae urinary antigen. Check sputum Gram stain, culture and sensitivity. Follow-up blood culture and sensitivity. Follow-up CBC and chemistry in the morning.  Active Problems:   GERD Continue Nexium or formulary equivalent.    HTN (hypertension) Continue amlodipine 10 mg PO daily.    Hypokalemia Replacing. Follow up potassium level in AM. Magnesium was supplemented.    Hyperlipidemia Continue atorvastatin 20 mg po daily.     Advance Care Planning:   Code Status: Full Code   Consults:   Family Communication:   Severity of Illness: The appropriate patient status for this patient is INPATIENT. Inpatient status is judged to be reasonable and necessary in order to provide the required intensity of service to ensure the patient's safety. The patient's presenting symptoms, physical exam findings, and initial radiographic and laboratory data in the context of their chronic comorbidities is felt to place them at high risk for further clinical deterioration. Furthermore, it is not anticipated that the patient will be medically stable for discharge from the hospital within 2 midnights of admission.   * I certify that at the point of admission it is my clinical judgment that the patient will require inpatient hospital care spanning beyond 2 midnights from the point of admission due to high intensity of service, high risk for further deterioration and high frequency of surveillance required.*  Author: Bobette Mo, MD 06/23/2022 1:26 PM  For on call review [email or web address].   This document was prepared using Dragon voice recognition software and may contain some unintended transcription errors.

## 2022-06-24 DIAGNOSIS — M069 Rheumatoid arthritis, unspecified: Secondary | ICD-10-CM | POA: Diagnosis present

## 2022-06-24 DIAGNOSIS — Z79899 Other long term (current) drug therapy: Secondary | ICD-10-CM | POA: Diagnosis not present

## 2022-06-24 DIAGNOSIS — E669 Obesity, unspecified: Secondary | ICD-10-CM | POA: Diagnosis present

## 2022-06-24 DIAGNOSIS — J208 Acute bronchitis due to other specified organisms: Secondary | ICD-10-CM

## 2022-06-24 DIAGNOSIS — R0902 Hypoxemia: Secondary | ICD-10-CM | POA: Diagnosis present

## 2022-06-24 DIAGNOSIS — Z1152 Encounter for screening for COVID-19: Secondary | ICD-10-CM | POA: Diagnosis not present

## 2022-06-24 DIAGNOSIS — Z8616 Personal history of COVID-19: Secondary | ICD-10-CM | POA: Diagnosis not present

## 2022-06-24 DIAGNOSIS — Z8701 Personal history of pneumonia (recurrent): Secondary | ICD-10-CM | POA: Diagnosis not present

## 2022-06-24 DIAGNOSIS — J42 Unspecified chronic bronchitis: Secondary | ICD-10-CM | POA: Diagnosis present

## 2022-06-24 DIAGNOSIS — Z7952 Long term (current) use of systemic steroids: Secondary | ICD-10-CM | POA: Diagnosis not present

## 2022-06-24 DIAGNOSIS — Z8249 Family history of ischemic heart disease and other diseases of the circulatory system: Secondary | ICD-10-CM | POA: Diagnosis not present

## 2022-06-24 DIAGNOSIS — F419 Anxiety disorder, unspecified: Secondary | ICD-10-CM | POA: Diagnosis present

## 2022-06-24 DIAGNOSIS — J123 Human metapneumovirus pneumonia: Secondary | ICD-10-CM | POA: Diagnosis present

## 2022-06-24 DIAGNOSIS — E7800 Pure hypercholesterolemia, unspecified: Secondary | ICD-10-CM | POA: Diagnosis present

## 2022-06-24 DIAGNOSIS — Z825 Family history of asthma and other chronic lower respiratory diseases: Secondary | ICD-10-CM | POA: Diagnosis not present

## 2022-06-24 DIAGNOSIS — B9781 Human metapneumovirus as the cause of diseases classified elsewhere: Secondary | ICD-10-CM | POA: Diagnosis present

## 2022-06-24 DIAGNOSIS — Z634 Disappearance and death of family member: Secondary | ICD-10-CM | POA: Diagnosis not present

## 2022-06-24 DIAGNOSIS — Z801 Family history of malignant neoplasm of trachea, bronchus and lung: Secondary | ICD-10-CM | POA: Diagnosis not present

## 2022-06-24 DIAGNOSIS — E876 Hypokalemia: Secondary | ICD-10-CM

## 2022-06-24 DIAGNOSIS — I10 Essential (primary) hypertension: Secondary | ICD-10-CM | POA: Diagnosis present

## 2022-06-24 DIAGNOSIS — Z8673 Personal history of transient ischemic attack (TIA), and cerebral infarction without residual deficits: Secondary | ICD-10-CM | POA: Diagnosis not present

## 2022-06-24 DIAGNOSIS — Z7983 Long term (current) use of bisphosphonates: Secondary | ICD-10-CM | POA: Diagnosis not present

## 2022-06-24 DIAGNOSIS — Z6832 Body mass index (BMI) 32.0-32.9, adult: Secondary | ICD-10-CM | POA: Diagnosis not present

## 2022-06-24 DIAGNOSIS — J129 Viral pneumonia, unspecified: Secondary | ICD-10-CM | POA: Diagnosis not present

## 2022-06-24 DIAGNOSIS — J189 Pneumonia, unspecified organism: Secondary | ICD-10-CM | POA: Diagnosis present

## 2022-06-24 DIAGNOSIS — Z85820 Personal history of malignant melanoma of skin: Secondary | ICD-10-CM | POA: Diagnosis not present

## 2022-06-24 DIAGNOSIS — K219 Gastro-esophageal reflux disease without esophagitis: Secondary | ICD-10-CM | POA: Diagnosis present

## 2022-06-24 LAB — RESPIRATORY PANEL BY PCR

## 2022-06-24 LAB — COMPREHENSIVE METABOLIC PANEL
ALT: 29 U/L (ref 0–44)
AST: 20 U/L (ref 15–41)
Albumin: 3.2 g/dL — ABNORMAL LOW (ref 3.5–5.0)
Alkaline Phosphatase: 59 U/L (ref 38–126)
Anion gap: 9 (ref 5–15)
BUN: 15 mg/dL (ref 8–23)
CO2: 26 mmol/L (ref 22–32)
Calcium: 8.7 mg/dL — ABNORMAL LOW (ref 8.9–10.3)
Chloride: 105 mmol/L (ref 98–111)
Creatinine, Ser: 0.74 mg/dL (ref 0.44–1.00)
GFR, Estimated: >60 mL/min (ref >60)
Glucose, Bld: 189 mg/dL — ABNORMAL HIGH (ref 70–99)
Potassium: 3.7 mmol/L (ref 3.5–5.1)
Sodium: 140 mmol/L (ref 135–145)
Total Bilirubin: 0.4 mg/dL (ref 0.3–1.2)
Total Protein: 6.3 g/dL — ABNORMAL LOW (ref 6.5–8.1)

## 2022-06-24 LAB — EXPECTORATED SPUTUM ASSESSMENT W GRAM STAIN, RFLX TO RESP C

## 2022-06-24 LAB — CULTURE, RESPIRATORY W GRAM STAIN

## 2022-06-24 LAB — PROCALCITONIN: Procalcitonin: <0.1 ng/mL

## 2022-06-24 MED ORDER — GUAIFENESIN ER 600 MG PO TB12
600.0000 mg | ORAL_TABLET | Freq: Two times a day (BID) | ORAL | Status: DC
  Administered 2022-06-24 – 2022-06-25 (×3): 600 mg via ORAL
  Filled 2022-06-24 (×3): qty 1

## 2022-06-24 MED ORDER — GUAIFENESIN-DM 100-10 MG/5ML PO SYRP: 5.0000 mL | ORAL_SOLUTION | ORAL | Status: DC | PRN

## 2022-06-24 MED ORDER — IPRATROPIUM-ALBUTEROL 0.5-2.5 (3) MG/3ML IN SOLN
3.0000 mL | Freq: Three times a day (TID) | RESPIRATORY_TRACT | Status: DC
  Administered 2022-06-25 (×2): 3 mL via RESPIRATORY_TRACT
  Filled 2022-06-24: qty 3

## 2022-06-24 MED ORDER — ADULT MULTIVITAMIN W/MINERALS CH
1.0000 | ORAL_TABLET | Freq: Every day | ORAL | Status: DC
  Administered 2022-06-24 – 2022-06-25 (×2): 1 via ORAL
  Filled 2022-06-24 (×2): qty 1

## 2022-06-24 MED ORDER — ALBUTEROL SULFATE (2.5 MG/3ML) 0.083% IN NEBU: 2.5000 mg | INHALATION_SOLUTION | RESPIRATORY_TRACT | Status: DC | PRN

## 2022-06-24 MED ORDER — AZITHROMYCIN 250 MG PO TABS
250.0000 mg | ORAL_TABLET | Freq: Every day | ORAL | Status: DC
  Administered 2022-06-24 – 2022-06-25 (×2): 250 mg via ORAL
  Filled 2022-06-24 (×2): qty 1

## 2022-06-24 NOTE — Evaluation (Signed)
Physical Therapy Evaluation Patient Details Name: Angie Olson MRN: [id number] DOB: 10/14/1950 Today's Date: 06/24/2022  History of Present Illness  72 yo female admitted with Pna. Hx of multiple back surgeries, R THA, TKA, anxiety, melanoma  Clinical Impression  On eval, pt was Supv level for mobility. She walked ~75 feet with her rollator. She gets dyspneic with minimal activity. O2 94% on RA. She reports weakness -has had multiple illnesses and hospital admissions since December 2023. She plans to return to her condo where she lives alone. She could benefit from post acute rehab if she is agreeable to it. Will plan to follow pt during this hospital stay.        Recommendations for follow up therapy are one component of a multi-disciplinary discharge planning process, led by the attending physician.  Recommendations may be updated based on patient status, additional functional criteria and insurance authorization.  Follow Up Recommendations       Assistance Recommended at Discharge PRN  Patient can return home with the following  Help with stairs or ramp for entrance    Equipment Recommendations None recommended by PT  Recommendations for Other Services       Functional Status Assessment Patient has had a recent decline in their functional status and demonstrates the ability to make significant improvements in function in a reasonable and predictable amount of time.     Precautions / Restrictions Precautions Precautions: Fall Precaution Comments: monitor O2 Restrictions Weight Bearing Restrictions: No      Mobility  Bed Mobility Overal bed mobility: Modified Independent                  Transfers Overall transfer level: Needs assistance Equipment used: None Transfers: Sit to/from Stand Sit to Stand: Supervision                Ambulation/Gait Ambulation/Gait assistance: Supervision Gait Distance (Feet): 75 Feet Assistive device: Rollator (4  wheels)         General Gait Details: Dyspnea 2/4. O2 94% on RA. Fatigue after short distance. No LOB with RW use. Pt does report knee feels like it wants to give way sometimes  Stairs            Wheelchair Mobility    Modified Rankin (Stroke Patients Only)       Balance Overall balance assessment: Needs assistance         Standing balance support: Reliant on assistive device for balance, During functional activity Standing balance-Leahy Scale: Fair                               Pertinent Vitals/Pain Pain Assessment Pain Assessment: Faces Faces Pain Scale: Hurts little more Pain Location: generalized Pain Descriptors / Indicators: Discomfort, Aching, Sore Pain Intervention(s): Limited activity within patient's tolerance, Monitored during session    Home Living Family/patient expects to be discharged to:: Private residence Living Arrangements: Alone Available Help at Discharge: Family;Friend(s);Available PRN/intermittently Type of Home:  (Condo) Home Access: Level entry       Home Layout: One level Home Equipment: Rollator (4 wheels);Shower seat - built in;Grab bars - tub/shower      Prior Function Prior Level of Function : Independent/Modified Independent             Mobility Comments: uses rollator       Hand Dominance        Extremity/Trunk Assessment   Upper Extremity Assessment Upper Extremity  Assessment: Overall WFL for tasks assessed    Lower Extremity Assessment Lower Extremity Assessment: Generalized weakness    Cervical / Trunk Assessment Cervical / Trunk Assessment: Normal;Kyphotic  Communication   Communication: No difficulties  Cognition Arousal/Alertness: Awake/alert Behavior During Therapy: WFL for tasks assessed/performed Overall Cognitive Status: Within Functional Limits for tasks assessed                                          General Comments      Exercises      Assessment/Plan    PT Assessment Patient needs continued PT services  PT Problem List Decreased balance;Decreased activity tolerance;Decreased mobility       PT Treatment Interventions Functional mobility training;Balance training;Patient/family education;Therapeutic activities;Gait training;Therapeutic exercise    PT Goals (Current goals can be found in the Care Plan section)  Acute Rehab PT Goals Patient Stated Goal: home; regain PLOF PT Goal Formulation: With patient Time For Goal Achievement: 07/08/22 Potential to Achieve Goals: Good    Frequency Min 1X/week     Co-evaluation               AM-PAC PT "6 Clicks" Mobility  Outcome Measure Help needed turning from your back to your side while in a flat bed without using bedrails?: None Help needed moving from lying on your back to sitting on the side of a flat bed without using bedrails?: None Help needed moving to and from a bed to a chair (including a wheelchair)?: None Help needed standing up from a chair using your arms (e.g., wheelchair or bedside chair)?: None Help needed to walk in hospital room?: A Little Help needed climbing 3-5 steps with a railing? : A Little 6 Click Score: 22    End of Session Equipment Utilized During Treatment: Gait belt Activity Tolerance: Patient tolerated treatment well Patient left: in bed;with call bell/phone within reach (respiratory in room)   PT Visit Diagnosis: Difficulty in walking, not elsewhere classified (R26.2)    Time: 7493-5521 PT Time Calculation (min) (ACUTE ONLY): 17 min   Charges:   PT Evaluation $PT Eval Low Complexity: 1 Low             Faye Ramsay, PT Acute Rehabilitation  Office: [phone]

## 2022-06-24 NOTE — Progress Notes (Addendum)
PROGRESS NOTE   LADAN VANDERZANDEN  ZOX:[id number]    DOB: Dec 20, 1950    DOA: 06/23/2022  PCP: Alysia Penna, MD   I have briefly reviewed patients previous medical records in [facility].  Chief Complaint  Patient presents with   Weakness   Chest Pain   Shortness of Breath    Brief Narrative:  72 year old female, lives alone, ambulates with the help of a walker, medical history significant for GERD, anxiety, HLD, chronic bronchitis (lifelong non-smoker), environmental allergies, HTN, recurrent pneumonia, prediabetes, TIA, rheumatoid arthritis, recent hospitalization in [place], [place] approximately 3 weeks ago for reported stomach bug/norovirus, started noticing cough at time of discharge from the hospital then, progressively got worse and presented to the ED on 06/23/2022 with complaints of nonproductive cough, wheezing, dyspnea, chest pain on coughing, chills, malaise, decreased appetite x 3 days.  Denies sickly contacts.  Admitted for suspected acute viral bronchitis, less likely to be pneumonia.  Slowly improving.   Assessment & Plan:  Principal Problem:   CAP (community acquired pneumonia) Active Problems:   GERD   HTN (hypertension)   Hypokalemia   Hyperlipidemia   Suspected acute viral bronchitis complicating chronic bronchitis: Although initially admitted as pneumonia, chest x-ray on admission showed subtle right basilar airspace opacity which could be atelectasis in the absence of fever or leukocytosis.  Moreover procalcitonin is negative.  Empirically started IV ceftriaxone discontinued.  Changed azithromycin to 250 Mg daily x 4 days to complete course in case she has superadded bacterial infection.  Checking extended RVP panel.  Influenza A, B, RSV and SARS coronavirus 2 by RT-PCR, urinary pneumococcal antigen, serial lactate, troponin x 2 and BNP negative.  Blood cultures x 2 negative to date.  Continue prednisone (eventually needs to be back on her home dose of prednisone  5 Mg daily), supportive care including scheduled and as needed nebs, Claritin, mucolytic's.  Improving but still wheezing.  PT evaluation. Added flutter valve.  Hypokalemia: Replaced.  Hypertension: Was uncontrolled yesterday, better today.  Continue amlodipine 10 Mg daily and lisinopril 40 Mg daily.  Added as needed IV hydralazine.  Hyperlipidemia: Continue atorvastatin 20 Mg daily.  Anxiety: Continue Xanax 1 mg and nortriptyline to 5 Mg daily at bedtime.  GERD: Continue PPI and as needed sucralfate.  Rheumatoid arthritis: Appears to be on prednisone 5 Mg daily, leflunomide and Plaquenil at home.  Currently on high-dose prednisone as noted above.  Leflunomide and Plaquenil held and can be resumed at time of discharge.  Body mass index is 32.63 kg/m./Obesity Complicates care.  Outpatient follow-up.    DVT prophylaxis: SCDs Start: 06/23/22 1331     Code Status: Full Code:  ACP Documents: None present. Family Communication: Patient declined MDs offer to speak with family to update care.  She states that she has been updating them herself.  Her spouse and children are deceased. Disposition:  Status is: Observation The patient will require care spanning > 2 midnights and should be moved to inpatient because: Ongoing wheezing, continuing steroids, antibiotics, close monitoring.     Consultants:     Procedures:     Antimicrobials:   As above.   Subjective:  Feels better.  Cough mostly dry.  No dyspnea.  Still wheezing but better than yesterday.  No chest pain or fevers.  Wants diet changed from heart healthy to regular.  Also urinating frequently and wants to know if IV fluids can be discontinued.  Objective:   Vitals:   06/23/22 1957 06/24/22 0524 06/24/22  0739 06/24/22 0945  BP: (!) 182/97 (!) 168/96  (!) 149/73  Pulse: (!) 101 83  93  Resp:      Temp: 98.7 F (37.1 C) 97.8 F (36.6 C)    TempSrc: Oral Oral    SpO2: 94% 94% 94%   Weight:      Height:         General exam: Elderly female, moderately built and nourished lying propped up in bed with intermittent harsh cough.  Does not appear in any distress. Respiratory system: Harsh and diminished breath sounds bilaterally, mostly posteriorly with bilateral medium pitched expiratory rhonchi but no crackles.  No increased work of breathing.  Able to speak in full sentences. Cardiovascular system: S1 & S2 heard, RRR. No JVD, murmurs, rubs, gallops or clicks. No pedal edema.  Telemetry personally reviewed: Sinus rhythm. Gastrointestinal system: Abdomen is nondistended, soft and nontender. No organomegaly or masses felt. Normal bowel sounds heard. Central nervous system: Alert and oriented. No focal neurological deficits. Extremities: Symmetric 5 x 5 power. Skin: No rashes, lesions or ulcers Psychiatry: Judgement and insight appear normal. Mood & affect appropriate.     Data Reviewed:   I have personally reviewed following labs and imaging studies   CBC: Recent Labs  Lab 06/23/22 1200  WBC 7.8  NEUTROABS 6.7  HGB 13.0  HCT 41.2  MCV 87.1  PLT 333    Basic Metabolic Panel: Recent Labs  Lab 06/23/22 1200 06/24/22 0551  NA 143 140  K 3.0* 3.7  CL 103 105  CO2 28 26  GLUCOSE 94 189*  BUN 19 15  CREATININE 0.84 0.74  CALCIUM 9.3 8.7*    Liver Function Tests: Recent Labs  Lab 06/23/22 1200 06/24/22 0551  AST 29 20  ALT 38 29  ALKPHOS 70 59  BILITOT 0.5 0.4  PROT 7.3 6.3*  ALBUMIN 4.0 3.2*    CBG: No results for input(s): "GLUCAP" in the last 168 hours.  Microbiology Studies:   Recent Results (from the past 240 hour(s))  Resp panel by RT-PCR (RSV, Flu A&B, Covid) Anterior Nasal Swab     Status: None   Collection Time: 06/23/22 10:50 AM   Specimen: Anterior Nasal Swab  Result Value Ref Range Status   SARS Coronavirus 2 by RT PCR NEGATIVE NEGATIVE Final    Comment: (NOTE) SARS-CoV-2 target nucleic acids are NOT DETECTED.  The SARS-CoV-2 RNA is generally  detectable in upper respiratory specimens during the acute phase of infection. The lowest concentration of SARS-CoV-2 viral copies this assay can detect is 138 copies/mL. A negative result does not preclude SARS-Cov-2 infection and should not be used as the sole basis for treatment or other patient management decisions. A negative result may occur with  improper specimen collection/handling, submission of specimen other than nasopharyngeal swab, presence of viral mutation(s) within the areas targeted by this assay, and inadequate number of viral copies(<138 copies/mL). A negative result must be combined with clinical observations, patient history, and epidemiological information. The expected result is Negative.  Fact Sheet for Patients:  BloggerCourse.com  Fact Sheet for Healthcare Providers:  SeriousBroker.it  This test is no t yet approved or cleared by the [place] FDA and  has been authorized for detection and/or diagnosis of SARS-CoV-2 by FDA under an Emergency Use Authorization (EUA). This EUA will remain  in effect (meaning this test can be used) for the duration of the COVID-19 declaration under Section 564(b)(1) of the Act, 21 U.S.C.section 360bbb-3(b)(1), unless the authorization is terminated  or revoked sooner.       Influenza A by PCR NEGATIVE NEGATIVE Final   Influenza B by PCR NEGATIVE NEGATIVE Final    Comment: (NOTE) The Xpert Xpress SARS-CoV-2/FLU/RSV plus assay is intended as an aid in the diagnosis of influenza from Nasopharyngeal swab specimens and should not be used as a sole basis for treatment. Nasal washings and aspirates are unacceptable for Xpert Xpress SARS-CoV-2/FLU/RSV testing.  Fact Sheet for Patients: BloggerCourse.comhttps://www.fda.gov/media/152166/download  Fact Sheet for Healthcare Providers: SeriousBroker.ithttps://www.fda.gov/media/152162/download  This test is not yet approved or cleared by the [place] FDA  and has been authorized for detection and/or diagnosis of SARS-CoV-2 by FDA under an Emergency Use Authorization (EUA). This EUA will remain in effect (meaning this test can be used) for the duration of the COVID-19 declaration under Section 564(b)(1) of the Act, 21 U.S.C. section 360bbb-3(b)(1), unless the authorization is terminated or revoked.     Resp Syncytial Virus by PCR NEGATIVE NEGATIVE Final    Comment: (NOTE) Fact Sheet for Patients: BloggerCourse.comhttps://www.fda.gov/media/152166/download  Fact Sheet for Healthcare Providers: SeriousBroker.ithttps://www.fda.gov/media/152162/download  This test is not yet approved or cleared by the [place] FDA and has been authorized for detection and/or diagnosis of SARS-CoV-2 by FDA under an Emergency Use Authorization (EUA). This EUA will remain in effect (meaning this test can be used) for the duration of the COVID-19 declaration under Section 564(b)(1) of the Act, 21 U.S.C. section 360bbb-3(b)(1), unless the authorization is terminated or revoked.  Performed at [facility], [address] [place], [place] [address]   Culture, blood (Routine X 2) w Reflex to ID Panel     Status: None (Preliminary result)   Collection Time: 06/23/22 10:55 AM   Specimen: BLOOD  Result Value Ref Range Status   Specimen Description   Final    BLOOD RIGHT ANTECUBITAL Performed at [facility], [address] [place], [place] [address]    Special Requests   Final    BOTTLES DRAWN AEROBIC AND ANAEROBIC Blood Culture adequate volume Performed at [facility], [address] [place], [place] [address]    Culture   Final    NO GROWTH < 24 HOURS Performed at [facility], [address] [place], [place] [address]    Report Status PENDING  Incomplete  Culture, blood (Routine X 2) w Reflex to ID Panel     Status: None (Preliminary result)   Collection Time: 06/23/22 12:00 PM   Specimen: BLOOD  Result Value  Ref Range Status   Specimen Description   Final    BLOOD RIGHT ANTECUBITAL Performed at [facility], [address] [place], [place] [address]    Special Requests   Final    BOTTLES DRAWN AEROBIC AND ANAEROBIC BACTEROIDES CACCAE Performed at [facility], [address] [place], [place] [address]    Culture   Final    NO GROWTH < 24 HOURS Performed at [facility], [address] [place], [place] [address]    Report Status PENDING  Incomplete    Radiology Studies:  DG Chest Port 1 View  Result Date: 06/23/2022 CLINICAL DATA:  Cough and generalized chest pain. Shortness of breath and weakness. EXAM: PORTABLE CHEST 1 VIEW COMPARISON:  Chest radiographs 07/18/2014 and 11/25/2013 FINDINGS: Cardiac silhouette and mediastinal contours are within normal limits. Subtle right basilar heterogeneous airspace opacity. The left lung appears clear. No pleural effusion or pneumothorax. No acute skeletal abnormality. IMPRESSION: Subtle right  basilar heterogeneous airspace opacity, which may represent atelectasis or infection in the appropriate clinical setting. Electronically Signed   By: Neita Garnet M.D.   On: 06/23/2022 10:54    Scheduled Meds:    ALPRAZolam  1 mg Oral QHS   amLODipine  10 mg Oral Daily   atorvastatin  20 mg Oral Daily   azithromycin  250 mg Oral Daily   diclofenac Sodium  2 g Topical QID   gabapentin  600 mg Oral BID   ipratropium-albuterol  3 mL Nebulization QID   lisinopril  40 mg Oral Daily   loratadine  10 mg Oral Daily   nortriptyline  25 mg Oral QHS   pantoprazole  40 mg Oral Daily   predniSONE  40 mg Oral Q breakfast    Continuous Infusions:     LOS: 0 days     Marcellus Scott, MD,  FACP, FHM, SFHM, [facility], [facility]   [facility] [place]     To contact the attending provider between 7A-7P or the covering provider during after hours 7P-7A, please log into the web site  [email or web address] and access using universal [place] password for that web site. If you do not have the password, please call the hospital operator.  06/24/2022, 10:29 AM

## 2022-06-25 ENCOUNTER — Other Ambulatory Visit (HOSPITAL_COMMUNITY): Payer: Self-pay

## 2022-06-25 ENCOUNTER — Encounter: Payer: Self-pay | Admitting: Hematology and Oncology

## 2022-06-25 DIAGNOSIS — J129 Viral pneumonia, unspecified: Secondary | ICD-10-CM

## 2022-06-25 MED ORDER — GUAIFENESIN ER 600 MG PO TB12
600.0000 mg | ORAL_TABLET | Freq: Two times a day (BID) | ORAL | 0 refills | Status: AC
  Filled 2022-06-25: qty 10, 5d supply, fill #0

## 2022-06-25 MED ORDER — PREDNISONE 20 MG PO TABS: ORAL_TABLET | ORAL | 0 refills | Status: DC

## 2022-06-25 MED ORDER — GUAIFENESIN ER 600 MG PO TB12: 600.0000 mg | ORAL_TABLET | Freq: Two times a day (BID) | ORAL | 0 refills | Status: DC

## 2022-06-25 MED ORDER — PREDNISONE 10 MG PO TABS
ORAL_TABLET | ORAL | 0 refills | Status: AC
  Filled 2022-06-25: qty 16, 7d supply, fill #0

## 2022-06-25 MED ORDER — AZITHROMYCIN 250 MG PO TABS: 250.0000 mg | ORAL_TABLET | Freq: Every day | ORAL | 0 refills | Status: DC

## 2022-06-25 MED ORDER — AZITHROMYCIN 250 MG PO TABS
250.0000 mg | ORAL_TABLET | Freq: Every day | ORAL | 0 refills | Status: AC
  Filled 2022-06-25: qty 2, 2d supply, fill #0

## 2022-06-25 MED ORDER — PREDNISONE 5 MG PO TABS: 5.0000 mg | ORAL_TABLET | Freq: Every day | ORAL | Status: AC

## 2022-06-25 NOTE — Hospital Course (Addendum)
YUI KIPNIS is a 72 y.o. female with a history of GERD, anxiety, hyperlipidemia, chronic bronchitis, hypertension, prediabetes, TIA, rheumatoid arthritis.  Patient presents secondary to cough and dyspnea with associated chest pain, chills, malaise.  Patient was found to have concern for commune acquired pneumonia on admission was started on empiric antibiotics.  During workup, patient presentation less concerning for bacterial source.  Respiratory virus panel was obtained and was significant for positive metapneumovirus.  Patient was placed on oxygen, however no hypoxia was documented.  Patient able to discharge home with home health services and to continue supportive care.

## 2022-06-25 NOTE — TOC Progression Note (Signed)
Transition of Care [facility]) - Progression Note    Patient Details  Name: Angie Olson MRN: [id number] Date of Birth: [birth year]  Transition of Care [facility]) CM/SW Contact  Geni Bers, RN Phone Number: 06/25/2022, 12:31 PM  Clinical Narrative:    Pt states that she will return home. Pt continues that she will not need to go to Rehab or have HH.  She will work on getting her strength back on her own. Neighbors are pt's support.    Expected Discharge Plan: Home/Self Care Barriers to Discharge: No Barriers Identified  Expected Discharge Plan and Services       Living arrangements for the past 2 months: Single Family Home                                       Social Determinants of Health (SDOH) Interventions SDOH Screenings   Food Insecurity: No Food Insecurity (06/23/2022)  Housing: Low Risk  (06/23/2022)  Transportation Needs: No Transportation Needs (06/23/2022)  Utilities: Not At Risk (06/23/2022)  Tobacco Use: Low Risk  (06/23/2022)    Readmission Risk Interventions    01/30/2020   11:58 AM  Readmission Risk Prevention Plan  Transportation Screening Complete  PCP or Specialist Appt within 5-7 Days Complete  Home Care Screening Complete  Medication Review (RN CM) Complete

## 2022-06-25 NOTE — Discharge Summary (Signed)
Physician Discharge Summary   Patient: Angie Olson MRN: [id number] DOB: 05/01/1950  Admit date:     06/23/2022  Discharge date: 06/25/22  Discharge Physician: Jacquelin Hawking, MD   PCP: Alysia Penna, MD   Recommendations at discharge:  PCP follow-up  Discharge Diagnoses: Principal Problem:   Viral pneumonia Active Problems:   GERD   HTN (hypertension)   Hypokalemia   Hyperlipidemia  Resolved Problems:   * No resolved hospital problems. *  Hospital Course: Angie Olson is a 72 y.o. female with a history of GERD, anxiety, hyperlipidemia, chronic bronchitis, hypertension, prediabetes, TIA, rheumatoid arthritis.  Patient presents secondary to cough and dyspnea with associated chest pain, chills, malaise.  Patient was found to have concern for commune acquired pneumonia on admission was started on empiric antibiotics.  During workup, patient presentation less concerning for bacterial source.  Respiratory virus panel was obtained and was significant for positive metapneumovirus.  Patient was placed on oxygen, however no hypoxia was documented.  Patient able to discharge home with home health services and to continue supportive care.  Assessment and Plan:  Viral pneumonia Patient with infiltrates on imaging with associated dyspnea and hypoxia.  Patient was initially managed as bacterial pneumonia with antibiotics weaned down to azithromycin monotherapy.  Respiratory virus panel was significant for positive metapneumovirus.  Continue supportive care on discharge.  Hypokalemia Mild.  Potassium down to 3.0.  Resolved with potassium supplementation.  Primary hypertension Continue home amlodipine and lisinopril.  Hyperlipidemia Continue Lipitor daily.  Anxiety Continue alprazolam at bedtime.  GERD Continue esomeprazole and sucralfate as needed.  Rheumatoid arthritis Resume home leflunomide and Plaquenil. Prednisone taper back down to home prednisone 5 mg  daily.  Obesity Estimated body mass index is 32.63 kg/m as calculated from the following:   Height as of this encounter: 5\' 4"  (1.626 m).   Weight as of this encounter: 86.2 kg.   Consultants: None Procedures performed: None Disposition: Home Diet recommendation: Cardiac diet   DISCHARGE MEDICATION: Allergies as of 06/25/2022       Reactions   Hydrocodone-acetaminophen Other (See Comments)      "Makes me feel weird"   Methotrexate Derivatives Other (See Comments)   Causes recurrent infections   Metoclopramide Other (See Comments)   insomnia   Pseudoephedrine Other (See Comments)   Makes jittery   Vicodin [hydrocodone-acetaminophen] Other (See Comments)   "Makes me feel weird"   Molds & Smuts Other (See Comments)   Congestion    Other Other (See Comments)   Pollen / Closes Sinus   Oxycodone Itching, Rash   Patient tolerated hydromorphone without reaction.        Medication List     STOP taking these medications    doxycycline 100 MG capsule Commonly known as: VIBRAMYCIN   vancomycin 125 MG capsule Commonly known as: VANCOCIN       TAKE these medications    acetaminophen 500 MG tablet Commonly known as: TYLENOL Take 1,000 mg by mouth as needed for moderate pain.   albuterol 108 (90 Base) MCG/ACT inhaler Commonly known as: VENTOLIN HFA Inhale 1 puff into the lungs every 6 (six) hours as needed for wheezing or shortness of breath.   alendronate 70 MG tablet Commonly known as: FOSAMAX Take 70 mg by mouth once a week.   ALPRAZolam 1 MG tablet Commonly known as: XANAX Take 1 mg by mouth at bedtime.   amLODipine 10 MG tablet Commonly known as: NORVASC Take 10 mg by mouth daily. What changed:  Another medication with the same name was removed. Continue taking this medication, and follow the directions you see here.   atorvastatin 20 MG tablet Commonly known as: LIPITOR Take 20 mg by mouth in the morning.   azithromycin 250 MG tablet Commonly  known as: ZITHROMAX Take 1 tablet (250 mg total) by mouth daily for 2 days. Start taking on: June 26, 2022   benzonatate 100 MG capsule Commonly known as: TESSALON Take 100 mg by mouth 3 (three) times daily as needed for cough.   CALCIUM-VITAMIN D3 PO Take 1 tablet by mouth daily.   cetirizine 10 MG tablet Commonly known as: ZYRTEC Take 10 mg by mouth in the morning.   cholecalciferol 25 MCG (1000 UNIT) tablet Commonly known as: VITAMIN D3 Take 2,000 Units by mouth daily.   esomeprazole 20 MG capsule Commonly known as: NEXIUM Take 20 mg by mouth daily before breakfast.   gabapentin 300 MG capsule Commonly known as: NEURONTIN Take 600 mg by mouth 2 (two) times daily.   guaiFENesin 600 MG 12 hr tablet Commonly known as: MUCINEX Take 1 tablet (600 mg total) by mouth 2 (two) times daily for 5 days.   hydroxychloroquine 200 MG tablet Commonly known as: PLAQUENIL Take 200 mg by mouth in the morning.   ibuprofen 200 MG tablet Commonly known as: ADVIL Take 400 mg by mouth as needed for moderate pain.   leflunomide 20 MG tablet Commonly known as: ARAVA Take 20 mg by mouth in the morning.   lisinopril 40 MG tablet Commonly known as: ZESTRIL Take 40 mg by mouth in the morning.   multivitamin with minerals tablet Take 1 tablet by mouth daily.   Hair/Skin/Nails Caps Take 1 tablet by mouth daily.   nortriptyline 25 MG capsule Commonly known as: PAMELOR Take 25 mg by mouth at bedtime.   predniSONE 20 MG tablet Commonly known as: DELTASONE Take 2 tablets (40 mg total) by mouth daily with breakfast for 1 day, THEN 1.5 tablets (30 mg total) daily with breakfast for 2 days, THEN 1 tablet (20 mg total) daily with breakfast for 2 days, THEN 0.5 tablets (10 mg total) daily with breakfast for 2 days. Start taking on: June 26, 2022 What changed: You were already taking a medication with the same name, and this prescription was added. Make sure you understand how and when to  take each.   predniSONE 5 MG tablet Commonly known as: DELTASONE Take 1 tablet (5 mg total) by mouth daily with breakfast. Start taking on: July 03, 2022 What changed: These instructions start on July 03, 2022. If you are unsure what to do until then, ask your doctor or other care provider.   PRESCRIPTION MEDICATION Place 1 drop into both eyes in the morning, at noon, in the evening, and at bedtime. Unknown eye drop   Salonpas Gel-Patch Hot 0.025-1.25 % Ptch Generic drug: Capsaicin-Menthol Apply 1 patch topically daily as needed (pain).   sucralfate 1 g tablet Commonly known as: CARAFATE Take 1 g by mouth as needed (indigestion).   Voltaren 1 % Gel Generic drug: diclofenac Sodium Apply 1 Application topically as needed (pain).        Follow-up Information     Alysia PennaHolwerda, [place], MD. Schedule an appointment as soon as possible for a visit in 1 week(s).   Specialty: Internal Medicine Why: For hospital follow-up Contact information: [address] [place] [id number] [phone]  Discharge Exam: BP (!) 177/97 (BP Location: Left Arm)   Pulse (!) 102   Temp 98.2 F (36.8 C) (Oral)   Resp 18   Ht 5\' 4"  (1.626 m)   Wt 86.2 kg   SpO2 95%   BMI 32.63 kg/m   General exam: Appears calm and comfortable Respiratory system: Mildly coarse with mild wheezing. Respiratory effort normal. Cardiovascular system: S1 & S2 heard, RRR. No murmurs. Gastrointestinal system: Abdomen is nondistended, soft and nontender. Normal bowel sounds heard. Central nervous system: Alert and oriented. No focal neurological deficits. Musculoskeletal: No calf tenderness Psychiatry: Judgement and insight appear normal. Mood & affect appropriate.   Condition at discharge: stable  The results of significant diagnostics from this hospitalization (including imaging, microbiology, ancillary and laboratory) are listed below for reference.   Imaging Studies: DG Chest Port 1  View  Result Date: 06/23/2022 CLINICAL DATA:  Cough and generalized chest pain. Shortness of breath and weakness. EXAM: PORTABLE CHEST 1 VIEW COMPARISON:  Chest radiographs 07/18/2014 and 11/25/2013 FINDINGS: Cardiac silhouette and mediastinal contours are within normal limits. Subtle right basilar heterogeneous airspace opacity. The left lung appears clear. No pleural effusion or pneumothorax. No acute skeletal abnormality. IMPRESSION: Subtle right basilar heterogeneous airspace opacity, which may represent atelectasis or infection in the appropriate clinical setting. Electronically Signed   By: Neita Garnet M.D.   On: 06/23/2022 10:54    Microbiology: Results for orders placed or performed during the hospital encounter of 06/23/22  Resp panel by RT-PCR (RSV, Flu A&B, Covid) Anterior Nasal Swab     Status: None   Collection Time: 06/23/22 10:50 AM   Specimen: Anterior Nasal Swab  Result Value Ref Range Status   SARS Coronavirus 2 by RT PCR NEGATIVE NEGATIVE Final    Comment: (NOTE) SARS-CoV-2 target nucleic acids are NOT DETECTED.  The SARS-CoV-2 RNA is generally detectable in upper respiratory specimens during the acute phase of infection. The lowest concentration of SARS-CoV-2 viral copies this assay can detect is 138 copies/mL. A negative result does not preclude SARS-Cov-2 infection and should not be used as the sole basis for treatment or other patient management decisions. A negative result may occur with  improper specimen collection/handling, submission of specimen other than nasopharyngeal swab, presence of viral mutation(s) within the areas targeted by this assay, and inadequate number of viral copies(<138 copies/mL). A negative result must be combined with clinical observations, patient history, and epidemiological information. The expected result is Negative.  Fact Sheet for Patients:  BloggerCourse.com  Fact Sheet for Healthcare Providers:   SeriousBroker.it  This test is no t yet approved or cleared by the [place] FDA and  has been authorized for detection and/or diagnosis of SARS-CoV-2 by FDA under an Emergency Use Authorization (EUA). This EUA will remain  in effect (meaning this test can be used) for the duration of the COVID-19 declaration under Section 564(b)(1) of the Act, 21 U.S.C.section 360bbb-3(b)(1), unless the authorization is terminated  or revoked sooner.       Influenza A by PCR NEGATIVE NEGATIVE Final   Influenza B by PCR NEGATIVE NEGATIVE Final    Comment: (NOTE) The Xpert Xpress SARS-CoV-2/FLU/RSV plus assay is intended as an aid in the diagnosis of influenza from Nasopharyngeal swab specimens and should not be used as a sole basis for treatment. Nasal washings and aspirates are unacceptable for Xpert Xpress SARS-CoV-2/FLU/RSV testing.  Fact Sheet for Patients: BloggerCourse.com  Fact Sheet for Healthcare Providers: SeriousBroker.it  This test is not yet approved or  cleared by the [place] and has been authorized for detection and/or diagnosis of SARS-CoV-2 by FDA under an Emergency Use Authorization (EUA). This EUA will remain in effect (meaning this test can be used) for the duration of the COVID-19 declaration under Section 564(b)(1) of the Act, 21 U.S.C. section 360bbb-3(b)(1), unless the authorization is terminated or revoked.     Resp Syncytial Virus by PCR NEGATIVE NEGATIVE Final    Comment: (NOTE) Fact Sheet for Patients: BloggerCourse.com  Fact Sheet for Healthcare Providers: SeriousBroker.it  This test is not yet approved or cleared by the [place] FDA and has been authorized for detection and/or diagnosis of SARS-CoV-2 by FDA under an Emergency Use Authorization (EUA). This EUA will remain in effect (meaning this test can be used) for  the duration of the COVID-19 declaration under Section 564(b)(1) of the Act, 21 U.S.C. section 360bbb-3(b)(1), unless the authorization is terminated or revoked.  Performed at [facility], [address] [place], [place] [address]   Culture, blood (Routine X 2) w Reflex to ID Panel     Status: None (Preliminary result)   Collection Time: 06/23/22 10:55 AM   Specimen: BLOOD  Result Value Ref Range Status   Specimen Description   Final    BLOOD RIGHT ANTECUBITAL Performed at [facility], [address], [place], [place] [address]    Special Requests   Final    BOTTLES DRAWN AEROBIC AND ANAEROBIC Blood Culture adequate volume Performed at [facility], [address], [place], [place] [address]    Culture   Final    NO GROWTH 2 DAYS Performed at [facility], [address], [place], [place] [address]    Report Status PENDING  Incomplete  Culture, blood (Routine X 2) w Reflex to ID Panel     Status: None (Preliminary result)   Collection Time: 06/23/22 12:00 PM   Specimen: BLOOD  Result Value Ref Range Status   Specimen Description   Final    BLOOD RIGHT ANTECUBITAL Performed at [facility], [address], [place], [place] [address]    Special Requests   Final    BOTTLES DRAWN AEROBIC AND ANAEROBIC BACTEROIDES CACCAE Performed at [facility], [address] [place], [place] [address]    Culture   Final    NO GROWTH 2 DAYS Performed at [facility], [address], [place], [place] [address]    Report Status PENDING  Incomplete  Respiratory (~20 pathogens) panel by PCR     Status: Abnormal   Collection Time: 06/24/22 11:30 AM   Specimen: Nasopharyngeal Swab; Respiratory  Result Value Ref Range Status   Adenovirus NOT DETECTED NOT DETECTED Final   Coronavirus 229E NOT DETECTED NOT DETECTED Final    Comment: (NOTE) The Coronavirus on the Respiratory Panel, DOES  NOT test for the novel  Coronavirus (2019 nCoV)    Coronavirus HKU1 NOT DETECTED NOT DETECTED Final   Coronavirus NL63 NOT DETECTED NOT DETECTED Final   Coronavirus OC43 NOT DETECTED NOT DETECTED Final   Metapneumovirus DETECTED (A) NOT DETECTED Final   Rhinovirus / Enterovirus NOT DETECTED NOT DETECTED Final   Influenza A NOT DETECTED NOT DETECTED Final   Influenza B NOT DETECTED NOT DETECTED Final   Parainfluenza Virus 1 NOT DETECTED NOT DETECTED Final   Parainfluenza Virus 2 NOT DETECTED NOT DETECTED Final   Parainfluenza Virus 3 NOT DETECTED NOT DETECTED Final   Parainfluenza Virus  4 NOT DETECTED NOT DETECTED Final   Respiratory Syncytial Virus NOT DETECTED NOT DETECTED Final   Bordetella pertussis NOT DETECTED NOT DETECTED Final   Bordetella Parapertussis NOT DETECTED NOT DETECTED Final   Chlamydophila pneumoniae NOT DETECTED NOT DETECTED Final   Mycoplasma pneumoniae NOT DETECTED NOT DETECTED Final    Comment: Performed at [facility], [address], [place], [place] [address]  Expectorated Sputum Assessment w Gram Stain, Rflx to Resp Cult     Status: None   Collection Time: 06/24/22 11:31 AM   Specimen: Sputum  Result Value Ref Range Status   Specimen Description SPUTUM  Final   Special Requests NONE  Final   Sputum evaluation   Final    THIS SPECIMEN IS ACCEPTABLE FOR SPUTUM CULTURE Performed at [facility], [address] [place], [place] [address]    Report Status 06/24/2022 FINAL  Final  Culture, Respiratory w Gram Stain     Status: None (Preliminary result)   Collection Time: 06/24/22 11:31 AM   Specimen: SPU  Result Value Ref Range Status   Specimen Description   Final    SPUTUM Performed at [facility], [address], [place], [place] [address]    Special Requests   Final    NONE Reflexed from [phone] Performed at [facility], [address], [place], [place] [address]    Gram Stain   Final     FEW WBC PRESENT, PREDOMINANTLY PMN NO ORGANISMS SEEN    Culture   Final    CULTURE REINCUBATED FOR BETTER GROWTH Performed at [facility], [address] [place], [place] [address]    Report Status PENDING  Incomplete    Labs: CBC: Recent Labs  Lab 06/23/22 1200  WBC 7.8  NEUTROABS 6.7  HGB 13.0  HCT 41.2  MCV 87.1  PLT 333   Basic Metabolic Panel: Recent Labs  Lab 06/23/22 1200 06/24/22 0551  NA 143 140  K 3.0* 3.7  CL 103 105  CO2 28 26  GLUCOSE 94 189*  BUN 19 15  CREATININE 0.84 0.74  CALCIUM 9.3 8.7*   Liver Function Tests: Recent Labs  Lab 06/23/22 1200 06/24/22 0551  AST 29 20  ALT 38 29  ALKPHOS 70 59  BILITOT 0.5 0.4  PROT 7.3 6.3*  ALBUMIN 4.0 3.2*   Discharge time spent: 35 minutes.  Signed: Jacquelin Hawking, MD [facility] 06/25/2022

## 2022-06-25 NOTE — Discharge Instructions (Signed)
Angie Olson,  You are in the hospital because of pneumonia which was secondary to a viral illness.  You are initially managed with antibiotics.  Because this is a viral infection, you will need mostly supportive care.  Please follow-up with your primary care physician.  You have been set up with home health therapy as well.

## 2022-06-25 NOTE — Progress Notes (Signed)
Physical Therapy Treatment Patient Details Name: Angie Olson MRN: [id number] DOB: [birth year] Today's Date: 06/25/2022   SATURATION QUALIFICATIONS: (This note is used to comply with regulatory documentation for home oxygen)  Patient Saturations on Room Air at Rest = 95%  Patient Saturations on Room Air while Ambulating = 91%   History of Present Illness 72 yo female admitted with Pna. Hx of multiple back surgeries, R THA, TKA, anxiety, melanoma    PT Comments    Pt agreeable to therapy. She tolerated increased amb distance. Dyspnea 2/4 with brief standing rest rest break mid distance. Pt is hopeful she will be able to walk more-nursing and/or mobility team can assist with this.    Recommendations for follow up therapy are one component of a multi-disciplinary discharge planning process, led by the attending physician.  Recommendations may be updated based on patient status, additional functional criteria and insurance authorization.  Follow Up Recommendations       Assistance Recommended at Discharge PRN  Patient can return home with the following Help with stairs or ramp for entrance   Equipment Recommendations  None recommended by PT    Recommendations for Other Services       Precautions / Restrictions Precautions Precautions: Fall Precaution Comments: monitor O2 Restrictions Weight Bearing Restrictions: No     Mobility  Bed Mobility               General bed mobility comments: pt sitting EOB    Transfers Overall transfer level: Needs assistance Equipment used: Rollator (4 wheels) Transfers: Sit to/from Stand Sit to Stand: Supervision                Ambulation/Gait Ambulation/Gait assistance: Supervision Gait Distance (Feet): 134 Feet Assistive device: Rollator (4 wheels) Gait Pattern/deviations: Trunk flexed, Step-through pattern       General Gait Details: Dyspnea 2/4. O2 91% on RA.  No LOB with RW use. Pt does report knee feels like  it wants to give way sometimes. Some fatigue reported.   Stairs             Wheelchair Mobility    Modified Rankin (Stroke Patients Only)       Balance Overall balance assessment: Needs assistance         Standing balance support: Reliant on assistive device for balance, During functional activity Standing balance-Leahy Scale: Fair                              Cognition Arousal/Alertness: Awake/alert Behavior During Therapy: WFL for tasks assessed/performed Overall Cognitive Status: Within Functional Limits for tasks assessed                                          Exercises      General Comments        Pertinent Vitals/Pain Pain Assessment Pain Assessment: Faces Faces Pain Scale: Hurts a little bit Pain Location: generalized Pain Descriptors / Indicators: Discomfort, Aching, Sore Pain Intervention(s): Monitored during session    Home Living                          Prior Function            PT Goals (current goals can now be found in the care plan section) Progress towards PT goals: Progressing  toward goals    Frequency    Min 1X/week      PT Plan Current plan remains appropriate    Co-evaluation              AM-PAC PT "6 Clicks" Mobility   Outcome Measure  Help needed turning from your back to your side while in a flat bed without using bedrails?: None Help needed moving from lying on your back to sitting on the side of a flat bed without using bedrails?: None Help needed moving to and from a bed to a chair (including a wheelchair)?: None Help needed standing up from a chair using your arms (e.g., wheelchair or bedside chair)?: None Help needed to walk in hospital room?: A Little Help needed climbing 3-5 steps with a railing? : A Little 6 Click Score: 22    End of Session Equipment Utilized During Treatment: Gait belt Activity Tolerance: Patient tolerated treatment well Patient left:  in bed;with nursing/sitter in room   PT Visit Diagnosis: Difficulty in walking, not elsewhere classified (R26.2)     Time: 8677-3736 PT Time Calculation (min) (ACUTE ONLY): 13 min  Charges:  $Gait Training: 8-22 mins                        Faye Ramsay, PT Acute Rehabilitation  Office: [phone]

## 2022-06-25 NOTE — Progress Notes (Signed)
Patient discharging at this time.  Cardiac monitor removed and returned to nurses station.  Discharge instructions reviewed.  All questions answered and patient verbalized understanding.  Patient escorted off unit via wheelchair with all personal belongings.  Patient left campus via private vehicle driven by herself.

## 2022-06-26 LAB — CULTURE, RESPIRATORY W GRAM STAIN

## 2022-06-28 LAB — CULTURE, BLOOD (ROUTINE X 2)
Culture: NO GROWTH
Culture: NO GROWTH
Special Requests: ADEQUATE

## 2022-10-08 ENCOUNTER — Telehealth: Payer: Self-pay | Admitting: Hematology and Oncology

## 2022-10-14 ENCOUNTER — Other Ambulatory Visit: Payer: Self-pay | Admitting: *Deleted

## 2022-10-14 ENCOUNTER — Inpatient Hospital Stay: Payer: Medicare Other | Attending: Hematology and Oncology

## 2022-10-14 DIAGNOSIS — D500 Iron deficiency anemia secondary to blood loss (chronic): Secondary | ICD-10-CM

## 2022-10-14 LAB — CMP (CANCER CENTER ONLY)
ALT: 30 U/L (ref 0–44)
AST: 39 U/L (ref 15–41)
Albumin: 3.9 g/dL (ref 3.5–5.0)
Alkaline Phosphatase: 105 U/L (ref 38–126)
Anion gap: 8 (ref 5–15)
BUN: 11 mg/dL (ref 8–23)
CO2: 27 mmol/L (ref 22–32)
Calcium: 9.7 mg/dL (ref 8.9–10.3)
Chloride: 106 mmol/L (ref 98–111)
Creatinine: 0.77 mg/dL (ref 0.44–1.00)
GFR, Estimated: >60 mL/min (ref >60)
Glucose, Bld: 144 mg/dL — ABNORMAL HIGH (ref 70–99)
Potassium: 3.5 mmol/L (ref 3.5–5.1)
Sodium: 141 mmol/L (ref 135–145)
Total Bilirubin: 0.3 mg/dL (ref 0.3–1.2)
Total Protein: 6.2 g/dL — ABNORMAL LOW (ref 6.5–8.1)

## 2022-10-14 LAB — CBC WITH DIFFERENTIAL (CANCER CENTER ONLY)
Abs Immature Granulocytes: 0 10*3/uL (ref 0.00–0.07)
Basophils Absolute: 0 10*3/uL (ref 0.0–0.1)
Basophils Relative: 1 %
Eosinophils Absolute: 0.2 10*3/uL (ref 0.0–0.5)
Eosinophils Relative: 4 %
HCT: 34.9 % — ABNORMAL LOW (ref 36.0–46.0)
Hemoglobin: 11.4 g/dL — ABNORMAL LOW (ref 12.0–15.0)
Immature Granulocytes: 0 %
Lymphocytes Relative: 28 %
Lymphs Abs: 1.5 10*3/uL (ref 0.7–4.0)
MCH: 26.8 pg (ref 26.0–34.0)
MCHC: 32.7 g/dL (ref 30.0–36.0)
MCV: 81.9 fL (ref 80.0–100.0)
Monocytes Absolute: 0.6 10*3/uL (ref 0.1–1.0)
Monocytes Relative: 11 %
Neutro Abs: 3 10*3/uL (ref 1.7–7.7)
Neutrophils Relative %: 56 %
Platelet Count: 404 10*3/uL — ABNORMAL HIGH (ref 150–400)
RBC: 4.26 MIL/uL (ref 3.87–5.11)
RDW: 14.9 % (ref 11.5–15.5)
WBC Count: 5.4 10*3/uL (ref 4.0–10.5)
nRBC: 0 % (ref 0.0–0.2)

## 2022-10-14 LAB — IRON AND IRON BINDING CAPACITY (CC-WL,HP ONLY)
Iron: 45 ug/dL (ref 28–170)
Saturation Ratios: 12 % (ref 10.4–31.8)
TIBC: 388 ug/dL (ref 250–450)
UIBC: 343 ug/dL (ref 148–442)

## 2022-10-14 LAB — RETIC PANEL
Immature Retic Fract: 13.3 % (ref 2.3–15.9)
RBC.: 4.2 MIL/uL (ref 3.87–5.11)
Retic Count, Absolute: 57.1 10*3/uL (ref 19.0–186.0)
Retic Ct Pct: 1.4 % (ref 0.4–3.1)
Reticulocyte Hemoglobin: 28 pg (ref >27.9)

## 2022-10-14 LAB — FERRITIN: Ferritin: 44 ng/mL (ref 11–307)

## 2022-10-20 ENCOUNTER — Inpatient Hospital Stay: Payer: Medicare Other

## 2022-10-21 ENCOUNTER — Inpatient Hospital Stay: Payer: Medicare Other | Attending: Hematology and Oncology | Admitting: Hematology and Oncology

## 2022-10-21 VITALS — BP 153/80 | HR 100 | Temp 97.6°F | Resp 17 | Wt 195.3 lb

## 2022-10-21 DIAGNOSIS — Z801 Family history of malignant neoplasm of trachea, bronchus and lung: Secondary | ICD-10-CM | POA: Diagnosis not present

## 2022-10-21 DIAGNOSIS — D500 Iron deficiency anemia secondary to blood loss (chronic): Secondary | ICD-10-CM | POA: Diagnosis not present

## 2022-10-21 DIAGNOSIS — K922 Gastrointestinal hemorrhage, unspecified: Secondary | ICD-10-CM | POA: Insufficient documentation

## 2022-10-21 NOTE — Progress Notes (Addendum)
[facility] [phone]   [phone]  PROGRESS NOTE  Patient Care Team: Alysia Penna, MD as PCP - General (Internal Medicine)  Hematological/Oncological History # Microcytic Anemia  # Iron Deficiency Anemia 2/2 to GI Bleeding  03/25/2021: WBC 8.6, Hgb 10.5, MCV 78.7, Plt 389 08/21/2021: establish care with Dr. Leonides Schanz  6/12-7/12/2021: IV iron sucrose 200 mg x 5 doses  10/14/2021: White blood cell count 7.6, hemoglobin 12.3, MCV 81.4, and platelets of 297  Interval History:  Angie Olson 72 y.o. female with medical history significant for iron deficiency anemia secondary to GI bleeding who presents for a follow up visit. The patient's last visit was on 04/22/2022. In the interim since the last visit she underwent total knee arthroplasty on 09/10/2022 and was hospitalized in April 2024 for pneumonia.   On exam today [name] reports she has been feeling quite poorly after her knee surgery in 2022/09/24.  She reports her knee is working well and she is satisfied with the results, however she has been very fatigued and tired.  Her appetite has been poor.  She notes that she has had a very rough year.  In January and February she had 2 infections of COVID, 2022-06-24 she had norovirus, April she had a pneumonia with an admission to the hospital, and most recently her knee surgery in 09-24-2022.  She reports that she is not taking iron supplementation as it does cause her constipation.  She is doing her best to try to eat red meat but does not eat a whole lot.  Her appetite is as been poor.  She is having dizziness, brain fog, and cannot walk very far due to poor endurance.  Otherwise she has no questions concerns or complaints today.  She is not having any overt signs of bleeding such as nosebleeds, gum bleeding, or dark stools. She denies any fevers, chills, sweats, nausea, vomiting, or diarrhea.  A full 10 point ROS is listed below.  MEDICAL HISTORY:  Past Medical History:   Diagnosis Date   Acid reflux    AKI (acute kidney injury) (HCC) 07/18/2014   Allergic rhinitis    anesthetic complication    Respiratory arrest 02/24/2019  Anxiety    PT'S SON DIED 11/24/10, husband died in 2021-04-09Mother died in 09/24/2019  Arthritis    RA AND OA   Borderline hypercholesterolemia    on medication for TIA   Bronchitis    CHRONIC    Cancer (HCC)    melanoma - face    Carpal tunnel syndrome 04/01/2022   Closed fracture of single ramus of right pubis (HCC) 04/01/2022   Colitis 07/19/2014   Colon polyp    COVID-19 virus infection 02/17/2019   24-Feb-2019  Environmental allergies    Epidural hematoma (HCC) 01/11/2014   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   GERD 12/07/2006   Qualifier: Diagnosis of   By: Claiborne Billings CMA, Jacqualynn       GERD (gastroesophageal reflux disease)    on PPI and carafte prn   H/O hiatal hernia    History of COVID-19 2019/02/24   History of kidney stones    Hypertension    IBS (irritable bowel syndrome)    Iron deficiency anemia 08/06/2021   Iron deficiency anemia due to chronic blood loss 08/21/2021   Irritable bowel syndrome 01/03/2020   LBP (low back pain)    Nosocomial pneumonia 02/07/2019   OA (osteoarthritis)    Left  Shoulder   Pain    RIGHT KNEE - TORN MENISCUS AND ACL   Pneumonia    X 3  - LAST TIME WAS 2013   PONV (postoperative nausea and vomiting) 09/2012   sore throat after ankle surgery 6 yrs ago and severe ponv 09-2012   Pre-diabetes    patient denies - states it was "years ago and no follow up"   Pulmonary nodule seen on imaging study    Shortness of breath    with activity,bronchitis   Stroke (HCC) 03/2011   TIA--PT EXPERIENCED NUMBNESS RT HAND AND ARM , HEADACHE AND NAUSEA. NO RESIDUAL PROBLEMS   Thrombocytosis 02/06/2019    SURGICAL HISTORY: Past Surgical History:  Procedure Laterality Date   ANKLE SURGERY  ~10 years ago   left   APPLICATION OF INTRAOPERATIVE CT SCAN N/A 03/27/2021    Procedure: APPLICATION OF INTRAOPERATIVE CT SCAN;  Surgeon: Donalee Citrin, MD;  Location: [facility] OR;  Service: Neurosurgery;  Laterality: N/A;   BACK SURGERY  2011   lower   CARPAL TUNNEL RELEASE Right 2021   CHOLECYSTECTOMY  ~15 years ago   COLONOSCOPY  2005,2009, 2013   COLONOSCOPY W/ POLYPECTOMY  10/23/2020   Small adenoma   HARDWARE REMOVAL N/A 04/07/2016   Procedure: HARDWARE REMOVAL;  Surgeon: Donalee Citrin, MD;  Location: [facility] OR;  Service: Neurosurgery;  Laterality: N/A;   HARDWARE REMOVAL N/A 08/01/2020   Procedure: Removal of hardware with cutting the rod below the Lumbar one screws and removal of bilateral Lumbar four screws;  Surgeon: Donalee Citrin, MD;  Location: [facility] OR;  Service: Neurosurgery;  Laterality: N/A;   HARVEST BONE GRAFT N/A 07/05/2014   Procedure: HARVEST ILIAC BONE GRAFT;  Surgeon: Donalee Citrin, MD;  Location: [facility];  Service: Neurosurgery;  Laterality: N/A;   KNEE ARTHROSCOPY Right 10/13/2012   Procedure: RIGHT KNEE ARTHROSCOPY WITH DEBRIDEMENT, CHONDROPLASTY;  Surgeon: Loanne Drilling, MD;  Location: [facility] ORS;  Service: Orthopedics;  Laterality: Right;   KNEE ARTHROSCOPY Right 03/02/2013   Procedure: RIGHT ARTHROSCOPY KNEE WITH MEDIAL MENISCAL  DEBRIDEMENT;  Surgeon: Loanne Drilling, MD;  Location: [facility] ORS;  Service: Orthopedics;  Laterality: Right;   LAMINECTOMY WITH POSTERIOR LATERAL ARTHRODESIS LEVEL 1 N/A 08/01/2020   Procedure: posterolateral and Situ fusion;  Surgeon: Donalee Citrin, MD;  Location: [facility] OR;  Service: Neurosurgery;  Laterality: N/A;   LAMINECTOMY WITH POSTERIOR LATERAL ARTHRODESIS LEVEL 1 N/A 04/01/2021   Procedure: REEXPLORATION OF LUMBAR FUSION FOR REMOVAL AND REPLACEMENT OF MALPOSITIONED RIGHT SACRAL ONE PEDICLE SCREW;  Surgeon: Donalee Citrin, MD;  Location: [facility] OR;  Service: Neurosurgery;  Laterality: N/A;   LAMINECTOMY WITH POSTERIOR LATERAL ARTHRODESIS LEVEL 4 N/A 01/23/2020   Procedure: Posterior lateral fusion - Thoracic Ten -Thoracic Eleven - Thoracic Eleven  -Thoracic Twelve - Thoracic Twelve- Lumbar One - Lumbar One- Lumbar Two revision with removal globus;  Surgeon: Donalee Citrin, MD;  Location: [facility] OR;  Service: Neurosurgery;  Laterality: N/A;  Eleven - Thoracic Eleven -Thoracic Twelve - Thoracic Twelve- Lumbar One - Lumbar One- Lumbar Two revision with removal globus   LAMINECTOMY WITH POSTERIOR LATERAL ARTHRODESIS LEVEL 4 N/A 03/27/2021   Procedure: Posterior lateral fusion - Lumbar two-Lumbar three - Lumbar three-Lumbar four - Lumbar four-Lumbar five - Lumbar five-Sacral one - Pelvis with navigation Airo;  Surgeon: Donalee Citrin, MD;  Location: [facility] OR;  Service: Neurosurgery;  Laterality: N/A;   LUMBAR FUSION  07/05/2014   LUMBAR WOUND DEBRIDEMENT N/A 01/11/2014   Procedure: Irrigation and Debridement Lumbar Wound  for hematoma;  Surgeon: Mariam Dollar, MD;  Location: [facility];  Service: Neurosurgery;  Laterality: N/A;   MAXIMUM ACCESS (MAS)POSTERIOR LUMBAR INTERBODY FUSION (PLIF) 1 LEVEL N/A 12/05/2013   Procedure: FOR MAXIMUM ACCESS (MAS) POSTERIOR LUMBAR INTERBODY FUSION (PLIF)LUMBAR FIVE-SACRAL-ONE,REMOVAL HARDWARE LUMBAR FOUR-FIVE;  Surgeon: Mariam Dollar, MD;  Location: [facility];  Service: Neurosurgery;  Laterality: N/A;   Mohr's procedure  02/2015   face   Right Hip arthroplasty     10/01/16 Dr. Lequita Halt   ROOT CANAL     SHOULDER SURGERY Left ~14 years ago   "scope"   thumb surgery Left ~14 years ago   x2   TOTAL HIP ARTHROPLASTY Left 04/25/2015   Procedure: LEFT TOTAL HIP ARTHROPLASTY ANTERIOR APPROACH;  Surgeon: Ollen Gross, MD;  Location: [facility] ORS;  Service: Orthopedics;  Laterality: Left;   TOTAL HIP ARTHROPLASTY Right 10/01/2016   Procedure: RIGHT TOTAL HIP ARTHROPLASTY ANTERIOR APPROACH;  Surgeon: Ollen Gross, MD;  Location: [facility] ORS;  Service: Orthopedics;  Laterality: Right;   TOTAL KNEE ARTHROPLASTY Right 07/25/2013   Procedure: RIGHT TOTAL KNEE ARTHROPLASTY;  Surgeon: Loanne Drilling, MD;  Location: [facility] ORS;  Service: Orthopedics;   Laterality: Right;   TOTAL KNEE ARTHROPLASTY WITH REVISION COMPONENTS Right 05/28/2016   Procedure: RIGHT TIBIAL POLYETHYLENE KNEE REVISION;  Surgeon: Ollen Gross, MD;  Location: [facility] ORS;  Service: Orthopedics;  Laterality: Right;  requests   TRIGGER FINGER RELEASE  ~6 years ago   X2 ON RIGHT HAND   TUBAL LIGATION      SOCIAL HISTORY: Social History   Socioeconomic History   Marital status: Widowed    Spouse name: Not on file   Number of children: Not on file   Years of education: Not on file   Highest education level: Not on file  Occupational History   Not on file  Tobacco Use   Smoking status: Never   Smokeless tobacco: Never  Vaping Use   Vaping status: Never Used  Substance and Sexual Activity   Alcohol use: No   Drug use: No   Sexual activity: Yes  Other Topics Concern   Not on file  Social History Narrative   Not on file   Social Determinants of Health   Financial Resource Strain: Not on file  Food Insecurity: No Food Insecurity (09/10/2022)   Received from [facility]   Hunger Vital Sign    Worried About Running Out of Food in the Last Year: Never true    Ran Out of Food in the Last Year: Never true  Transportation Needs: No Transportation Needs (09/10/2022)   Received from [facility] - Transportation    Lack of Transportation (Medical): No    Lack of Transportation (Non-Medical): No  Physical Activity: Not on file  Stress: No Stress Concern Present (09/10/2022)   Received from [facility] - Occupational Stress Questionnaire    Feeling of Stress : Not at all  Social Connections: Unknown (09/01/2022)   Received from [facility]   Social Network    Social Network: Not on file  Intimate Partner Violence: Not At Risk (09/10/2022)   Received from [facility]   HITS    Over the last 12 months how often did your partner physically hurt you?: 1    Over the last 12 months how often did your  partner insult you or talk down to you?: 1    Over the last 12  months how often did your partner threaten you with physical harm?: 1    Over the last 12 months how often did your partner scream or curse at you?: 1    FAMILY HISTORY: Family History  Problem Relation Age of Onset   Heart disease Sister    Lung cancer Brother    Heart disease Brother    CAD Brother    Heart attack Mother    Emphysema Father    Hypertension Daughter    Colon cancer Neg Hx     ALLERGIES:  is allergic to hydrocodone-acetaminophen, methotrexate derivatives, metoclopramide, pseudoephedrine, vicodin [hydrocodone-acetaminophen], molds & smuts, other, and oxycodone.  MEDICATIONS:  Current Outpatient Medications  Medication Sig Dispense Refill   acetaminophen (TYLENOL) 500 MG tablet Take 1,000 mg by mouth as needed for moderate pain.     alendronate (FOSAMAX) 70 MG tablet Take 70 mg by mouth once a week.     ALPRAZolam (XANAX) 1 MG tablet Take 1 mg by mouth at bedtime.     amLODipine (NORVASC) 10 MG tablet Take 10 mg by mouth daily.     atorvastatin (LIPITOR) 20 MG tablet Take 20 mg by mouth in the morning.     benzonatate (TESSALON) 100 MG capsule Take 100 mg by mouth 3 (three) times daily as needed for cough.     Calcium Carbonate-Vitamin D (CALCIUM-VITAMIN D3 PO) Take 1 tablet by mouth daily.     Capsaicin-Menthol (SALONPAS GEL-PATCH HOT) 0.025-1.25 % PTCH Apply 1 patch topically daily as needed (pain).     cetirizine (ZYRTEC) 10 MG tablet Take 10 mg by mouth in the morning.     cholecalciferol (VITAMIN D3) 25 MCG (1000 UNIT) tablet Take 2,000 Units by mouth daily.     diclofenac Sodium (VOLTAREN) 1 % GEL Apply 1 Application topically as needed (pain).     esomeprazole (NEXIUM) 20 MG capsule Take 20 mg by mouth daily before breakfast.      gabapentin (NEURONTIN) 300 MG capsule Take 600 mg by mouth 2 (two) times daily.     hydroxychloroquine (PLAQUENIL) 200 MG tablet Take 200 mg by mouth in the morning.      ibuprofen (ADVIL) 200 MG tablet Take 400 mg by mouth as needed for moderate pain.     leflunomide (ARAVA) 20 MG tablet Take 20 mg by mouth in the morning.     lisinopril (PRINIVIL,ZESTRIL) 40 MG tablet Take 40 mg by mouth in the morning.     Multiple Vitamin (MULTI VITAMIN) TABS 1 tablet Orally Once a day     Multiple Vitamins-Minerals (HAIR/SKIN/NAILS) CAPS Take 1 tablet by mouth daily.     Multiple Vitamins-Minerals (MULTIVITAMIN WITH MINERALS) tablet Take 1 tablet by mouth daily.     nortriptyline (PAMELOR) 25 MG capsule Take 25 mg by mouth at bedtime.   2   predniSONE (DELTASONE) 5 MG tablet Take 1 tablet (5 mg total) by mouth daily with breakfast.     PRESCRIPTION MEDICATION Place 1 drop into both eyes in the morning, at noon, in the evening, and at bedtime. Unknown eye drop     sucralfate (CARAFATE) 1 G tablet Take 1 g by mouth as needed (indigestion).     No current facility-administered medications for this visit.    REVIEW OF SYSTEMS:   Constitutional: ( - ) fevers, ( - )  chills , ( - ) night sweats Eyes: ( - ) blurriness of vision, ( - ) double vision, ( - ) watery eyes Ears, nose, mouth,  throat, and face: ( - ) mucositis, ( - ) sore throat Respiratory: ( - ) cough, ( - ) dyspnea, ( - ) wheezes Cardiovascular: ( - ) palpitation, ( - ) chest discomfort, ( - ) lower extremity swelling Gastrointestinal:  ( - ) nausea, ( - ) heartburn, ( - ) change in bowel habits Skin: ( - ) abnormal skin rashes Lymphatics: ( - ) new lymphadenopathy, ( - ) easy bruising Neurological: ( - ) numbness, ( - ) tingling, ( - ) new weaknesses Behavioral/Psych: ( - ) mood change, ( - ) new changes  All other systems were reviewed with the patient and are negative.  PHYSICAL EXAMINATION:  Vitals:   10/21/22 1408  BP: (!) 153/80  Pulse: 100  Resp: 17  Temp: 97.6 F (36.4 C)  SpO2: 97%    Filed Weights   10/21/22 1408  Weight: 195 lb 4.8 oz (88.6 kg)     GENERAL: Well-appearing elderly  Caucasian female, alert, no distress and comfortable SKIN: skin color, texture, turgor are normal, no rashes or significant lesions EYES: conjunctiva are pink and non-injected, sclera clear LUNGS: clear to auscultation and percussion with normal breathing effort HEART: regular rate & rhythm and no murmurs and no lower extremity edema Musculoskeletal: no cyanosis of digits and no clubbing  PSYCH: alert & oriented x 3, fluent speech NEURO: no focal motor/sensory deficits  LABORATORY DATA:  I have reviewed the data as listed    Latest Ref Rng & Units 10/14/2022    2:17 PM 06/23/2022   12:00 PM 04/22/2022    9:45 AM  CBC  WBC 4.0 - 10.5 K/uL 5.4  7.8  8.6   Hemoglobin 12.0 - 15.0 g/dL 40.1  02.7  25.3   Hematocrit 36.0 - 46.0 % 34.9  41.2  37.6   Platelets 150 - 400 K/uL 404  333  329        Latest Ref Rng & Units 10/14/2022    2:17 PM 06/24/2022    5:51 AM 06/23/2022   12:00 PM  CMP  Glucose 70 - 99 mg/dL 664  403  94   BUN 8 - 23 mg/dL 11  15  19    Creatinine 0.44 - 1.00 mg/dL 4.74  2.59  5.63   Sodium 135 - 145 mmol/L 141  140  143   Potassium 3.5 - 5.1 mmol/L 3.5  3.7  3.0   Chloride 98 - 111 mmol/L 106  105  103   CO2 22 - 32 mmol/L 27  26  28    Calcium 8.9 - 10.3 mg/dL 9.7  8.7  9.3   Total Protein 6.5 - 8.1 g/dL 6.2  6.3  7.3   Total Bilirubin 0.3 - 1.2 mg/dL 0.3  0.4  0.5   Alkaline Phos 38 - 126 U/L 105  59  70   AST 15 - 41 U/L 39  20  29   ALT 0 - 44 U/L 30  29  38     Lab Results  Component Value Date   MPROTEIN Not Observed 08/21/2021   Lab Results  Component Value Date   KPAFRELGTCHN 11.3 08/21/2021   LAMBDASER 7.4 08/21/2021   KAPLAMBRATIO 1.53 08/21/2021    RADIOGRAPHIC STUDIES: No results found.  ASSESSMENT & PLAN WHITNI MCWHIRT 72 y.o. female with medical history significant for iron deficiency anemia secondary to GI bleeding who presents for a follow up visit.  After review of the labs, review of the records, and discussion  with the patient the  patients findings are most consistent with iron deficiency anemia likely secondary to GI bleeding.   # Iron Deficiency Anemia 2/2 to GI Bleeding -- Findings are consistent with iron deficiency anemia secondary to likely GI bleeding --patient underwent EGD on 09/24/2021 and colonoscopy on 10/23/2020. No clear source of bleeding identified, however inflammation was noted in the stomach.  -- Today we ordered iron panel and ferritin as well as reticulocytes, CBC, and CMP -- Unable to tolerate p.o. iron therapy due to severe constipation. -- Patient underwent 5 doses of IV iron sucrose 200 mg from 08/26/2021 to 09/23/2021 --Labs today show white blood cell count 5.4, hemoglobin 9.4, MCV 81.9, and platelets of 404 with iron saturation of 12% and ferritin of 44 --Will plan to proceed with IV iron.  Will request Monoferric, however she has received Venofer successfully in the past. --RTC 4 to 6 weeks after last dose of IV iron    No orders of the defined types were placed in this encounter.   All questions were answered. The patient knows to call the clinic with any problems, questions or concerns.  A total of more than 30 minutes were spent on this encounter with face-to-face time and non-face-to-face time, including preparing to see the patient, ordering tests and/or medications, counseling the patient and coordination of care as outlined above.   Ulysees Barns, MD Department of Hematology/Oncology [facility] Phone: [phone] Pager: [phone] Email: [email or web address]  10/21/2022 2:29 PM

## 2022-10-23 ENCOUNTER — Telehealth: Payer: Self-pay | Admitting: Pharmacy Technician

## 2022-10-23 ENCOUNTER — Other Ambulatory Visit: Payer: Self-pay

## 2022-10-23 NOTE — Telephone Encounter (Signed)
Dr. Leonides Schanz, Monoferric is non preferred and will be denied if patient has not failed feraheme or venofer.  Would you like to try Feraheme?  Auth Submission: NO AUTH NEEDED Site of care: Site of care: [facility] Payer: [employer] MEDICARE Medication & CPT/J Code(s) submitted: Feraheme (ferumoxytol) [id number] Route of submission (phone, fax, portal): PORTAL Phone # Fax # Auth type: Buy/Bill PB Units/visits requested: X2 Reference number: [id number] Approval from: 10/23/22 to 01/23/23

## 2022-10-27 ENCOUNTER — Ambulatory Visit: Payer: Medicare Other | Admitting: Hematology and Oncology

## 2022-11-05 ENCOUNTER — Ambulatory Visit (INDEPENDENT_AMBULATORY_CARE_PROVIDER_SITE_OTHER): Payer: Medicare Other

## 2022-11-05 VITALS — BP 154/85 | HR 83 | Temp 98.6°F | Resp 16 | Ht 64.0 in | Wt 194.6 lb

## 2022-11-05 DIAGNOSIS — K922 Gastrointestinal hemorrhage, unspecified: Secondary | ICD-10-CM

## 2022-11-05 DIAGNOSIS — D500 Iron deficiency anemia secondary to blood loss (chronic): Secondary | ICD-10-CM

## 2022-11-05 MED ORDER — DIPHENHYDRAMINE HCL 25 MG PO CAPS
25.0000 mg | ORAL_CAPSULE | Freq: Once | ORAL | Status: AC
  Administered 2022-11-05: 25 mg via ORAL
  Filled 2022-11-05: qty 1

## 2022-11-05 MED ORDER — ACETAMINOPHEN 325 MG PO TABS
650.0000 mg | ORAL_TABLET | Freq: Once | ORAL | Status: AC
  Administered 2022-11-05: 650 mg via ORAL
  Filled 2022-11-05: qty 2

## 2022-11-05 MED ORDER — SODIUM CHLORIDE 0.9 % IV SOLN
510.0000 mg | Freq: Once | INTRAVENOUS | Status: AC
  Administered 2022-11-05: 510 mg via INTRAVENOUS
  Filled 2022-11-05: qty 17

## 2022-11-05 NOTE — Progress Notes (Signed)
Diagnosis: Iron Deficiency Anemia  Provider:  Chilton Greathouse MD  Procedure: IV Infusion  IV Type: Peripheral, IV Location: R Antecubital  Feraheme (Ferumoxytol), Dose: 510 mg  Infusion Start Time: 1426  Infusion Stop Time: 1444  Post Infusion IV Care: Observation period completed and Peripheral IV Discontinued  Discharge: Condition: Good, Destination: Home . AVS Declined  Performed by:  Wyvonne Lenz, RN

## 2022-11-12 ENCOUNTER — Ambulatory Visit (INDEPENDENT_AMBULATORY_CARE_PROVIDER_SITE_OTHER): Payer: Medicare Other

## 2022-11-12 VITALS — BP 155/90 | HR 80 | Temp 98.5°F | Resp 18 | Ht 64.0 in | Wt 191.4 lb

## 2022-11-12 DIAGNOSIS — K922 Gastrointestinal hemorrhage, unspecified: Secondary | ICD-10-CM | POA: Diagnosis not present

## 2022-11-12 DIAGNOSIS — D500 Iron deficiency anemia secondary to blood loss (chronic): Secondary | ICD-10-CM

## 2022-11-12 MED ORDER — SODIUM CHLORIDE 0.9 % IV SOLN
510.0000 mg | Freq: Once | INTRAVENOUS | Status: AC
  Administered 2022-11-12: 510 mg via INTRAVENOUS
  Filled 2022-11-12: qty 17

## 2022-11-12 MED ORDER — ACETAMINOPHEN 325 MG PO TABS
650.0000 mg | ORAL_TABLET | Freq: Once | ORAL | Status: AC
  Administered 2022-11-12: 650 mg via ORAL
  Filled 2022-11-12: qty 2

## 2022-11-12 MED ORDER — DIPHENHYDRAMINE HCL 25 MG PO CAPS
25.0000 mg | ORAL_CAPSULE | Freq: Once | ORAL | Status: AC
  Administered 2022-11-12: 25 mg via ORAL
  Filled 2022-11-12: qty 1

## 2022-11-12 NOTE — Progress Notes (Signed)
Diagnosis: Iron Deficiency Anemia  Provider:  Chilton Greathouse MD  Procedure: IV Infusion  IV Type: Peripheral, IV Location: R Antecubital  Feraheme (Ferumoxytol), Dose: 510 mg  Infusion Start Time: 1431  Infusion Stop Time: 1450  Post Infusion IV Care: Patient declined observation and Peripheral IV Discontinued  Discharge: Condition: Good, Destination: Home . AVS Declined  Performed by:  Garnette Czech, RN

## 2022-11-28 ENCOUNTER — Ambulatory Visit
Admission: EM | Admit: 2022-11-28 | Discharge: 2022-11-28 | Disposition: A | Discharge status: Home / Self Care | Payer: Medicare Other | Attending: Internal Medicine | Admitting: Internal Medicine

## 2022-11-28 DIAGNOSIS — U071 COVID-19: Secondary | ICD-10-CM

## 2022-11-28 DIAGNOSIS — J309 Allergic rhinitis, unspecified: Secondary | ICD-10-CM | POA: Diagnosis not present

## 2022-11-28 DIAGNOSIS — R0602 Shortness of breath: Secondary | ICD-10-CM

## 2022-11-28 MED ORDER — PREDNISONE 20 MG PO TABS
ORAL_TABLET | ORAL | 0 refills | Status: AC
Start: 2022-11-28 — End: ?

## 2022-11-28 MED ORDER — PROMETHAZINE-DM 6.25-15 MG/5ML PO SYRP: 5.0000 mL | ORAL_SOLUTION | Freq: Three times a day (TID) | ORAL | 0 refills | Status: AC | PRN

## 2022-11-28 MED ORDER — PAXLOVID (300/100) 20 X 150 MG & 10 X 100MG PO TBPK
ORAL_TABLET | ORAL | 0 refills | Status: AC
Start: 2022-11-28 — End: ?

## 2022-11-28 NOTE — Discharge Instructions (Addendum)
Start Paxlovid for your COVID-19 infection.  Take 1/2 the dose of Xanax and stop the atorvastatin cholesterol medication while on Paxlovid.  Otherwise, for sore throat or cough try using a honey-based tea. Use 3 teaspoons of honey with juice squeezed from half lemon. Place shaved pieces of ginger into 1/2-1 cup of water and warm over stove top. Then mix the ingredients and repeat every 4 hours as needed. Please take Tylenol 650mg  once every 6 hours for fevers, aches and pains. Hydrate very well with at least 2 liters (64 ounces) of water. Eat light meals such as soups (chicken and noodles, chicken wild rice, vegetable).  Do not eat any foods that you are allergic to.  Keep taking Zyrtec.  You can take this together with prednisone (for 5 days). Use cough medication as needed.

## 2022-11-28 NOTE — ED Triage Notes (Signed)
Pt c/o cough, head/chest congestion started yesterday am-reports +covid home test-DOE-steady gait with own rollator

## 2022-11-28 NOTE — ED Provider Notes (Signed)
[facility]  Note:  This document was prepared using Conservation officer, historic buildings and may include unintentional dictation errors.  MRN: [id number] DOB: 06/30/1950  Subjective:   Angie Olson is a 72 y.o. female presenting for 1 day history of severe malaise, coughing fits, sinus congestion, chest congestion, malaise and body pains.  Patient did a COVID test at home and was positive.  Has a history of bronchitis, pneumonia, allergies.  No history of asthma that she knows of.  Patient has previously done well with prednisone.  No smoking of any kind including cigarettes, cigars, vaping, marijuana use.    No current facility-administered medications for this encounter.  Current Outpatient Medications:    acetaminophen (TYLENOL) 500 MG tablet, Take 1,000 mg by mouth as needed for moderate pain., Disp: , Rfl:    alendronate (FOSAMAX) 70 MG tablet, Take 70 mg by mouth once a week., Disp: , Rfl:    ALPRAZolam (XANAX) 1 MG tablet, Take 1 mg by mouth at bedtime., Disp: , Rfl:    amLODipine (NORVASC) 10 MG tablet, Take 10 mg by mouth daily., Disp: , Rfl:    atorvastatin (LIPITOR) 20 MG tablet, Take 20 mg by mouth in the morning., Disp: , Rfl:    benzonatate (TESSALON) 100 MG capsule, Take 100 mg by mouth 3 (three) times daily as needed for cough., Disp: , Rfl:    Calcium Carbonate-Vitamin D (CALCIUM-VITAMIN D3 PO), Take 1 tablet by mouth daily., Disp: , Rfl:    Capsaicin-Menthol (SALONPAS GEL-PATCH HOT) 0.025-1.25 % PTCH, Apply 1 patch topically daily as needed (pain)., Disp: , Rfl:    cetirizine (ZYRTEC) 10 MG tablet, Take 10 mg by mouth in the morning., Disp: , Rfl:    cholecalciferol (VITAMIN D3) 25 MCG (1000 UNIT) tablet, Take 2,000 Units by mouth daily., Disp: , Rfl:    diclofenac Sodium (VOLTAREN) 1 % GEL, Apply 1 Application topically as needed (pain)., Disp: , Rfl:    esomeprazole (NEXIUM) 20 MG capsule, Take 20 mg by mouth daily before breakfast. , Disp: ,  Rfl:    gabapentin (NEURONTIN) 300 MG capsule, Take 600 mg by mouth 2 (two) times daily., Disp: , Rfl:    hydroxychloroquine (PLAQUENIL) 200 MG tablet, Take 200 mg by mouth in the morning., Disp: , Rfl:    ibuprofen (ADVIL) 200 MG tablet, Take 400 mg by mouth as needed for moderate pain., Disp: , Rfl:    leflunomide (ARAVA) 20 MG tablet, Take 20 mg by mouth in the morning., Disp: , Rfl:    lisinopril (PRINIVIL,ZESTRIL) 40 MG tablet, Take 40 mg by mouth in the morning., Disp: , Rfl:    Multiple Vitamin (MULTI VITAMIN) TABS, 1 tablet Orally Once a day, Disp: , Rfl:    Multiple Vitamins-Minerals (HAIR/SKIN/NAILS) CAPS, Take 1 tablet by mouth daily., Disp: , Rfl:    Multiple Vitamins-Minerals (MULTIVITAMIN WITH MINERALS) tablet, Take 1 tablet by mouth daily., Disp: , Rfl:    nortriptyline (PAMELOR) 25 MG capsule, Take 25 mg by mouth at bedtime. , Disp: , Rfl: 2   predniSONE (DELTASONE) 5 MG tablet, Take 1 tablet (5 mg total) by mouth daily with breakfast., Disp: , Rfl:    PRESCRIPTION MEDICATION, Place 1 drop into both eyes in the morning, at noon, in the evening, and at bedtime. Unknown eye drop, Disp: , Rfl:    sucralfate (CARAFATE) 1 G tablet, Take 1 g by mouth as needed (indigestion)., Disp: , Rfl:    Allergies  Allergen  Reactions   Hydrocodone-Acetaminophen Other (See Comments)        "Makes me feel weird"   Methotrexate Derivatives Other (See Comments)    Causes recurrent infections    Metoclopramide Other (See Comments)    insomnia     Pseudoephedrine Other (See Comments)    Makes jittery     Vicodin [Hydrocodone-Acetaminophen] Other (See Comments)    "Makes me feel weird"   Molds & Smuts Other (See Comments)    Congestion    Other Other (See Comments)    Pollen / Closes Sinus    Oxycodone Itching and Rash    Patient tolerated hydromorphone without reaction.    Past Medical History:  Diagnosis Date   Acid reflux    AKI (acute kidney injury) (HCC) 07/18/2014   Allergic  rhinitis    anesthetic complication    Respiratory arrest Feb 09, 2019  Anxiety    PT'S SON DIED 02/09/11, husband died in 2021-03-25Mother died in 2019-09-09  Arthritis    RA AND OA   Borderline hypercholesterolemia    on medication for TIA   Bronchitis    CHRONIC    Cancer (HCC)    melanoma - face    Carpal tunnel syndrome 04/01/2022   Closed fracture of single ramus of right pubis (HCC) 04/01/2022   Colitis 07/19/2014   Colon polyp    COVID-19 virus infection 02/17/2019   February 09, 2019  Environmental allergies    Epidural hematoma (HCC) 01/11/2014   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   GERD 12/07/2006   Qualifier: Diagnosis of   By: Claiborne Billings CMA, Jacqualynn       GERD (gastroesophageal reflux disease)    on PPI and carafte prn   H/O hiatal hernia    History of COVID-19 02-09-19   History of kidney stones    Hypertension    IBS (irritable bowel syndrome)    Iron deficiency anemia 08/06/2021   Iron deficiency anemia due to chronic blood loss 08/21/2021   Irritable bowel syndrome 01/03/2020   LBP (low back pain)    Nosocomial pneumonia 02/07/2019   OA (osteoarthritis)    Left Shoulder   Pain    RIGHT KNEE - TORN MENISCUS AND ACL   Pneumonia    X 3  - LAST TIME WAS 02-09-2012   PONV (postoperative nausea and vomiting) 09/2012   sore throat after ankle surgery 6 yrs ago and severe ponv 09-2012   Pre-diabetes    patient denies - states it was "years ago and no follow up"   Pulmonary nodule seen on imaging study    Shortness of breath    with activity,bronchitis   Stroke (HCC) 03/2011   TIA--PT EXPERIENCED NUMBNESS RT HAND AND ARM , HEADACHE AND NAUSEA. NO RESIDUAL PROBLEMS   Thrombocytosis 02/06/2019     Past Surgical History:  Procedure Laterality Date   ANKLE SURGERY  ~10 years ago   left   APPLICATION OF INTRAOPERATIVE CT SCAN N/A 03/27/2021   Procedure: APPLICATION OF INTRAOPERATIVE CT SCAN;  Surgeon: Donalee Citrin, MD;  Location[facility] OR;  Service:  Neurosurgery;  Laterality: N/A;   BACK SURGERY  02/08/2010   lower   CARPAL TUNNEL RELEASE Right 02/09/2020   CHOLECYSTECTOMY  ~15 years ago   COLONOSCOPY  2004/02/09, Feb 09, 2012   COLONOSCOPY W/ POLYPECTOMY  10/23/2020   Small adenoma   HARDWARE REMOVAL N/A 04/07/2016   Procedure: HARDWARE REMOVAL;  Surgeon: Donalee Citrin, MD;  Location: [facility] OR;  Service: Neurosurgery;  Laterality: N/A;   HARDWARE REMOVAL N/A 08/01/2020   Procedure: Removal of hardware with cutting the rod below the Lumbar one screws and removal of bilateral Lumbar four screws;  Surgeon: Donalee Citrin, MD;  Location: [facility] OR;  Service: Neurosurgery;  Laterality: N/A;   HARVEST BONE GRAFT N/A 07/05/2014   Procedure: HARVEST ILIAC BONE GRAFT;  Surgeon: Donalee Citrin, MD;  Location: [facility] NEURO ORS;  Service: Neurosurgery;  Laterality: N/A;   KNEE ARTHROSCOPY Right 10/13/2012   Procedure: RIGHT KNEE ARTHROSCOPY WITH DEBRIDEMENT, CHONDROPLASTY;  Surgeon: Loanne Drilling, MD;  Location: [facility] ORS;  Service: Orthopedics;  Laterality: Right;   KNEE ARTHROSCOPY Right 03/02/2013   Procedure: RIGHT ARTHROSCOPY KNEE WITH MEDIAL MENISCAL  DEBRIDEMENT;  Surgeon: Loanne Drilling, MD;  Location: [facility] ORS;  Service: Orthopedics;  Laterality: Right;   LAMINECTOMY WITH POSTERIOR LATERAL ARTHRODESIS LEVEL 1 N/A 08/01/2020   Procedure: posterolateral and Situ fusion;  Surgeon: Donalee Citrin, MD;  Location: [facility] OR;  Service: Neurosurgery;  Laterality: N/A;   LAMINECTOMY WITH POSTERIOR LATERAL ARTHRODESIS LEVEL 1 N/A 04/01/2021   Procedure: REEXPLORATION OF LUMBAR FUSION FOR REMOVAL AND REPLACEMENT OF MALPOSITIONED RIGHT SACRAL ONE PEDICLE SCREW;  Surgeon: Donalee Citrin, MD;  Location: [facility] OR;  Service: Neurosurgery;  Laterality: N/A;   LAMINECTOMY WITH POSTERIOR LATERAL ARTHRODESIS LEVEL 4 N/A 01/23/2020   Procedure: Posterior lateral fusion - Thoracic Ten -Thoracic Eleven - Thoracic Eleven -Thoracic Twelve - Thoracic Twelve- Lumbar One - Lumbar One- Lumbar Two revision with removal globus;   Surgeon: Donalee Citrin, MD;  Location: [facility] OR;  Service: Neurosurgery;  Laterality: N/A;  Eleven - Thoracic Eleven -Thoracic Twelve - Thoracic Twelve- Lumbar One - Lumbar One- Lumbar Two revision with removal globus   LAMINECTOMY WITH POSTERIOR LATERAL ARTHRODESIS LEVEL 4 N/A 03/27/2021   Procedure: Posterior lateral fusion - Lumbar two-Lumbar three - Lumbar three-Lumbar four - Lumbar four-Lumbar five - Lumbar five-Sacral one - Pelvis with navigation Airo;  Surgeon: Donalee Citrin, MD;  Location: [facility] OR;  Service: Neurosurgery;  Laterality: N/A;   LUMBAR FUSION  07/05/2014   LUMBAR WOUND DEBRIDEMENT N/A 01/11/2014   Procedure: Irrigation and Debridement Lumbar Wound for hematoma;  Surgeon: Mariam Dollar, MD;  Location: [facility] NEUR[facility] ORS;  Service: Neurosurgery;  Laterality: N/A;   MAXIMUM ACCESS (MAS)POSTERIOR LUMBAR INTERBODY FUSION (PLIF) 1 LEVEL N/A 12/05/2013   Procedure: FOR MAXIMUM ACCESS (MAS) POSTERIOR LUMBAR INTERBODY FUSION (PLIF)LUMBAR FIVE-SACRAL-ONE,REMOVAL HARDWARE LUMBAR FOUR-FIVE;  Surgeon: Mariam Dollar, MD;  Location: [facility] NEUR[facility] ORS;  Service: Neurosurgery;  Laterality: N/A;   Mohr's procedure  02/2015   face   Right Hip arthroplasty     10/01/16 Dr. Lequita Halt   ROOT CANAL     SHOULDER SURGERY Left ~14 years ago   "scope"   thumb surgery Left ~14 years ago   x2   TOTAL HIP ARTHROPLASTY Left 04/25/2015   Procedure: LEFT TOTAL HIP ARTHROPLASTY ANTERIOR APPROACH;  Surgeon: Ollen Gross, MD;  Location: [facility] ORS;  Service: Orthopedics;  Laterality: Left;   TOTAL HIP ARTHROPLASTY Right 10/01/2016   Procedure: RIGHT TOTAL HIP ARTHROPLASTY ANTERIOR APPROACH;  Surgeon: Ollen Gross, MD;  Location: [facility] ORS;  Service: Orthopedics;  Laterality: Right;   TOTAL KNEE ARTHROPLASTY Right 07/25/2013   Procedure: RIGHT TOTAL KNEE ARTHROPLASTY;  Surgeon: Loanne Drilling, MD;  Location: [facility] ORS;  Service: Orthopedics;  Laterality: Right;   TOTAL KNEE ARTHROPLASTY WITH REVISION COMPONENTS Right 05/28/2016   Procedure:  RIGHT TIBIAL POLYETHYLENE KNEE REVISION;  Surgeon: Ollen Gross,  MD;  Location: [facility] ORS;  Service: Orthopedics;  Laterality: Right;  requests   TRIGGER FINGER RELEASE  ~6 years ago   X2 ON RIGHT HAND   TUBAL LIGATION      Family History  Problem Relation Age of Onset   Heart disease Sister    Lung cancer Brother    Heart disease Brother    CAD Brother    Heart attack Mother    Emphysema Father    Hypertension Daughter    Colon cancer Neg Hx     Social History   Tobacco Use   Smoking status: Never   Smokeless tobacco: Never  Vaping Use   Vaping status: Never Used  Substance Use Topics   Alcohol use: No   Drug use: No    ROS   Objective:   Vitals: BP (!) 177/97 (BP Location: Right Arm)   Pulse (!) 105   Temp 98.4 F (36.9 C) (Oral)   Resp (!) 24   SpO2 95%   Physical Exam Constitutional:      General: She is not in acute distress.    Appearance: Normal appearance. She is well-developed and normal weight. She is not ill-appearing, toxic-appearing or diaphoretic.  HENT:     Head: Normocephalic and atraumatic.     Right Ear: Tympanic membrane, ear canal and external ear normal. No drainage or tenderness. No middle ear effusion. There is no impacted cerumen. Tympanic membrane is not erythematous or bulging.     Left Ear: Tympanic membrane, ear canal and external ear normal. No drainage or tenderness.  No middle ear effusion. There is no impacted cerumen. Tympanic membrane is not erythematous or bulging.     Nose: Congestion present. No rhinorrhea.     Mouth/Throat:     Mouth: Mucous membranes are moist. No oral lesions.     Pharynx: No pharyngeal swelling, oropharyngeal exudate, posterior oropharyngeal erythema or uvula swelling.     Tonsils: No tonsillar exudate or tonsillar abscesses.  Eyes:     General: No scleral icterus.       Right eye: No discharge.        Left eye: No discharge.     Extraocular Movements: Extraocular movements intact.     Right  eye: Normal extraocular motion.     Left eye: Normal extraocular motion.     Conjunctiva/sclera: Conjunctivae normal.  Cardiovascular:     Rate and Rhythm: Normal rate and regular rhythm.     Heart sounds: Normal heart sounds. No murmur heard.    No friction rub. No gallop.  Pulmonary:     Effort: No respiratory distress.     Breath sounds: No stridor. No wheezing, rhonchi or rales.     Comments: Persistent coughing during visit. Chest:     Chest wall: No tenderness.  Musculoskeletal:     Cervical back: Normal range of motion and neck supple.  Lymphadenopathy:     Cervical: No cervical adenopathy.  Skin:    General: Skin is warm and dry.  Neurological:     General: No focal deficit present.     Mental Status: She is alert and oriented to person, place, and time.  Psychiatric:        Mood and Affect: Mood normal.        Behavior: Behavior normal.    Recent Results (from the past 2160 hour(s))  CBC with Differential (Cancer Center Only)     Status: Abnormal   Collection Time: 10/14/22  2:17 PM  Result Value Ref Range   WBC Count 5.4 4.0 - 10.5 K/uL   RBC 4.26 3.87 - 5.11 MIL/uL   Hemoglobin 11.4 (L) 12.0 - 15.0 g/dL   HCT 21.3 (L) 08.6 - 57.8 %   MCV 81.9 80.0 - 100.0 fL   MCH 26.8 26.0 - 34.0 pg   MCHC 32.7 30.0 - 36.0 g/dL   RDW 46.9 62.9 - 52.8 %   Platelet Count 404 (H) 150 - 400 K/uL   nRBC 0.0 0.0 - 0.2 %   Neutrophils Relative % 56 %   Neutro Abs 3.0 1.7 - 7.7 K/uL   Lymphocytes Relative 28 %   Lymphs Abs 1.5 0.7 - 4.0 K/uL   Monocytes Relative 11 %   Monocytes Absolute 0.6 0.1 - 1.0 K/uL   Eosinophils Relative 4 %   Eosinophils Absolute 0.2 0.0 - 0.5 K/uL   Basophils Relative 1 %   Basophils Absolute 0.0 0.0 - 0.1 K/uL   Immature Granulocytes 0 %   Abs Immature Granulocytes 0.00 0.00 - 0.07 K/uL    Comment: Performed at [facility], [address] [place], [place] [address]  CMP (Cancer Center only)     Status: Abnormal    Collection Time: 10/14/22  2:17 PM  Result Value Ref Range   Sodium 141 135 - 145 mmol/L   Potassium 3.5 3.5 - 5.1 mmol/L   Chloride 106 98 - 111 mmol/L   CO2 27 22 - 32 mmol/L   Glucose, Bld 144 (H) 70 - 99 mg/dL    Comment: Glucose reference range applies only to samples taken after fasting for at least 8 hours.   BUN 11 8 - 23 mg/dL   Creatinine 4.01 0.27 - 1.00 mg/dL   Calcium 9.7 8.9 - 25.3 mg/dL   Total Protein 6.2 (L) 6.5 - 8.1 g/dL   Albumin 3.9 3.5 - 5.0 g/dL   AST 39 15 - 41 U/L   ALT 30 0 - 44 U/L   Alkaline Phosphatase 105 38 - 126 U/L   Total Bilirubin 0.3 0.3 - 1.2 mg/dL   GFR, Estimated >66 >44 mL/min    Comment: (NOTE) Calculated using the CKD-EPI Creatinine Equation (2021)    Anion gap 8 5 - 15    Comment: Performed at [facility], [address] [place], [place] [address]  Iron and Iron Binding Capacity ([facility] only)     Status: None   Collection Time: 10/14/22  2:17 PM  Result Value Ref Range   Iron 45 28 - 170 ug/dL   TIBC 259 563 - 875 ug/dL   Saturation Ratios 12 10.4 - 31.8 %   UIBC 343 148 - 442 ug/dL    Comment: Performed at [facility], [address] [place], [place] [address]  Ferritin     Status: None   Collection Time: 10/14/22  2:18 PM  Result Value Ref Range   Ferritin 44 11 - 307 ng/mL    Comment: Performed at [facility], [address], [place], [place] [address]  Retic Panel     Status: None   Collection Time: 10/14/22  2:18 PM  Result Value Ref Range   Retic Ct Pct 1.4 0.4 - 3.1 %   RBC. 4.20 3.87 - 5.11 MIL/uL   Retic Count, Absolute 57.1 19.0 - 186.0 K/uL   Immature Retic Fract 13.3 2.3 - 15.9 %   Reticulocyte Hemoglobin 28.0 >27.9 pg  Comment:           If this patient has chronic kidney disease and does not have a hemoglobinopathy a RET-He < 29 pg meets criteria for iron deficiency per the 2016 NICE guidelines.    Refer to specific guidelines to  determine the appropriate thresholds for treating CKD- associated iron deficiency. TSAT and ferritin should be used in patients with hemoglobinopathies (e.g. thalassemia). Performed at [facility], [address] [place], [place] [address]     Assessment and Plan :   PDMP not reviewed this encounter.  1. Clinical diagnosis of COVID-19   2. Allergic rhinitis, unspecified seasonality, unspecified trigger   3. Shortness of breath    Patient declined a confirmation COVID test.  Recommend starting Paxlovid.  Drug interactions reviewed, recommended taking half the dose of Xanax, holding her atorvastatin.  Use supportive care otherwise.  Deferred imaging given clear cardiopulmonary exam, hemodynamically stable vital signs.  Counseled patient on potential for adverse effects with medications prescribed/recommended today, ER and return-to-clinic precautions discussed, patient verbalized understanding.    Wallis Bamberg, PA-C 11/28/22 1351

## 2023-01-13 ENCOUNTER — Other Ambulatory Visit: Payer: Self-pay | Admitting: Hematology and Oncology

## 2023-01-13 DIAGNOSIS — D500 Iron deficiency anemia secondary to blood loss (chronic): Secondary | ICD-10-CM

## 2023-01-14 ENCOUNTER — Inpatient Hospital Stay: Payer: Medicare Other | Attending: Hematology and Oncology

## 2023-01-14 DIAGNOSIS — D500 Iron deficiency anemia secondary to blood loss (chronic): Secondary | ICD-10-CM | POA: Insufficient documentation

## 2023-01-14 LAB — RETIC PANEL
Immature Retic Fract: 3.8 % (ref 2.3–15.9)
RBC.: 4.97 MIL/uL (ref 3.87–5.11)
Retic Count, Absolute: 53.7 10*3/uL (ref 19.0–186.0)
Retic Ct Pct: 1.1 % (ref 0.4–3.1)
Reticulocyte Hemoglobin: 34.4 pg (ref >27.9)

## 2023-01-14 LAB — CMP (CANCER CENTER ONLY)
ALT: 13 U/L (ref 0–44)
AST: 16 U/L (ref 15–41)
Albumin: 4.5 g/dL (ref 3.5–5.0)
Alkaline Phosphatase: 69 U/L (ref 38–126)
Anion gap: 7 (ref 5–15)
BUN: 16 mg/dL (ref 8–23)
CO2: 28 mmol/L (ref 22–32)
Calcium: 9.9 mg/dL (ref 8.9–10.3)
Chloride: 106 mmol/L (ref 98–111)
Creatinine: 0.84 mg/dL (ref 0.44–1.00)
GFR, Estimated: >60 mL/min (ref >60)
Glucose, Bld: 119 mg/dL — ABNORMAL HIGH (ref 70–99)
Potassium: 4 mmol/L (ref 3.5–5.1)
Sodium: 141 mmol/L (ref 135–145)
Total Bilirubin: 0.4 mg/dL (ref 0.3–1.2)
Total Protein: 6.9 g/dL (ref 6.5–8.1)

## 2023-01-14 LAB — CBC WITH DIFFERENTIAL (CANCER CENTER ONLY)
Abs Immature Granulocytes: 0.01 10*3/uL (ref 0.00–0.07)
Basophils Absolute: 0 10*3/uL (ref 0.0–0.1)
Basophils Relative: 1 %
Eosinophils Absolute: 0.1 10*3/uL (ref 0.0–0.5)
Eosinophils Relative: 1 %
HCT: 42.3 % (ref 36.0–46.0)
Hemoglobin: 14.1 g/dL (ref 12.0–15.0)
Immature Granulocytes: 0 %
Lymphocytes Relative: 17 %
Lymphs Abs: 1.1 10*3/uL (ref 0.7–4.0)
MCH: 28.1 pg (ref 26.0–34.0)
MCHC: 33.3 g/dL (ref 30.0–36.0)
MCV: 84.4 fL (ref 80.0–100.0)
Monocytes Absolute: 0.4 10*3/uL (ref 0.1–1.0)
Monocytes Relative: 6 %
Neutro Abs: 4.7 10*3/uL (ref 1.7–7.7)
Neutrophils Relative %: 75 %
Platelet Count: 320 10*3/uL (ref 150–400)
RBC: 5.01 MIL/uL (ref 3.87–5.11)
RDW: 17.4 % — ABNORMAL HIGH (ref 11.5–15.5)
WBC Count: 6.2 10*3/uL (ref 4.0–10.5)
nRBC: 0 % (ref 0.0–0.2)

## 2023-01-14 LAB — FERRITIN: Ferritin: 302 ng/mL (ref 11–307)

## 2023-01-14 LAB — IRON AND IRON BINDING CAPACITY (CC-WL,HP ONLY)
Iron: 71 ug/dL (ref 28–170)
Saturation Ratios: 24 % (ref 10.4–31.8)
TIBC: 295 ug/dL (ref 250–450)
UIBC: 224 ug/dL (ref 148–442)

## 2023-01-20 ENCOUNTER — Telehealth: Payer: Self-pay | Admitting: *Deleted

## 2023-01-20 NOTE — Telephone Encounter (Signed)
Patient called office stating she was not feeling well and asked to r/s her appt with Dr. Leonides Schanz currently on 11/. Schedule message sent with request.  Patient informed.

## 2023-01-21 ENCOUNTER — Telehealth: Payer: Self-pay | Admitting: Hematology and Oncology

## 2023-01-21 ENCOUNTER — Inpatient Hospital Stay: Payer: Medicare Other | Admitting: Hematology and Oncology

## 2023-01-27 ENCOUNTER — Inpatient Hospital Stay: Payer: Medicare Other | Attending: Hematology and Oncology | Admitting: Hematology and Oncology

## 2023-01-27 VITALS — BP 159/80 | HR 84 | Temp 98.4°F | Resp 16 | Ht 64.0 in | Wt 185.4 lb

## 2023-01-27 DIAGNOSIS — Z801 Family history of malignant neoplasm of trachea, bronchus and lung: Secondary | ICD-10-CM | POA: Diagnosis not present

## 2023-01-27 DIAGNOSIS — K922 Gastrointestinal hemorrhage, unspecified: Secondary | ICD-10-CM | POA: Diagnosis not present

## 2023-01-27 DIAGNOSIS — D509 Iron deficiency anemia, unspecified: Secondary | ICD-10-CM | POA: Diagnosis present

## 2023-01-27 DIAGNOSIS — D500 Iron deficiency anemia secondary to blood loss (chronic): Secondary | ICD-10-CM

## 2023-01-27 NOTE — Progress Notes (Signed)
[facility] [phone]   [phone]  PROGRESS NOTE  Patient Care Team: Alysia Penna, MD as PCP - General (Internal Medicine)  Hematological/Oncological History # Microcytic Anemia  # Iron Deficiency Anemia 2/2 to GI Bleeding  03/25/2021: WBC 8.6, Hgb 10.5, MCV 78.7, Plt 389 08/21/2021: establish care with Dr. Leonides Schanz  6/12-7/12/2021: IV iron sucrose 200 mg x 5 doses  10/14/2021: White blood cell count 7.6, hemoglobin 12.3, MCV 81.4, and platelets of 297 8/21-8/28/2024: IV feraheme 510 mg x 2 doses .  Interval History:  Angie Olson 72 y.o. female with medical history significant for iron deficiency anemia secondary to GI bleeding who presents for a follow up visit. The patient's last visit was on 10/21/2022. In the interim since the last visit she she received 2 doses of IV Feraheme and tolerated them well.  On exam today Angie Olson reports she has had a boost in energy since her IV iron infusions.  Her energy is now about a 7 or 8 out of 10.  She notes that she does not feel tired as often.  She is able to walk her dog much further and can walk over 1 mile at a time.  She notes that she is not having any overt signs of bleeding, bruising, or dark stools.  She reports that she is not having any lightheadedness or shortness of breath but does have some occasional dizziness last for a second or 2.  She is doing her best to try to eat red meat at least once per day.  She notes that she has been losing weight and is unsure why.  She reports she does not eat very frequent meals and that may possibly be the cause.  Her weight is down to 185 pounds, from 194 pounds.  She reports that she does have diarrhea and can sometimes have the 5-6 loose watery stools per day.  She is not having any nausea or vomiting. She denies any fevers, chills, sweats, nausea, vomiting.  A full 10 point ROS is listed below.  MEDICAL HISTORY:  Past Medical History:  Diagnosis Date    Acid reflux    AKI (acute kidney injury) (HCC) 07/18/2014   Allergic rhinitis    anesthetic complication    Respiratory arrest 2019/02/21  Anxiety    PT'S SON DIED 2011/02/21, husband died in 04/06/2021Mother died in 09/21/2019  Arthritis    RA AND OA   Borderline hypercholesterolemia    on medication for TIA   Bronchitis    CHRONIC    Cancer (HCC)    melanoma - face    Carpal tunnel syndrome 04/01/2022   Closed fracture of single ramus of right pubis (HCC) 04/01/2022   Colitis 07/19/2014   Colon polyp    COVID-19 virus infection 02/17/2019   February 21, 2019  Environmental allergies    Epidural hematoma (HCC) 01/11/2014   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   GERD 12/07/2006   Qualifier: Diagnosis of   By: Claiborne Billings CMA, Jacqualynn       GERD (gastroesophageal reflux disease)    on PPI and carafte prn   H/O hiatal hernia    History of COVID-19 21-Feb-2019   History of kidney stones    Hypertension    IBS (irritable bowel syndrome)    Iron deficiency anemia 08/06/2021   Iron deficiency anemia due to chronic blood loss 08/21/2021   Irritable bowel syndrome 01/03/2020   LBP (low back  pain)    Nosocomial pneumonia 02/07/2019   OA (osteoarthritis)    Left Shoulder   Pain    RIGHT KNEE - TORN MENISCUS AND ACL   Pneumonia    X 3  - LAST TIME WAS 2013   PONV (postoperative nausea and vomiting) 09/2012   sore throat after ankle surgery 6 yrs ago and severe ponv 09-2012   Pre-diabetes    patient denies - states it was "years ago and no follow up"   Pulmonary nodule seen on imaging study    Shortness of breath    with activity,bronchitis   Stroke (HCC) 03/2011   TIA--PT EXPERIENCED NUMBNESS RT HAND AND ARM , HEADACHE AND NAUSEA. NO RESIDUAL PROBLEMS   Thrombocytosis 02/06/2019    SURGICAL HISTORY: Past Surgical History:  Procedure Laterality Date   ANKLE SURGERY  ~10 years ago   left   APPLICATION OF INTRAOPERATIVE CT SCAN N/A 03/27/2021   Procedure:  APPLICATION OF INTRAOPERATIVE CT SCAN;  Surgeon: Donalee Citrin, MD;  Location: [facility] OR;  Service: Neurosurgery;  Laterality: N/A;   BACK SURGERY  2011   lower   CARPAL TUNNEL RELEASE Right 2021   CHOLECYSTECTOMY  ~15 years ago   COLONOSCOPY  2005,2009, 2013   COLONOSCOPY W/ POLYPECTOMY  10/23/2020   Small adenoma   HARDWARE REMOVAL N/A 04/07/2016   Procedure: HARDWARE REMOVAL;  Surgeon: Donalee Citrin, MD;  Location: [facility] OR;  Service: Neurosurgery;  Laterality: N/A;   HARDWARE REMOVAL N/A 08/01/2020   Procedure: Removal of hardware with cutting the rod below the Lumbar one screws and removal of bilateral Lumbar four screws;  Surgeon: Donalee Citrin, MD;  Location: [facility] OR;  Service: Neurosurgery;  Laterality: N/A;   HARVEST BONE GRAFT N/A 07/05/2014   Procedure: HARVEST ILIAC BONE GRAFT;  Surgeon: Donalee Citrin, MD;  Location: [facility];  Service: Neurosurgery;  Laterality: N/A;   KNEE ARTHROSCOPY Right 10/13/2012   Procedure: RIGHT KNEE ARTHROSCOPY WITH DEBRIDEMENT, CHONDROPLASTY;  Surgeon: Loanne Drilling, MD;  Location: [facility] ORS;  Service: Orthopedics;  Laterality: Right;   KNEE ARTHROSCOPY Right 03/02/2013   Procedure: RIGHT ARTHROSCOPY KNEE WITH MEDIAL MENISCAL  DEBRIDEMENT;  Surgeon: Loanne Drilling, MD;  Location: [facility] ORS;  Service: Orthopedics;  Laterality: Right;   LAMINECTOMY WITH POSTERIOR LATERAL ARTHRODESIS LEVEL 1 N/A 08/01/2020   Procedure: posterolateral and Situ fusion;  Surgeon: Donalee Citrin, MD;  Location: [facility] OR;  Service: Neurosurgery;  Laterality: N/A;   LAMINECTOMY WITH POSTERIOR LATERAL ARTHRODESIS LEVEL 1 N/A 04/01/2021   Procedure: REEXPLORATION OF LUMBAR FUSION FOR REMOVAL AND REPLACEMENT OF MALPOSITIONED RIGHT SACRAL ONE PEDICLE SCREW;  Surgeon: Donalee Citrin, MD;  Location: [facility] OR;  Service: Neurosurgery;  Laterality: N/A;   LAMINECTOMY WITH POSTERIOR LATERAL ARTHRODESIS LEVEL 4 N/A 01/23/2020   Procedure: Posterior lateral fusion - Thoracic Ten -Thoracic Eleven - Thoracic Eleven -Thoracic  Twelve - Thoracic Twelve- Lumbar One - Lumbar One- Lumbar Two revision with removal globus;  Surgeon: Donalee Citrin, MD;  Location: [facility] OR;  Service: Neurosurgery;  Laterality: N/A;  Eleven - Thoracic Eleven -Thoracic Twelve - Thoracic Twelve- Lumbar One - Lumbar One- Lumbar Two revision with removal globus   LAMINECTOMY WITH POSTERIOR LATERAL ARTHRODESIS LEVEL 4 N/A 03/27/2021   Procedure: Posterior lateral fusion - Lumbar two-Lumbar three - Lumbar three-Lumbar four - Lumbar four-Lumbar five - Lumbar five-Sacral one - Pelvis with navigation Airo;  Surgeon: Donalee Citrin, MD;  Location: [facility] OR;  Service: Neurosurgery;  Laterality: N/A;   LUMBAR FUSION  07/05/2014  LUMBAR WOUND DEBRIDEMENT N/A 01/11/2014   Procedure: Irrigation and Debridement Lumbar Wound for hematoma;  Surgeon: Mariam Dollar, MD;  Location: [facility];  Service: Neurosurgery;  Laterality: N/A;   MAXIMUM ACCESS (MAS)POSTERIOR LUMBAR INTERBODY FUSION (PLIF) 1 LEVEL N/A 12/05/2013   Procedure: FOR MAXIMUM ACCESS (MAS) POSTERIOR LUMBAR INTERBODY FUSION (PLIF)LUMBAR FIVE-SACRAL-ONE,REMOVAL HARDWARE LUMBAR FOUR-FIVE;  Surgeon: Mariam Dollar, MD;  Location: [facility];  Service: Neurosurgery;  Laterality: N/A;   Mohr's procedure  02/2015   face   Right Hip arthroplasty     10/01/16 Dr. Lequita Halt   ROOT CANAL     SHOULDER SURGERY Left ~14 years ago   "scope"   thumb surgery Left ~14 years ago   x2   TOTAL HIP ARTHROPLASTY Left 04/25/2015   Procedure: LEFT TOTAL HIP ARTHROPLASTY ANTERIOR APPROACH;  Surgeon: Ollen Gross, MD;  Location: [facility] OR[facility];  Service: Orthopedics;  Laterality: Left;   TOTAL HIP ARTHROPLASTY Right 10/01/2016   Procedure: RIGHT TOTAL HIP ARTHROPLASTY ANTERIOR APPROACH;  Surgeon: Ollen Gross, MD;  Location: [facility] ORS;  Service: Orthopedics;  Laterality: Right;   TOTAL KNEE ARTHROPLASTY Right 07/25/2013   Procedure: RIGHT TOTAL KNEE ARTHROPLASTY;  Surgeon: Loanne Drilling, MD;  Location: [facility] OR[facility];  Service: Orthopedics;   Laterality: Right;   TOTAL KNEE ARTHROPLASTY WITH REVISION COMPONENTS Right 05/28/2016   Procedure: RIGHT TIBIAL POLYETHYLENE KNEE REVISION;  Surgeon: Ollen Gross, MD;  Location: [facility] OR[facility];  Service: Orthopedics;  Laterality: Right;  requests   TRIGGER FINGER RELEASE  ~6 years ago   X2 ON RIGHT HAND   TUBAL LIGATION      SOCIAL HISTORY: Social History   Socioeconomic History   Marital status: Widowed    Spouse name: Not on file   Number of children: Not on file   Years of education: Not on file   Highest education level: Not on file  Occupational History   Not on file  Tobacco Use   Smoking status: Never   Smokeless tobacco: Never  Vaping Use   Vaping status: Never Used  Substance and Sexual Activity   Alcohol use: No   Drug use: No   Sexual activity: Yes  Other Topics Concern   Not on file  Social History Narrative   Not on file   Social Determinants of Health   Financial Resource Strain: Not on file  Food Insecurity: No Food Insecurity (09/10/2022)   Received from [facility]   Hunger Vital Sign    Worried About Running Out of Food in the Last Year: Never true    Ran Out of Food in the Last Year: Never true  Transportation Needs: No Transportation Needs (09/10/2022)   Received from [facility] - Transportation    Lack of Transportation (Medical): No    Lack of Transportation (Non-Medical): No  Physical Activity: Not on file  Stress: No Stress Concern Present (09/10/2022)   Received from [facility] - Occupational Stress Questionnaire    Feeling of Stress : Not at all  Social Connections: Unknown (09/01/2022)   Received from [facility]   Social Network    Social Network: Not on file  Intimate Partner Violence: Not At Risk (09/10/2022)   Received from [facility]   HITS    Over the last 12 months how often did your partner physically hurt you?: Never    Over the last 12 months how often did  your partner insult you  or talk down to you?: Never    Over the last 12 months how often did your partner threaten you with physical harm?: Never    Over the last 12 months how often did your partner scream or curse at you?: Never    FAMILY HISTORY: Family History  Problem Relation Age of Onset   Heart disease Sister    Lung cancer Brother    Heart disease Brother    CAD Brother    Heart attack Mother    Emphysema Father    Hypertension Daughter    Colon cancer Neg Hx     ALLERGIES:  is allergic to hydrocodone-acetaminophen, methotrexate derivatives, metoclopramide, pseudoephedrine, vicodin [hydrocodone-acetaminophen], molds & smuts, other, and oxycodone.  MEDICATIONS:  Current Outpatient Medications  Medication Sig Dispense Refill   acetaminophen (TYLENOL) 500 MG tablet Take 1,000 mg by mouth as needed for moderate pain.     alendronate (FOSAMAX) 70 MG tablet Take 70 mg by mouth once a week.     ALPRAZolam (XANAX) 1 MG tablet Take 1 mg by mouth at bedtime.     amLODipine (NORVASC) 10 MG tablet Take 10 mg by mouth daily.     atorvastatin (LIPITOR) 20 MG tablet Take 20 mg by mouth in the morning.     benzonatate (TESSALON) 100 MG capsule Take 100 mg by mouth 3 (three) times daily as needed for cough.     Calcium Carbonate-Vitamin D (CALCIUM-VITAMIN D3 PO) Take 1 tablet by mouth daily.     Capsaicin-Menthol (SALONPAS GEL-PATCH HOT) 0.025-1.25 % PTCH Apply 1 patch topically daily as needed (pain).     cetirizine (ZYRTEC) 10 MG tablet Take 10 mg by mouth in the morning.     cholecalciferol (VITAMIN D3) 25 MCG (1000 UNIT) tablet Take 2,000 Units by mouth daily.     diclofenac Sodium (VOLTAREN) 1 % GEL Apply 1 Application topically as needed (pain).     esomeprazole (NEXIUM) 20 MG capsule Take 20 mg by mouth daily before breakfast.      gabapentin (NEURONTIN) 300 MG capsule Take 600 mg by mouth 2 (two) times daily.     hydroxychloroquine (PLAQUENIL) 200 MG tablet Take 200 mg by mouth in  the morning.     ibuprofen (ADVIL) 200 MG tablet Take 400 mg by mouth as needed for moderate pain.     leflunomide (ARAVA) 20 MG tablet Take 20 mg by mouth in the morning.     lisinopril (PRINIVIL,ZESTRIL) 40 MG tablet Take 40 mg by mouth in the morning.     Multiple Vitamin (MULTI VITAMIN) TABS 1 tablet Orally Once a day     Multiple Vitamins-Minerals (HAIR/SKIN/NAILS) CAPS Take 1 tablet by mouth daily.     Multiple Vitamins-Minerals (MULTIVITAMIN WITH MINERALS) tablet Take 1 tablet by mouth daily.     nirmatrelvir & ritonavir (PAXLOVID, 300/100,) 20 x 150 MG & 10 x 100MG  TBPK Take 2 tablets nirmtrelvir and 1 tablet ritonavir twice daily. 30 tablet 0   nortriptyline (PAMELOR) 25 MG capsule Take 25 mg by mouth at bedtime.   2   predniSONE (DELTASONE) 20 MG tablet Take 2 tablets daily with breakfast. 10 tablet 0   predniSONE (DELTASONE) 5 MG tablet Take 1 tablet (5 mg total) by mouth daily with breakfast.     PRESCRIPTION MEDICATION Place 1 drop into both eyes in the morning, at noon, in the evening, and at bedtime. Unknown eye drop     promethazine-dextromethorphan (PROMETHAZINE-DM) 6.25-15 MG/5ML syrup Take 5 mLs by mouth  3 (three) times daily as needed for cough. 200 mL 0   sucralfate (CARAFATE) 1 G tablet Take 1 g by mouth as needed (indigestion).     No current facility-administered medications for this visit.    REVIEW OF SYSTEMS:   Constitutional: ( - ) fevers, ( - )  chills , ( - ) night sweats Eyes: ( - ) blurriness of vision, ( - ) double vision, ( - ) watery eyes Ears, nose, mouth, throat, and face: ( - ) mucositis, ( - ) sore throat Respiratory: ( - ) cough, ( - ) dyspnea, ( - ) wheezes Cardiovascular: ( - ) palpitation, ( - ) chest discomfort, ( - ) lower extremity swelling Gastrointestinal:  ( - ) nausea, ( - ) heartburn, ( - ) change in bowel habits Skin: ( - ) abnormal skin rashes Lymphatics: ( - ) new lymphadenopathy, ( - ) easy bruising Neurological: ( - ) numbness, ( - )  tingling, ( - ) new weaknesses Behavioral/Psych: ( - ) mood change, ( - ) new changes  All other systems were reviewed with the patient and are negative.  PHYSICAL EXAMINATION:  Vitals:   01/27/23 0858  BP: (!) 159/80  Pulse: 84  Resp: 16  Temp: 98.4 F (36.9 C)  SpO2: 99%     Filed Weights   01/27/23 0858  Weight: 185 lb 6.4 oz (84.1 kg)      GENERAL: Well-appearing elderly Caucasian female, alert, no distress and comfortable SKIN: skin color, texture, turgor are normal, no rashes or significant lesions EYES: conjunctiva are pink and non-injected, sclera clear LUNGS: clear to auscultation and percussion with normal breathing effort HEART: regular rate & rhythm and no murmurs and no lower extremity edema Musculoskeletal: no cyanosis of digits and no clubbing  PSYCH: alert & oriented x 3, fluent speech NEURO: no focal motor/sensory deficits  LABORATORY DATA:  I have reviewed the data as listed    Latest Ref Rng & Units 01/14/2023    2:12 PM 10/14/2022    2:17 PM 06/23/2022   12:00 PM  CBC  WBC 4.0 - 10.5 K/uL 6.2  5.4  7.8   Hemoglobin 12.0 - 15.0 g/dL 63.8  75.6  43.3   Hematocrit 36.0 - 46.0 % 42.3  34.9  41.2   Platelets 150 - 400 K/uL 320  404  333        Latest Ref Rng & Units 01/14/2023    2:12 PM 10/14/2022    2:17 PM 06/24/2022    5:51 AM  CMP  Glucose 70 - 99 mg/dL 295  188  416   BUN 8 - 23 mg/dL 16  11  15    Creatinine 0.44 - 1.00 mg/dL 6.06  3.01  6.01   Sodium 135 - 145 mmol/L 141  141  140   Potassium 3.5 - 5.1 mmol/L 4.0  3.5  3.7   Chloride 98 - 111 mmol/L 106  106  105   CO2 22 - 32 mmol/L 28  27  26    Calcium 8.9 - 10.3 mg/dL 9.9  9.7  8.7   Total Protein 6.5 - 8.1 g/dL 6.9  6.2  6.3   Total Bilirubin 0.3 - 1.2 mg/dL 0.4  0.3  0.4   Alkaline Phos 38 - 126 U/L 69  105  59   AST 15 - 41 U/L 16  39  20   ALT 0 - 44 U/L 13  30  29  Lab Results  Component Value Date   MPROTEIN Not Observed 08/21/2021   Lab Results  Component Value Date    KPAFRELGTCHN 11.3 08/21/2021   LAMBDASER 7.4 08/21/2021   KAPLAMBRATIO 1.53 08/21/2021    RADIOGRAPHIC STUDIES: No results found.  ASSESSMENT & PLAN Angie Olson 71 y.o. female with medical history significant for iron deficiency anemia secondary to GI bleeding who presents for a follow up visit.  After review of the labs, review of the records, and discussion with the patient the patients findings are most consistent with iron deficiency anemia likely secondary to GI bleeding.   # Iron Deficiency Anemia 2/2 to GI Bleeding -- Findings are consistent with iron deficiency anemia secondary to likely GI bleeding --patient underwent EGD on 09/24/2021 and colonoscopy on 10/23/2020. No clear source of bleeding identified, however inflammation was noted in the stomach. Follows with Dr. Leone Payor.  -- Today we ordered iron panel and ferritin as well as reticulocytes, CBC, and CMP -- Unable to tolerate p.o. iron therapy due to severe constipation. -- Patient underwent 5 doses of IV iron sucrose 200 mg from 08/26/2021 to 09/23/2021 --Labs today show white blood cell count 6.2, Hgb 14.1, MCV 84.4, Plt 320. Ferritin 302, Iron sat 24%.  --Will plan to proceed with IV iron if she is persistently iron deficient.  --RTC in 6 months with labs 1 week before.     No orders of the defined types were placed in this encounter.   All questions were answered. The patient knows to call the clinic with any problems, questions or concerns.  A total of more than 30 minutes were spent on this encounter with face-to-face time and non-face-to-face time, including preparing to see the patient, ordering tests and/or medications, counseling the patient and coordination of care as outlined above.   Ulysees Barns, MD Department of Hematology/Oncology [facility] Phone: [phone] Pager: [phone] Email: [email or web address]  01/27/2023 9:21 AM

## 2023-06-26 ENCOUNTER — Telehealth: Payer: Self-pay | Admitting: Hematology and Oncology

## 2023-07-17 ENCOUNTER — Other Ambulatory Visit: Payer: Self-pay | Admitting: Hematology and Oncology

## 2023-07-17 DIAGNOSIS — D500 Iron deficiency anemia secondary to blood loss (chronic): Secondary | ICD-10-CM

## 2023-07-20 ENCOUNTER — Inpatient Hospital Stay: Payer: Medicare Other | Attending: Physician Assistant

## 2023-07-20 DIAGNOSIS — D500 Iron deficiency anemia secondary to blood loss (chronic): Secondary | ICD-10-CM | POA: Diagnosis present

## 2023-07-20 DIAGNOSIS — Z801 Family history of malignant neoplasm of trachea, bronchus and lung: Secondary | ICD-10-CM | POA: Diagnosis not present

## 2023-07-20 LAB — CMP (CANCER CENTER ONLY)
ALT: 14 U/L (ref 0–44)
AST: 15 U/L (ref 15–41)
Albumin: 4.2 g/dL (ref 3.5–5.0)
Alkaline Phosphatase: 68 U/L (ref 38–126)
Anion gap: 5 (ref 5–15)
BUN: 18 mg/dL (ref 8–23)
CO2: 28 mmol/L (ref 22–32)
Calcium: 9.1 mg/dL (ref 8.9–10.3)
Chloride: 107 mmol/L (ref 98–111)
Creatinine: 0.81 mg/dL (ref 0.44–1.00)
GFR, Estimated: >60 mL/min (ref >60)
Glucose, Bld: 104 mg/dL — ABNORMAL HIGH (ref 70–99)
Potassium: 3.8 mmol/L (ref 3.5–5.1)
Sodium: 140 mmol/L (ref 135–145)
Total Bilirubin: 0.4 mg/dL (ref 0.0–1.2)
Total Protein: 6.1 g/dL — ABNORMAL LOW (ref 6.5–8.1)

## 2023-07-20 LAB — IRON AND IRON BINDING CAPACITY (CC-WL,HP ONLY)
Iron: 77 ug/dL (ref 28–170)
Saturation Ratios: 28 % (ref 10.4–31.8)
TIBC: 280 ug/dL (ref 250–450)
UIBC: 203 ug/dL (ref 148–442)

## 2023-07-20 LAB — CBC WITH DIFFERENTIAL (CANCER CENTER ONLY)
Abs Immature Granulocytes: 0.04 10*3/uL (ref 0.00–0.07)
Basophils Absolute: 0 10*3/uL (ref 0.0–0.1)
Basophils Relative: 0 %
Eosinophils Absolute: 0.1 10*3/uL (ref 0.0–0.5)
Eosinophils Relative: 2 %
HCT: 38.1 % (ref 36.0–46.0)
Hemoglobin: 12.8 g/dL (ref 12.0–15.0)
Immature Granulocytes: 0 %
Lymphocytes Relative: 14 %
Lymphs Abs: 1.3 10*3/uL (ref 0.7–4.0)
MCH: 29.6 pg (ref 26.0–34.0)
MCHC: 33.6 g/dL (ref 30.0–36.0)
MCV: 88.2 fL (ref 80.0–100.0)
Monocytes Absolute: 0.6 10*3/uL (ref 0.1–1.0)
Monocytes Relative: 6 %
Neutro Abs: 7.2 10*3/uL (ref 1.7–7.7)
Neutrophils Relative %: 78 %
Platelet Count: 314 10*3/uL (ref 150–400)
RBC: 4.32 MIL/uL (ref 3.87–5.11)
RDW: 13.3 % (ref 11.5–15.5)
WBC Count: 9.2 10*3/uL (ref 4.0–10.5)
nRBC: 0 % (ref 0.0–0.2)

## 2023-07-20 LAB — RETIC PANEL
Immature Retic Fract: 8.2 % (ref 2.3–15.9)
RBC.: 4.31 MIL/uL (ref 3.87–5.11)
Retic Count, Absolute: 78.9 10*3/uL (ref 19.0–186.0)
Retic Ct Pct: 1.8 % (ref 0.4–3.1)
Reticulocyte Hemoglobin: 34.4 pg (ref >27.9)

## 2023-07-20 LAB — FERRITIN: Ferritin: 243 ng/mL (ref 11–307)

## 2023-07-27 ENCOUNTER — Ambulatory Visit: Payer: Medicare Other | Admitting: Hematology and Oncology

## 2023-07-29 ENCOUNTER — Telehealth: Payer: Self-pay | Admitting: Hematology and Oncology

## 2023-07-31 ENCOUNTER — Inpatient Hospital Stay: Admitting: Physician Assistant

## 2023-08-14 ENCOUNTER — Other Ambulatory Visit: Payer: Self-pay | Admitting: Hematology and Oncology

## 2023-08-14 ENCOUNTER — Inpatient Hospital Stay: Admitting: Hematology and Oncology

## 2023-08-14 VITALS — BP 150/81 | HR 81 | Temp 97.6°F | Resp 15 | Wt 196.1 lb

## 2023-08-14 DIAGNOSIS — D500 Iron deficiency anemia secondary to blood loss (chronic): Secondary | ICD-10-CM

## 2023-08-14 NOTE — Progress Notes (Signed)
 [facility] [phone]   [phone]  PROGRESS NOTE  Patient Care Team: Barnetta Liberty, MD as PCP - General (Internal Medicine)  Hematological/Oncological History # Microcytic Anemia  # Iron  Deficiency Anemia 2/2 to GI Bleeding  03/25/2021: WBC 8.6, Hgb 10.5, MCV 78.7, Plt 389 08/21/2021: establish care with Dr. Rosaline Coma  6/12-7/12/2021: IV iron  sucrose 200 mg x 5 doses  10/14/2021: White blood cell count 7.6, hemoglobin 12.3, MCV 81.4, and platelets of 297 8/21-8/28/2024: IV feraheme 510 mg x 2 doses .  Interval History:  Angie Olson 73 y.o. female with medical history significant for iron  deficiency anemia secondary to GI bleeding who presents for a follow up visit. The patient's last visit was on 10/21/2022. In the interim since the last visit she she received 2 doses of IV Feraheme and tolerated them well.  On exam today Angie Olson reports she has been well overall in the interim since her last visit 6 months ago.  She reports that she is "feeling her age".  She notes her energy level is about a 6 out of 10.  Otherwise she is been eating well.  She notes she does her best to try to eat iron  rich food including red meat and veggies.  She notes she went through a period where she craves sweets and did gain about 15 pounds worth of weight.  She notes that she has good days and bad days.  She is not currently taking any iron  pills.  She denies any overt signs of bleeding such as gum bleeding, dark stools, or blood in the urine/stool.  She denies any lightheadedness, dizziness, or shortness of breath.  A full 10 point ROS is otherwise negative.  MEDICAL HISTORY:  Past Medical History:  Diagnosis Date   Acid reflux    AKI (acute kidney injury) (HCC) 07/18/2014   Allergic rhinitis    anesthetic complication    Respiratory arrest 2019/01/22  Anxiety    PT'S SON DIED 08-22-10, husband died in 03-07-2021Mother died in 08-22-2019  Arthritis    RA AND OA    Borderline hypercholesterolemia    on medication for TIA   Bronchitis    CHRONIC    Cancer (HCC)    melanoma - face    Carpal tunnel syndrome 04/01/2022   Closed fracture of single ramus of right pubis (HCC) 04/01/2022   Colitis 07/19/2014   Colon polyp    COVID-19 virus infection 02/17/2019   2019/01/22  Environmental allergies    Epidural hematoma (HCC) 01/11/2014   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   GERD 12/07/2006   Qualifier: Diagnosis of   By: Dene Fines CMA, Angie Olson       GERD (gastroesophageal reflux disease)    on PPI and carafte prn   H/O hiatal hernia    History of COVID-19 01-22-19   History of kidney stones    Hypertension    IBS (irritable bowel syndrome)    Iron  deficiency anemia 08/06/2021   Iron  deficiency anemia due to chronic blood loss 08/21/2021   Irritable bowel syndrome 01/03/2020   LBP (low back pain)    Nosocomial pneumonia 02/07/2019   OA (osteoarthritis)    Left Shoulder   Pain    RIGHT KNEE - TORN MENISCUS AND ACL   Pneumonia    X 3  - LAST TIME WAS 22-Aug-2011   PONV (postoperative nausea and vomiting) 09/2012   sore throat after ankle surgery 6  yrs ago and severe ponv 09-2012   Pre-diabetes    patient denies - states it was "years ago and no follow up"   Pulmonary nodule seen on imaging study    Shortness of breath    with activity,bronchitis   Stroke (HCC) 03/2011   TIA--PT EXPERIENCED NUMBNESS RT HAND AND ARM , HEADACHE AND NAUSEA. NO RESIDUAL PROBLEMS   Thrombocytosis 02/06/2019    SURGICAL HISTORY: Past Surgical History:  Procedure Laterality Date   ANKLE SURGERY  ~10 years ago   left   APPLICATION OF INTRAOPERATIVE CT SCAN N/A 03/27/2021   Procedure: APPLICATION OF INTRAOPERATIVE CT SCAN;  Surgeon: Gearl Keens, MD;  Location: [facility] OR;  Service: Neurosurgery;  Laterality: N/A;   BACK SURGERY  2011   lower   CARPAL TUNNEL RELEASE Right 2021   CHOLECYSTECTOMY  ~15 years ago   COLONOSCOPY  2005,2009, 2013    COLONOSCOPY W/ POLYPECTOMY  10/23/2020   Small adenoma   HARDWARE REMOVAL N/A 04/07/2016   Procedure: HARDWARE REMOVAL;  Surgeon: Gearl Keens, MD;  Location: [facility] OR;  Service: Neurosurgery;  Laterality: N/A;   HARDWARE REMOVAL N/A 08/01/2020   Procedure: Removal of hardware with cutting the rod below the Lumbar one screws and removal of bilateral Lumbar four screws;  Surgeon: Gearl Keens, MD;  Location: [facility] OR;  Service: Neurosurgery;  Laterality: N/A;   HARVEST BONE GRAFT N/A 07/05/2014   Procedure: HARVEST ILIAC BONE GRAFT;  Surgeon: Gearl Keens, MD;  Location: [facility];  Service: Neurosurgery;  Laterality: N/A;   KNEE ARTHROSCOPY Right 10/13/2012   Procedure: RIGHT KNEE ARTHROSCOPY WITH DEBRIDEMENT, CHONDROPLASTY;  Surgeon: Aurther Blue, MD;  Location: [facility];  Service: Orthopedics;  Laterality: Right;   KNEE ARTHROSCOPY Right 03/02/2013   Procedure: RIGHT ARTHROSCOPY KNEE WITH MEDIAL MENISCAL  DEBRIDEMENT;  Surgeon: Aurther Blue, MD;  Location: [facility];  Service: Orthopedics;  Laterality: Right;   LAMINECTOMY WITH POSTERIOR LATERAL ARTHRODESIS LEVEL 1 N/A 08/01/2020   Procedure: posterolateral and Situ fusion;  Surgeon: Gearl Keens, MD;  Location: [facility] OR;  Service: Neurosurgery;  Laterality: N/A;   LAMINECTOMY WITH POSTERIOR LATERAL ARTHRODESIS LEVEL 1 N/A 04/01/2021   Procedure: REEXPLORATION OF LUMBAR FUSION FOR REMOVAL AND REPLACEMENT OF MALPOSITIONED RIGHT SACRAL ONE PEDICLE SCREW;  Surgeon: Gearl Keens, MD;  Location: [facility] OR;  Service: Neurosurgery;  Laterality: N/A;   LAMINECTOMY WITH POSTERIOR LATERAL ARTHRODESIS LEVEL 4 N/A 01/23/2020   Procedure: Posterior lateral fusion - Thoracic Ten -Thoracic Eleven - Thoracic Eleven -Thoracic Twelve - Thoracic Twelve- Lumbar One - Lumbar One- Lumbar Two revision with removal globus;  Surgeon: Gearl Keens, MD;  Location: [facility] OR;  Service: Neurosurgery;  Laterality: N/A;  Eleven - Thoracic Eleven -Thoracic Twelve - Thoracic Twelve- Lumbar One - Lumbar  One- Lumbar Two revision with removal globus   LAMINECTOMY WITH POSTERIOR LATERAL ARTHRODESIS LEVEL 4 N/A 03/27/2021   Procedure: Posterior lateral fusion - Lumbar two-Lumbar three - Lumbar three-Lumbar four - Lumbar four-Lumbar five - Lumbar five-Sacral one - Pelvis with navigation Airo;  Surgeon: Gearl Keens, MD;  Location: [facility] OR;  Service: Neurosurgery;  Laterality: N/A;   LUMBAR FUSION  07/05/2014   LUMBAR WOUND DEBRIDEMENT N/A 01/11/2014   Procedure: Irrigation and Debridement Lumbar Wound for hematoma;  Surgeon: Ferris Hua, MD;  Location: [facility];  Service: Neurosurgery;  Laterality: N/A;   MAXIMUM ACCESS (MAS)POSTERIOR LUMBAR INTERBODY FUSION (PLIF) 1 LEVEL N/A 12/05/2013   Procedure: FOR MAXIMUM ACCESS (MAS) POSTERIOR LUMBAR INTERBODY FUSION (PLIF)LUMBAR  FIVE-SACRAL-ONE,REMOVAL HARDWARE LUMBAR FOUR-FIVE;  Surgeon: Ferris Hua, MD;  Location: [facility];  Service: Neurosurgery;  Laterality: N/A;   Mohr's procedure  02/2015   face   Right Hip arthroplasty     10/01/16 Dr. France Ina   ROOT CANAL     SHOULDER SURGERY Left ~14 years ago   "scope"   thumb surgery Left ~14 years ago   x2   TOTAL HIP ARTHROPLASTY Left 04/25/2015   Procedure: LEFT TOTAL HIP ARTHROPLASTY ANTERIOR APPROACH;  Surgeon: Liliane Rei, MD;  Location: [facility];  Service: Orthopedics;  Laterality: Left;   TOTAL HIP ARTHROPLASTY Right 10/01/2016   Procedure: RIGHT TOTAL HIP ARTHROPLASTY ANTERIOR APPROACH;  Surgeon: Liliane Rei, MD;  Location: [facility];  Service: Orthopedics;  Laterality: Right;   TOTAL KNEE ARTHROPLASTY Right 07/25/2013   Procedure: RIGHT TOTAL KNEE ARTHROPLASTY;  Surgeon: Aurther Blue, MD;  Location: [facility];  Service: Orthopedics;  Laterality: Right;   TOTAL KNEE ARTHROPLASTY WITH REVISION COMPONENTS Right 05/28/2016   Procedure: RIGHT TIBIAL POLYETHYLENE KNEE REVISION;  Surgeon: Liliane Rei, MD;  Location: [facility];  Service: Orthopedics;  Laterality: Right;  requests   TRIGGER FINGER  RELEASE  ~6 years ago   X2 ON RIGHT HAND   TUBAL LIGATION      SOCIAL HISTORY: Social History   Socioeconomic History   Marital status: Widowed    Spouse name: Not on file   Number of children: Not on file   Years of education: Not on file   Highest education level: Not on file  Occupational History   Not on file  Tobacco Use   Smoking status: Never   Smokeless tobacco: Never  Vaping Use   Vaping status: Never Used  Substance and Sexual Activity   Alcohol use: No   Drug use: No   Sexual activity: Yes  Other Topics Concern   Not on file  Social History Narrative   Not on file   Social Drivers of Health   Financial Resource Strain: Not on file  Food Insecurity: No Food Insecurity (09/10/2022)   Received from [facility]   Hunger Vital Sign    Worried About Running Out of Food in the Last Year: Never true    Ran Out of Food in the Last Year: Never true  Transportation Needs: No Transportation Needs (09/10/2022)   Received from [facility] - Transportation    Lack of Transportation (Medical): No    Lack of Transportation (Non-Medical): No  Physical Activity: Not on file  Stress: No Stress Concern Present (09/10/2022)   Received from [facility] - Occupational Stress Questionnaire    Feeling of Stress : Not at all  Social Connections: Unknown (09/01/2022)   Received from [facility]   Social Network    Social Network: Not on file  Intimate Partner Violence: Not At Risk (09/10/2022)   Received from [facility]   HITS    Over the last 12 months how often did your partner physically hurt you?: Never    Over the last 12 months how often did your partner insult you or talk down to you?: Never    Over the last 12 months how often did your partner threaten you with physical harm?: Never    Over the last 12 months how often did your partner scream or curse at you?: Never    FAMILY HISTORY: Family History   Problem Relation Age of Onset  Heart disease Sister    Lung cancer Brother    Heart disease Brother    CAD Brother    Heart attack Mother    Emphysema Father    Hypertension Daughter    Colon cancer Neg Hx     ALLERGIES:  is allergic to hydrocodone -acetaminophen , methotrexate derivatives, metoclopramide , pseudoephedrine, vicodin [hydrocodone -acetaminophen ], molds & smuts, other, and oxycodone .  MEDICATIONS:  Current Outpatient Medications  Medication Sig Dispense Refill   acetaminophen  (TYLENOL ) 500 MG tablet Take 1,000 mg by mouth as needed for moderate pain.     alendronate (FOSAMAX) 70 MG tablet Take 70 mg by mouth once a week.     ALPRAZolam  (XANAX ) 1 MG tablet Take 1 mg by mouth at bedtime.     amLODipine  (NORVASC ) 10 MG tablet Take 10 mg by mouth daily.     atorvastatin  (LIPITOR) 20 MG tablet Take 20 mg by mouth in the morning.     benzonatate (TESSALON) 100 MG capsule Take 100 mg by mouth 3 (three) times daily as needed for cough.     Calcium  Carbonate-Vitamin D  (CALCIUM -VITAMIN D3 PO) Take 1 tablet by mouth daily.     Capsaicin -Menthol  (SALONPAS GEL-PATCH HOT) 0.025-1.25 % PTCH Apply 1 patch topically daily as needed (pain).     cetirizine  (ZYRTEC ) 10 MG tablet Take 10 mg by mouth in the morning.     cholecalciferol  (VITAMIN D3) 25 MCG (1000 UNIT) tablet Take 2,000 Units by mouth daily.     diclofenac  Sodium (VOLTAREN ) 1 % GEL Apply 1 Application topically as needed (pain).     esomeprazole  (NEXIUM ) 20 MG capsule Take 20 mg by mouth daily before breakfast.      gabapentin  (NEURONTIN ) 300 MG capsule Take 600 mg by mouth 2 (two) times daily.     hydroxychloroquine  (PLAQUENIL ) 200 MG tablet Take 200 mg by mouth in the morning.     ibuprofen  (ADVIL ) 200 MG tablet Take 400 mg by mouth as needed for moderate pain.     leflunomide  (ARAVA ) 20 MG tablet Take 20 mg by mouth in the morning.     lisinopril  (PRINIVIL ,ZESTRIL ) 40 MG tablet Take 40 mg by mouth in the morning.      Multiple Vitamin (MULTI VITAMIN) TABS 1 tablet Orally Once a day     Multiple Vitamins-Minerals (HAIR/SKIN/NAILS) CAPS Take 1 tablet by mouth daily.     Multiple Vitamins-Minerals (MULTIVITAMIN WITH MINERALS) tablet Take 1 tablet by mouth daily.     nirmatrelvir & ritonavir  (PAXLOVID , 300/100,) 20 x 150 MG & 10 x 100MG  TBPK Take 2 tablets nirmtrelvir and 1 tablet ritonavir twice daily. 30 tablet 0   nortriptyline  (PAMELOR ) 25 MG capsule Take 25 mg by mouth at bedtime.   2   predniSONE  (DELTASONE ) 20 MG tablet Take 2 tablets daily with breakfast. 10 tablet 0   predniSONE  (DELTASONE ) 5 MG tablet Take 1 tablet (5 mg total) by mouth daily with breakfast.     PRESCRIPTION MEDICATION Place 1 drop into both eyes in the morning, at noon, in the evening, and at bedtime. Unknown eye drop     promethazine -dextromethorphan (PROMETHAZINE -DM) 6.25-15 MG/5ML syrup Take 5 mLs by mouth 3 (three) times daily as needed for cough. 200 mL 0   sucralfate  (CARAFATE ) 1 G tablet Take 1 g by mouth as needed (indigestion).     No current facility-administered medications for this visit.    REVIEW OF SYSTEMS:   Constitutional: ( - ) fevers, ( - )  chills , ( - )  night sweats Eyes: ( - ) blurriness of vision, ( - ) double vision, ( - ) watery eyes Ears, nose, mouth, throat, and face: ( - ) mucositis, ( - ) sore throat Respiratory: ( - ) cough, ( - ) dyspnea, ( - ) wheezes Cardiovascular: ( - ) palpitation, ( - ) chest discomfort, ( - ) lower extremity swelling Gastrointestinal:  ( - ) nausea, ( - ) heartburn, ( - ) change in bowel habits Skin: ( - ) abnormal skin rashes Lymphatics: ( - ) new lymphadenopathy, ( - ) easy bruising Neurological: ( - ) numbness, ( - ) tingling, ( - ) new weaknesses Behavioral/Psych: ( - ) mood change, ( - ) new changes  All other systems were reviewed with the patient and are negative.  PHYSICAL EXAMINATION:  Vitals:   08/14/23 1056 08/14/23 1115  BP: (!) 163/77 (!) 150/81  Pulse: 81    Resp: 15   Temp: 97.6 F (36.4 C)   SpO2: 99%       Filed Weights   08/14/23 1056  Weight: 196 lb 1.6 oz (89 kg)       GENERAL: Well-appearing elderly Caucasian female, alert, no distress and comfortable SKIN: skin color, texture, turgor are normal, no rashes or significant lesions EYES: conjunctiva are pink and non-injected, sclera clear LUNGS: clear to auscultation and percussion with normal breathing effort HEART: regular rate & rhythm and no murmurs and no lower extremity edema Musculoskeletal: no cyanosis of digits and no clubbing  PSYCH: alert & oriented x 3, fluent speech NEURO: no focal motor/sensory deficits  LABORATORY DATA:  I have reviewed the data as listed    Latest Ref Rng & Units 07/20/2023   10:32 AM 01/14/2023    2:12 PM 10/14/2022    2:17 PM  CBC  WBC 4.0 - 10.5 K/uL 9.2  6.2  5.4   Hemoglobin 12.0 - 15.0 g/dL 16.1  09.6  04.5   Hematocrit 36.0 - 46.0 % 38.1  42.3  34.9   Platelets 150 - 400 K/uL 314  320  404        Latest Ref Rng & Units 07/20/2023   10:32 AM 01/14/2023    2:12 PM 10/14/2022    2:17 PM  CMP  Glucose 70 - 99 mg/dL 409  811  914   BUN 8 - 23 mg/dL 18  16  11    Creatinine 0.44 - 1.00 mg/dL 7.82  9.56  2.13   Sodium 135 - 145 mmol/L 140  141  141   Potassium 3.5 - 5.1 mmol/L 3.8  4.0  3.5   Chloride 98 - 111 mmol/L 107  106  106   CO2 22 - 32 mmol/L 28  28  27    Calcium  8.9 - 10.3 mg/dL 9.1  9.9  9.7   Total Protein 6.5 - 8.1 g/dL 6.1  6.9  6.2   Total Bilirubin 0.0 - 1.2 mg/dL 0.4  0.4  0.3   Alkaline Phos 38 - 126 U/L 68  69  105   AST 15 - 41 U/L 15  16  39   ALT 0 - 44 U/L 14  13  30      Lab Results  Component Value Date   MPROTEIN Not Observed 08/21/2021   Lab Results  Component Value Date   KPAFRELGTCHN 11.3 08/21/2021   LAMBDASER 7.4 08/21/2021   KAPLAMBRATIO 1.53 08/21/2021    RADIOGRAPHIC STUDIES: No results found.  ASSESSMENT & PLAN Angie Olson  72 y.o. female with medical history significant for  iron  deficiency anemia secondary to GI bleeding who presents for a follow up visit.  After review of the labs, review of the records, and discussion with the patient the patients findings are most consistent with iron  deficiency anemia likely secondary to GI bleeding.   # Iron  Deficiency Anemia 2/2 to GI Bleeding -- Findings are consistent with iron  deficiency anemia secondary to likely GI bleeding --patient underwent EGD on 09/24/2021 and colonoscopy on 10/23/2020. No clear source of bleeding identified, however inflammation was noted in the stomach. Follows with Dr. Willy Harvest.  -- Today we ordered iron  panel and ferritin as well as reticulocytes, CBC, and CMP -- Unable to tolerate p.o. iron  therapy due to severe constipation. -- Patient underwent 5 doses of IV iron  sucrose 200 mg from 08/26/2021 to 09/23/2021 --Labs today show white blood cell count 9.2, Hgb 12.8, MCV 88.2, Plt 314. Ferritin 243, Sat 28%  --Will plan to proceed with IV iron  if she is persistently iron  deficient.  --RTC in 12 months with labs in 6 months time    No orders of the defined types were placed in this encounter.   All questions were answered. The patient knows to call the clinic with any problems, questions or concerns.  A total of more than 30 minutes were spent on this encounter with face-to-face time and non-face-to-face time, including preparing to see the patient, ordering tests and/or medications, counseling the patient and coordination of care as outlined above.   Rogerio Clay, MD Department of Hematology/Oncology [facility] Phone: [phone] Pager: [phone] Email: [email or web address]  08/14/2023 4:06 PM

## 2023-08-18 ENCOUNTER — Ambulatory Visit
Admission: EM | Admit: 2023-08-18 | Discharge: 2023-08-18 | Disposition: A | Discharge status: Home / Self Care | Attending: Family Medicine | Admitting: Family Medicine

## 2023-08-18 DIAGNOSIS — S7011XA Contusion of right thigh, initial encounter: Secondary | ICD-10-CM

## 2023-08-18 DIAGNOSIS — W19XXXA Unspecified fall, initial encounter: Secondary | ICD-10-CM

## 2023-08-18 DIAGNOSIS — R58 Hemorrhage, not elsewhere classified: Secondary | ICD-10-CM | POA: Diagnosis not present

## 2023-08-18 MED ORDER — DICLOFENAC SODIUM 1 % EX GEL: 2.0000 g | Freq: Three times a day (TID) | CUTANEOUS | 0 refills | Status: AC | PRN

## 2023-08-18 NOTE — ED Triage Notes (Signed)
 Patient states she fell x 1 week ago. Patient states the right leg has been hurting she has a bubble like bump on her right leg.

## 2023-08-18 NOTE — ED Provider Notes (Signed)
 [facility]  Note:  This document was prepared using Conservation officer, historic buildings and may include unintentional dictation errors.  MRN: [id number] DOB: 12-08-1950  Subjective:   Angie Olson is a 73 y.o. female presenting for 1 week history of persistent right thigh pain.  Patient reports that she fell accidentally while outdoors.  She observed mostly impacted to her the back of her thigh while falling on a couple of tree stumps.  Has since had some bruising, pain and swelling.  No open wounds, fever, drainage of pus or bleeding.  Has not used any medications for relief.  Prefers to not take any.  No current facility-administered medications for this encounter.  Current Outpatient Medications:    alendronate (FOSAMAX) 70 MG tablet, Take 70 mg by mouth once a week., Disp: , Rfl:    ALPRAZolam  (XANAX ) 1 MG tablet, Take 1 mg by mouth at bedtime., Disp: , Rfl:    amLODipine  (NORVASC ) 10 MG tablet, Take 10 mg by mouth daily., Disp: , Rfl:    atorvastatin  (LIPITOR) 20 MG tablet, Take 20 mg by mouth in the morning., Disp: , Rfl:    Calcium  Carbonate-Vitamin D  (CALCIUM -VITAMIN D3 PO), Take 1 tablet by mouth daily., Disp: , Rfl:    cetirizine  (ZYRTEC ) 10 MG tablet, Take 10 mg by mouth in the morning., Disp: , Rfl:    diclofenac  Sodium (VOLTAREN ) 1 % GEL, Apply 1 Application topically as needed (pain)., Disp: , Rfl:    gabapentin  (NEURONTIN ) 300 MG capsule, Take 600 mg by mouth 2 (two) times daily., Disp: , Rfl:    leflunomide  (ARAVA ) 20 MG tablet, Take 20 mg by mouth in the morning., Disp: , Rfl:    lisinopril  (PRINIVIL ,ZESTRIL ) 40 MG tablet, Take 40 mg by mouth in the morning., Disp: , Rfl:    Multiple Vitamin (MULTI VITAMIN) TABS, 1 tablet Orally Once a day, Disp: , Rfl:    Multiple Vitamins-Minerals (HAIR/SKIN/NAILS) CAPS, Take 1 tablet by mouth daily., Disp: , Rfl:    Multiple Vitamins-Minerals (MULTIVITAMIN WITH MINERALS) tablet, Take 1 tablet by mouth daily.,  Disp: , Rfl:    nirmatrelvir & ritonavir  (PAXLOVID , 300/100,) 20 x 150 MG & 10 x 100MG  TBPK, Take 2 tablets nirmtrelvir and 1 tablet ritonavir twice daily., Disp: 30 tablet, Rfl: 0   nortriptyline  (PAMELOR ) 25 MG capsule, Take 25 mg by mouth at bedtime. , Disp: , Rfl: 2   acetaminophen  (TYLENOL ) 500 MG tablet, Take 1,000 mg by mouth as needed for moderate pain., Disp: , Rfl:    benzonatate (TESSALON) 100 MG capsule, Take 100 mg by mouth 3 (three) times daily as needed for cough., Disp: , Rfl:    Capsaicin -Menthol  (SALONPAS GEL-PATCH HOT) 0.025-1.25 % PTCH, Apply 1 patch topically daily as needed (pain)., Disp: , Rfl:    cholecalciferol  (VITAMIN D3) 25 MCG (1000 UNIT) tablet, Take 2,000 Units by mouth daily., Disp: , Rfl:    esomeprazole  (NEXIUM ) 20 MG capsule, Take 20 mg by mouth daily before breakfast. , Disp: , Rfl:    hydroxychloroquine  (PLAQUENIL ) 200 MG tablet, Take 200 mg by mouth in the morning., Disp: , Rfl:    ibuprofen  (ADVIL ) 200 MG tablet, Take 400 mg by mouth as needed for moderate pain., Disp: , Rfl:    predniSONE  (DELTASONE ) 20 MG tablet, Take 2 tablets daily with breakfast., Disp: 10 tablet, Rfl: 0   predniSONE  (DELTASONE ) 5 MG tablet, Take 1 tablet (5 mg total) by mouth daily with breakfast., Disp: , Rfl:  PRESCRIPTION MEDICATION, Place 1 drop into both eyes in the morning, at noon, in the evening, and at bedtime. Unknown eye drop, Disp: , Rfl:    promethazine -dextromethorphan (PROMETHAZINE -DM) 6.25-15 MG/5ML syrup, Take 5 mLs by mouth 3 (three) times daily as needed for cough., Disp: 200 mL, Rfl: 0   sucralfate  (CARAFATE ) 1 G tablet, Take 1 g by mouth as needed (indigestion)., Disp: , Rfl:    Allergies  Allergen Reactions   Hydrocodone -Acetaminophen  Other (See Comments)        "Makes me feel weird"   Methotrexate Derivatives Other (See Comments)    Causes recurrent infections    Metoclopramide  Other (See Comments)    insomnia     Pseudoephedrine Other (See Comments)     Makes jittery     Vicodin [Hydrocodone -Acetaminophen ] Other (See Comments)    "Makes me feel weird"   Molds & Smuts Other (See Comments)    Congestion    Other Other (See Comments)    Pollen / Closes Sinus    Oxycodone  Itching and Rash    Patient tolerated hydromorphone  without reaction.    Past Medical History:  Diagnosis Date   Acid reflux    AKI (acute kidney injury) (HCC) 07/18/2014   Allergic rhinitis    anesthetic complication    Respiratory arrest 01-27-19  Anxiety    PT'S SON DIED 27-Aug-2010, husband died in 03/12/2021Mother died in 2019-08-27  Arthritis    RA AND OA   Borderline hypercholesterolemia    on medication for TIA   Bronchitis    CHRONIC    Cancer (HCC)    melanoma - face    Carpal tunnel syndrome 04/01/2022   Closed fracture of single ramus of right pubis (HCC) 04/01/2022   Colitis 07/19/2014   Colon polyp    COVID-19 virus infection 02/17/2019   01-27-19  Environmental allergies    Epidural hematoma (HCC) 01/11/2014   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   GERD 12/07/2006   Qualifier: Diagnosis of   By: Dene Fines CMA, Jacqualynn       GERD (gastroesophageal reflux disease)    on PPI and carafte prn   H/O hiatal hernia    History of COVID-19 2019-01-27   History of kidney stones    Hypertension    IBS (irritable bowel syndrome)    Iron  deficiency anemia 08/06/2021   Iron  deficiency anemia due to chronic blood loss 08/21/2021   Irritable bowel syndrome 01/03/2020   LBP (low back pain)    Nosocomial pneumonia 02/07/2019   OA (osteoarthritis)    Left Shoulder   Pain    RIGHT KNEE - TORN MENISCUS AND ACL   Pneumonia    X 3  - LAST TIME WAS 08-27-2011   PONV (postoperative nausea and vomiting) 09/2012   sore throat after ankle surgery 6 yrs ago and severe ponv 09-2012   Pre-diabetes    patient denies - states it was "years ago and no follow up"   Pulmonary nodule seen on imaging study    Shortness of breath    with  activity,bronchitis   Stroke (HCC) 03/2011   TIA--PT EXPERIENCED NUMBNESS RT HAND AND ARM , HEADACHE AND NAUSEA. NO RESIDUAL PROBLEMS   Thrombocytosis 02/06/2019     Past Surgical History:  Procedure Laterality Date   ANKLE SURGERY  ~10 years ago   left   APPLICATION OF INTRAOPERATIVE CT SCAN N/A 03/27/2021   Procedure: APPLICATION OF INTRAOPERATIVE CT SCAN;  Surgeon: Gearl Keens, MD;  Location: [facility] OR;  Service: Neurosurgery;  Laterality: N/A;   BACK SURGERY  2011   lower   CARPAL TUNNEL RELEASE Right 2021   CHOLECYSTECTOMY  ~15 years ago   COLONOSCOPY  2005,2009, 2013   COLONOSCOPY W/ POLYPECTOMY  10/23/2020   Small adenoma   HARDWARE REMOVAL N/A 04/07/2016   Procedure: HARDWARE REMOVAL;  Surgeon: Gearl Keens, MD;  Location: [facility] OR;  Service: Neurosurgery;  Laterality: N/A;   HARDWARE REMOVAL N/A 08/01/2020   Procedure: Removal of hardware with cutting the rod below the Lumbar one screws and removal of bilateral Lumbar four screws;  Surgeon: Gearl Keens, MD;  Location: [facility] OR;  Service: Neurosurgery;  Laterality: N/A;   HARVEST BONE GRAFT N/A 07/05/2014   Procedure: HARVEST ILIAC BONE GRAFT;  Surgeon: Gearl Keens, MD;  Location: [facility];  Service: Neurosurgery;  Laterality: N/A;   KNEE ARTHROSCOPY Right 10/13/2012   Procedure: RIGHT KNEE ARTHROSCOPY WITH DEBRIDEMENT, CHONDROPLASTY;  Surgeon: Aurther Blue, MD;  Location: [facility];  Service: Orthopedics;  Laterality: Right;   KNEE ARTHROSCOPY Right 03/02/2013   Procedure: RIGHT ARTHROSCOPY KNEE WITH MEDIAL MENISCAL  DEBRIDEMENT;  Surgeon: Aurther Blue, MD;  Location: [facility];  Service: Orthopedics;  Laterality: Right;   LAMINECTOMY WITH POSTERIOR LATERAL ARTHRODESIS LEVEL 1 N/A 08/01/2020   Procedure: posterolateral and Situ fusion;  Surgeon: Gearl Keens, MD;  Location: [facility] OR;  Service: Neurosurgery;  Laterality: N/A;   LAMINECTOMY WITH POSTERIOR LATERAL ARTHRODESIS LEVEL 1 N/A 04/01/2021   Procedure: REEXPLORATION OF LUMBAR FUSION FOR  REMOVAL AND REPLACEMENT OF MALPOSITIONED RIGHT SACRAL ONE PEDICLE SCREW;  Surgeon: Gearl Keens, MD;  Location: [facility] OR;  Service: Neurosurgery;  Laterality: N/A;   LAMINECTOMY WITH POSTERIOR LATERAL ARTHRODESIS LEVEL 4 N/A 01/23/2020   Procedure: Posterior lateral fusion - Thoracic Ten -Thoracic Eleven - Thoracic Eleven -Thoracic Twelve - Thoracic Twelve- Lumbar One - Lumbar One- Lumbar Two revision with removal globus;  Surgeon: Gearl Keens, MD;  Location: [facility] OR;  Service: Neurosurgery;  Laterality: N/A;  Eleven - Thoracic Eleven -Thoracic Twelve - Thoracic Twelve- Lumbar One - Lumbar One- Lumbar Two revision with removal globus   LAMINECTOMY WITH POSTERIOR LATERAL ARTHRODESIS LEVEL 4 N/A 03/27/2021   Procedure: Posterior lateral fusion - Lumbar two-Lumbar three - Lumbar three-Lumbar four - Lumbar four-Lumbar five - Lumbar five-Sacral one - Pelvis with navigation Airo;  Surgeon: Gearl Keens, MD;  Location: [facility] OR;  Service: Neurosurgery;  Laterality: N/A;   LUMBAR FUSION  07/05/2014   LUMBAR WOUND DEBRIDEMENT N/A 01/11/2014   Procedure: Irrigation and Debridement Lumbar Wound for hematoma;  Surgeon: Ferris Hua, MD;  Location: [facility];  Service: Neurosurgery;  Laterality: N/A;   MAXIMUM ACCESS (MAS)POSTERIOR LUMBAR INTERBODY FUSION (PLIF) 1 LEVEL N/A 12/05/2013   Procedure: FOR MAXIMUM ACCESS (MAS) POSTERIOR LUMBAR INTERBODY FUSION (PLIF)LUMBAR FIVE-SACRAL-ONE,REMOVAL HARDWARE LUMBAR FOUR-FIVE;  Surgeon: Ferris Hua, MD;  Location: [facility];  Service: Neurosurgery;  Laterality: N/A;   Mohr's procedure  02/2015   face   Right Hip arthroplasty     10/01/16 Dr. France Ina   ROOT CANAL     SHOULDER SURGERY Left ~14 years ago   "scope"   thumb surgery Left ~14 years ago   x2   TOTAL HIP ARTHROPLASTY Left 04/25/2015   Procedure: LEFT TOTAL HIP ARTHROPLASTY ANTERIOR APPROACH;  Surgeon: Liliane Rei, MD;  Location: [facility];  Service: Orthopedics;  Laterality: Left;   TOTAL HIP ARTHROPLASTY Right  10/01/2016  Procedure: RIGHT TOTAL HIP ARTHROPLASTY ANTERIOR APPROACH;  Surgeon: Liliane Rei, MD;  Location: [facility];  Service: Orthopedics;  Laterality: Right;   TOTAL KNEE ARTHROPLASTY Right 07/25/2013   Procedure: RIGHT TOTAL KNEE ARTHROPLASTY;  Surgeon: Aurther Blue, MD;  Location: [facility];  Service: Orthopedics;  Laterality: Right;   TOTAL KNEE ARTHROPLASTY WITH REVISION COMPONENTS Right 05/28/2016   Procedure: RIGHT TIBIAL POLYETHYLENE KNEE REVISION;  Surgeon: Liliane Rei, MD;  Location: [facility];  Service: Orthopedics;  Laterality: Right;  requests   TRIGGER FINGER RELEASE  ~6 years ago   X2 ON RIGHT HAND   TUBAL LIGATION      Family History  Problem Relation Age of Onset   Heart disease Sister    Lung cancer Brother    Heart disease Brother    CAD Brother    Heart attack Mother    Emphysema Father    Hypertension Daughter    Colon cancer Neg Hx     Social History   Tobacco Use   Smoking status: Never   Smokeless tobacco: Never  Vaping Use   Vaping status: Never Used  Substance Use Topics   Alcohol use: No   Drug use: No    ROS   Objective:   Vitals: BP (!) 160/89 (BP Location: Right Arm)   Pulse 80   Temp 98.4 F (36.9 C) (Oral)   Resp 18   SpO2 96%   Physical Exam Constitutional:      General: She is not in acute distress.    Appearance: Normal appearance. She is well-developed. She is not ill-appearing, toxic-appearing or diaphoretic.  HENT:     Head: Normocephalic and atraumatic.     Nose: Nose normal.     Mouth/Throat:     Mouth: Mucous membranes are moist.  Eyes:     General: No scleral icterus.       Right eye: No discharge.        Left eye: No discharge.     Extraocular Movements: Extraocular movements intact.  Cardiovascular:     Rate and Rhythm: Normal rate.  Pulmonary:     Effort: Pulmonary effort is normal.  Musculoskeletal:       Legs:     Comments: Ambulates without assistance at her expected pace.  Skin:     General: Skin is warm and dry.  Neurological:     General: No focal deficit present.     Mental Status: She is alert and oriented to person, place, and time.  Psychiatric:        Mood and Affect: Mood normal.        Behavior: Behavior normal.      Assessment and Plan :   PDMP not reviewed this encounter.  1. Hematoma of right thigh, initial encounter   2. Ecchymosis   3. Accidental fall, initial encounter    Anticipatory guidance provided for hematoma.  Recommended topical diclofenac  gel.  Counseled patient on potential for adverse effects with medications prescribed/recommended today, ER and return-to-clinic precautions discussed, patient verbalized understanding.    Adolph Hoop, [place] 08/18/23 1410

## 2023-09-25 ENCOUNTER — Other Ambulatory Visit (HOSPITAL_COMMUNITY): Payer: Self-pay | Admitting: Internal Medicine

## 2023-09-25 DIAGNOSIS — R011 Cardiac murmur, unspecified: Secondary | ICD-10-CM

## 2023-10-13 ENCOUNTER — Ambulatory Visit (HOSPITAL_COMMUNITY)
Admission: RE | Admit: 2023-10-13 | Discharge: 2023-10-13 | Disposition: A | Discharge status: Home / Self Care | Source: Ambulatory Visit | Attending: Internal Medicine | Admitting: Internal Medicine

## 2023-10-13 DIAGNOSIS — I080 Rheumatic disorders of both mitral and aortic valves: Secondary | ICD-10-CM | POA: Diagnosis not present

## 2023-10-13 DIAGNOSIS — R011 Cardiac murmur, unspecified: Secondary | ICD-10-CM | POA: Diagnosis present

## 2023-10-13 LAB — ECHOCARDIOGRAM COMPLETE
Area-P 1/2: 5.84 cm2
Calc EF: 60.6 %
S' Lateral: 2.6 cm
Single Plane A2C EF: 54.4 %
Single Plane A4C EF: 64 %

## 2024-02-15 ENCOUNTER — Inpatient Hospital Stay: Attending: Hematology and Oncology

## 2024-02-15 DIAGNOSIS — D509 Iron deficiency anemia, unspecified: Secondary | ICD-10-CM | POA: Insufficient documentation

## 2024-02-15 DIAGNOSIS — D500 Iron deficiency anemia secondary to blood loss (chronic): Secondary | ICD-10-CM

## 2024-02-15 DIAGNOSIS — K922 Gastrointestinal hemorrhage, unspecified: Secondary | ICD-10-CM | POA: Diagnosis not present

## 2024-02-15 LAB — CMP (CANCER CENTER ONLY)
ALT: 45 U/L — ABNORMAL HIGH (ref 0–44)
AST: 20 U/L (ref 15–41)
Albumin: 4.5 g/dL (ref 3.5–5.0)
Alkaline Phosphatase: 116 U/L (ref 38–126)
Anion gap: 9 (ref 5–15)
BUN: 17 mg/dL (ref 8–23)
CO2: 27 mmol/L (ref 22–32)
Calcium: 9.8 mg/dL (ref 8.9–10.3)
Chloride: 106 mmol/L (ref 98–111)
Creatinine: 0.77 mg/dL (ref 0.44–1.00)
GFR, Estimated: >60 mL/min (ref >60)
Glucose, Bld: 115 mg/dL — ABNORMAL HIGH (ref 70–99)
Potassium: 4.3 mmol/L (ref 3.5–5.1)
Sodium: 141 mmol/L (ref 135–145)
Total Bilirubin: 0.2 mg/dL (ref 0.0–1.2)
Total Protein: 6.7 g/dL (ref 6.5–8.1)

## 2024-02-15 LAB — RETIC PANEL
Immature Retic Fract: 2.7 % (ref 2.3–15.9)
RBC.: 4.48 MIL/uL (ref 3.87–5.11)
Retic Count, Absolute: 49.7 K/uL (ref 19.0–186.0)
Retic Ct Pct: 1.1 % (ref 0.4–3.1)
Reticulocyte Hemoglobin: 33.5 pg (ref >27.9)

## 2024-02-15 LAB — CBC WITH DIFFERENTIAL (CANCER CENTER ONLY)
Abs Immature Granulocytes: 0.01 K/uL (ref 0.00–0.07)
Basophils Absolute: 0 K/uL (ref 0.0–0.1)
Basophils Relative: 0 %
Eosinophils Absolute: 0 K/uL (ref 0.0–0.5)
Eosinophils Relative: 1 %
HCT: 39.6 % (ref 36.0–46.0)
Hemoglobin: 13.2 g/dL (ref 12.0–15.0)
Immature Granulocytes: 0 %
Lymphocytes Relative: 14 %
Lymphs Abs: 0.9 K/uL (ref 0.7–4.0)
MCH: 29.7 pg (ref 26.0–34.0)
MCHC: 33.3 g/dL (ref 30.0–36.0)
MCV: 89 fL (ref 80.0–100.0)
Monocytes Absolute: 0.3 K/uL (ref 0.1–1.0)
Monocytes Relative: 4 %
Neutro Abs: 5.4 K/uL (ref 1.7–7.7)
Neutrophils Relative %: 81 %
Platelet Count: 305 K/uL (ref 150–400)
RBC: 4.45 MIL/uL (ref 3.87–5.11)
RDW: 13.1 % (ref 11.5–15.5)
WBC Count: 6.7 K/uL (ref 4.0–10.5)
nRBC: 0 % (ref 0.0–0.2)

## 2024-02-15 LAB — IRON AND IRON BINDING CAPACITY (CC-WL,HP ONLY)
Iron: 44 ug/dL (ref 28–170)
Saturation Ratios: 15 % (ref 10.4–31.8)
TIBC: 290 ug/dL (ref 250–450)
UIBC: 245 ug/dL

## 2024-02-15 LAB — FERRITIN: Ferritin: 264 ng/mL (ref 11–307)

## 2024-08-15 ENCOUNTER — Other Ambulatory Visit

## 2024-08-15 ENCOUNTER — Ambulatory Visit: Admitting: Hematology and Oncology
